# Patient Record
Sex: Female | Born: 1980
Health system: Southern US, Community
[De-identification: ages and names within clinical notes are randomized; demographics above are authoritative.]

## PROBLEM LIST (undated history)

## (undated) DIAGNOSIS — F32A Depression, unspecified: Secondary | ICD-10-CM

## (undated) DIAGNOSIS — E039 Hypothyroidism, unspecified: Secondary | ICD-10-CM

## (undated) DIAGNOSIS — D649 Anemia, unspecified: Secondary | ICD-10-CM

## (undated) DIAGNOSIS — Z86711 Personal history of pulmonary embolism: Secondary | ICD-10-CM

## (undated) DIAGNOSIS — F429 Obsessive-compulsive disorder, unspecified: Secondary | ICD-10-CM

## (undated) DIAGNOSIS — Z862 Personal history of diseases of the blood and blood-forming organs and certain disorders involving the immune mechanism: Secondary | ICD-10-CM

## (undated) DIAGNOSIS — F4310 Post-traumatic stress disorder, unspecified: Secondary | ICD-10-CM

## (undated) DIAGNOSIS — F419 Anxiety disorder, unspecified: Secondary | ICD-10-CM

## (undated) DIAGNOSIS — B019 Varicella without complication: Secondary | ICD-10-CM

## (undated) DIAGNOSIS — R011 Cardiac murmur, unspecified: Secondary | ICD-10-CM

## (undated) DIAGNOSIS — F329 Major depressive disorder, single episode, unspecified: Secondary | ICD-10-CM

## (undated) DIAGNOSIS — F509 Eating disorder, unspecified: Secondary | ICD-10-CM

## (undated) HISTORY — DX: Eating disorder, unspecified: F50.9

## (undated) HISTORY — PX: EXCISION OF ENDOMETRIOMA: SHX6473

## (undated) HISTORY — DX: Post-traumatic stress disorder, unspecified: F43.10

## (undated) HISTORY — DX: Depression, unspecified: F32.A

## (undated) HISTORY — DX: Varicella without complication: B01.9

## (undated) HISTORY — DX: Major depressive disorder, single episode, unspecified: F32.9

## (undated) HISTORY — DX: Personal history of diseases of the blood and blood-forming organs and certain disorders involving the immune mechanism: Z86.2

## (undated) HISTORY — DX: Anxiety disorder, unspecified: F41.9

## (undated) HISTORY — DX: Hypothyroidism, unspecified: E03.9

## (undated) HISTORY — DX: Anemia, unspecified: D64.9

## (undated) HISTORY — DX: Obsessive-compulsive disorder, unspecified: F42.9

## (undated) HISTORY — DX: Personal history of pulmonary embolism: Z86.711

---

## 2000-09-28 DIAGNOSIS — Z86711 Personal history of pulmonary embolism: Secondary | ICD-10-CM

## 2000-09-28 HISTORY — DX: Personal history of pulmonary embolism: Z86.711

## 2001-09-28 HISTORY — PX: CHOLECYSTECTOMY: SHX55

## 2001-09-28 HISTORY — PX: GASTRIC BYPASS: SHX52

## 2014-12-14 DIAGNOSIS — E282 Polycystic ovarian syndrome: Secondary | ICD-10-CM | POA: Insufficient documentation

## 2015-01-08 DIAGNOSIS — Z0000 Encounter for general adult medical examination without abnormal findings: Secondary | ICD-10-CM | POA: Insufficient documentation

## 2016-03-20 ENCOUNTER — Encounter: Payer: Self-pay | Admitting: Family Medicine

## 2016-03-20 ENCOUNTER — Encounter (INDEPENDENT_AMBULATORY_CARE_PROVIDER_SITE_OTHER): Payer: Self-pay

## 2016-03-20 ENCOUNTER — Ambulatory Visit (INDEPENDENT_AMBULATORY_CARE_PROVIDER_SITE_OTHER): Payer: BC Managed Care – PPO | Admitting: Family Medicine

## 2016-03-20 VITALS — BP 126/62 | HR 80 | Temp 98.4°F | Ht 64.0 in

## 2016-03-20 DIAGNOSIS — F509 Eating disorder, unspecified: Secondary | ICD-10-CM | POA: Diagnosis not present

## 2016-03-20 DIAGNOSIS — Z8639 Personal history of other endocrine, nutritional and metabolic disease: Secondary | ICD-10-CM | POA: Insufficient documentation

## 2016-03-20 DIAGNOSIS — Z8659 Personal history of other mental and behavioral disorders: Secondary | ICD-10-CM | POA: Insufficient documentation

## 2016-03-20 DIAGNOSIS — F4310 Post-traumatic stress disorder, unspecified: Secondary | ICD-10-CM

## 2016-03-20 DIAGNOSIS — Z9884 Bariatric surgery status: Secondary | ICD-10-CM | POA: Diagnosis not present

## 2016-03-20 DIAGNOSIS — E039 Hypothyroidism, unspecified: Secondary | ICD-10-CM | POA: Insufficient documentation

## 2016-03-20 DIAGNOSIS — G4700 Insomnia, unspecified: Secondary | ICD-10-CM | POA: Diagnosis not present

## 2016-03-20 LAB — VITAMIN B12: VITAMIN B 12: 185 pg/mL — AB (ref 211–911)

## 2016-03-20 LAB — IBC PANEL
Iron: 48 ug/dL (ref 42–145)
SATURATION RATIOS: 12.3 % — AB (ref 20.0–50.0)
Transferrin: 279 mg/dL (ref 212.0–360.0)

## 2016-03-20 LAB — LIPID PANEL
CHOLESTEROL: 164 mg/dL (ref 0–200)
HDL: 56.9 mg/dL (ref >39.00)
LDL Cholesterol: 95 mg/dL (ref 0–99)
NonHDL: 107.38
TRIGLYCERIDES: 61 mg/dL (ref 0.0–149.0)
Total CHOL/HDL Ratio: 3
VLDL: 12.2 mg/dL (ref 0.0–40.0)

## 2016-03-20 LAB — CBC
HEMATOCRIT: 36.3 % (ref 36.0–46.0)
HEMOGLOBIN: 12.2 g/dL (ref 12.0–15.0)
MCHC: 33.5 g/dL (ref 30.0–36.0)
MCV: 83.6 fl (ref 78.0–100.0)
PLATELETS: 279 10*3/uL (ref 150.0–400.0)
RBC: 4.34 Mil/uL (ref 3.87–5.11)
RDW: 14.1 % (ref 11.5–15.5)
WBC: 6.6 10*3/uL (ref 4.0–10.5)

## 2016-03-20 LAB — COMPREHENSIVE METABOLIC PANEL
ALBUMIN: 4 g/dL (ref 3.5–5.2)
ALT: 10 U/L (ref 0–35)
AST: 11 U/L (ref 0–37)
Alkaline Phosphatase: 58 U/L (ref 39–117)
BUN: 14 mg/dL (ref 6–23)
CALCIUM: 9.1 mg/dL (ref 8.4–10.5)
CHLORIDE: 110 meq/L (ref 96–112)
CO2: 29 mEq/L (ref 19–32)
CREATININE: 0.67 mg/dL (ref 0.40–1.20)
GFR: 105.85 mL/min (ref >60.00)
Glucose, Bld: 87 mg/dL (ref 70–99)
Potassium: 4 mEq/L (ref 3.5–5.1)
Sodium: 143 mEq/L (ref 135–145)
Total Bilirubin: 0.4 mg/dL (ref 0.2–1.2)
Total Protein: 6 g/dL (ref 6.0–8.3)

## 2016-03-20 LAB — T3, FREE: T3, Free: 3.6 pg/mL (ref 2.3–4.2)

## 2016-03-20 LAB — T4, FREE: Free T4: 0.84 ng/dL (ref 0.60–1.60)

## 2016-03-20 LAB — FOLATE: Folate: >23.7 ng/mL (ref >5.9)

## 2016-03-20 LAB — VITAMIN D 25 HYDROXY (VIT D DEFICIENCY, FRACTURES): VITD: 15.67 ng/mL — AB (ref 30.00–100.00)

## 2016-03-20 LAB — FERRITIN: Ferritin: 7.9 ng/mL — ABNORMAL LOW (ref 10.0–291.0)

## 2016-03-20 LAB — HEMOGLOBIN A1C: HEMOGLOBIN A1C: 5.3 % (ref 4.6–6.5)

## 2016-03-20 LAB — TSH: TSH: 9.85 u[IU]/mL — AB (ref 0.35–4.50)

## 2016-03-20 MED ORDER — SERTRALINE HCL 100 MG PO TABS: 300.0000 mg | ORAL_TABLET | Freq: Every day | ORAL | Status: DC

## 2016-03-20 MED ORDER — TRAZODONE HCL 50 MG PO TABS: 50.0000 mg | ORAL_TABLET | Freq: Every evening | ORAL | Status: DC | PRN

## 2016-03-20 MED ORDER — CLONAZEPAM 0.5 MG PO TABS: 0.5000 mg | ORAL_TABLET | Freq: Two times a day (BID) | ORAL | Status: DC | PRN

## 2016-03-20 NOTE — Assessment & Plan Note (Signed)
Unclear diagnosis. Patient appears to have had issues with restrictive eating, binge eating, and binging and purging previously. She refused weight today. Patient was placed on Zoloft 300 mg daily by a psychiatrist.  She feels like she is doing well at this time on the Zoloft. I discussed seeing a nutritionist who has a great deal clinical experience with eating disorders. She declined this time. She will continue to see her therapist.
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
## 2016-03-20 NOTE — Patient Instructions (Signed)
Take the medications as we discussed.  We will call regarding your labs.  Follow up in 3 months.  Take care  Dr. Adriana Simasook

## 2016-03-20 NOTE — Assessment & Plan Note (Signed)
Likely secondary to anxiety/PTSD. Starting patient on trazodone.

## 2016-03-20 NOTE — Assessment & Plan Note (Signed)
Patient having considerable anxiety this point in time. Adding Klonopin 0.5 mg twice a day PRN.

## 2016-03-21 LAB — IRON AND TIBC
%SAT: 11 % (ref 11–50)
Iron: 38 ug/dL — ABNORMAL LOW (ref 40–190)
TIBC: 332 ug/dL (ref 250–450)
UIBC: 294 ug/dL (ref 125–400)

## 2016-03-23 ENCOUNTER — Other Ambulatory Visit: Payer: Self-pay

## 2016-03-23 ENCOUNTER — Other Ambulatory Visit: Payer: Self-pay | Admitting: Family Medicine

## 2016-03-23 DIAGNOSIS — D509 Iron deficiency anemia, unspecified: Secondary | ICD-10-CM

## 2016-03-23 MED ORDER — VITAMIN D (ERGOCALCIFEROL) 1.25 MG (50000 UNIT) PO CAPS: 50000.0000 [IU] | ORAL_CAPSULE | ORAL | Status: DC

## 2016-03-23 MED ORDER — FERROUS SULFATE 324 (65 FE) MG PO TBEC: DELAYED_RELEASE_TABLET | ORAL | Status: DC

## 2016-03-24 ENCOUNTER — Encounter: Payer: Self-pay | Admitting: Family Medicine

## 2016-03-24 LAB — VITAMIN B1: Vitamin B1 (Thiamine): 8 nmol/L (ref 8–30)

## 2016-03-25 NOTE — Telephone Encounter (Signed)
Hematology is scheduled through oncology at the cancer center.

## 2016-03-25 NOTE — Telephone Encounter (Signed)
Have you seen this come across. i know the Oncology one is in there but have you seen hematology?

## 2016-04-06 ENCOUNTER — Ambulatory Visit (INDEPENDENT_AMBULATORY_CARE_PROVIDER_SITE_OTHER): Payer: BC Managed Care – PPO | Admitting: Family Medicine

## 2016-04-06 ENCOUNTER — Encounter: Payer: Self-pay | Admitting: Family Medicine

## 2016-04-06 VITALS — BP 130/82 | HR 73 | Temp 98.5°F

## 2016-04-06 DIAGNOSIS — E538 Deficiency of other specified B group vitamins: Secondary | ICD-10-CM

## 2016-04-06 DIAGNOSIS — E860 Dehydration: Secondary | ICD-10-CM | POA: Insufficient documentation

## 2016-04-06 MED ORDER — ONDANSETRON HCL 4 MG PO TABS: 4.0000 mg | ORAL_TABLET | Freq: Three times a day (TID) | ORAL | Status: DC | PRN

## 2016-04-06 NOTE — Patient Instructions (Signed)
Hydrate, hydrate, hydrate.  Use the zofran for nausea.  Your exam was normal.  Follow up as directed or sooner if needed  Take care  Dr. Adriana Simasook

## 2016-04-06 NOTE — Progress Notes (Signed)
   Subjective:  Patient ID: Enrigue CatenaKristen Heindl, female    DOB: 30-Nov-1980  Age: 35 y.o. MRN: [id number]  CC: Neck pain, Sore throat, Body aches, Diarrhea, Nausea  HPI:  35 year old female with a past medical history of eating disorder presents with the above complaints. Patient states that her symptoms started on Thursday. Initially began with neck pain. She has now developed nausea, diarrhea, body aches, sore throat/swollen glands. No associated fevers or chills. No URI symptoms. No known exacerbating or relieving factors. No reported sick contacts. No other complaints at this time.  Social Hx   Social History   Social History  . Marital Status: Single    Spouse Name: N/A  . Number of Children: N/A  . Years of Education: N/A   Social History Main Topics  . Smoking status: Never Smoker   . Smokeless tobacco: Never Used  . Alcohol Use: 0.0 - 0.6 oz/week    0-1 Standard drinks or equivalent per week  . Drug Use: No  . Sexual Activity: Not Asked   Other Topics Concern  . None   Social History Narrative   Review of Systems  Constitutional: Negative for fever.  Gastrointestinal: Positive for nausea and diarrhea.  Musculoskeletal: Positive for myalgias and neck pain.   Objective:  BP 130/82 mmHg  Pulse 73  Temp(Src) 98.5 F (36.9 C) (Oral)  SpO2 97% Weight taken today (not reported as this is visible to patient and she has issues with weight/eating disorder).  BP/Weight 04/06/2016 03/20/2016  Systolic BP 130 126  Diastolic BP 82 62  Wt. (Lbs) - -   Physical Exam  Constitutional: She appears well-developed. No distress.  Appears fatigued.  HENT:  Dry mucous membranes.  Cardiovascular: Normal rate and regular rhythm.   Pulmonary/Chest: Effort normal. She has no wheezes. She has no rales.  Abdominal: Soft. She exhibits no distension. There is no tenderness.  Vitals reviewed.  Lab Results  Component Value Date   WBC 6.6 03/20/2016   HGB 12.2 03/20/2016   HCT 36.3  03/20/2016   PLT 279.0 03/20/2016   GLUCOSE 87 03/20/2016   CHOL 164 03/20/2016   TRIG 61.0 03/20/2016   HDL 56.90 03/20/2016   LDLCALC 95 03/20/2016   ALT 10 03/20/2016   AST 11 03/20/2016   NA 143 03/20/2016   K 4.0 03/20/2016   CL 110 03/20/2016   CREATININE 0.67 03/20/2016   BUN 14 03/20/2016   CO2 29 03/20/2016   TSH 9.85* 03/20/2016   HGBA1C 5.3 03/20/2016   Assessment & Plan:   Problem List Items Addressed This Visit    Dehydration - Primary    New problem. Patient with recent illness (several complaints). Most recently experiencing diarrhea and nausea. Clinically dehydrated today. Other than dry mucous membranes exam unrevealing. BMP today. Supportive care. Zofran for nausea. Increase fluid intake.      Relevant Orders   Basic Metabolic Panel (BMET)      Follow-up: PRN  Everlene OtherJayce Khadejah Son DO [facility]

## 2016-04-06 NOTE — Assessment & Plan Note (Signed)
New problem. Patient with recent illness (several complaints). Most recently experiencing diarrhea and nausea. Clinically dehydrated today. Other than dry mucous membranes exam unrevealing. BMP today. Supportive care. Zofran for nausea. Increase fluid intake.

## 2016-04-06 NOTE — Progress Notes (Signed)
Pre visit review using our clinic review tool, if applicable. No additional management support is needed unless otherwise documented below in the visit note. 

## 2016-04-07 DIAGNOSIS — E538 Deficiency of other specified B group vitamins: Secondary | ICD-10-CM | POA: Diagnosis not present

## 2016-04-07 LAB — BASIC METABOLIC PANEL
BUN: 15 mg/dL (ref 6–23)
CO2: 27 mEq/L (ref 19–32)
CREATININE: 0.66 mg/dL (ref 0.40–1.20)
Calcium: 9.6 mg/dL (ref 8.4–10.5)
Chloride: 106 mEq/L (ref 96–112)
GFR: 107.67 mL/min (ref >60.00)
Glucose, Bld: 82 mg/dL (ref 70–99)
POTASSIUM: 4.4 meq/L (ref 3.5–5.1)
Sodium: 140 mEq/L (ref 135–145)

## 2016-04-07 MED ORDER — CYANOCOBALAMIN 1000 MCG/ML IJ SOLN
1000.0000 ug | Freq: Once | INTRAMUSCULAR | Status: AC
Start: 2016-04-07 — End: 2016-04-07
  Administered 2016-04-07: 1000 ug via INTRAMUSCULAR

## 2016-04-07 NOTE — Addendum Note (Signed)
Addended by: Jennelle HumanNEWTON, ASHLEIGH E on: 04/07/2016 08:11 AM   Modules accepted: Orders

## 2016-04-08 ENCOUNTER — Encounter: Payer: Self-pay | Admitting: Hematology and Oncology

## 2016-04-08 ENCOUNTER — Inpatient Hospital Stay: Payer: BC Managed Care – PPO

## 2016-04-08 ENCOUNTER — Other Ambulatory Visit: Payer: Self-pay | Admitting: Hematology and Oncology

## 2016-04-08 ENCOUNTER — Inpatient Hospital Stay: Payer: BC Managed Care – PPO | Attending: Hematology and Oncology | Admitting: Hematology and Oncology

## 2016-04-08 VITALS — BP 127/80 | HR 73 | Temp 97.9°F | Resp 18 | Ht 64.0 in

## 2016-04-08 DIAGNOSIS — R0602 Shortness of breath: Secondary | ICD-10-CM | POA: Diagnosis not present

## 2016-04-08 DIAGNOSIS — E538 Deficiency of other specified B group vitamins: Secondary | ICD-10-CM | POA: Insufficient documentation

## 2016-04-08 DIAGNOSIS — Z79899 Other long term (current) drug therapy: Secondary | ICD-10-CM | POA: Insufficient documentation

## 2016-04-08 DIAGNOSIS — N920 Excessive and frequent menstruation with regular cycle: Secondary | ICD-10-CM

## 2016-04-08 DIAGNOSIS — Z86711 Personal history of pulmonary embolism: Secondary | ICD-10-CM | POA: Diagnosis not present

## 2016-04-08 DIAGNOSIS — D509 Iron deficiency anemia, unspecified: Secondary | ICD-10-CM | POA: Insufficient documentation

## 2016-04-08 DIAGNOSIS — Z8042 Family history of malignant neoplasm of prostate: Secondary | ICD-10-CM | POA: Diagnosis not present

## 2016-04-08 DIAGNOSIS — R42 Dizziness and giddiness: Secondary | ICD-10-CM | POA: Diagnosis not present

## 2016-04-08 DIAGNOSIS — F329 Major depressive disorder, single episode, unspecified: Secondary | ICD-10-CM

## 2016-04-08 DIAGNOSIS — Z9884 Bariatric surgery status: Secondary | ICD-10-CM | POA: Diagnosis not present

## 2016-04-08 DIAGNOSIS — R5383 Other fatigue: Secondary | ICD-10-CM | POA: Diagnosis not present

## 2016-04-08 DIAGNOSIS — E039 Hypothyroidism, unspecified: Secondary | ICD-10-CM | POA: Diagnosis not present

## 2016-04-08 DIAGNOSIS — R233 Spontaneous ecchymoses: Secondary | ICD-10-CM

## 2016-04-08 DIAGNOSIS — R238 Other skin changes: Secondary | ICD-10-CM

## 2016-04-08 LAB — CBC WITH DIFFERENTIAL/PLATELET
Basophils Absolute: 0.1 10*3/uL (ref 0–0.1)
Basophils Relative: 1 %
Eosinophils Absolute: 0.2 10*3/uL (ref 0–0.7)
Eosinophils Relative: 3 %
HCT: 38.7 % (ref 35.0–47.0)
Hemoglobin: 12.9 g/dL (ref 12.0–16.0)
Lymphocytes Relative: 26 %
Lymphs Abs: 2.3 10*3/uL (ref 1.0–3.6)
MCH: 27.8 pg (ref 26.0–34.0)
MCHC: 33.2 g/dL (ref 32.0–36.0)
MCV: 83.7 fL (ref 80.0–100.0)
Monocytes Absolute: 0.6 10*3/uL (ref 0.2–0.9)
Monocytes Relative: 6 %
Neutro Abs: 5.8 10*3/uL (ref 1.4–6.5)
Neutrophils Relative %: 64 %
Platelets: 291 10*3/uL (ref 150–440)
RBC: 4.62 MIL/uL (ref 3.80–5.20)
RDW: 13.8 % (ref 11.5–14.5)
WBC: 8.9 10*3/uL (ref 3.6–11.0)

## 2016-04-08 NOTE — Progress Notes (Signed)
[facility]  Clinic day:  04/08/2016  Chief Complaint: Sharon Gibson is a 35 y.o. female s/p gastric bypass surgery with subsequent B12 and iron deficiency who is referred by Dr. Everlene OtherJayce Cook for assessment and management.  HPI:  The patient underwent gastric bypass surgery in 2004 while [place] in [place].  She states that she has received IV iron on 5 occasions secondary to documented iron deficiency.  She first [place] in [place] in [place].  She states that she received an 8-12 hour infusion.  She experienced a reaction and her infusion was stopped then started back slowly.  She received Benadryl.    She states that she has received IV iron every 2 years.  While living in [place], she received another type [facility].  She then received another IV iron infusion in [facility] in [place].  She describes receiving weekly infusions, 1 week apart.  She again experienced a reaction.  With the second infusion, she received Benadryl and steroids.  She can typically tell when she needs IV iron.  She notes being exhausted, like "gravity pulling stronger".  She also notes shortness of breath with exertion, word finding problems, trouble focusing and dizziness.  She is experiencing those symptoms now.   Regarding her diet, she does not eat much iron rich foods.  She has tried oral iron in the past and it "didn'tworked".  She also notes nausea with oral iron.  She denies any current ice cravings.  Labs on 03/20/2016 revealed a hematocrit of 36.3, hemoglobin 12.2, MCV 83.6, platelets 279,000, and WBC 6600.  Creatinine was 0.67.  TSH was 9.85.  Ferritin was 7.9 (low).  Iron studies revealed a saturation of 11-12%.  TIBC was 332 (normal).  B12 was 185 (211-911).  She notes that she should be receiving B12 injections.  She received her first recent B12 injection on 04/07/2016.  She notes a history of multiple (9) pulmonary  emboli in September or October 2003.  Pulmonary emboli occurred before her gastric bypass surgery.  She describes slight decrease in activity level.  Weight was 280 pounds.  She denied use of birth control pills; she was on NuvaRing.  She denies smoking.  She notes a history of heavy menses.  She currently has an IUD.  Menses are described as "less consistent" and not heavy following her IUD.   She also notes easy bruisability at times not associated with aspirin or ibuprofen use.   Past Medical History  Diagnosis Date  . Chicken pox   . Depression   . Eating disorder     Has had residential treatment and hospitalization previously.  . Hypothyroidism   . History of pulmonary embolus (PE)   . History of anemia     Past Surgical History  Procedure Laterality Date  . Cholecystectomy  2004  . Gastric bypass      Family History  Problem Relation Age of Onset  . Hypertension Mother   . Hypertension Father   . Mental retardation Father   . Diabetes Father   . Prostate cancer Paternal Grandfather     Social History:  reports that she has never smoked. She has never used smokeless tobacco. She reports that she drinks alcohol. She reports that she does not use illicit drugs.  Sh previously lived in [place], moved to [place], [place] (Consulting [place] then [place]  She lives in [place], [place].  She works as  a Product manager at [employer]  The patient is alone today.  Allergies:  Allergies  Allergen Reactions  . Penicillins Hives  . Azithromycin   . Other     Narcotics- Burning in the stomach (whether PO or IV)    Current Medications: Current Outpatient Prescriptions  Medication Sig Dispense Refill  . clonazePAM (KLONOPIN) 0.5 MG tablet Take 1 tablet (0.5 mg total) by mouth 2 (two) times daily as needed for anxiety. 60 tablet 0  . diphenhydrAMINE (BENADRYL) 50 MG tablet Take 50 mg by mouth at bedtime as needed for itching.    . Multiple Vitamins-Minerals (HAIR SKIN  AND NAILS FORMULA PO) Take by mouth.    . sertraline (ZOLOFT) 100 MG tablet Take 3 tablets (300 mg total) by mouth daily. 270 tablet 1  . traZODone (DESYREL) 50 MG tablet Take 1-2 tablets (50-100 mg total) by mouth at bedtime as needed for sleep. 90 tablet 0  . Vitamin D, Ergocalciferol, (DRISDOL) 50000 units CAPS capsule Take 1 capsule (50,000 Units total) by mouth every 7 (seven) days. 12 capsule 0  . ondansetron (ZOFRAN) 4 MG tablet Take 1 tablet (4 mg total) by mouth every 8 (eight) hours as needed for nausea or vomiting. (Patient not taking: Reported on 04/08/2016) 20 tablet 0   No current facility-administered medications for this visit.    Review of Systems:  GENERAL:  Fatigue.  Exhaustion.  No fevers, sweats or weight loss. PERFORMANCE STATUS (ECOG):  1 HEENT:  No visual changes, runny nose, sore throat, mouth sores or tenderness. Lungs: Shortness of breath.  No cough.  No hemoptysis. Cardiac:  No chest pain, palpitations, orthopnea, or PND. GI:  No nausea, vomiting, diarrhea, constipation, melena or hematochezia. GU:  No urgency, frequency, dysuria, or hematuria.  Heavy menses prior to IUD. Musculoskeletal:  No back pain.  No joint pain.  No muscle tenderness. Extremities:  No pain or swelling. Skin:  Bruising on legs (unexplained).  No rashes or skin changes. Neuro:  Poor sleep, on Trazodone.  Word finding issues and trouble focusing felt secondary to iron deficiency.  No headache, numbness or weakness, balance or coordination issues. Endocrine:  No diabetes, thyroid issues, hot flashes or night sweats. Psych:  No mood changes, depression or anxiety. Pain:  No focal pain. Review of systems:  All other systems reviewed and found to be negative.  Physical Exam: Blood pressure 127/80, pulse 73, temperature 97.9 F (36.6 C), temperature source Tympanic, resp. rate 18, height  (1.626 m). GENERAL:  Well developed, well nourished, heavyset woman sitting comfortably in the exam  room in no acute distress. MENTAL STATUS:  Alert and oriented to person, place and time. HEAD:  Red hair pulled up.  Normocephalic, atraumatic, face symmetric, no Cushingoid features. EYES:  Glasses.  Blue eyes.  Pupils equal round and reactive to light and accomodation.  No conjunctivitis or scleral icterus. ENT:  Oropharynx clear without lesion.  Tongue normal. Mucous membranes moist.  RESPIRATORY:  Clear to auscultation without rales, wheezes or rhonchi. CARDIOVASCULAR:  Regular rate and rhythm without murmur, rub or gallop. ABDOMEN:  Fully round.  Soft, non-tender, with active bowel sounds, and no appreciable hepatosplenomegaly.  No masses. SKIN:  Few small scattered lower extremity bruises.  No petechiae.  No rashes, ulcers or lesions. EXTREMITIES:  Chronic lower extremity changes.  No skin discoloration or tenderness.  No palpable cords. LYMPH NODES: No palpable cervical, supraclavicular, axillary or inguinal adenopathy  NEUROLOGICAL: Unremarkable. PSYCH:  Appropriate.  Appointment on 04/08/2016  Component Date Value Ref Range Status  . WBC 04/08/2016 8.9  3.6 - 11.0 K/uL Final  . RBC 04/08/2016 4.62  3.80 - 5.20 MIL/uL Final  . Hemoglobin 04/08/2016 12.9  12.0 - 16.0 g/dL Final  . HCT 16/06/9603 38.7  35.0 - 47.0 % Final  . MCV 04/08/2016 83.7  80.0 - 100.0 fL Final  . MCH 04/08/2016 27.8  26.0 - 34.0 pg Final  . MCHC 04/08/2016 33.2  32.0 - 36.0 g/dL Final  . RDW 54/05/8118 13.8  11.5 - 14.5 % Final  . Platelets 04/08/2016 291  150 - 440 K/uL Final  . Neutrophils Relative % 04/08/2016 64   Final  . Neutro Abs 04/08/2016 5.8  1.4 - 6.5 K/uL Final  . Lymphocytes Relative 04/08/2016 26   Final  . Lymphs Abs 04/08/2016 2.3  1.0 - 3.6 K/uL Final  . Monocytes Relative 04/08/2016 6   Final  . Monocytes Absolute 04/08/2016 0.6  0.2 - 0.9 K/uL Final  . Eosinophils Relative 04/08/2016 3   Final  . Eosinophils Absolute 04/08/2016 0.2  0 - 0.7 K/uL Final  . Basophils Relative 04/08/2016  1   Final  . Basophils Absolute 04/08/2016 0.1  0 - 0.1 K/uL Final  Office Visit on 04/06/2016  Component Date Value Ref Range Status  . Sodium 04/06/2016 140  135 - 145 mEq/L Final  . Potassium 04/06/2016 4.4  3.5 - 5.1 mEq/L Final  . Chloride 04/06/2016 106  96 - 112 mEq/L Final  . CO2 04/06/2016 27  19 - 32 mEq/L Final  . Glucose, Bld 04/06/2016 82  70 - 99 mg/dL Final  . BUN 14/78/2956 15  6 - 23 mg/dL Final  . Creatinine, Ser 04/06/2016 0.66  0.40 - 1.20 mg/dL Final  . Calcium 21/30/8657 9.6  8.4 - 10.5 mg/dL Final  . GFR 84/69/6295 107.67  >60.00 mL/min Final    Assessment:  Sharon Gibson is a 35 y.o. female s/p gastric bypass surgery (2004) with subsequent B12 and iron deficiency.  She has received IV iron (different preparations) x 5 ([place], [place]; [place], [place];  [place], [place]) with reactions requiring Benadryl and steroids.  Labs on 03/20/2016 revealed a normal hematocrit but iron deficiency (ferritin 8).  B12 was 185 (low).  She began B12 on 04/07/2016.  She has a history of multiple pulmonary emboli in 2003.  Per patient report, hypercoagulable work-up was negative.  She was on Coumadin x 6 months.  She has a history of easy bruising and heavy menses.  She has an IUD.  Symptomatically, she is fatigued and short of breath.  She has word finding difficulty and trouble focusing felt secondary to iron deficiency.  She does not feel refreshed when she wakes up in the morning (? sleep apnea).  Plan: 1. Labs today:  CBC with diff, ferritin, iron studies, B12, folate. 2. Dorothe Pea.  (Patient would like a limited number of infusions rather than weekly Venofer to correct iron deficiency). 3. Release of information for prior documentation and management of IV iron reactions [facility] [phone], [place] [phone]; [place] Gess [phone]). 4. Continue B12 supplementation:  weekly x 6 total then monthly. 5. Consider overnight oximetry/sleep apnea  testing. 6. Discuss life long anticoagulation if develops another clot (DVT or PE). 7. RTC for IV iron. 8. RTC for testing (von Willebrand panel, multimers, platelet function assay) to assess easy bruising and heavy menses when patient notes excess bruising. 9. RTC every 3 months for labs (CBC with diff, ferritin).  10. RTC in 6 months for MD assessment and labs (CBC with diff, ferritin, iron studies).   Rosey Bath, MD  04/08/2016, 3:51 PM

## 2016-04-08 NOTE — Progress Notes (Signed)
Patient is here for new evaluation, patient is weak and dizzy, she had a virus over the weekend. This will be patient 4th iron infusion

## 2016-04-09 ENCOUNTER — Other Ambulatory Visit: Payer: Self-pay | Admitting: Hematology and Oncology

## 2016-04-09 ENCOUNTER — Other Ambulatory Visit: Payer: Self-pay | Admitting: *Deleted

## 2016-04-09 DIAGNOSIS — D509 Iron deficiency anemia, unspecified: Secondary | ICD-10-CM

## 2016-04-09 DIAGNOSIS — Z9884 Bariatric surgery status: Secondary | ICD-10-CM

## 2016-04-09 LAB — IRON AND TIBC
Iron: 28 ug/dL (ref 28–170)
Saturation Ratios: 7 % — ABNORMAL LOW (ref 10.4–31.8)
TIBC: 412 ug/dL (ref 250–450)
UIBC: 384 ug/dL

## 2016-04-09 LAB — FOLATE: Folate: 31 ng/mL (ref >5.9)

## 2016-04-09 LAB — VITAMIN B12: Vitamin B-12: 531 pg/mL (ref 180–914)

## 2016-04-09 LAB — FERRITIN: Ferritin: 8 ng/mL — ABNORMAL LOW (ref 11–307)

## 2016-04-15 ENCOUNTER — Telehealth: Payer: Self-pay | Admitting: Hematology and Oncology

## 2016-04-15 NOTE — Telephone Encounter (Signed)
Pt lvm wanting to know status of iron infusion/whether you received the paperwork needed. Stated that she was having paperwork from several places faxed to us to facilitate scheduling of iron infusion. Please advise. [phone]

## 2016-04-15 NOTE — Telephone Encounter (Signed)
Called pt back and got her voicemail on the phone number left of file and left her a message that [facility] got info and it was [facility] that she took.  [facility] [place] [facility]. When I called the person that looked into could only find eR visits and was sent to med rec dept in which we wrote letter and faxed to their office.  The [place] office  Sent me to hematology and they sent me to their med. Rec. Office in which I had to write letter and fax request and we area waiting to hear back.  She can call if any questions

## 2016-04-20 ENCOUNTER — Encounter: Payer: Self-pay | Admitting: Hematology and Oncology

## 2016-04-20 NOTE — Telephone Encounter (Signed)
Called pt and go there voicemail and explained that dr Merlene Pulling wants to wait until she gets all the info due to her infusion reactions from iron treatments.  [facility] in which [facility] still had reactions even with premeds from feraheme although the reaction was more mild with premeds.  Then at [facility] she got imferon three time and the first one she had reaction and was given meds and got better and the 2nd and 3rd time had premeds and had no reaction.  Dr Merlene Pulling checking into that med because she has never heard of it.  Her ferritin is low but she is not IDA which means hgb is normal and when the hct is low and the rbc size is low it means she is iron def. Anemia but in her case all the numbers are normal but the ferritin.  And because it is so important to make sure we get all the info back before making choice of what product to give.  Hopefully we may have all info by Friday but I will call tom. And check if I can speak to someone at [facility].  If she has questions she is welcome to call me back

## 2016-04-20 NOTE — Telephone Encounter (Signed)
-----   Message from Collene Leyden sent at 04/20/2016  3:22 PM EDT ----- Regarding: Follow up on requested documentation Please give pt a call. She would like to know if you all received the documentation that was requested so she can start her treatment.  Thank you Nehemiah Settle

## 2016-04-21 ENCOUNTER — Encounter: Payer: Self-pay | Admitting: Family Medicine

## 2016-04-22 ENCOUNTER — Encounter: Payer: Self-pay | Admitting: Family Medicine

## 2016-04-24 NOTE — Telephone Encounter (Signed)
Pt dropped off a documentation of disability form to be filled out.. Placed in Dr. Shiela Mayer [place] Si folder) up front.Marland Kitchen

## 2016-04-27 ENCOUNTER — Encounter: Payer: Self-pay | Admitting: Family Medicine

## 2016-04-27 ENCOUNTER — Other Ambulatory Visit: Payer: Self-pay | Admitting: Family Medicine

## 2016-04-27 MED ORDER — SERTRALINE HCL 100 MG PO TABS: 200.0000 mg | ORAL_TABLET | Freq: Every day | ORAL | 1 refills | Status: DC

## 2016-04-27 MED ORDER — CLONAZEPAM 0.5 MG PO TABS: 0.5000 mg | ORAL_TABLET | Freq: Two times a day (BID) | ORAL | 0 refills | Status: DC | PRN

## 2016-04-27 NOTE — Telephone Encounter (Signed)
Last refill 03/20/16 #60 Last office visit 04/06/16

## 2016-04-27 NOTE — Telephone Encounter (Signed)
Rx called to pharmacy as instructed. 

## 2016-04-29 ENCOUNTER — Ambulatory Visit (INDEPENDENT_AMBULATORY_CARE_PROVIDER_SITE_OTHER): Payer: BC Managed Care – PPO

## 2016-04-29 DIAGNOSIS — E538 Deficiency of other specified B group vitamins: Secondary | ICD-10-CM

## 2016-04-29 MED ORDER — CYANOCOBALAMIN 1000 MCG/ML IJ SOLN
1000.0000 ug | Freq: Once | INTRAMUSCULAR | Status: AC
  Administered 2016-04-29: 1000 ug via INTRAMUSCULAR

## 2016-04-29 NOTE — Progress Notes (Signed)
Patient presented for 2 out of 6 B12 injections, per Dr. Patsey Berthold order. Administered R deltoid.  Tolerated well.  Requests to administer ongoing injections at home.  PCP made aware.  Medication called in to pharmacy per CMA.

## 2016-05-08 ENCOUNTER — Telehealth: Payer: Self-pay | Admitting: *Deleted

## 2016-05-08 ENCOUNTER — Other Ambulatory Visit: Payer: Self-pay | Admitting: Hematology and Oncology

## 2016-05-08 NOTE — Telephone Encounter (Signed)
Pt has been waiting on us to get records from 3 facilities about her iron product she had at each place and dosage and what kind of reaction she had.  Per the pt when she came the first visit she had reaction every time she got meds in each facility.  After reviewing records she did well with premeds with a drug called imferon but when md looked into the drug is no longer available and not made any longer.  [place] did have venofer in [place] and there was no mention of reaction in the info sent.  Merlene PullingCorcoran wants to give her 200 mg venofer iv with premeds and if she does well then future doses when pt needs it she will try backin goff premeds one at a time to see how pt does.  We are waiting for ins. Approval and believe it will be ok to come next wed 8/16 9:30 in [place] office because of her reaction hx in the past.   and she wanted to have it done before her parents left out of state and they are here all next week.  I have told pt to call back to triage if she has questions because I am leaving for the day.

## 2016-05-13 ENCOUNTER — Inpatient Hospital Stay: Payer: BC Managed Care – PPO | Attending: Hematology and Oncology

## 2016-05-13 VITALS — BP 97/57 | HR 57 | Temp 98.1°F | Resp 18

## 2016-05-13 DIAGNOSIS — D509 Iron deficiency anemia, unspecified: Secondary | ICD-10-CM | POA: Insufficient documentation

## 2016-05-13 MED ORDER — ACETAMINOPHEN 325 MG PO TABS
650.0000 mg | ORAL_TABLET | Freq: Once | ORAL | Status: AC
  Administered 2016-05-13: 650 mg via ORAL
  Filled 2016-05-13: qty 2

## 2016-05-13 MED ORDER — SODIUM CHLORIDE 0.9 % IV SOLN
10.0000 mg | Freq: Once | INTRAVENOUS | Status: AC
  Administered 2016-05-13: 10 mg via INTRAVENOUS
  Filled 2016-05-13: qty 1

## 2016-05-13 MED ORDER — SODIUM CHLORIDE 0.9 % IV SOLN
200.0000 mg | Freq: Once | INTRAVENOUS | Status: AC
  Administered 2016-05-13: 200 mg via INTRAVENOUS
  Filled 2016-05-13: qty 10

## 2016-05-13 MED ORDER — SODIUM CHLORIDE 0.9 % IV SOLN
Freq: Once | INTRAVENOUS | Status: AC
  Administered 2016-05-13: 10:00:00 via INTRAVENOUS
  Filled 2016-05-13: qty 1000

## 2016-05-13 MED ORDER — DIPHENHYDRAMINE HCL 25 MG PO CAPS
50.0000 mg | ORAL_CAPSULE | Freq: Once | ORAL | Status: AC
  Administered 2016-05-13: 50 mg via ORAL
  Filled 2016-05-13: qty 2

## 2016-05-14 ENCOUNTER — Encounter: Payer: Self-pay | Admitting: Hematology and Oncology

## 2016-05-18 NOTE — Telephone Encounter (Signed)
Got a message from corcoran to talk with pt and make sure she is feeling better and if she experienced the same side effects when she was in clinic in [place] because the notes from MD said she got the venofer and he had no notation that she had side effects, his next note described her tolerating it well.  I have asked her to call me back and left my ascom number.

## 2016-05-20 ENCOUNTER — Telehealth: Payer: Self-pay | Admitting: Family Medicine

## 2016-05-20 NOTE — Telephone Encounter (Signed)
Will await lab documentation.

## 2016-05-20 NOTE — Telephone Encounter (Signed)
Pt stated she had multiple labs that she need and is going to bring by the list of labs needed. She stated that if they could be completed before Friday she'd appreciate that. Labs will be given to PCP to review before ordering.

## 2016-05-20 NOTE — Telephone Encounter (Signed)
That is fine. Will place lab orders in the am.

## 2016-05-20 NOTE — Telephone Encounter (Signed)
Pt would like to have certain labs done because she is going to a eating disorder program.. Please advise pt

## 2016-05-20 NOTE — Telephone Encounter (Signed)
Patient needs PPD with her B12 injection. Is this okay?

## 2016-05-21 ENCOUNTER — Other Ambulatory Visit: Payer: Self-pay

## 2016-05-21 ENCOUNTER — Other Ambulatory Visit: Payer: Self-pay | Admitting: Family Medicine

## 2016-05-21 ENCOUNTER — Other Ambulatory Visit: Payer: BC Managed Care – PPO

## 2016-05-21 DIAGNOSIS — F509 Eating disorder, unspecified: Secondary | ICD-10-CM

## 2016-05-21 NOTE — Telephone Encounter (Signed)
Needs orders placed by 230 p.m.

## 2016-05-22 ENCOUNTER — Telehealth: Payer: Self-pay | Admitting: *Deleted

## 2016-05-22 NOTE — Telephone Encounter (Signed)
Called pt again but got her voicemail. Trying to see if she feels better from venofer over a  Week ago and how long it took her to feel better and what the plan will have to be for the next time she gets any kind of iron treatment

## 2016-05-26 ENCOUNTER — Telehealth: Payer: Self-pay | Admitting: *Deleted

## 2016-05-26 NOTE — Telephone Encounter (Signed)
Called pt and got her voicemail, trying to check on pt about her sx she had after venofer treatment and what we can do differently the next time she needs anything. Await her call back

## 2016-06-12 ENCOUNTER — Emergency Department: Payer: BC Managed Care – PPO

## 2016-06-12 ENCOUNTER — Encounter: Payer: Self-pay | Admitting: Emergency Medicine

## 2016-06-12 ENCOUNTER — Emergency Department
Admission: EM | Admit: 2016-06-12 | Discharge: 2016-06-12 | Disposition: A | Discharge status: Home / Self Care | Payer: BC Managed Care – PPO | Attending: Emergency Medicine | Admitting: Emergency Medicine

## 2016-06-12 DIAGNOSIS — Z79899 Other long term (current) drug therapy: Secondary | ICD-10-CM | POA: Insufficient documentation

## 2016-06-12 DIAGNOSIS — R0789 Other chest pain: Secondary | ICD-10-CM | POA: Insufficient documentation

## 2016-06-12 DIAGNOSIS — F419 Anxiety disorder, unspecified: Secondary | ICD-10-CM

## 2016-06-12 DIAGNOSIS — E039 Hypothyroidism, unspecified: Secondary | ICD-10-CM | POA: Insufficient documentation

## 2016-06-12 DIAGNOSIS — R079 Chest pain, unspecified: Secondary | ICD-10-CM

## 2016-06-12 LAB — CBC
HEMATOCRIT: 37.6 % (ref 35.0–47.0)
HEMOGLOBIN: 13 g/dL (ref 12.0–16.0)
MCH: 29.1 pg (ref 26.0–34.0)
MCHC: 34.5 g/dL (ref 32.0–36.0)
MCV: 84.4 fL (ref 80.0–100.0)
PLATELETS: 230 10*3/uL (ref 150–440)
RBC: 4.45 MIL/uL (ref 3.80–5.20)
RDW: 15 % — ABNORMAL HIGH (ref 11.5–14.5)
WBC: 9 10*3/uL (ref 3.6–11.0)

## 2016-06-12 LAB — BASIC METABOLIC PANEL
ANION GAP: 6 (ref 5–15)
BUN: 16 mg/dL (ref 6–20)
CHLORIDE: 107 mmol/L (ref 101–111)
CO2: 25 mmol/L (ref 22–32)
CREATININE: 0.63 mg/dL (ref 0.44–1.00)
Calcium: 8.8 mg/dL — ABNORMAL LOW (ref 8.9–10.3)
GFR calc non Af Amer: >60 mL/min (ref >60)
Glucose, Bld: 88 mg/dL (ref 65–99)
POTASSIUM: 4.6 mmol/L (ref 3.5–5.1)
SODIUM: 138 mmol/L (ref 135–145)

## 2016-06-12 LAB — FIBRIN DERIVATIVES D-DIMER (ARMC ONLY): Fibrin derivatives D-dimer (ARMC): 425 (ref 0–499)

## 2016-06-12 LAB — TROPONIN I: Troponin I: <0.03 ng/mL (ref <0.03)

## 2016-06-12 MED ORDER — CYCLOBENZAPRINE HCL 10 MG PO TABS: 10.0000 mg | ORAL_TABLET | Freq: Three times a day (TID) | ORAL | 0 refills | Status: DC | PRN

## 2016-06-12 MED ORDER — CYCLOBENZAPRINE HCL 10 MG PO TABS
10.0000 mg | ORAL_TABLET | Freq: Once | ORAL | Status: AC
  Administered 2016-06-12: 10 mg via ORAL
  Filled 2016-06-12: qty 1

## 2016-06-12 MED ORDER — CLONAZEPAM 0.5 MG PO TABS
1.0000 mg | ORAL_TABLET | Freq: Once | ORAL | Status: AC
  Administered 2016-06-12: 1 mg via ORAL
  Filled 2016-06-12: qty 2

## 2016-06-12 NOTE — Discharge Instructions (Signed)
You were evaluated for left-sided chest pain, and although no certain cause was found, as we discussed, your exam and evaluation are reassuring in the emergency department today.  We discussed avoiding CT scan at this point in time, but certainly return to the emergency department immediately for any worsening chest pain, trouble breathing, palpitations, dizziness or passing out, fever, or any other symptoms concerning to you.  We discussed, you may look up the technique called "Tapping" (try tappingsolution.com) or EFT (Emotional Freedom Technique) on youTube or internet which may help in stress relief.

## 2016-06-12 NOTE — ED Notes (Signed)
Pt called RN into room and states pain is increasing and she now feels lightheaded, Dr. Shaune PollackLord made aware

## 2016-06-12 NOTE — ED Triage Notes (Signed)
Pt presents with left side chest pain with some left side neck pain with some shortness of breath. Pt with hx of PE.

## 2016-06-12 NOTE — ED Provider Notes (Signed)
[facility] Emergency Department Provider Note ____________________________________________   I have reviewed the triage vital signs and the triage nursing note.  HISTORY  Chief Complaint Chest Pain   Historian Patient with mother at bedside  HPI Sharon CatenaKristen Harner is a 35 y.o. female with history of obesity, history of gastric bypass several years ago, here with left upper chest discomfort for a few days. She states it increases in intensity when she takes a deeper breath.  She had what sounds like potentially an unprovoked PE diagnosed in 2003 for which she took 3 months of coumadin.  She had been on ocp prior to diagnosis of PE. At that time she was fairly short of breath and had much worse pleuritic chest pain.  Because of PTSD she does follow with a psychiatrist and therapist.  She has been under significant stress related to her work environment.  Denies indigestion symptoms.  No recent cough or fever.      Past Medical History:  Diagnosis Date  . Chicken pox   . Depression   . Eating disorder    Has had residential treatment and hospitalization previously.  Marland Kitchen. History of anemia   . History of pulmonary embolus (PE)   . Hypothyroidism     Patient Active Problem List   Diagnosis Date Noted  . Iron deficiency anemia 04/08/2016  . B12 deficiency 04/08/2016  . Dehydration 04/06/2016  . S/P gastric bypass 03/20/2016  . Eating disorder 03/20/2016  . Insomnia 03/20/2016  . PTSD (post-traumatic stress disorder) 03/20/2016  . History of hypothyroidism 03/20/2016    Past Surgical History:  Procedure Laterality Date  . CHOLECYSTECTOMY  2004  . GASTRIC BYPASS      Prior to Admission medications   Medication Sig Start Date End Date Taking? Authorizing Provider  clonazePAM (KLONOPIN) 0.5 MG tablet Take 1 tablet (0.5 mg total) by mouth 2 (two) times daily as needed for anxiety. 04/27/16  Yes Tommie SamsJayce G Cook, DO  clonazePAM (KLONOPIN) 1 MG tablet Take  1 mg by mouth at bedtime.   Yes Historical Provider, MD  sertraline (ZOLOFT) 100 MG tablet Take 2 tablets (200 mg total) by mouth daily. Patient taking differently: Take 300 mg by mouth daily.  04/27/16  Yes Jayce G Cook, DO  ondansetron (ZOFRAN) 4 MG tablet Take 1 tablet (4 mg total) by mouth every 8 (eight) hours as needed for nausea or vomiting. Patient not taking: Reported on 04/08/2016 04/06/16   Tommie SamsJayce G Cook, DO  traZODone (DESYREL) 50 MG tablet Take 1-2 tablets (50-100 mg total) by mouth at bedtime as needed for sleep. Patient not taking: Reported on 06/12/2016 03/20/16   Tommie SamsJayce G Cook, DO  Vitamin D, Ergocalciferol, (DRISDOL) 50000 units CAPS capsule Take 1 capsule (50,000 Units total) by mouth every 7 (seven) days. Patient not taking: Reported on 06/12/2016 03/23/16   Tommie SamsJayce G Cook, DO    Allergies  Allergen Reactions  . Penicillins Hives  . Azithromycin   . Other     Narcotics- Burning in the stomach (whether PO or IV)    Family History  Problem Relation Age of Onset  . Hypertension Mother   . Hypertension Father   . Mental retardation Father   . Diabetes Father   . Prostate cancer Paternal Grandfather     Social History Social History  Substance Use Topics  . Smoking status: Never Smoker  . Smokeless tobacco: Never Used  . Alcohol use 0.0 - 0.6 oz/week    Review of Systems  Constitutional: Negative for fever. Eyes: Negative for visual changes. ENT: Negative for sore throat. Cardiovascular: Intermittent left upper chest discomfort as per hpi. Respiratory: Negative for wheezing Gastrointestinal: Negative for abdominal pain, vomiting and diarrhea. Genitourinary: Negative for dysuria. Musculoskeletal: Negative for back pain. Skin: Negative for rash. Neurological: Negative for headache. 10 point Review of Systems otherwise negative ____________________________________________   PHYSICAL EXAM:  VITAL SIGNS: ED Triage Vitals  Enc Vitals Group     BP 06/12/16 1046  (!) 127/59     Pulse Rate 06/12/16 1046 73     Resp 06/12/16 1046 20     Temp 06/12/16 1046 98.2 F (36.8 C)     Temp Source 06/12/16 1046 Oral     SpO2 06/12/16 1046 97 %     Weight --      Height 06/12/16 1047 5\' 3"  (1.6 m)     Head Circumference --      Peak Flow --      Pain Score 06/12/16 1047 3     Pain Loc --      Pain Edu? --      Excl. in GC? --      Constitutional: Alert and oriented. Well appearing and in no distress.  HEENT   Head: Normocephalic and atraumatic.      Eyes: Conjunctivae are normal. PERRL. Normal extraocular movements.      Ears:         Nose: No congestion/rhinnorhea.   Mouth/Throat: Mucous membranes are moist.   Neck: No stridor. Cardiovascular/Chest: Normal rate, regular rhythm.  No murmurs, rubs, or gallops. Respiratory: Normal respiratory effort without tachypnea nor retractions. Breath sounds are clear and equal bilaterally. No wheezes/rales/rhonchi. Gastrointestinal: Soft. No distention, no guarding, no rebound. Nontender.  Obese Genitourinary/rectal:Deferred Musculoskeletal: Nontender with normal range of motion in all extremities. No joint effusions.  No lower extremity tenderness.  No edema. Neurologic:  Normal speech and language. No gross or focal neurologic deficits are appreciated. Skin:  Skin is warm, dry and intact. No rash noted. Psychiatric: A little anxious and tearful when we talked about stressors.  Mood and affect are normal. Speech and behavior are normal. Patient exhibits appropriate insight and judgment.   ____________________________________________  LABS (pertinent positives/negatives)  Labs Reviewed  BASIC METABOLIC PANEL - Abnormal; Notable for the following:       Result Value   Calcium 8.8 (*)    All other components within normal limits  CBC - Abnormal; Notable for the following:    RDW 15.0 (*)    All other components within normal limits  TROPONIN I  FIBRIN DERIVATIVES D-DIMER ([facility] ONLY)     ____________________________________________    EKG I, Governor Rooks, MD, the attending physician have personally viewed and interpreted all ECGs.  65 bpm. Normal sinus rhythm with sinus  ____________________________________________  RADIOLOGY All Xrays were viewed by me. Imaging interpreted by Radiologist.  Chest two-view: No active cardiopulmonary disease. __________________________________________  PROCEDURES  Procedure(s) performed: None  Critical Care performed: None  ____________________________________________   ED COURSE / ASSESSMENT AND PLAN  Pertinent labs & imaging results that were available during my care of the patient were reviewed by me and considered in my medical decision making (see chart for details).   Ms. Berninger is here for mild intermittent left-sided chest discomfort. The overwhelming seen is the amount of stress and anxiety that she's been under due to her situation and boss. She does have a history of prostatic stress disorder and follows with a psychiatrist  and therapist and although this does not feel like a classic panic attack, she does feel like stress may be a very significant component.  Symptoms do not sound like ACS and EKG and labs are reassuring. She did have a PE in the past, but states that symptoms were much more significant. She is not tachycardic, febrile, or hypoxic. We discussed that I think d-dimer would be appropriate, although some risk of potentially missing PE. I think it appropriate in this situation where I feel like she has an alternate diagnosis of anxiety and possible musculoskeletal pain.  We discussed at length use of d-dimer versus CT scan of the chest, and she does feel like she would love to avoid CT scan if at all possible.  Her d-dimer is reassuring. We discussed her results in her symptoms. She would like to go on home tonight. I am going to prescribe Flexeril for some help with potential muscular  soreness.    CONSULTATIONS:   None   Patient / Family / Caregiver informed of clinical course, medical decision-making process, and agree with plan.   I discussed return precautions, follow-up instructions, and discharge instructions with patient and/or family.   ___________________________________________   FINAL CLINICAL IMPRESSION(S) / ED DIAGNOSES   Final diagnoses:  Nonspecific chest pain  Anxiety              Note: This dictation was prepared with Dragon dictation. Any transcriptional errors that result from this process are unintentional    Governor Rooks, MD 06/12/16 1302

## 2016-06-25 ENCOUNTER — Ambulatory Visit: Payer: BC Managed Care – PPO | Admitting: Family Medicine

## 2016-07-08 ENCOUNTER — Inpatient Hospital Stay: Payer: BC Managed Care – PPO | Attending: Hematology and Oncology

## 2016-07-08 DIAGNOSIS — Z9884 Bariatric surgery status: Secondary | ICD-10-CM

## 2016-07-08 DIAGNOSIS — D509 Iron deficiency anemia, unspecified: Secondary | ICD-10-CM

## 2016-07-08 LAB — CBC WITH DIFFERENTIAL/PLATELET
Basophils Absolute: 0.1 10*3/uL (ref 0–0.1)
Basophils Relative: 1 %
Eosinophils Absolute: 0.2 10*3/uL (ref 0–0.7)
Eosinophils Relative: 2 %
HCT: 38.8 % (ref 35.0–47.0)
Hemoglobin: 12.9 g/dL (ref 12.0–16.0)
Lymphocytes Relative: 23 %
Lymphs Abs: 1.8 10*3/uL (ref 1.0–3.6)
MCH: 27.9 pg (ref 26.0–34.0)
MCHC: 33.2 g/dL (ref 32.0–36.0)
MCV: 84.1 fL (ref 80.0–100.0)
Monocytes Absolute: 0.6 10*3/uL (ref 0.2–0.9)
Monocytes Relative: 8 %
Neutro Abs: 5.1 10*3/uL (ref 1.4–6.5)
Neutrophils Relative %: 66 %
Platelets: 252 10*3/uL (ref 150–440)
RBC: 4.61 MIL/uL (ref 3.80–5.20)
RDW: 14.6 % — ABNORMAL HIGH (ref 11.5–14.5)
WBC: 7.8 10*3/uL (ref 3.6–11.0)

## 2016-07-08 LAB — FERRITIN: Ferritin: 22 ng/mL (ref 11–307)

## 2016-07-12 ENCOUNTER — Other Ambulatory Visit: Payer: Self-pay | Admitting: Family Medicine

## 2016-07-13 ENCOUNTER — Ambulatory Visit (INDEPENDENT_AMBULATORY_CARE_PROVIDER_SITE_OTHER): Payer: BC Managed Care – PPO | Admitting: Family Medicine

## 2016-07-13 ENCOUNTER — Encounter: Payer: Self-pay | Admitting: Family Medicine

## 2016-07-13 VITALS — BP 126/80 | HR 78 | Temp 98.7°F

## 2016-07-13 DIAGNOSIS — R208 Other disturbances of skin sensation: Secondary | ICD-10-CM | POA: Diagnosis not present

## 2016-07-13 DIAGNOSIS — B349 Viral infection, unspecified: Secondary | ICD-10-CM | POA: Diagnosis not present

## 2016-07-13 NOTE — Assessment & Plan Note (Signed)
New problem. Unclear etiology/prognosis at this time. Advised to continue not taking Lamictal. No evidence of rash at this time. PRN Benadryl, Hydrocortisone. Call if fails to improve or worsens.

## 2016-07-13 NOTE — Patient Instructions (Signed)
Let me know how it goes after this week.  Call with worsening symptoms.  Follow up closely with Psychiatry.  Take care  Dr. Adriana Simasook

## 2016-07-13 NOTE — Assessment & Plan Note (Signed)
Patient with complaints of sore throat, fatigue, and feeling cold. Likely viral illness. Supportive care.

## 2016-07-13 NOTE — Progress Notes (Signed)
Subjective:  Patient ID: Sharon Gibson, female    DOB: Feb 23, 1981  Age: 35 y.o. MRN: [id number]  CC: Medication issue, ST  HPI:  35 year old female with past medical history of eating disorder, PTSD, iron deficiency presents with the above complaints.  Patient states that she started taking Lamictal approximately 3 weeks ago. This past Friday she developed a burning sensation of her back. She called the on-call physician who prescribed this medication and she was instructed to stop. She states that she continues to have this burning sensation of her skin. She's noticed some bumps on her anterior chest. No known relieving factors. No medications or interventions tried. No discrete rash. No mucosal involvement. No other associated symptoms.  Patient also notes that yesterday she did not feel well. She's been experiencing fatigue, sore throat, and feeling cold. No reported sick contacts. No known exacerbating or relieving factors. No fever. No other complaints this time.  Social Hx   Social History   Social History  . Marital status: Single    Spouse name: N/A  . Number of children: N/A  . Years of education: N/A   Social History Main Topics  . Smoking status: Never Smoker  . Smokeless tobacco: Never Used  . Alcohol use 0.0 - 0.6 oz/week  . Drug use: No  . Sexual activity: Not Asked   Other Topics Concern  . None   Social History Narrative  . None   Review of Systems  Constitutional: Positive for fatigue.  HENT: Positive for sore throat.   Skin:       Skin "burning".   Objective:  BP 126/80 (BP Location: Right Arm, Patient Position: Sitting, Cuff Size: Large)   Pulse 78   Temp 98.7 F (37.1 C) (Oral)   SpO2 99%   BP/Weight 07/13/2016 06/12/2016 05/13/2016  Systolic BP 126 106 97  Diastolic BP 80 56 57  Wt. (Lbs) - - -   Physical Exam  Constitutional: She is oriented to person, place, and time. She appears well-developed. No distress.  HENT:  Mouth/Throat:  Oropharynx is clear and moist.  Cardiovascular: Normal rate and regular rhythm.   2/6 systolic murmur.  Pulmonary/Chest: Effort normal. She has no wheezes. She has no rales.  Neurological: She is alert and oriented to person, place, and time.  Skin:  No rash. No erythema. Normal.  Psychiatric: She has a normal mood and affect.  Vitals reviewed.  Lab Results  Component Value Date   WBC 7.8 07/08/2016   HGB 12.9 07/08/2016   HCT 38.8 07/08/2016   PLT 252 07/08/2016   GLUCOSE 88 06/12/2016   CHOL 164 03/20/2016   TRIG 61.0 03/20/2016   HDL 56.90 03/20/2016   LDLCALC 95 03/20/2016   ALT 10 03/20/2016   AST 11 03/20/2016   NA 138 06/12/2016   K 4.6 06/12/2016   CL 107 06/12/2016   CREATININE 0.63 06/12/2016   BUN 16 06/12/2016   CO2 25 06/12/2016   TSH 9.85 (H) 03/20/2016   HGBA1C 5.3 03/20/2016    Assessment & Plan:   Problem List Items Addressed This Visit    Viral illness    Patient with complaints of sore throat, fatigue, and feeling cold. Likely viral illness. Supportive care.      Burning sensation of skin - Primary    New problem. Unclear etiology/prognosis at this time. Advised to continue not taking Lamictal. No evidence of rash at this time. PRN Benadryl, Hydrocortisone. Call if fails to improve or worsens.  Other Visit Diagnoses   None.     Meds ordered this encounter  Medications  . DISCONTD: lamoTRIgine (LAMICTAL) 25 MG tablet    Sig: Take 1 tablet at bedtime for 2 weeks, then take 2 tablets at bedtime.    Follow-up: Return if symptoms worsen or fail to improve.  Sharon Gibson Other DO [facility]

## 2016-07-13 NOTE — Telephone Encounter (Signed)
Refilled 03/20/16. Pt last seen 04/06/16. Please advise?

## 2016-07-15 ENCOUNTER — Encounter: Payer: Self-pay | Admitting: Family Medicine

## 2016-07-15 ENCOUNTER — Telehealth: Payer: Self-pay | Admitting: Family Medicine

## 2016-07-15 NOTE — Telephone Encounter (Signed)
Patient seen briefly in clinic today. See chart.

## 2016-07-15 NOTE — Telephone Encounter (Signed)
Pt mom called and stated that pt is really not doing well and stated that pt has been having some suicidal thoughts and thinks that it is being exacerbated by the fact that she is so worried about losing her job and insurance. Sent the call to Ashleigh to have her speak with pt's mom to get more information.

## 2016-07-15 NOTE — Telephone Encounter (Signed)
Spoke with mother she states that daughters is worried about her job and health insurance due to her not being able to work this week since coming of the medication she was reacting to that was given by her psychologist.  Mother is in [place] and is not able to come back to [place] until Saturday. She is worried about her daughter due to her daughter stating she feels worthless. Mother states that pt is alone in [place] and only has her dog. Mother stated that if pt was told to go to hospital she would not due to her dog needing particular dietary needs. I tried calling the pt but was unable to reach her so I left a voicemail.

## 2016-07-15 NOTE — Telephone Encounter (Signed)
Pt called back and I spoke with her she stated that she was having anxiety along depression. This being she is nervous when it comes to her job. I offered to right a letter to her job and she asked if she could have that.

## 2016-07-15 NOTE — Progress Notes (Signed)
Our office received a call from the patient's mother earlier today. She was connected to my CMA, Dutch QuintAshleigh Newton. Mother expressed concerns about the patient's mental status. She stated that she was concerned that her daughter was going to harm herself. Ashleigh subsequent spoke to the patient after receiving the phone call from her mother. Patient was emotional and states that she's been overwhelmed. She is particularly concerned about her health issues causing her to perform poorly at her job. She states that she's worried about losing her job. Patient was instructed to come in for work note in order to help with her employer. Patient was then brought into exam room so that I could speak with her about her ongoing issues. She states that she is overwhelmed and incredibly anxious. She has had thoughts of suicide. No current plan although she states that she has tried to overdose in the past. Patient has an extensive psychiatric history including multiple admissions. Patient and I conversed and we both agreed that her being seen at the hospital would be the best option. However, patient stated that she wanted to wait until her mother was back in town to be able to take care of her dog. I spoke with 2 other providers in the clinic about the best course of action. Patient states that she has no intent to harm herself and wants to wait to go to the hospital this Saturday when her mother returns to town. Verbal suicide contract done today. Patient agrees not to harm herself. We will touch base with patient tomorrow to see how she's doing. Planning for her to go to the hospital on Saturday. She has an appointment with her therapist tomorrow. I also encouraged her to talk to her psychiatrist.

## 2016-07-17 ENCOUNTER — Telehealth: Payer: Self-pay | Admitting: Family Medicine

## 2016-07-17 NOTE — Telephone Encounter (Signed)
Pt was called to check on her. Pt stated that she is doing better than she was 2-days ago. Pt stated once her mom gets into town she is going to [facility] in [place] to self admit herself to behavioral health. Pt stated that her suicidal thoughts are still there and unchanged. Pt was told that id she needs anything or just to talk to call my ext here at the office. She agreed.

## 2016-07-19 ENCOUNTER — Ambulatory Visit (HOSPITAL_COMMUNITY)
Admission: RE | Admit: 2016-07-19 | Discharge: 2016-07-19 | Disposition: A | Discharge status: Home / Self Care | Payer: BC Managed Care – PPO | Attending: Psychiatry | Admitting: Psychiatry

## 2016-07-19 ENCOUNTER — Encounter (HOSPITAL_COMMUNITY): Payer: Self-pay | Admitting: Behavioral Health

## 2016-07-19 NOTE — H&P (Signed)
Behavioral Health Medical Screening Exam  Sharon CatenaKristen Gibson is an 35 y.o. female who presented with her mother as a walk in due to increased anxiety and depression. She was recently taken off lamictal due to rash. Patient does not warrant an inpatient admission but feels that she needs a higher level of care such as IOP. Patient plans to take FMLA from work in order to focus on mental health. Denies any acute chest pain, shortness of breath, visual changes, or headaches. Reports some "sores on her lip and tongue that started Friday night". It in unclear if this could be a rare side effect of Lamictal so patient was advised to contact her PCP office tomorrow. Patient was provided with [facility] for [facility] and advised to call tomorrow morning for program availability.   Total Time spent with patient: 20 minutes  Psychiatric Specialty Exam: Physical Exam  Constitutional: She is oriented to person, place, and time. She appears well-developed and well-nourished.  HENT:  Head: Normocephalic and atraumatic.  Neck: Normal range of motion.  Cardiovascular: Normal rate, regular rhythm, normal heart sounds and intact distal pulses.   Respiratory: Effort normal and breath sounds normal.  GI: Soft. Bowel sounds are normal.  Musculoskeletal: Normal range of motion.  Neurological: She is alert and oriented to person, place, and time.  Skin: Skin is warm and dry. There is erythema (Mild areas visible on both arms, patient reports improving since Lamictal d/c almost one week ago ).    Review of Systems  Skin: Positive for rash (Improving rash on arms/back patient reported from Lamictal ).  Psychiatric/Behavioral: Positive for depression. The patient is nervous/anxious.     Blood pressure 124/63, pulse 61, temperature 98.6 F (37 C), resp. rate 16.There is no height or weight on file to calculate BMI.  General Appearance: Casual  Eye Contact:  Good  Speech:  Clear and Coherent  Volume:  Normal   Mood:  Depressed  Affect:  Flat  Thought Process:  Coherent and Goal Directed  Orientation:  Full (Time, Place, and Person)  Thought Content:  Logical  Suicidal Thoughts:  Yes.  without intent/plan  Homicidal Thoughts:  No  Memory:  Immediate;   Good Recent;   Good Remote;   Good  Judgement:  Fair  Insight:  Present  Psychomotor Activity:  Normal  Concentration: Concentration: Good and Attention Span: Good  Recall:  Good  Fund of Knowledge:Good  Language: Good  Akathisia:  No  Handed:  Right  AIMS (if indicated):     Assets:  Communication Skills Desire for Improvement Financial Resources/Insurance Housing Intimacy Leisure Time Resilience Social Support Talents/Skills  Sleep:       Musculoskeletal: Strength & Muscle Tone: within normal limits Gait & Station: normal Patient leans: N/A  Blood pressure 124/63, pulse 61, temperature 98.6 F (37 C), resp. rate 16.  Recommendations:  Based on my evaluation the patient does not appear to have an emergency medical condition.  Fransisca KaufmannAVIS, LAURA, NP 07/19/2016, 2:41 PM  Reviewed
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
## 2016-07-21 ENCOUNTER — Encounter: Payer: Self-pay | Admitting: Family Medicine

## 2016-07-21 ENCOUNTER — Ambulatory Visit (INDEPENDENT_AMBULATORY_CARE_PROVIDER_SITE_OTHER): Payer: BC Managed Care – PPO | Admitting: Family Medicine

## 2016-07-21 DIAGNOSIS — S0990XA Unspecified injury of head, initial encounter: Secondary | ICD-10-CM | POA: Diagnosis not present

## 2016-07-21 DIAGNOSIS — F4310 Post-traumatic stress disorder, unspecified: Secondary | ICD-10-CM | POA: Diagnosis not present

## 2016-07-21 NOTE — Patient Instructions (Signed)
Let me know if you need anything.  Stay hopeful and open minded.  Take care  Dr. Adriana Simasook

## 2016-07-21 NOTE — Assessment & Plan Note (Signed)
Worsening symptoms with thoughts of suicide. Has seen psych and has upcoming outpatient treatment. Continuing zoloft. FMLA paperwork filled out today.

## 2016-07-21 NOTE — Progress Notes (Signed)
Subjective:  Patient ID: Sharon Gibson, female    DOB: 1981/07/25  Age: 35 y.o. MRN: [id number]  CC: Head injury, follow up Depression/Anxiety/PTSD  HPI:  35 year old female presents with the above complaints.  Head injury  Patient hit her head on the car door as she was getting in.  This was over the weekend.  She reports associated headache. She states she's had some vision changes.  No reports of syncope.  No other associated symptoms.  She is feeling okay at this time.  Depression/anxiety/PTSD  Patient has an extensive mental health history.  She has recently been experiencing worsening symptoms and has had some thoughts of suicide.  She was recently seen briefly in the office after she came in to get a work note. We agreed at that time that she needed to be evaluated by a mental health professional.  She waited until her mother was back in town as we discussed. She went over the weekend and was seen by a psychiatrist in [place]. It was felt that she did not need inpatient admission at this time. She is going to be proceeding with intensive outpatient treatment.  She continues to struggle with her mood.  She is not optimistic about outpatient treatment as she's done this before in the past.  She is in need of FMLA paperwork to accommodate this.  Social Hx   Social History   Social History  . Marital status: Single    Spouse name: N/A  . Number of children: N/A  . Years of education: N/A   Social History Main Topics  . Smoking status: Never Smoker  . Smokeless tobacco: Never Used  . Alcohol use 0.0 - 0.6 oz/week  . Drug use: No  . Sexual activity: No   Other Topics Concern  . None   Social History Narrative  . None   Review of Systems  Constitutional: Negative.   Eyes: Positive for visual disturbance.  Neurological: Positive for headaches.  Psychiatric/Behavioral: Positive for sleep disturbance and suicidal ideas. The patient is  nervous/anxious.    Objective:  BP 122/72 (BP Location: Left Arm, Patient Position: Sitting, Cuff Size: Large)   Pulse 76   Temp 98.2 F (36.8 C) (Oral)   SpO2 95%   BP/Weight 07/21/2016 07/13/2016 06/12/2016  Systolic BP 122 126 106  Diastolic BP 72 80 56  Wt. (Lbs) - - -  Some encounter information is confidential and restricted. Go to Review Flowsheets activity to see all data.   Physical Exam  Constitutional: She is oriented to person, place, and time. She appears well-developed. No distress.  Cardiovascular: Normal rate and regular rhythm.   Pulmonary/Chest: Effort normal. She has no wheezes. She has no rales.  Neurological: She is alert and oriented to person, place, and time.  Psychiatric:  Anxious, depressed.  Vitals reviewed.  Lab Results  Component Value Date   WBC 7.8 07/08/2016   HGB 12.9 07/08/2016   HCT 38.8 07/08/2016   PLT 252 07/08/2016   GLUCOSE 88 06/12/2016   CHOL 164 03/20/2016   TRIG 61.0 03/20/2016   HDL 56.90 03/20/2016   LDLCALC 95 03/20/2016   ALT 10 03/20/2016   AST 11 03/20/2016   NA 138 06/12/2016   K 4.6 06/12/2016   CL 107 06/12/2016   CREATININE 0.63 06/12/2016   BUN 16 06/12/2016   CO2 25 06/12/2016   TSH 9.85 (H) 03/20/2016   HGBA1C 5.3 03/20/2016    Assessment & Plan:   Problem List  Items Addressed This Visit    PTSD (post-traumatic stress disorder)    Worsening symptoms with thoughts of suicide. Has seen psych and has upcoming outpatient treatment. Continuing zoloft. FMLA paperwork filled out today.      Head injury    New problem. Well appearing. No indication for further evaluation/work up. PRN NSAID's.       Other Visit Diagnoses   None.    Follow-up: PRN  Everlene OtherJayce Dotti Busey DO [facility] [place]

## 2016-07-21 NOTE — Progress Notes (Signed)
Pre visit review using our clinic review tool, if applicable. No additional management support is needed unless otherwise documented below in the visit note. 

## 2016-07-21 NOTE — Assessment & Plan Note (Signed)
New problem. Well appearing. No indication for further evaluation/work up. PRN NSAID's.

## 2016-07-23 ENCOUNTER — Other Ambulatory Visit (HOSPITAL_COMMUNITY): Payer: BC Managed Care – PPO | Admitting: Psychiatry

## 2016-07-23 ENCOUNTER — Encounter (HOSPITAL_COMMUNITY): Payer: Self-pay | Admitting: Psychiatry

## 2016-07-23 DIAGNOSIS — Z79899 Other long term (current) drug therapy: Secondary | ICD-10-CM | POA: Insufficient documentation

## 2016-07-23 DIAGNOSIS — Z880 Allergy status to penicillin: Secondary | ICD-10-CM | POA: Diagnosis not present

## 2016-07-23 DIAGNOSIS — F419 Anxiety disorder, unspecified: Secondary | ICD-10-CM | POA: Insufficient documentation

## 2016-07-23 DIAGNOSIS — Z9884 Bariatric surgery status: Secondary | ICD-10-CM | POA: Diagnosis not present

## 2016-07-23 DIAGNOSIS — Z833 Family history of diabetes mellitus: Secondary | ICD-10-CM | POA: Insufficient documentation

## 2016-07-23 DIAGNOSIS — Z86711 Personal history of pulmonary embolism: Secondary | ICD-10-CM | POA: Diagnosis not present

## 2016-07-23 DIAGNOSIS — F332 Major depressive disorder, recurrent severe without psychotic features: Secondary | ICD-10-CM | POA: Insufficient documentation

## 2016-07-23 DIAGNOSIS — Z881 Allergy status to other antibiotic agents status: Secondary | ICD-10-CM | POA: Insufficient documentation

## 2016-07-23 DIAGNOSIS — Z810 Family history of intellectual disabilities: Secondary | ICD-10-CM | POA: Insufficient documentation

## 2016-07-23 DIAGNOSIS — E039 Hypothyroidism, unspecified: Secondary | ICD-10-CM | POA: Diagnosis not present

## 2016-07-23 DIAGNOSIS — F4310 Post-traumatic stress disorder, unspecified: Secondary | ICD-10-CM | POA: Diagnosis not present

## 2016-07-23 DIAGNOSIS — Z818 Family history of other mental and behavioral disorders: Secondary | ICD-10-CM | POA: Diagnosis not present

## 2016-07-23 DIAGNOSIS — Z8249 Family history of ischemic heart disease and other diseases of the circulatory system: Secondary | ICD-10-CM | POA: Diagnosis not present

## 2016-07-23 DIAGNOSIS — F502 Bulimia nervosa: Secondary | ICD-10-CM | POA: Diagnosis not present
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
## 2016-07-24 ENCOUNTER — Other Ambulatory Visit (HOSPITAL_COMMUNITY): Payer: BC Managed Care – PPO | Attending: Psychiatry | Admitting: Psychiatry

## 2016-07-24 DIAGNOSIS — F332 Major depressive disorder, recurrent severe without psychotic features: Secondary | ICD-10-CM | POA: Diagnosis not present

## 2016-07-27 ENCOUNTER — Other Ambulatory Visit (HOSPITAL_COMMUNITY): Payer: BC Managed Care – PPO | Admitting: Psychiatry

## 2016-07-27 DIAGNOSIS — F332 Major depressive disorder, recurrent severe without psychotic features: Secondary | ICD-10-CM

## 2016-07-27 NOTE — Progress Notes (Signed)
    Daily Group Progress Note  Program: IOP  Group Time: 9:00-12:00   Participation Level:  active   Behavioral Response: engaged  Type of Therapy:  group therapy   Summary of Progress:  Patient related to another group member who fears his anxiety will never go away. She gave up on her dream of singing in the past due, in part, to pressure to lose weight/be thinner. She's also struggled with eating disorders in the past. 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 Though she loved this position, it didn't pay very well, and she has been [place] to find a [place]. At present she dislikes her work environment, and feels unfairly asked to do tasks that are outside her job description. However, she expressed a sense of fear and hopelessness around finding another job, because she feels that something else might be even worse.   Shaune PollackBrown, Jennifer B, LPC

## 2016-07-28 ENCOUNTER — Other Ambulatory Visit (HOSPITAL_COMMUNITY): Payer: BC Managed Care – PPO | Admitting: Psychiatry

## 2016-07-28 DIAGNOSIS — F332 Major depressive disorder, recurrent severe without psychotic features: Secondary | ICD-10-CM | POA: Diagnosis not present

## 2016-07-28 NOTE — Progress Notes (Signed)
    Daily Group Progress Note  Program: IOP  Group Time: 9:00-12:00  Participation Level: Active  Behavioral Response: Appropriate  Type of Therapy:  Group Therapy  Summary of Progress: Pt.'s first day in group. Pt. Met with case manager and psychiatrist. Pt. Introduced herself to the group and discussed briefly her history with major depression, eating disorder and PTSD. Pt. Participated in discussion about use of the grounding series to assist with symptoms associated with anxiety and depression.       Nancie Neas, LPC

## 2016-07-29 ENCOUNTER — Other Ambulatory Visit (HOSPITAL_COMMUNITY): Payer: BC Managed Care – PPO | Attending: Psychiatry | Admitting: Psychiatry

## 2016-07-29 DIAGNOSIS — Z5189 Encounter for other specified aftercare: Secondary | ICD-10-CM | POA: Insufficient documentation

## 2016-07-29 DIAGNOSIS — F339 Major depressive disorder, recurrent, unspecified: Secondary | ICD-10-CM | POA: Insufficient documentation

## 2016-07-29 DIAGNOSIS — F502 Bulimia nervosa: Secondary | ICD-10-CM | POA: Diagnosis not present

## 2016-07-29 DIAGNOSIS — F332 Major depressive disorder, recurrent severe without psychotic features: Secondary | ICD-10-CM

## 2016-07-29 MED ORDER — SERTRALINE HCL 100 MG PO TABS: 300.0000 mg | ORAL_TABLET | Freq: Every day | ORAL | 2 refills | Status: DC

## 2016-07-29 MED ORDER — CLONAZEPAM 1 MG PO TABS: 1.0000 mg | ORAL_TABLET | Freq: Every day | ORAL | 0 refills | Status: DC

## 2016-07-29 NOTE — Progress Notes (Signed)
    Daily Group Progress Note  Program: IOP  Group Time: 9:00-12:00   Participation Level:active   Behavioral Response:  responsive   Type of Therapy:   group therapy   Summary of Progress: Patient has decided she is fed up with her current work position, and is actively seeking a new job. She is scared because job searching is stressful, but she stated that she needs a definitive change. She feels she has a lot of support from her mom, which is really helpful to her. However, patient expresses cognitive dissonance around social connection. On the one hand she talks repeatedly about how important social connection, and understanding social connection is in her life, but she also reports consistently isolating and being lonely.   Boneta LucksJennifer Kymber Kosar, Ph.D., [facility]

## 2016-07-30 ENCOUNTER — Other Ambulatory Visit (HOSPITAL_COMMUNITY): Payer: BC Managed Care – PPO | Admitting: Psychiatry

## 2016-07-30 DIAGNOSIS — F332 Major depressive disorder, recurrent severe without psychotic features: Secondary | ICD-10-CM

## 2016-07-30 DIAGNOSIS — Z5189 Encounter for other specified aftercare: Secondary | ICD-10-CM | POA: Diagnosis not present

## 2016-07-31 ENCOUNTER — Encounter: Payer: Self-pay | Admitting: Family Medicine

## 2016-07-31 ENCOUNTER — Telehealth: Payer: Self-pay | Admitting: *Deleted

## 2016-07-31 ENCOUNTER — Other Ambulatory Visit (HOSPITAL_COMMUNITY): Payer: BC Managed Care – PPO | Admitting: Psychiatry

## 2016-07-31 DIAGNOSIS — Z5189 Encounter for other specified aftercare: Secondary | ICD-10-CM | POA: Diagnosis not present

## 2016-07-31 DIAGNOSIS — F332 Major depressive disorder, recurrent severe without psychotic features: Secondary | ICD-10-CM

## 2016-07-31 NOTE — Progress Notes (Signed)
    Daily Group Progress Note  Program: IOP  Group Time: 9:00-12:00   Participation Level:  active   Behavioral Response:engaged   Type of Therapy:  group therapy   Summary of Progress: Patient was alert and active during group.  Counselor and group members discussed positive psychology after watching a "The Positive Advantage" TedTalk.  Counselor and group members discussed what image is portrayed on the outside versus what is felt on the inside.  Patient shared what she shows on the outside and what she hides from others.  Patient stated much of what she hides is due to lack of trust.  Counselor and group discussed "levels" of sharing and trust.  Patient discussed how grounding techniques have helped her with anxiety.  Shaune PollackBrown, Jennelle Pinkstaff B, LPC

## 2016-07-31 NOTE — Telephone Encounter (Signed)
Pt has dropped of FMLA papers. Papers are located up front in Dr. Patsey Bertholdook's color folder.

## 2016-07-31 NOTE — Telephone Encounter (Signed)
Per pt she is applying for short term diablity with her work and she will bring the paperwork by for you to fill out.

## 2016-07-31 NOTE — Telephone Encounter (Signed)
Placed in Dr.Cooks red FMLA folder.

## 2016-07-31 NOTE — Telephone Encounter (Signed)
Patient requested a call in reference to her FMLA disability, and her returning to work next week.  Pt contact [phone]

## 2016-08-03 ENCOUNTER — Other Ambulatory Visit (HOSPITAL_COMMUNITY): Payer: BC Managed Care – PPO

## 2016-08-03 DIAGNOSIS — Z7689 Persons encountering health services in other specified circumstances: Secondary | ICD-10-CM

## 2016-08-03 NOTE — Telephone Encounter (Signed)
Notify patient that form is ready, thanks

## 2016-08-03 NOTE — Progress Notes (Signed)
    Daily Group Progress Note  Program: IOP Group Time: 9:00-12:00   Participation Level: active    Behavioral Response: engaged   Type of Therapy:  group therapy   Summary of Progress:  Patient fears that by doing DBT individual therapy she is simply finding another way to cognitively wall off her pain-and that this will not provide her with a long-term solution. Counselor encouraged the patient to gauge her experience by mindfully observing her reactions and feelings. Patient agreed to do this, and expressed that she feels she needs to find a midway point between having coping skills to deal with her trauma, and processing it experientially.  Shaune PollackBrown, Jennifer B, LPC

## 2016-08-03 NOTE — Telephone Encounter (Signed)
Faxed and ready for pick up, pt aware.

## 2016-08-03 NOTE — Telephone Encounter (Signed)
Form Complete. On Ashleigh's desk.

## 2016-08-04 ENCOUNTER — Other Ambulatory Visit (HOSPITAL_COMMUNITY): Payer: BC Managed Care – PPO | Admitting: Psychiatry

## 2016-08-04 DIAGNOSIS — F332 Major depressive disorder, recurrent severe without psychotic features: Secondary | ICD-10-CM

## 2016-08-04 DIAGNOSIS — Z5189 Encounter for other specified aftercare: Secondary | ICD-10-CM | POA: Diagnosis not present
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
## 2016-08-04 NOTE — Progress Notes (Signed)
    Daily Group Progress Note  Program: IOP   Group Time: 9:00-12:00   Participation Level:  active   Behavioral Response:  responsive  Summary of Progress:  Patient reports feeling anxious today. Shared that she has a diagnosis of anxiety and PTSD. She was reflecting today on past events that were not pleasant for her. She felt like she was on the verge of a panic attack.  Patient removed herself from group to use her coping strategies (breathing exercises, positive self talk, relaxing, etc.). Patient was observed to be relaxed once she returned. Patient mentioned that she feels worthy and hopeful about life despite negative past events. She appeared anxious but motivated as she shared that she has a new therapist and looks forward to future visits with this therapist.   Shaune PollackBrown, Jennifer B, [facility]

## 2016-08-05 ENCOUNTER — Other Ambulatory Visit (HOSPITAL_COMMUNITY): Payer: BC Managed Care – PPO | Admitting: Psychiatry

## 2016-08-05 DIAGNOSIS — F332 Major depressive disorder, recurrent severe without psychotic features: Secondary | ICD-10-CM

## 2016-08-05 DIAGNOSIS — Z5189 Encounter for other specified aftercare: Secondary | ICD-10-CM | POA: Diagnosis not present

## 2016-08-06 ENCOUNTER — Other Ambulatory Visit (HOSPITAL_COMMUNITY): Payer: BC Managed Care – PPO | Admitting: Psychiatry

## 2016-08-06 DIAGNOSIS — F332 Major depressive disorder, recurrent severe without psychotic features: Secondary | ICD-10-CM

## 2016-08-06 DIAGNOSIS — Z5189 Encounter for other specified aftercare: Secondary | ICD-10-CM | POA: Diagnosis not present

## 2016-08-07 ENCOUNTER — Other Ambulatory Visit (HOSPITAL_COMMUNITY): Payer: BC Managed Care – PPO | Admitting: Psychiatry

## 2016-08-07 DIAGNOSIS — F332 Major depressive disorder, recurrent severe without psychotic features: Secondary | ICD-10-CM

## 2016-08-07 DIAGNOSIS — Z5189 Encounter for other specified aftercare: Secondary | ICD-10-CM | POA: Diagnosis not present

## 2016-08-10 ENCOUNTER — Other Ambulatory Visit (HOSPITAL_COMMUNITY): Payer: BC Managed Care – PPO

## 2016-08-10 NOTE — Progress Notes (Signed)
    Daily Group Progress Note  Program: IOP  Group Time: 9:00-12:00   Participation Level:  active   Behavioral Response: engaged  Type of Therapy:   group therapy   Summary of Progress:  Pt shared about anger and abandonment she's felt, and elicited support from the group. Pt was also very responsive to other members of group, and offered her own interpretations of what mindfulness is and how it can be used in every day life.  Shaune PollackBrown, Nocole Zammit B, LPC

## 2016-08-10 NOTE — Progress Notes (Signed)
    Daily Group Progress Note  Program: IOP  Daily Group Progress Note   Program: IOP   Group Time: 9:00-12:00 (9-10 [facility] provided educational information)   Participation Level:  active   Behavioral Response:  responsive   Type of Therapy:   group therapy  Summary of Progress:  Patient reports feeling anxious today. Shared that she has a diagnosis of anxiety and PTSD. She was reflecting today on past events that were not pleasant for her. She shared that she and her parents have issues with anger/trauma.  She plans to utilize a project learned in a prior therapy sessions with her family over the course of the holidays.  The name of the project is "Smash the plate". She explained that this project will allow both her and her family to work on anger and coping mechanisms. Patient mentioned that her family is very supportive. She resonates to her family for self healing. She appeared anxious but motivated as she shared that she feels worthy and hopeful about life despite negative past events. She looks forward to working with her family as a team for self healing.   Shaune PollackBrown, Manya Balash B, LPC

## 2016-08-10 NOTE — Progress Notes (Signed)
    Daily Group Progress Note  Program: IOP  Group Time: 9:00-12:00   Participation Level:  active   Behavioral Response: engaged  Type of Therapy:   group therapy   Summary of Progress:  Pt discussed the investigative nature of mindfulness with the group, and shared that she's been using a mindfulness journal, which has been a help to her. She also relayed a technique she uses when experiencing panic-she asks herself if there is a tiger in the room-and since there's not then she finds she is able to calm herself down.   Shaune PollackBrown, Ramsey Midgett B, LPC

## 2016-08-11 ENCOUNTER — Other Ambulatory Visit (HOSPITAL_COMMUNITY): Payer: BC Managed Care – PPO | Admitting: Psychiatry

## 2016-08-11 DIAGNOSIS — F332 Major depressive disorder, recurrent severe without psychotic features: Secondary | ICD-10-CM

## 2016-08-11 DIAGNOSIS — Z5189 Encounter for other specified aftercare: Secondary | ICD-10-CM | POA: Diagnosis not present
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
## 2016-08-12 ENCOUNTER — Other Ambulatory Visit (HOSPITAL_COMMUNITY): Payer: BC Managed Care – PPO | Admitting: Psychiatry

## 2016-08-12 DIAGNOSIS — Z5189 Encounter for other specified aftercare: Secondary | ICD-10-CM | POA: Diagnosis not present

## 2016-08-12 DIAGNOSIS — F332 Major depressive disorder, recurrent severe without psychotic features: Secondary | ICD-10-CM

## 2016-08-13 ENCOUNTER — Other Ambulatory Visit (HOSPITAL_COMMUNITY): Payer: BC Managed Care – PPO | Admitting: Psychiatry

## 2016-08-13 DIAGNOSIS — Z5189 Encounter for other specified aftercare: Secondary | ICD-10-CM | POA: Diagnosis not present

## 2016-08-13 DIAGNOSIS — F332 Major depressive disorder, recurrent severe without psychotic features: Secondary | ICD-10-CM

## 2016-08-13 NOTE — Progress Notes (Signed)
    Daily Group Progress Note  Program: IOP  Group Time: 9:00-12:00  Participation Level: Active  Behavioral Response: Appropriate  Type of Therapy:  Group Therapy  Summary of Progress: Pt. Presented with bright affect, talkative, engaged in the group process. Pt. Shared her history of eating disorders and treatment and need for therapeutic support group locally. Pt. Discussed her interests in life coaching, martial arts, and dance and finding local outlets for her interests.     Shaune PollackBrown, Silveria Botz B, LPC

## 2016-08-13 NOTE — Progress Notes (Signed)
    Daily Group Progress Note  Program: IOP  Group Time: 9:00-12:00   Participation Level: active   Behavioral Response: engaged   Type of Therapy:   group therapy   Summary of Progress: Patient was alert and active during group.    The group discussed mindfulness and simple ways it can be practiced, including using mazes, coloring, or completing word searches.  Patient stated coloring is stressful, so she will try word searches and mazes.  Group discussed the importance of using senses, observing, describing, focusing on one thing at a time, being fully involved, and being non-judgmental.    Shaune PollackBrown, Jayshaun Phillips B, LPC

## 2016-08-13 NOTE — Progress Notes (Signed)
    Daily Group Progress Note  Program: IOP  Group Time: 9-12:00  Participation Level: Active  Behavioral Response: Appropriate  Type of Therapy:  Group Therapy  Summary of Progress: Pt. Presented as talkative, engaged in the group process. Pt. Shared her history of eating disorder and fears that her symptoms were returning. Pt. Discussed plans to manage during the holidays by spending quality time with her family who are very supportive.        Shaune PollackBrown, Jennifer B, LPC

## 2016-08-14 ENCOUNTER — Other Ambulatory Visit (HOSPITAL_COMMUNITY): Payer: BC Managed Care – PPO | Admitting: Psychiatry

## 2016-08-17 ENCOUNTER — Other Ambulatory Visit (HOSPITAL_COMMUNITY): Payer: BC Managed Care – PPO | Admitting: Psychiatry

## 2016-08-17 DIAGNOSIS — Z5189 Encounter for other specified aftercare: Secondary | ICD-10-CM | POA: Diagnosis not present

## 2016-08-17 DIAGNOSIS — F332 Major depressive disorder, recurrent severe without psychotic features: Secondary | ICD-10-CM

## 2016-08-17 MED ORDER — CLONAZEPAM 1 MG PO TABS: 1.0000 mg | ORAL_TABLET | Freq: Every day | ORAL | 2 refills | Status: DC

## 2016-08-17 MED ORDER — GABAPENTIN 300 MG PO CAPS: 300.0000 mg | ORAL_CAPSULE | Freq: Three times a day (TID) | ORAL | 2 refills | Status: DC

## 2016-08-17 MED ORDER — SERTRALINE HCL 100 MG PO TABS: 300.0000 mg | ORAL_TABLET | Freq: Every day | ORAL | 2 refills | Status: DC

## 2016-08-17 NOTE — Patient Instructions (Signed)
D:  Patient completed MH-IOP today.  A:  Follow up with [name], LPC on a weekly basis and [name] on 10-12-16 @ 2 pm.  Encouraged support groups.  Recommended [facility] with [name], LCAS [phone]) [phone].  R:  Pt receptive.

## 2016-08-17 NOTE — Progress Notes (Signed)
BH IOP DISCHARGE NOTE  Patient:  Sharon CatenaKristen Gibson DOB:  10-07-1980  Date of Admission:07/23/2016   Date of Discharge: 08/17/2016  Reason for Admission:depression  IOP Course:Sharon Gibson attended and participated.  She was less depressed and anxious at discharge and did not have any eating disorder issues.  She is a little uneasy about being discharged as the group has been supportive and reassuring to her but agrees she needs to be out on her own to see how she does.  Mental Status at Discharge:no suicidal thoughts  Diagnosis: severe major depression, recurrent without psychotic features.  Bulimia nervosa in partial remission mild  Level of Care:  IOP  Discharge destination: follow up with Dr Vanetta ShawlHisada and Meredith LeedsBeth Gibson   Comments:  Medication refills phoned in to last till appointment with psychiatrist  The patient received suicide prevention pamphlet:  Yes   Carolanne GrumblingGerald Anarie Kalish, MD

## 2016-08-17 NOTE — Progress Notes (Signed)
Sharon CatenaKristen Gibson is a 35 y.o. , single, employed, Caucasian female who was referred per PCP (Dr. Bradley FerrisJaycee Cook); treatment for worsening depressive and anxiety symptoms.  [facility]denied a plan or intent).   Mentioned deterrent were her parents.  Discussed safety options at length.  Pt able to contract for safety.  Admitted to one prior suicide attempt (OD) in college.  Pt c/o daily panic attacks accompanied by body pain.  Pt has struggled with anxiety, depression and eating disorder all her life since age 89 when she first received treatment.  She has had numerous residential along with inpatient admissions due to eating and emotional disorders.  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  Pt is a Psychologist, sport and exercisecommunications specialist.  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  PCP recommends pt to work only twenty hrs a week starting 08-04-16 until the end of the year.  2)  Relocation:  [place] from [place] two [place]rs ago to be near best friend.  Pt purchased a home in May 2017.  Best friend has gotten married and has a kids.  Pt may see her once a month now.  Pt currently sees a therapist [name], [facility] and [name] on an outpatient basis.  Pt states she would like someone local.  Family Hx:  Father (Anxiety); Paternal Aunt ( attempted suicide); Mother (depression). Pt completed MH-IOP today.  Reports that MH-IOP was very helpful.  Pt is applying skills that she learned.  Denies any current SI.  Pt recently started volunteering with [employer] and is enjoying it.  A:  D/C today.  F/U with [name], LPC on a weekly bases.  [employer] with [name] on 10-12-16 @ 2 pm.  Encouraged support groups.  Recommended [facility] with [name], LCAS.  R:  Pt receptive.                                                  Marland Kitchen.        Chestine SporeLARK, RITA, M.Ed, CNA

## 2016-08-18 ENCOUNTER — Other Ambulatory Visit (HOSPITAL_COMMUNITY): Payer: BC Managed Care – PPO

## 2016-08-19 ENCOUNTER — Other Ambulatory Visit (HOSPITAL_COMMUNITY): Payer: BC Managed Care – PPO

## 2016-08-19 NOTE — Progress Notes (Signed)
    Daily Group Progress Note  Program: IOP  Group Time: 9:00-12:00   Participation Level:active   Behavioral Response:  engaged   Type of Therapy:  group therapy   Summary of Progress:  This was the pt's last day in group. She reports signing up for a Tae kwon do class, which she feels very excited to be involved in. However, she had some difficulty with her new female instructor who lacks boundaries and was rushing a friendship with the pt. Pt checked in with group around feeling uncomfortable starting a friendship so quickly-and the group validated her reticence. A discussion around the difference between a healthy no and a no, which is intended to isolate, ensued.   Shaune PollackBrown, Symphony Demuro B, LPC

## 2016-08-21 ENCOUNTER — Other Ambulatory Visit (HOSPITAL_COMMUNITY): Payer: BC Managed Care – PPO

## 2016-08-24 ENCOUNTER — Other Ambulatory Visit (HOSPITAL_COMMUNITY): Payer: BC Managed Care – PPO

## 2016-08-25 ENCOUNTER — Other Ambulatory Visit (HOSPITAL_COMMUNITY): Payer: BC Managed Care – PPO

## 2016-08-26 ENCOUNTER — Other Ambulatory Visit (HOSPITAL_COMMUNITY): Payer: BC Managed Care – PPO

## 2016-08-27 ENCOUNTER — Other Ambulatory Visit (HOSPITAL_COMMUNITY): Payer: BC Managed Care – PPO

## 2016-08-28 ENCOUNTER — Other Ambulatory Visit (HOSPITAL_COMMUNITY): Payer: BC Managed Care – PPO

## 2016-08-31 ENCOUNTER — Other Ambulatory Visit (HOSPITAL_COMMUNITY): Payer: BC Managed Care – PPO

## 2016-09-01 ENCOUNTER — Other Ambulatory Visit (HOSPITAL_COMMUNITY): Payer: BC Managed Care – PPO

## 2016-09-02 ENCOUNTER — Other Ambulatory Visit (HOSPITAL_COMMUNITY): Payer: BC Managed Care – PPO

## 2016-09-02 ENCOUNTER — Ambulatory Visit (INDEPENDENT_AMBULATORY_CARE_PROVIDER_SITE_OTHER): Payer: BC Managed Care – PPO | Admitting: Family Medicine

## 2016-09-02 DIAGNOSIS — S76311A Strain of muscle, fascia and tendon of the posterior muscle group at thigh level, right thigh, initial encounter: Secondary | ICD-10-CM | POA: Diagnosis not present

## 2016-09-02 DIAGNOSIS — S76319A Strain of muscle, fascia and tendon of the posterior muscle group at thigh level, unspecified thigh, initial encounter: Secondary | ICD-10-CM | POA: Insufficient documentation

## 2016-09-02 DIAGNOSIS — M25561 Pain in right knee: Secondary | ICD-10-CM | POA: Diagnosis not present

## 2016-09-02 MED ORDER — PREDNISONE 10 MG (21) PO TBPK: ORAL_TABLET | ORAL | 0 refills | Status: DC

## 2016-09-02 NOTE — Assessment & Plan Note (Addendum)
New problem.  Suspect sprain and associated hamstring strain. Not improving with conservative care. Cannot take NSAIDs due to after surgery. Prednisone taper.

## 2016-09-02 NOTE — Patient Instructions (Signed)
Take the prednisone.  If you fail to improve, we will arrange US.  Take care  Dr. Adriana Simasook

## 2016-09-02 NOTE — Assessment & Plan Note (Signed)
New problem. No improvement with conservative care. Prednisone taper.

## 2016-09-02 NOTE — Progress Notes (Signed)
Subjective:  Patient ID: Enrigue CatenaKristen Ganus, female    DOB: 01-May-1981  Age: 35 y.o. MRN: [id number]  CC: Leg pain/Knee pain  HPI:  35 year old female presents with the above complaint.  Patient has recently taken up tae kwon do. She has been doing squats and rolls and kicks. She recently developed right knee pain as well as pain in the hamstring. Moderate in severity. Knee pain is predominantly in the popliteal fossa. This is been going on for the past 2 weeks. She rested over the past week and did not attend class. She's used ice and heat and has taken a dose of ibuprofen and has not had much improvement. No reported swelling. No reported redness. She does not recall a specific injury. No reports of popping, clicking, locking. She does report some pain with ambulation. No other reported symptoms. No other complaints at this time.  Social Hx   Social History   Social History  . Marital status: Single    Spouse name: N/A  . Number of children: N/A  . Years of education: N/A   Social History Main Topics  . Smoking status: Never Smoker  . Smokeless tobacco: Never Used  . Alcohol use No  . Drug use: No  . Sexual activity: No   Other Topics Concern  . Not on file   Social History Narrative  . No narrative on file   Review of Systems  Constitutional: Negative.   Musculoskeletal:       Right knee, leg pain.    Objective:  BP 116/64 (BP Location: Left Arm, Patient Position: Sitting, Cuff Size: Large)   Pulse 90   Temp 98.4 F (36.9 C) (Oral)   Resp 14   SpO2 96%   BP/Weight 09/02/2016 07/21/2016 07/13/2016  Systolic BP 116 122 126  Diastolic BP 64 72 80  Wt. (Lbs) - - -  Some encounter information is confidential and restricted. Go to Review Flowsheets activity to see all data.    Physical Exam  Constitutional: She is oriented to person, place, and time. She appears well-developed. No distress.  HENT:  Head: Normocephalic and atraumatic.  Pulmonary/Chest: Effort  normal.  Musculoskeletal:  Knee: Right Normal to inspection with no erythema or effusion or obvious bony abnormalities.  Palpation mild joint line tenderness medially. ROM limited in flexion secondary to pain. Ligaments with solid consistent endpoints including ACL, PCL, LCL, MCL. Patient with pain in the hamstring with muscle strength testing.   Neurological: She is alert and oriented to person, place, and time.  Vitals reviewed.   Lab Results  Component Value Date   WBC 7.8 07/08/2016   HGB 12.9 07/08/2016   HCT 38.8 07/08/2016   PLT 252 07/08/2016   GLUCOSE 88 06/12/2016   CHOL 164 03/20/2016   TRIG 61.0 03/20/2016   HDL 56.90 03/20/2016   LDLCALC 95 03/20/2016   ALT 10 03/20/2016   AST 11 03/20/2016   NA 138 06/12/2016   K 4.6 06/12/2016   CL 107 06/12/2016   CREATININE 0.63 06/12/2016   BUN 16 06/12/2016   CO2 25 06/12/2016   TSH 9.85 (H) 03/20/2016   HGBA1C 5.3 03/20/2016    Assessment & Plan:   Problem List Items Addressed This Visit    Right knee pain    New problem.  Suspect sprain and associated hamstring strain. Not improving with conservative care. Cannot take NSAIDs due to after surgery. Prednisone taper.      Hamstring strain    New problem. No  improvement with conservative care. Prednisone taper.         Meds ordered this encounter  Medications  . predniSONE (STERAPRED UNI-PAK 21 TAB) 10 MG (21) TBPK tablet    Sig: Per package instructions.    Dispense:  21 tablet    Refill:  0    Follow-up: PRN  Everlene OtherJayce Brysen Shankman DO [facility] [place]

## 2016-09-03 ENCOUNTER — Other Ambulatory Visit (HOSPITAL_COMMUNITY): Payer: BC Managed Care – PPO

## 2016-09-04 ENCOUNTER — Other Ambulatory Visit (HOSPITAL_COMMUNITY): Payer: BC Managed Care – PPO

## 2016-09-07 ENCOUNTER — Other Ambulatory Visit (HOSPITAL_COMMUNITY): Payer: BC Managed Care – PPO

## 2016-09-08 ENCOUNTER — Other Ambulatory Visit (HOSPITAL_COMMUNITY): Payer: BC Managed Care – PPO

## 2016-09-09 ENCOUNTER — Ambulatory Visit (HOSPITAL_COMMUNITY): Payer: Self-pay | Admitting: Psychiatry

## 2016-09-09 ENCOUNTER — Other Ambulatory Visit (HOSPITAL_COMMUNITY): Payer: BC Managed Care – PPO

## 2016-09-10 ENCOUNTER — Other Ambulatory Visit (HOSPITAL_COMMUNITY): Payer: BC Managed Care – PPO

## 2016-09-11 ENCOUNTER — Other Ambulatory Visit (HOSPITAL_COMMUNITY): Payer: BC Managed Care – PPO

## 2016-09-14 ENCOUNTER — Other Ambulatory Visit (HOSPITAL_COMMUNITY): Payer: BC Managed Care – PPO

## 2016-09-15 ENCOUNTER — Other Ambulatory Visit (HOSPITAL_COMMUNITY): Payer: BC Managed Care – PPO

## 2016-09-16 ENCOUNTER — Other Ambulatory Visit (HOSPITAL_COMMUNITY): Payer: BC Managed Care – PPO

## 2016-10-06 ENCOUNTER — Other Ambulatory Visit: Payer: Self-pay | Admitting: Hematology and Oncology

## 2016-10-06 NOTE — Progress Notes (Signed)
[facility]  [facility]  Clinic day:  10/07/16  Chief Complaint: Sharon Gibson is a 36 y.o. female s/p gastric bypass surgery with subsequent B12 and iron deficiency who is seen for 6 month assessment.  HPI:  The patient was last seen in the hematology clinic on 04/08/2016.  At that time, she was seen for initial consultation.  She was fatigued and short of breath.  Labs revealed a hematocrit of 38.7, hemoglobin 12.9, and MCV 83.7.  Ferritin was 8.  Iron saturation was 7% with a TIBC of 412.  Folate was 31.  B12 was 531.    She received Venofer 200 mg IV with premedications (Tylenol, Benadryl, and Decadron) on 05/13/2016.  She contacted clinic regarding side effects of dizziness, lightheadedness, low blood pressure, excess sweating, aches, and pains, nausea, and headache.  Labs on 07/08/2016 revealed a hematocrit of 38.8, hemoglobin 12.9, and MCV 84.1.  Ferritin was 22.  Symptomatically, she has felt more fatigued.  B21 injections stopped around last time.  She has not had sleep apnea testing.     Past Medical History:  Diagnosis Date  . Anxiety   . Chicken pox   . Depression   . Eating disorder    Has had residential treatment and hospitalization previously.  Marland Kitchen History of anemia   . History of pulmonary embolus (PE)   . Hypothyroidism   . PTSD (post-traumatic stress disorder)     Past Surgical History:  Procedure Laterality Date  . CHOLECYSTECTOMY  2004  . GASTRIC BYPASS      Family History  Problem Relation Age of Onset  . Hypertension Mother   . Hypertension Father   . Mental retardation Father   . Diabetes Father   . Anxiety disorder Father   . Prostate cancer Paternal Grandfather     Social History:  reports that she has never smoked. She has never used smokeless tobacco. She reports that she does not drink alcohol or use drugs.  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   She lives in [place], [place].  She works as a Product manager at [employer].  The patient is alone today.  Allergies:  Allergies  Allergen Reactions  . Penicillins Hives  . Azithromycin   . Other     Narcotics- Burning in the stomach (whether PO or IV)    Current Medications: Current Outpatient Prescriptions  Medication Sig Dispense Refill  . clonazePAM (KLONOPIN) 1 MG tablet Take 1 tablet (1 mg total) by mouth at bedtime. 30 tablet 2  . gabapentin (NEURONTIN) 300 MG capsule Take 1 capsule (300 mg total) by mouth 3 (three) times daily. 90 capsule 2  . sertraline (ZOLOFT) 100 MG tablet Take 3 tablets (300 mg total) by mouth daily. 90 tablet 2  . Vitamin D, Ergocalciferol, (DRISDOL) 50000 units CAPS capsule Take 1 capsule (50,000 Units total) by mouth every 7 (seven) days. 12 capsule 0  . diphenhydrAMINE (BENADRYL) 50 MG tablet Take 50 mg by mouth at bedtime as needed.     No current facility-administered medications for this visit.     Review of Systems:  GENERAL:  Fatigue.  No fevers or sweats.  Refuses weight check. PERFORMANCE STATUS (ECOG):  1 HEENT:  No visual changes, runny nose, sore throat, mouth sores or tenderness. Lungs: No shortness of breath or cough.  No hemoptysis. Cardiac:  No chest pain, palpitations, orthopnea, or PND. GI:  No nausea, vomiting, diarrhea, constipation, melena  or hematochezia. GU:  No urgency, frequency, dysuria, or hematuria.  Heavy menses prior to IUD. Musculoskeletal:  No back pain.  No joint pain.  No muscle tenderness. Extremities:  No pain or swelling. Skin:  Bruising on legs.  No rashes or skin changes. Neuro:  Poor sleep, on Trazodone.  Word finding issues and trouble focusing felt secondary to iron deficiency.  No headache, numbness or weakness, balance or coordination issues. Endocrine:  No diabetes, thyroid issues, hot flashes or night sweats. Psych:  No mood changes, depression or anxiety. Pain:  No focal pain. Review of systems:  All  other systems reviewed and found to be negative.  Physical Exam: Blood pressure 125/79, pulse 65, temperature 98.1 F (36.7 C), temperature source Tympanic, resp. rate 18. GENERAL:  Well developed, well nourished, heavyset woman sitting comfortably in the exam room in no acute distress. MENTAL STATUS:  Alert and oriented to person, place and time. HEAD:  Red hair pulled up.  Normocephalic, atraumatic, face symmetric, no Cushingoid features. EYES:  Glasses.  Blue eyes.  Pupils equal round and reactive to light and accomodation.  No conjunctivitis or scleral icterus. ENT:  Oropharynx clear without lesion.  Tongue normal. Mucous membranes moist.  RESPIRATORY:  Clear to auscultation without rales, wheezes or rhonchi. CARDIOVASCULAR:  Regular rate and rhythm without murmur, rub or gallop. ABDOMEN:  Fully round.  Soft, non-tender, with active bowel sounds, and no appreciable hepatosplenomegaly.  No masses. SKIN:  No petechiae.  No rashes, ulcers or lesions. EXTREMITIES:  Chronic lower extremity changes.  No skin discoloration or tenderness.  No palpable cords. LYMPH NODES: No palpable cervical, supraclavicular, axillary or inguinal adenopathy  NEUROLOGICAL: Unremarkable. PSYCH:  Appropriate.   Appointment on 10/07/2016  Component Date Value Ref Range Status  . WBC 10/07/2016 7.8  3.6 - 11.0 K/uL Final  . RBC 10/07/2016 4.96  3.80 - 5.20 MIL/uL Final  . Hemoglobin 10/07/2016 13.6  12.0 - 16.0 g/dL Final  . HCT 16/10/960401/06/2017 42.3  35.0 - 47.0 % Final  . MCV 10/07/2016 85.3  80.0 - 100.0 fL Final  . MCH 10/07/2016 27.5  26.0 - 34.0 pg Final  . MCHC 10/07/2016 32.3  32.0 - 36.0 g/dL Final  . RDW 54/09/811901/06/2017 14.1  11.5 - 14.5 % Final  . Platelets 10/07/2016 288  150 - 440 K/uL Final  . Neutrophils Relative % 10/07/2016 67  % Final  . Neutro Abs 10/07/2016 5.2  1.4 - 6.5 K/uL Final  . Lymphocytes Relative 10/07/2016 23  % Final  . Lymphs Abs 10/07/2016 1.8  1.0 - 3.6 K/uL Final  . Monocytes  Relative 10/07/2016 7  % Final  . Monocytes Absolute 10/07/2016 0.6  0.2 - 0.9 K/uL Final  . Eosinophils Relative 10/07/2016 2  % Final  . Eosinophils Absolute 10/07/2016 0.2  0 - 0.7 K/uL Final  . Basophils Relative 10/07/2016 1  % Final  . Basophils Absolute 10/07/2016 0.1  0 - 0.1 K/uL Final    Assessment:  Enrigue CatenaKristen Wunschel is a 36 y.o. female with B12 and iron deficiency anemia s/p gastric bypass surgery (2004).  [place]) [place] with reactions requiring Benadryl and steroids.  Labs on 03/20/2016 revealed a normal hematocrit but iron deficiency (ferritin 8).  B12 was 185 (low).  She began B12 on 04/07/2016.  She received Venofer 200 mg IV with premedications (Tylenol, Benadryl, and Decadron) on 05/13/2016.  She had side effects of dizziness,  lightheadedness, low blood pressure, excess sweating, aches, and pains, nausea, and headache after her infusion.  She has a history of multiple pulmonary emboli in 2003.  Per patient report, hypercoagulable work-up was negative.  She was on Coumadin x 6 months.  She has a history of easy bruising and heavy menses.  She has an IUD.  Symptomatically, she is fatigued.  She does not feel refreshed when she wakes up in the morning (? sleep apnea).  Exam is stable.  Hematocrit is 42.3.  Plan: 1.  Labs today:  CBC with diff, ferritin, iron studies. 2.  Nurse to call patient with today's lab results. 3.  Preauth B12. 4.  RTC monthly for B12 once preauth obtained. 5.  RTC in 3 months for labs (CBC with diff, ferritin). 6.  RTC in 6 months for MD assessment and labs (CBC with diff, ferritin, iron studies).   Rosey Bath, MD  10/07/2016, 9:35 AM

## 2016-10-07 ENCOUNTER — Encounter: Payer: Self-pay | Admitting: Hematology and Oncology

## 2016-10-07 ENCOUNTER — Inpatient Hospital Stay: Payer: BC Managed Care – PPO | Attending: Hematology and Oncology

## 2016-10-07 ENCOUNTER — Inpatient Hospital Stay (HOSPITAL_BASED_OUTPATIENT_CLINIC_OR_DEPARTMENT_OTHER): Payer: BC Managed Care – PPO | Admitting: Hematology and Oncology

## 2016-10-07 ENCOUNTER — Other Ambulatory Visit: Payer: Self-pay | Admitting: *Deleted

## 2016-10-07 ENCOUNTER — Telehealth: Payer: Self-pay | Admitting: *Deleted

## 2016-10-07 VITALS — BP 125/79 | HR 65 | Temp 98.1°F | Resp 18

## 2016-10-07 DIAGNOSIS — F4310 Post-traumatic stress disorder, unspecified: Secondary | ICD-10-CM | POA: Diagnosis not present

## 2016-10-07 DIAGNOSIS — Z9884 Bariatric surgery status: Secondary | ICD-10-CM | POA: Insufficient documentation

## 2016-10-07 DIAGNOSIS — E538 Deficiency of other specified B group vitamins: Secondary | ICD-10-CM | POA: Insufficient documentation

## 2016-10-07 DIAGNOSIS — F419 Anxiety disorder, unspecified: Secondary | ICD-10-CM | POA: Insufficient documentation

## 2016-10-07 DIAGNOSIS — D509 Iron deficiency anemia, unspecified: Secondary | ICD-10-CM | POA: Diagnosis not present

## 2016-10-07 DIAGNOSIS — Z86711 Personal history of pulmonary embolism: Secondary | ICD-10-CM | POA: Diagnosis not present

## 2016-10-07 DIAGNOSIS — E039 Hypothyroidism, unspecified: Secondary | ICD-10-CM

## 2016-10-07 DIAGNOSIS — F329 Major depressive disorder, single episode, unspecified: Secondary | ICD-10-CM

## 2016-10-07 DIAGNOSIS — R0602 Shortness of breath: Secondary | ICD-10-CM

## 2016-10-07 DIAGNOSIS — Z8042 Family history of malignant neoplasm of prostate: Secondary | ICD-10-CM | POA: Diagnosis not present

## 2016-10-07 DIAGNOSIS — F509 Eating disorder, unspecified: Secondary | ICD-10-CM | POA: Diagnosis not present

## 2016-10-07 DIAGNOSIS — Z79899 Other long term (current) drug therapy: Secondary | ICD-10-CM | POA: Insufficient documentation

## 2016-10-07 DIAGNOSIS — R5383 Other fatigue: Secondary | ICD-10-CM

## 2016-10-07 LAB — CBC WITH DIFFERENTIAL/PLATELET
Basophils Absolute: 0.1 10*3/uL (ref 0–0.1)
Basophils Relative: 1 %
Eosinophils Absolute: 0.2 10*3/uL (ref 0–0.7)
Eosinophils Relative: 2 %
HCT: 42.3 % (ref 35.0–47.0)
Hemoglobin: 13.6 g/dL (ref 12.0–16.0)
Lymphocytes Relative: 23 %
Lymphs Abs: 1.8 10*3/uL (ref 1.0–3.6)
MCH: 27.5 pg (ref 26.0–34.0)
MCHC: 32.3 g/dL (ref 32.0–36.0)
MCV: 85.3 fL (ref 80.0–100.0)
Monocytes Absolute: 0.6 10*3/uL (ref 0.2–0.9)
Monocytes Relative: 7 %
Neutro Abs: 5.2 10*3/uL (ref 1.4–6.5)
Neutrophils Relative %: 67 %
Platelets: 288 10*3/uL (ref 150–440)
RBC: 4.96 MIL/uL (ref 3.80–5.20)
RDW: 14.1 % (ref 11.5–14.5)
WBC: 7.8 10*3/uL (ref 3.6–11.0)

## 2016-10-07 LAB — IRON AND TIBC
Iron: 63 ug/dL (ref 28–170)
Saturation Ratios: 17 % (ref 10.4–31.8)
TIBC: 374 ug/dL (ref 250–450)
UIBC: 311 ug/dL

## 2016-10-07 LAB — FERRITIN: Ferritin: 18 ng/mL (ref 11–307)

## 2016-10-07 NOTE — Telephone Encounter (Signed)
-----   Message from Rosey BathMelissa C Corcoran, MD sent at 10/07/2016  4:39 PM EST ----- Regarding: Please call patient  Iron saturation and TIBC are good (iron saturation was low last time when she got iron).  Ferritin a little low, but ok.  I don't feel that she needs IV iron now as she had a reaction last time.  Ferritin is less than 30, but technically "normal" per our lab.  She will likely need IV iron in 3 months.  M   ----- Message ----- From: Interface, [facility] [place] Sent: 10/07/2016   9:08 AM To: Rosey BathMelissa C Corcoran, MD

## 2016-10-07 NOTE — Telephone Encounter (Signed)
Called patient to inform her that she does not need IV iron at this time.  Per our lab her iron and ferritin are normal.  She most likely will need iron in about 3 months.  Next lab appointment is in March.  Will recheck at that time.

## 2016-10-07 NOTE — Progress Notes (Signed)
Patient is concerned about her iron levels.  Patient states she is feeling more fatigued.

## 2016-10-09 ENCOUNTER — Telehealth: Payer: Self-pay | Admitting: Family Medicine

## 2016-10-09 NOTE — Telephone Encounter (Signed)
Pt was caleld and stated the note needs to include that she is able to go back to work on 10/13/16 for the hours of 7:45 a.m-4:15 p.m.

## 2016-10-09 NOTE — Telephone Encounter (Signed)
Fill out and I will sign.

## 2016-10-09 NOTE — Telephone Encounter (Addendum)
Letter has been written pt aware that she can pick up letter. Pt stated that her hematologist sent over a request for pt to have a take home sleep study done due to her having lack of sleep.

## 2016-10-09 NOTE — Telephone Encounter (Signed)
Pt called and needs a note to return to work for PTSD. No appts available today or Monday. Please advise, thank you!  Call pt @ [phone]

## 2016-10-09 NOTE — Telephone Encounter (Signed)
Okay to give note to return. Please call her and get details and what she needs note to say.

## 2016-10-09 NOTE — Telephone Encounter (Signed)
Do want her to be seen?

## 2016-10-10 NOTE — Telephone Encounter (Signed)
I have not received anything regarding this. This is likely related to anxiety/PTSD/other issues.  If she desires a sleep study we can arrange.
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
## 2016-10-12 ENCOUNTER — Ambulatory Visit (INDEPENDENT_AMBULATORY_CARE_PROVIDER_SITE_OTHER): Payer: BC Managed Care – PPO | Admitting: Psychiatry

## 2016-10-12 ENCOUNTER — Other Ambulatory Visit: Payer: Self-pay | Admitting: Family Medicine

## 2016-10-12 ENCOUNTER — Encounter (HOSPITAL_COMMUNITY): Payer: Self-pay | Admitting: Psychiatry

## 2016-10-12 VITALS — BP 122/76 | HR 68 | Ht 62.0 in

## 2016-10-12 DIAGNOSIS — F332 Major depressive disorder, recurrent severe without psychotic features: Secondary | ICD-10-CM | POA: Diagnosis not present

## 2016-10-12 DIAGNOSIS — F509 Eating disorder, unspecified: Secondary | ICD-10-CM | POA: Diagnosis not present

## 2016-10-12 DIAGNOSIS — F411 Generalized anxiety disorder: Secondary | ICD-10-CM | POA: Insufficient documentation

## 2016-10-12 DIAGNOSIS — G4700 Insomnia, unspecified: Secondary | ICD-10-CM

## 2016-10-12 DIAGNOSIS — Z880 Allergy status to penicillin: Secondary | ICD-10-CM

## 2016-10-12 DIAGNOSIS — Z8249 Family history of ischemic heart disease and other diseases of the circulatory system: Secondary | ICD-10-CM

## 2016-10-12 DIAGNOSIS — Z79899 Other long term (current) drug therapy: Secondary | ICD-10-CM

## 2016-10-12 DIAGNOSIS — Z9889 Other specified postprocedural states: Secondary | ICD-10-CM | POA: Diagnosis not present

## 2016-10-12 DIAGNOSIS — Z833 Family history of diabetes mellitus: Secondary | ICD-10-CM

## 2016-10-12 DIAGNOSIS — Z9049 Acquired absence of other specified parts of digestive tract: Secondary | ICD-10-CM

## 2016-10-12 DIAGNOSIS — Z8042 Family history of malignant neoplasm of prostate: Secondary | ICD-10-CM

## 2016-10-12 DIAGNOSIS — Z888 Allergy status to other drugs, medicaments and biological substances status: Secondary | ICD-10-CM

## 2016-10-12 MED ORDER — SERTRALINE HCL 100 MG PO TABS: 300.0000 mg | ORAL_TABLET | Freq: Every day | ORAL | 0 refills | Status: DC

## 2016-10-12 MED ORDER — CLONAZEPAM 1 MG PO TABS: 1.0000 mg | ORAL_TABLET | Freq: Every day | ORAL | 2 refills | Status: DC

## 2016-10-12 MED ORDER — GABAPENTIN 100 MG PO CAPS: 200.0000 mg | ORAL_CAPSULE | Freq: Every day | ORAL | 0 refills | Status: DC

## 2016-10-12 NOTE — Patient Instructions (Signed)
1. Continue sertraline 300 mg daily 2. Continue gabapentin 200 mg at night 3. Continue clonazepam 1 mg at night 4. Return to clinic in one month

## 2016-10-12 NOTE — Telephone Encounter (Signed)
Pt would like for this to be ordered.

## 2016-10-14 ENCOUNTER — Ambulatory Visit: Payer: Self-pay

## 2016-10-16 ENCOUNTER — Ambulatory Visit (INDEPENDENT_AMBULATORY_CARE_PROVIDER_SITE_OTHER): Payer: BC Managed Care – PPO | Admitting: Family Medicine

## 2016-10-16 ENCOUNTER — Encounter: Payer: Self-pay | Admitting: Family Medicine

## 2016-10-16 DIAGNOSIS — R21 Rash and other nonspecific skin eruption: Secondary | ICD-10-CM | POA: Diagnosis not present

## 2016-10-16 MED ORDER — TRIAMCINOLONE ACETONIDE 0.1 % EX OINT: 1.0000 "application " | TOPICAL_OINTMENT | Freq: Two times a day (BID) | CUTANEOUS | 0 refills | Status: DC

## 2016-10-16 NOTE — Progress Notes (Signed)
Pre visit review using our clinic review tool, if applicable. No additional management support is needed unless otherwise documented below in the visit note. 

## 2016-10-16 NOTE — Patient Instructions (Signed)
CeraVe (off brand if you like) daily.  Hydrocortisone on the face (1-2 times daily).  Triamcinolone on the other areas.  Take care  Dr. Adriana Simasook

## 2016-10-16 NOTE — Progress Notes (Signed)
   Subjective:  Patient ID: Sharon Gibson, female    DOB: 08-06-1981  Age: 36 y.o. MRN: [id number]  CC: Rash  HPI:  36 year old female presents with complaints of rash.  Rash  Started on Wednesday.  Nonpruritic.  Associated dry skin.  Located on the arms, trunk, face.  No new exposures. No new prescription medications. No over-the-counter meds. No changes in makeup, facial rash, etc.  No known exacerbating or relieving factors.  No other associated symptoms.  No other complaints or concerns at this time.  Social Hx   Social History   Social History  . Marital status: Single    Spouse name: N/A  . Number of children: N/A  . Years of education: N/A   Social History Main Topics  . Smoking status: Never Smoker  . Smokeless tobacco: Never Used  . Alcohol use No  . Drug use: No  . Sexual activity: No   Other Topics Concern  . None   Social History Narrative  . None    Review of Systems  Constitutional: Negative.   Skin: Positive for rash.   Objective:  BP 112/72   Pulse 72   Temp 98.2 F (36.8 C) (Oral)   Resp 14   SpO2 97%   BP/Weight 10/16/2016 10/07/2016 09/02/2016  Systolic BP 112 125 116  Diastolic BP 72 79 64  Wt. (Lbs) - - -  Some encounter information is confidential and restricted. Go to Review Flowsheets activity to see all data.   Physical Exam  Constitutional: She is oriented to person, place, and time. She appears well-developed. No distress.  Pulmonary/Chest: Effort normal.  Neurological: She is alert and oriented to person, place, and time.  Skin:  Mild facial erythema (cheeks). Dry skin noted. Papular rash noted on L forearm.   Psychiatric: She has a normal mood and affect.  Vitals reviewed.  Lab Results  Component Value Date   WBC 7.8 10/07/2016   HGB 13.6 10/07/2016   HCT 42.3 10/07/2016   PLT 288 10/07/2016   GLUCOSE 88 06/12/2016   CHOL 164 03/20/2016   TRIG 61.0 03/20/2016   HDL 56.90 03/20/2016   LDLCALC 95  03/20/2016   ALT 10 03/20/2016   AST 11 03/20/2016   NA 138 06/12/2016   K 4.6 06/12/2016   CL 107 06/12/2016   CREATININE 0.63 06/12/2016   BUN 16 06/12/2016   CO2 25 06/12/2016   TSH 9.85 (H) 03/20/2016   HGBA1C 5.3 03/20/2016    Assessment & Plan:   Problem List Items Addressed This Visit    Rash    New problem. Uncertain etiology. No new exposures.  Appears mild.  OTC hydrocortisone for the face. Topical triamcinolone for the trunk and extremities. Recommended CeraVe for dry skin.         Meds ordered this encounter  Medications  . triamcinolone ointment (KENALOG) 0.1 %    Sig: Apply 1 application topically 2 (two) times daily.    Dispense:  30 g    Refill:  0    Follow-up: PRN  Everlene OtherJayce Sanders Manninen DO [facility] [place]

## 2016-10-16 NOTE — Assessment & Plan Note (Signed)
New problem. Uncertain etiology. No new exposures.  Appears mild.  OTC hydrocortisone for the face. Topical triamcinolone for the trunk and extremities. Recommended CeraVe for dry skin.

## 2016-11-18 ENCOUNTER — Ambulatory Visit (HOSPITAL_COMMUNITY): Payer: Self-pay | Admitting: Psychiatry

## 2016-11-18 ENCOUNTER — Inpatient Hospital Stay: Payer: BC Managed Care – PPO | Attending: Hematology and Oncology

## 2016-11-26 ENCOUNTER — Ambulatory Visit (HOSPITAL_COMMUNITY): Payer: Self-pay | Admitting: Psychiatry

## 2016-12-16 ENCOUNTER — Telehealth: Payer: Self-pay | Admitting: *Deleted

## 2016-12-16 ENCOUNTER — Other Ambulatory Visit: Payer: Self-pay | Admitting: Hematology and Oncology

## 2016-12-16 ENCOUNTER — Inpatient Hospital Stay: Payer: BC Managed Care – PPO | Attending: Hematology and Oncology

## 2016-12-16 ENCOUNTER — Inpatient Hospital Stay: Payer: BC Managed Care – PPO

## 2016-12-16 VITALS — BP 91/62 | HR 63 | Temp 96.0°F | Resp 20

## 2016-12-16 DIAGNOSIS — D509 Iron deficiency anemia, unspecified: Secondary | ICD-10-CM

## 2016-12-16 DIAGNOSIS — Z79899 Other long term (current) drug therapy: Secondary | ICD-10-CM | POA: Diagnosis not present

## 2016-12-16 DIAGNOSIS — E538 Deficiency of other specified B group vitamins: Secondary | ICD-10-CM | POA: Insufficient documentation

## 2016-12-16 LAB — CBC WITH DIFFERENTIAL/PLATELET
Basophils Absolute: 0.1 10*3/uL (ref 0–0.1)
Basophils Relative: 1 %
Eosinophils Absolute: 0.2 10*3/uL (ref 0–0.7)
Eosinophils Relative: 2 %
HCT: 40.2 % (ref 35.0–47.0)
Hemoglobin: 13.2 g/dL (ref 12.0–16.0)
Lymphocytes Relative: 21 %
Lymphs Abs: 1.9 10*3/uL (ref 1.0–3.6)
MCH: 27.7 pg (ref 26.0–34.0)
MCHC: 32.8 g/dL (ref 32.0–36.0)
MCV: 84.6 fL (ref 80.0–100.0)
Monocytes Absolute: 0.6 10*3/uL (ref 0.2–0.9)
Monocytes Relative: 7 %
Neutro Abs: 6.1 10*3/uL (ref 1.4–6.5)
Neutrophils Relative %: 69 %
Platelets: 294 10*3/uL (ref 150–440)
RBC: 4.75 MIL/uL (ref 3.80–5.20)
RDW: 13.5 % (ref 11.5–14.5)
WBC: 8.9 10*3/uL (ref 3.6–11.0)

## 2016-12-16 LAB — FERRITIN: Ferritin: 10 ng/mL — ABNORMAL LOW (ref 11–307)

## 2016-12-16 MED ORDER — CYANOCOBALAMIN 1000 MCG/ML IJ SOLN
1000.0000 ug | Freq: Once | INTRAMUSCULAR | Status: AC
  Administered 2016-12-16: 1000 ug via INTRAMUSCULAR

## 2016-12-16 NOTE — Telephone Encounter (Signed)
Patient called to inform us that she will be starting a new job and if she needs iron she will need to get it before May 4th, because of the change in insurance coverage.

## 2016-12-17 ENCOUNTER — Telehealth: Payer: Self-pay | Admitting: *Deleted

## 2016-12-17 NOTE — Telephone Encounter (Signed)
-----   Message from Rosey BathMelissa C Corcoran, MD sent at 12/17/2016  1:17 PM EDT ----- Regarding: Please call patient  Hematocrit still normal.   Ferritin 10 (low).  Could proceed with Venofer with premeds.  Patient has had reactions with IV iron.  M  ----- Message ----- From: Interface, [facility] [place] Sent: 12/16/2016   3:50 PM To: Rosey BathMelissa C Corcoran, MD

## 2016-12-17 NOTE — Telephone Encounter (Signed)
Called patient to inform her that her ferritin is still low.  MD recommends Venofer with premeds, since patient has had problems with IV iron.  Patient is in agreement.

## 2016-12-28 ENCOUNTER — Other Ambulatory Visit: Payer: Self-pay | Admitting: Hematology and Oncology

## 2016-12-31 ENCOUNTER — Inpatient Hospital Stay: Payer: BC Managed Care – PPO | Attending: Hematology and Oncology

## 2016-12-31 VITALS — BP 116/65 | HR 80 | Resp 20

## 2016-12-31 DIAGNOSIS — D509 Iron deficiency anemia, unspecified: Secondary | ICD-10-CM

## 2016-12-31 DIAGNOSIS — Z79899 Other long term (current) drug therapy: Secondary | ICD-10-CM | POA: Diagnosis not present

## 2016-12-31 MED ORDER — DEXAMETHASONE SODIUM PHOSPHATE 10 MG/ML IJ SOLN
10.0000 mg | Freq: Once | INTRAMUSCULAR | Status: AC
  Administered 2016-12-31: 10 mg via INTRAVENOUS
  Filled 2016-12-31: qty 1

## 2016-12-31 MED ORDER — SODIUM CHLORIDE 0.9 % IV SOLN
Freq: Once | INTRAVENOUS | Status: AC
  Administered 2016-12-31: 09:00:00 via INTRAVENOUS
  Filled 2016-12-31: qty 1000

## 2016-12-31 MED ORDER — SODIUM CHLORIDE 0.9 % IV SOLN: 10.0000 mg | Freq: Once | INTRAVENOUS | Status: DC

## 2016-12-31 MED ORDER — IRON SUCROSE 20 MG/ML IV SOLN
200.0000 mg | Freq: Once | INTRAVENOUS | Status: AC
  Administered 2016-12-31: 200 mg via INTRAVENOUS
  Filled 2016-12-31: qty 10

## 2016-12-31 MED ORDER — DIPHENHYDRAMINE HCL 25 MG PO CAPS
50.0000 mg | ORAL_CAPSULE | Freq: Once | ORAL | Status: AC
  Administered 2016-12-31: 50 mg via ORAL
  Filled 2016-12-31: qty 2

## 2016-12-31 MED ORDER — ACETAMINOPHEN 325 MG PO TABS
650.0000 mg | ORAL_TABLET | Freq: Once | ORAL | Status: AC
  Administered 2016-12-31: 650 mg via ORAL
  Filled 2016-12-31: qty 2

## 2017-01-13 ENCOUNTER — Telehealth (HOSPITAL_COMMUNITY): Payer: Self-pay | Admitting: *Deleted

## 2017-01-13 NOTE — Telephone Encounter (Signed)
Pt pharmacy [facility] requesting refills for pt Gabapentin 100 mg 2 caps QHS. Per pt chart, pt medication was last filled on 10-12-2016 with 180 caps 0 refill. Pt initial appt and only appt was 10-12-2016. Pt pharmacy number is [phone].

## 2017-01-13 NOTE — Telephone Encounter (Signed)
Called pt due to med request from pharmacy for refill. Per provider to have pt sch appt first. Called pt and lmtcb and office number provided.

## 2017-01-13 NOTE — Telephone Encounter (Signed)
No refill unless she has follow up appointment

## 2017-01-14 NOTE — Telephone Encounter (Signed)
Called pt to sch f/u appt per provider due to pharmacy requesting refills. Called pt at [phone] and it went to voicemail. A person with name of Sharon Gibson stated she was the manger for [employer]. Staff did not leave message. Staff called pt mother number on file and message came up stating number dial is not a working number

## 2017-01-19 NOTE — Telephone Encounter (Signed)
Called pt back due to previous message from pharmacy. Spoke with pt and informed her that provider would like for her to sch appt before mediations could be refilled. Pt sch appt for 01-25-2017 and pt stated she will fine with her medications until she see Dr.Hisada on 01-25-2017 and dont need refills.

## 2017-01-20 ENCOUNTER — Telehealth (HOSPITAL_COMMUNITY): Payer: Self-pay | Admitting: *Deleted

## 2017-01-20 ENCOUNTER — Inpatient Hospital Stay: Payer: BC Managed Care – PPO

## 2017-01-20 NOTE — Telephone Encounter (Signed)
Pt pharmacy [facility] [phone] requesting refills for pt Gabapentin 100 mg. Pt one and only appt with provider was 10-12-2016. Provider filled 180 tablets with 0 refills. Pt f/u with provider is 01-25-2017.

## 2017-01-20 NOTE — Telephone Encounter (Signed)
Call in enough to last until appt 

## 2017-01-20 NOTE — Telephone Encounter (Signed)
Pt pharmacy [facility] [phone] requesting refills for pt Sertraline 100 mg TID. Pt one and only appt with provider was 10-12-2016. Provider filled 270 tablets with 0 refills. Pt f/u with provider is 01-25-2017.

## 2017-01-20 NOTE — Telephone Encounter (Signed)
Call in enough to last until appt

## 2017-01-21 ENCOUNTER — Ambulatory Visit: Payer: BC Managed Care – PPO

## 2017-01-21 NOTE — Telephone Encounter (Signed)
Spoke with pt and she stated she do not need refill and will wait until 01-25-2017 until Dr. Vanetta Shawl returns to office.

## 2017-01-21 NOTE — Telephone Encounter (Signed)
Per note in EPIC on 01-19-2017, pt stated she have enough medication to last her until 01-25-2017 and do not need refills for her Sertraline.

## 2017-01-25 ENCOUNTER — Ambulatory Visit (INDEPENDENT_AMBULATORY_CARE_PROVIDER_SITE_OTHER): Payer: BC Managed Care – PPO | Admitting: Psychiatry

## 2017-01-25 ENCOUNTER — Telehealth (HOSPITAL_COMMUNITY): Payer: Self-pay | Admitting: *Deleted

## 2017-01-25 ENCOUNTER — Encounter (HOSPITAL_COMMUNITY): Payer: Self-pay | Admitting: Psychiatry

## 2017-01-25 VITALS — BP 101/65 | HR 74 | Ht 62.0 in

## 2017-01-25 DIAGNOSIS — Z915 Personal history of self-harm: Secondary | ICD-10-CM

## 2017-01-25 DIAGNOSIS — F411 Generalized anxiety disorder: Secondary | ICD-10-CM

## 2017-01-25 DIAGNOSIS — F331 Major depressive disorder, recurrent, moderate: Secondary | ICD-10-CM

## 2017-01-25 DIAGNOSIS — F502 Bulimia nervosa: Secondary | ICD-10-CM

## 2017-01-25 DIAGNOSIS — Z79899 Other long term (current) drug therapy: Secondary | ICD-10-CM

## 2017-01-25 DIAGNOSIS — F4310 Post-traumatic stress disorder, unspecified: Secondary | ICD-10-CM

## 2017-01-25 MED ORDER — SERTRALINE HCL 100 MG PO TABS: 300.0000 mg | ORAL_TABLET | Freq: Every day | ORAL | 0 refills | Status: DC

## 2017-01-25 MED ORDER — CLONAZEPAM 1 MG PO TABS: 1.0000 mg | ORAL_TABLET | Freq: Every day | ORAL | 0 refills | Status: DC

## 2017-01-25 MED ORDER — GABAPENTIN 100 MG PO CAPS: 200.0000 mg | ORAL_CAPSULE | Freq: Every day | ORAL | 0 refills | Status: DC

## 2017-01-25 NOTE — Telephone Encounter (Signed)
Pt came into office f/u and was about to get pt weight and she stated she do not get weighed. Per pt she have an eating disorder and she do not get weighed. Per pt she have not been weighed in over 10 years.
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
## 2017-01-25 NOTE — Patient Instructions (Addendum)
1. Continue sertraline 300 mg daily 2. Continue gabapentin 200 mg at night 3. Continue clonazepam 1 mg at night 4. Return to clinic in one month
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
## 2017-02-17 ENCOUNTER — Ambulatory Visit: Payer: Self-pay

## 2017-02-23 ENCOUNTER — Ambulatory Visit (HOSPITAL_COMMUNITY): Payer: Self-pay | Admitting: Psychiatry

## 2017-02-25 ENCOUNTER — Telehealth (HOSPITAL_COMMUNITY): Payer: Self-pay | Admitting: *Deleted

## 2017-02-25 NOTE — Telephone Encounter (Signed)
Called pt and lmtcb to sch f/u appt due to pharmacy requesting refills for her Sertraline HCL 100 mg 3 tablets QD. Pt medication was last filled on 01-25-2017 which was the last time pt f/u with provider. Pt had an appt for 02-23-2017 but no showed. [phone]. Office phone number left on pt voicemail.

## 2017-02-25 NOTE — Telephone Encounter (Signed)
Opened in Error.

## 2017-03-16 NOTE — Progress Notes (Deleted)
[facility]-  Cancer Center  Clinic day:  03/16/17  Chief Complaint: Sharon Gibson is a 36 y.o. female s/p gastric bypass surgery with subsequent B12 and iron deficiency who is seen for 6 month assessment.  HPI:  The patient was last seen in the hematology clinic on 10/07/2016.  At that time, she felt more fatigued.  She had not been taking her B12 injections.  She had not had sleep apnea testing.  Hematocrit was 42.3.  Ferritin was 18.  CBC on 12/16/2016 revealed a hematocrit of 40.2, hemoglobin 13.2, and MCV 84.6. Ferritin was 10.  She received B12 on 12/16/2016 only since her last visit.  She received Venofer on 12/31/2016.     Past Medical History:  Diagnosis Date  . Anxiety   . Chicken pox   . Depression   . Eating disorder    Has had residential treatment and hospitalization previously.  Marland Kitchen History of anemia   . History of pulmonary embolus (PE)   . Hypothyroidism   . PTSD (post-traumatic stress disorder)     Past Surgical History:  Procedure Laterality Date  . CHOLECYSTECTOMY  2004  . GASTRIC BYPASS      Family History  Problem Relation Age of Onset  . Hypertension Mother   . Hypertension Father   . Mental retardation Father   . Diabetes Father   . Anxiety disorder Father   . Prostate cancer Paternal Grandfather     Social History:  reports that she has never smoked. She has never used smokeless tobacco. She reports that she does not drink alcohol or use drugs.  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  She lives in [place], [place].  She works as a Product manager at [employer].  The patient is alone today.  Allergies:  Allergies  Allergen Reactions  . Penicillins Hives  . Azithromycin   . Other     Narcotics- Burning in the stomach (whether PO or IV)    Current Medications: Current Outpatient Prescriptions  Medication Sig Dispense Refill  . clonazePAM (KLONOPIN) 1 MG  tablet Take 1 tablet (1 mg total) by mouth at bedtime. 90 tablet 0  . gabapentin (NEURONTIN) 100 MG capsule Take 2 capsules (200 mg total) by mouth at bedtime. 180 capsule 0  . sertraline (ZOLOFT) 100 MG tablet Take 3 tablets (300 mg total) by mouth daily. 270 tablet 0   No current facility-administered medications for this visit.     Review of Systems:  GENERAL:  Fatigue.  No fevers or sweats.  Refuses weight check. PERFORMANCE STATUS (ECOG):  1 HEENT:  No visual changes, runny nose, sore throat, mouth sores or tenderness. Lungs: No shortness of breath or cough.  No hemoptysis. Cardiac:  No chest pain, palpitations, orthopnea, or PND. GI:  No nausea, vomiting, diarrhea, constipation, melena or hematochezia. GU:  No urgency, frequency, dysuria, or hematuria.  Heavy menses prior to IUD. Musculoskeletal:  No back pain.  No joint pain.  No muscle tenderness. Extremities:  No pain or swelling. Skin:  Bruising on legs.  No rashes or skin changes. Neuro:  Poor sleep, on Trazodone.  Word finding issues and trouble focusing felt secondary to iron deficiency.  No headache, numbness or weakness, balance or coordination issues. Endocrine:  No diabetes, thyroid issues, hot flashes or night sweats. Psych:  No mood changes, depression or anxiety. Pain:  No focal pain. Review of systems:  All other systems reviewed and found  to be negative.  Physical Exam: There were no vitals taken for this visit. GENERAL:  Well developed, well nourished, heavyset woman sitting comfortably in the exam room in no acute distress. MENTAL STATUS:  Alert and oriented to person, place and time. HEAD:  Red hair pulled up.  Normocephalic, atraumatic, face symmetric, no Cushingoid features. EYES:  Glasses.  Blue eyes.  Pupils equal round and reactive to light and accomodation.  No conjunctivitis or scleral icterus. ENT:  Oropharynx clear without lesion.  Tongue normal. Mucous membranes moist.  RESPIRATORY:  Clear to  auscultation without rales, wheezes or rhonchi. CARDIOVASCULAR:  Regular rate and rhythm without murmur, rub or gallop. ABDOMEN:  Fully round.  Soft, non-tender, with active bowel sounds, and no appreciable hepatosplenomegaly.  No masses. SKIN:  No petechiae.  No rashes, ulcers or lesions. EXTREMITIES:  Chronic lower extremity changes.  No skin discoloration or tenderness.  No palpable cords. LYMPH NODES: No palpable cervical, supraclavicular, axillary or inguinal adenopathy  NEUROLOGICAL: Unremarkable. PSYCH:  Appropriate.   No visits with results within 3 Day(s) from this visit.  Latest known visit with results is:  Appointment on 12/16/2016  Component Date Value Ref Range Status  . WBC 12/16/2016 8.9  3.6 - 11.0 K/uL Final  . RBC 12/16/2016 4.75  3.80 - 5.20 MIL/uL Final  . Hemoglobin 12/16/2016 13.2  12.0 - 16.0 g/dL Final  . HCT 82/95/621303/21/2018 40.2  35.0 - 47.0 % Final  . MCV 12/16/2016 84.6  80.0 - 100.0 fL Final  . MCH 12/16/2016 27.7  26.0 - 34.0 pg Final  . MCHC 12/16/2016 32.8  32.0 - 36.0 g/dL Final  . RDW 08/65/784603/21/2018 13.5  11.5 - 14.5 % Final  . Platelets 12/16/2016 294  150 - 440 K/uL Final  . Neutrophils Relative % 12/16/2016 69  % Final  . Neutro Abs 12/16/2016 6.1  1.4 - 6.5 K/uL Final  . Lymphocytes Relative 12/16/2016 21  % Final  . Lymphs Abs 12/16/2016 1.9  1.0 - 3.6 K/uL Final  . Monocytes Relative 12/16/2016 7  % Final  . Monocytes Absolute 12/16/2016 0.6  0.2 - 0.9 K/uL Final  . Eosinophils Relative 12/16/2016 2  % Final  . Eosinophils Absolute 12/16/2016 0.2  0 - 0.7 K/uL Final  . Basophils Relative 12/16/2016 1  % Final  . Basophils Absolute 12/16/2016 0.1  0 - 0.1 K/uL Final  . Ferritin 12/16/2016 10* 11 - 307 ng/mL Final    Assessment:  Sharon Gibson is a 36 y.o. female with B12 and iron deficiency anemia s/p gastric bypass surgery (2004).  [place] has received [place]) [place];  [place] with reactions  requiring Benadryl and steroids.  Labs on 03/20/2016 revealed a normal hematocrit but iron deficiency (ferritin 8).  B12 was 185 (low).  She began B12 on 04/07/2016.  She received Venofer 200 mg IV with premedications (Tylenol, Benadryl, and Decadron) on 05/13/2016.  She had side effects of dizziness, lightheadedness, low blood pressure, excess sweating, aches, and pains, nausea, and headache after her infusion.  She has a history of multiple pulmonary emboli in 2003.  Per patient report, hypercoagulable work-up was negative.  She was on Coumadin x 6 months.  She has a history of easy bruising and heavy menses.  She has an IUD.  Symptomatically, she is fatigued.  She does not feel refreshed when she wakes up in the morning (? sleep apnea).  Exam is stable.  Hematocrit is 42.3.  Plan: 1.  Labs today:  CBC with diff, ferritin, iron studies.  2.  Nurse to call patient with today's lab results. 3.  Preauth B12. 4.  RTC monthly for B12 once preauth obtained. 5.  RTC in 3 months for labs (CBC with diff, ferritin). 6.  RTC in 6 months for MD assessment and labs (CBC with diff, ferritin, iron studies).   Rosey Bath, MD  03/16/2017, 8:41 PM

## 2017-03-17 ENCOUNTER — Inpatient Hospital Stay: Payer: BC Managed Care – PPO

## 2017-03-17 ENCOUNTER — Inpatient Hospital Stay: Payer: BC Managed Care – PPO | Attending: Hematology and Oncology | Admitting: Hematology and Oncology

## 2017-03-17 ENCOUNTER — Ambulatory Visit: Payer: BC Managed Care – PPO

## 2017-03-24 ENCOUNTER — Other Ambulatory Visit: Payer: Self-pay

## 2017-03-24 ENCOUNTER — Ambulatory Visit: Payer: Self-pay | Admitting: Hematology and Oncology

## 2017-03-24 ENCOUNTER — Ambulatory Visit: Payer: Self-pay

## 2017-04-21 ENCOUNTER — Other Ambulatory Visit: Payer: Self-pay

## 2017-04-21 ENCOUNTER — Ambulatory Visit: Payer: Self-pay | Admitting: Hematology and Oncology

## 2017-04-26 ENCOUNTER — Telehealth (HOSPITAL_COMMUNITY): Payer: Self-pay | Admitting: *Deleted

## 2017-04-26 NOTE — Telephone Encounter (Signed)
Pt [facility] [place] [facility]ed refill request to office for pt Sertraline HCL 100 mg three tabs Daily. Medication was last refilled on 01-25-2017 with 270 tabs and 0 refill. Pt pharmacy number is [phone].

## 2017-04-26 NOTE — Telephone Encounter (Signed)
Pt [facility] [place] [facility] refill request to office for pt Gabapentin 100 mg 2 caps QHS. Medication was last refilled on 01-25-2017 with 180 tabs 0 refill . Pt pharmacy number is [phone].

## 2017-04-27 NOTE — Telephone Encounter (Signed)
Called pt to sch f/u appt and pt voicemail box is full and staff is unable to lm.

## 2017-04-27 NOTE — Telephone Encounter (Signed)
No refill unless she has follow up appointment

## 2017-04-27 NOTE — Telephone Encounter (Signed)
Called pt to sch f/u appt and pt voicemail box is full and staff is unable to lm. 

## 2017-04-29 NOTE — Telephone Encounter (Signed)
Called pt number on file and lmtcb to sch f/u appt and office number was provided on voicemail

## 2017-06-08 ENCOUNTER — Telehealth (HOSPITAL_COMMUNITY): Payer: Self-pay | Admitting: *Deleted
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
## 2017-06-09 ENCOUNTER — Other Ambulatory Visit (HOSPITAL_COMMUNITY): Payer: Self-pay | Admitting: Psychiatry

## 2017-06-09 MED ORDER — SERTRALINE HCL 100 MG PO TABS: 300.0000 mg | ORAL_TABLET | Freq: Every day | ORAL | 0 refills | Status: DC

## 2017-06-09 MED ORDER — GABAPENTIN 100 MG PO CAPS: 200.0000 mg | ORAL_CAPSULE | Freq: Every day | ORAL | 0 refills | Status: DC

## 2017-06-09 NOTE — Telephone Encounter (Signed)
Called pt and was unable to reach her and staff  lmtcb and office number was provider on pt voicemail.

## 2017-06-09 NOTE — Telephone Encounter (Signed)
Ordered refill for sertraline and gabapentin for a month. She may decline to pick up gapapentin if she has not been taking this medication. Please inform her that no more refill to be made without evaluation. Hope stay safe.
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
## 2017-06-25 ENCOUNTER — Ambulatory Visit (HOSPITAL_COMMUNITY): Payer: Self-pay | Admitting: Psychiatry

## 2017-06-27 ENCOUNTER — Ambulatory Visit
Admission: EM | Admit: 2017-06-27 | Discharge: 2017-06-27 | Disposition: A | Discharge status: Home / Self Care | Payer: Self-pay | Attending: Family Medicine | Admitting: Family Medicine

## 2017-06-27 ENCOUNTER — Ambulatory Visit (INDEPENDENT_AMBULATORY_CARE_PROVIDER_SITE_OTHER): Payer: Self-pay

## 2017-06-27 DIAGNOSIS — M25531 Pain in right wrist: Secondary | ICD-10-CM

## 2017-06-27 MED ORDER — KETOROLAC TROMETHAMINE 60 MG/2ML IM SOLN
60.0000 mg | Freq: Once | INTRAMUSCULAR | Status: AC
  Administered 2017-06-27: 60 mg via INTRAMUSCULAR

## 2017-06-27 NOTE — ED Provider Notes (Signed)
[facility]    CSN: [id number] Arrival date & time: 06/27/17  1051     History   Chief Complaint Chief Complaint  Patient presents with  . Wrist Pain    right    HPI Sharon Gibson is a 36 y.o. female.   36 year old female presents with right wrist pain that started late last evening. Woke her up from sleep with the pain at 4am this morning. Having difficulty moving wrist and pain radiating to her elbow. Denies any numbness. 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 No distinct fall or direct injury. Has taken Ibuprofen with minimal relief. Also had injured her right arm a few years ago and had to have physical therapy but no chronic limitations and symptoms resolved. She is right hand dominant. Wants to evaluate this pain before it gets worse. Other chronic health issues include anxiety and depression and she is currently on medication for management. She also had a gastric bypass and is unable to take NSAIDs long-term.    The history is provided by the patient.    Past Medical History:  Diagnosis Date  . Anxiety   . Chicken pox   . Depression   . Eating disorder    Has had residential treatment and hospitalization previously.  Marland Kitchen History of anemia   . History of pulmonary embolus (PE)   . Hypothyroidism   . PTSD (post-traumatic stress disorder)     Patient Active Problem List   Diagnosis Date Noted  . Rash 10/16/2016  . Generalized anxiety disorder 10/12/2016  . Right knee pain 09/02/2016  . Severe recurrent major depression without psychotic features (HCC) 07/23/2016  . Bulimia nervosa, in partial remission, mild 07/23/2016    Class: Chronic  . Iron deficiency anemia 04/08/2016  . B12 deficiency 04/08/2016  . S/P gastric bypass 03/20/2016  . Eating disorder 03/20/2016  . Insomnia 03/20/2016  . PTSD (post-traumatic stress disorder)  03/20/2016  . History of hypothyroidism 03/20/2016    Past Surgical History:  Procedure Laterality Date  . CHOLECYSTECTOMY  2004  . GASTRIC BYPASS      OB History    No data available       Home Medications    Prior to Admission medications   Medication Sig Start Date End Date Taking? Authorizing Provider  gabapentin (NEURONTIN) 100 MG capsule Take 2 capsules (200 mg total) by mouth at bedtime. 06/09/17  Yes Neysa Hotter, MD  sertraline (ZOLOFT) 100 MG tablet Take 3 tablets (300 mg total) by mouth daily. 06/09/17 06/09/18 Yes Hisada, Barbee Cough, MD  clonazePAM (KLONOPIN) 1 MG tablet Take 1 tablet (1 mg total) by mouth at bedtime. 01/25/17 01/25/18  Neysa Hotter, MD    Family History Family History  Problem Relation Age of Onset  . Hypertension Mother   . Hypertension Father   . Mental retardation Father   . Diabetes Father   . Anxiety disorder Father   . Prostate cancer Paternal Grandfather     Social History Social History  Substance Use Topics  . Smoking status: Never Smoker  . Smokeless tobacco: Never Used  . Alcohol use No     Allergies   Penicillins; Azithromycin; and Other   Review of Systems Review of Systems  Constitutional: Negative for activity change, chills, fatigue and fever.  Gastrointestinal: Negative for nausea and vomiting.  Musculoskeletal: Positive for arthralgias, joint  swelling and myalgias. Negative for back pain, neck pain and neck stiffness.  Skin: Negative for color change, rash and wound.  Allergic/Immunologic: Negative for immunocompromised state.  Neurological: Negative for dizziness, tremors, syncope, speech difficulty, weakness, light-headedness, numbness and headaches.  Hematological: Negative for adenopathy. Does not bruise/bleed easily.     Physical Exam Triage Vital Signs ED Triage Vitals  Enc Vitals Group     BP 06/27/17 1116 110/61     Pulse Rate 06/27/17 1116 65     Resp 06/27/17 1116 18     Temp 06/27/17 1116 98.5 F  (36.9 C)     Temp Source 06/27/17 1116 Oral     SpO2 06/27/17 1116 100 %     Weight --      Height 06/27/17 1114  (1.6 m)     Head Circumference --      Peak Flow --      Pain Score 06/27/17 1114 6     Pain Loc --      Pain Edu? --      Excl. in GC? --    No data found.   Updated Vital Signs BP 110/61 (BP Location: Left Arm)   Pulse 65   Temp 98.5 F (36.9 C) (Oral)   Resp 18   Ht  (1.6 m)   LMP 06/13/2017   SpO2 100%   Visual Acuity Right Eye Distance:   Left Eye Distance:   Bilateral Distance:    Right Eye Near:   Left Eye Near:    Bilateral Near:     Physical Exam  Constitutional: She is oriented to person, place, and time. She appears well-developed and well-nourished. No distress.  HENT:  Head: Normocephalic and atraumatic.  Eyes: Conjunctivae and EOM are normal.  Neck: Normal range of motion.  Cardiovascular: Normal rate and regular rhythm.   Pulmonary/Chest: Effort normal.  Musculoskeletal: She exhibits tenderness. She exhibits no edema.       Right wrist: She exhibits tenderness and swelling. She exhibits normal range of motion, no effusion, no crepitus, no deformity and no laceration.       Right hand: She exhibits tenderness. She exhibits normal range of motion, normal two-point discrimination, normal capillary refill, no laceration and no swelling. Normal sensation noted. Normal strength noted.       Hands: Has full range of motion of right wrist but with pain with all movements, especially with flexion and internal rotation. Tender along ulnar and radial aspects of wrist, especially near base of thumb up to metacarpal. Good pulses and normal capillary refill. No neuro deficits noted.   Neurological: She is alert and oriented to person, place, and time. She has normal strength. No sensory deficit.  Skin: Skin is warm and dry. Capillary refill takes less than 2 seconds. No rash noted. No erythema.  Psychiatric: She has a normal mood and affect.  Her behavior is normal. Judgment and thought content normal.     UC Treatments / Results  Labs (all labs ordered are listed, but only abnormal results are displayed) Labs Reviewed - No data to display  EKG  EKG Interpretation None       Radiology Dg Wrist Complete Right  Result Date: 06/27/2017 CLINICAL DATA:  Medial wrist pain after falling at work last week. EXAM: RIGHT WRIST - COMPLETE 3+ VIEW COMPARISON:  None. FINDINGS: The mineralization and alignment are normal. There is no evidence of acute fracture or dislocation. The joint spaces are maintained. No focal soft  tissue abnormalities are seen. IMPRESSION: No acute osseous findings. Electronically Signed   By: Carey Bullocks M.D.   On: 06/27/2017 13:35    Procedures Procedures (including critical care time)  Medications Ordered in UC Medications  ketorolac (TORADOL) injection 60 mg (60 mg Intramuscular Given 06/27/17 1353)     Initial Impression / Assessment and Plan / UC Course  I have reviewed the triage vital signs and the nursing notes.  Pertinent labs & imaging results that were available during my care of the patient were reviewed by me and considered in my medical decision making (see chart for details).    Reviewed negative x-ray results with patient. Discussed that she probably strained her wrist or caused pain from overuse syndrome. Gave Toradol  IM today for pain and swelling. Recommend wear wrist splint for support during the day- may take off at night. May take Tylenol  every 8 hours as needed for pain. Note written for work for today. Follow-up with her PCP for further evaluation if pain and symptoms persist. Discussed that work restrictions will need to come from her PCP- pt understands. May need Orthopedic referral.    Final Clinical Impressions(s) / UC Diagnoses   Final diagnoses:  Right wrist pain    New Prescriptions Discharge Medication List as of 06/27/2017  1:56 PM        Controlled Substance Prescriptions [place] Controlled Substance Registry consulted? Not Applicable   Sudie Grumbling, NP 06/28/17 1037

## 2017-06-27 NOTE — ED Triage Notes (Signed)
Patient complains of right wrist pain. 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 Patient has had no known injury or fall.

## 2017-06-27 NOTE — Discharge Instructions (Addendum)
You were given a shot of Toradol today to help with pain and inflammation. Recommend wear wrist splint for support during the day- may take off at night. May take Tylenol  every 8 hours as needed for pain. Follow-up with your PCP for further evaluation if pain and symptoms persist. May need Orthopedic referral.

## 2017-07-06 ENCOUNTER — Encounter (HOSPITAL_COMMUNITY): Payer: Self-pay | Admitting: Psychiatry

## 2017-07-12 ENCOUNTER — Telehealth (HOSPITAL_COMMUNITY): Payer: Self-pay | Admitting: *Deleted

## 2017-07-12 NOTE — Telephone Encounter (Signed)
Pt pharmacy [facility] in [place] faxed refill request for pt Sertraline HCL 100 mg TID. Per pt chart, pt medication was last filled on 06-09-2017 with 90 tabs 0 refill. Pt no showed for her last appt on 06-25-2017 and do not have f/u appt. Pt pharmacy number is [phone].

## 2017-07-12 NOTE — Telephone Encounter (Signed)
Called pt and lmtcb to sch f/u appt with provider and office number provided.

## 2017-07-12 NOTE — Telephone Encounter (Signed)
Need an appointment first? 

## 2017-07-15 ENCOUNTER — Telehealth (HOSPITAL_COMMUNITY): Payer: Self-pay | Admitting: *Deleted

## 2017-07-15 NOTE — Telephone Encounter (Signed)
Per previous message on file, pt pharmacy is requesting refills for pt Gabapentin 100 mg and Sertraline HCl 100 mg 3 tabs QD. Per provider pt needs to sch f/u appt. Staff called pt again today to attempt to sch a f/u appt, pt phone rang 2 times and went to voicemail. Message came up stating mailbox is full.

## 2017-07-21 ENCOUNTER — Telehealth (HOSPITAL_COMMUNITY): Payer: Self-pay | Admitting: *Deleted

## 2017-07-21 NOTE — Telephone Encounter (Signed)
noted 

## 2017-07-21 NOTE — Telephone Encounter (Signed)
I believe that we informed her that no more refill to be made without evaluation since she was not seen since April. Will decline refill.

## 2017-07-21 NOTE — Telephone Encounter (Signed)
Pt pharmacy requesting refills for pt Gabapintin 100 mg. Per pt chart, pt medication was last filled on 06-09-2017 with 60 tabs 0 refill. Pt pharmacy number is [phone].

## 2017-09-15 ENCOUNTER — Encounter: Payer: Self-pay | Admitting: *Deleted

## 2017-09-15 ENCOUNTER — Other Ambulatory Visit: Payer: Self-pay

## 2017-09-15 ENCOUNTER — Emergency Department
Admission: EM | Admit: 2017-09-15 | Discharge: 2017-09-15 | Disposition: A | Discharge status: Home / Self Care | Payer: Self-pay | Attending: Emergency Medicine | Admitting: Emergency Medicine

## 2017-09-15 ENCOUNTER — Emergency Department: Payer: Self-pay

## 2017-09-15 DIAGNOSIS — M722 Plantar fascial fibromatosis: Secondary | ICD-10-CM

## 2017-09-15 DIAGNOSIS — Z79899 Other long term (current) drug therapy: Secondary | ICD-10-CM | POA: Insufficient documentation

## 2017-09-15 DIAGNOSIS — E039 Hypothyroidism, unspecified: Secondary | ICD-10-CM | POA: Insufficient documentation

## 2017-09-15 DIAGNOSIS — M79672 Pain in left foot: Secondary | ICD-10-CM | POA: Insufficient documentation

## 2017-09-15 MED ORDER — MELOXICAM 15 MG PO TABS: 15.0000 mg | ORAL_TABLET | Freq: Every day | ORAL | 2 refills | Status: DC

## 2017-09-15 NOTE — ED Triage Notes (Signed)
Pt to ED reporting left heel pain. Pt reports she had to walk more than normal for work last week. Since then pt reports having pain when walking as well as pain when resting. No deformity noted. No swelling.

## 2017-09-15 NOTE — Discharge Instructions (Signed)
Follow-up with your regular doctor or Dr. Ernest PineHooten if you are not better in 5-7 days, wear the Ace wrap and wooden shoe for extra support, use ice, Mobic daily for pain and inflammation, you can also take Tylenol.  If you are worsening please return to the emergency department

## 2017-09-15 NOTE — ED Provider Notes (Signed)
[facility] Emergency Department Provider Note  ____________________________________________   First MD Initiated Contact with Patient 09/15/17 1517     (approximate)  I have reviewed the triage vital signs and the nursing notes.   HISTORY  Chief Complaint Foot Pain    HPI Sharon Gibson is a 36 y.o. 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 Anxiety   . Chicken pox   . Depression   . Eating disorder    Has had residential treatment and hospitalization previously.  Marland Kitchen. History of anemia   . History of pulmonary embolus (PE)   . Hypothyroidism   . PTSD (post-traumatic stress disorder)     Patient Active Problem List   Diagnosis Date Noted  . Rash 10/16/2016  . Generalized anxiety disorder 10/12/2016  . Right knee pain 09/02/2016  . Severe recurrent major depression without psychotic features (HCC) 07/23/2016  . Bulimia nervosa, in partial remission, mild 07/23/2016    Class: Chronic  . Iron deficiency anemia 04/08/2016  . B12 deficiency 04/08/2016  . S/P gastric bypass 03/20/2016  . Eating disorder 03/20/2016  . Insomnia 03/20/2016  . PTSD (post-traumatic stress disorder) 03/20/2016  . History of hypothyroidism 03/20/2016    Past Surgical History:  Procedure Laterality Date  . CHOLECYSTECTOMY  2004  . GASTRIC BYPASS      Prior to Admission medications   Medication Sig Start Date End Date Taking? Authorizing Provider  clonazePAM (KLONOPIN) 1 MG tablet Take 1 tablet (1 mg total) by mouth at bedtime. 01/25/17 01/25/18  Neysa HotterHisada, Reina, MD  gabapentin (NEURONTIN) 100 MG capsule Take 2 capsules (200 mg total) by mouth  at bedtime. 06/09/17   Neysa HotterHisada, Reina, MD  meloxicam (MOBIC) 15 MG tablet Take 1 tablet (15 mg total) by mouth daily. 09/15/17 09/15/18  Sherrie MustacheFisher, Roselyn BeringSusan W, PA-C  sertraline (ZOLOFT) 100 MG tablet Take 3 tablets (300 mg total) by mouth daily. 06/09/17 06/09/18  Neysa HotterHisada, Reina, MD    Allergies Penicillins; Azithromycin; and Other  Family History  Problem Relation Age of Onset  . Hypertension Mother   . Hypertension Father   . Mental retardation Father   . Diabetes Father   . Anxiety disorder Father   . Prostate cancer Paternal Grandfather     Social History Social History   Tobacco Use  . Smoking status: Never Smoker  . Smokeless tobacco: Never Used  Substance Use Topics  . Alcohol use: No    Alcohol/week: 0.0 - 0.6 oz  . Drug use: No    Review of Systems  Constitutional: No fever/chills Eyes: No visual changes. ENT: No sore throat. Respiratory: Denies cough Genitourinary: Negative for dysuria. Musculoskeletal: Negative for back pain.  Positive for left foot pain Skin: Negative for rash.    ____________________________________________   PHYSICAL EXAM:  VITAL SIGNS: ED Triage Vitals  Enc Vitals Group     BP 09/15/17 1431 116/80     Pulse Rate 09/15/17 1431 71     Resp 09/15/17 1431 20     Temp 09/15/17 1431 97.7 F (36.5 C)     Temp Source 09/15/17 1431 Oral  SpO2 09/15/17 1431 100 %     Weight 09/15/17 1432 251 lb 4.8 oz (114 kg)     Height 09/15/17 1432 5\' 3"  (1.6 m)     Head Circumference --      Peak Flow --      Pain Score 09/15/17 1439 6     Pain Loc --      Pain Edu? --      Excl. in GC? --     Constitutional: Alert and oriented. Well appearing and in no acute distress. Eyes: Conjunctivae are normal.  Head: Atraumatic. Nose: No congestion/rhinnorhea.   Cardiovascular: Normal rate, regular rhythm. Respiratory: Normal respiratory effort.  No retractions GU: deferred Musculoskeletal: FROM all extremities, warm and well perfused, left foot is  tender along the fifth metatarsal and heel, she has full range of motion, neurovascular is intact Neurologic:  Normal speech and language.  Skin:  Skin is warm, dry and intact. No rash noted. Psychiatric: Mood and affect are normal. Speech and behavior are normal.  ____________________________________________   LABS (all labs ordered are listed, but only abnormal results are displayed)  Labs Reviewed - No data to display ____________________________________________   ____________________________________________  RADIOLOGY  X-ray of the left foot is negative for fracture, positive for calcaneal spur  ____________________________________________   PROCEDURES  Procedure(s) performed: Ace wrap and wooden shoe were applied       ____________________________________________   INITIAL IMPRESSION / ASSESSMENT AND PLAN / ED COURSE  Pertinent labs & imaging results that were available during my care of the patient were reviewed by me and considered in my medical decision making (see chart for details).  Patient is a 36 year old female complaining of left foot pain, x-ray of the left foot is negative, Ace wrap and wooden shoe were applied, prescription for meloxicam 15 mg daily was given, she is to follow-up with orthopedics if she is not better in 5-7 days, explained to her that a stress fracture would not show on an early x-ray, it may take weeks to show, patient states she understands, she is to apply ice to the foot, she is to return if she is worsening, she is discharged in stable condition      ____________________________________________   FINAL CLINICAL IMPRESSION(S) / ED DIAGNOSES  Final diagnoses:  Foot pain, left  Plantar fasciitis of right foot      NEW MEDICATIONS STARTED DURING THIS VISIT:  This SmartLink is deprecated. Use AVSMEDLIST instead to display the medication list for a patient.   Note:  This document was prepared using Dragon voice recognition  software and may include unintentional dictation errors.    Faythe GheeFisher,  W, PA-C 09/15/17 1540    Emily FilbertWilliams, Jonathan E, MD 09/15/17 [phone]

## 2017-11-17 ENCOUNTER — Ambulatory Visit
Admission: EM | Admit: 2017-11-17 | Discharge: 2017-11-17 | Disposition: A | Discharge status: Home / Self Care | Payer: Self-pay | Attending: Family Medicine | Admitting: Family Medicine

## 2017-11-17 ENCOUNTER — Other Ambulatory Visit: Payer: Self-pay

## 2017-11-17 DIAGNOSIS — R110 Nausea: Secondary | ICD-10-CM

## 2017-11-17 DIAGNOSIS — R197 Diarrhea, unspecified: Secondary | ICD-10-CM

## 2017-11-17 DIAGNOSIS — J069 Acute upper respiratory infection, unspecified: Secondary | ICD-10-CM

## 2017-11-17 DIAGNOSIS — B9789 Other viral agents as the cause of diseases classified elsewhere: Secondary | ICD-10-CM

## 2017-11-17 MED ORDER — ONDANSETRON 8 MG PO TBDP: 8.0000 mg | ORAL_TABLET | Freq: Three times a day (TID) | ORAL | 0 refills | Status: DC | PRN

## 2017-11-17 NOTE — ED Provider Notes (Signed)
[facility]    CSN: [id number] Arrival date & time: 11/17/17  1314     History   Chief Complaint No chief complaint on file.   HPI Sharon Gibson is a 37 y.o. female.   The history is provided by the patient.  URI  Presenting symptoms: congestion, fatigue, rhinorrhea and sore throat   Severity:  Moderate Onset quality:  Sudden Duration:  2 days Timing:  Constant Progression:  Worsening Chronicity:  New Relieved by:  None tried Ineffective treatments:  None tried Associated symptoms: myalgias   Associated symptoms: no sinus pain and no wheezing   Associated symptoms comment:  Nausea and watery diarrhea Risk factors: sick contacts   Risk factors: not elderly, no chronic cardiac disease, no chronic kidney disease, no chronic respiratory disease, no diabetes mellitus, no immunosuppression, no recent illness and no recent travel     Past Medical History:  Diagnosis Date  . Anxiety   . Chicken pox   . Depression   . Eating disorder    Has had residential treatment and hospitalization previously.  Marland Kitchen. History of anemia   . History of pulmonary embolus (PE)   . Hypothyroidism   . PTSD (post-traumatic stress disorder)     Patient Active Problem List   Diagnosis Date Noted  . Rash 10/16/2016  . Generalized anxiety disorder 10/12/2016  . Right knee pain 09/02/2016  . Severe recurrent major depression without psychotic features (HCC) 07/23/2016  . Bulimia nervosa, in partial remission, mild 07/23/2016    Class: Chronic  . Iron deficiency anemia 04/08/2016  . B12 deficiency 04/08/2016  . S/P gastric bypass 03/20/2016  . Eating disorder 03/20/2016  . Insomnia 03/20/2016  . PTSD (post-traumatic stress disorder) 03/20/2016  . History of hypothyroidism 03/20/2016    Past Surgical History:  Procedure Laterality Date  . CHOLECYSTECTOMY  2004  . GASTRIC BYPASS      OB History    No data available       Home Medications    Prior to Admission  medications   Medication Sig Start Date End Date Taking? Authorizing Provider  levonorgestrel (MIRENA) 20 MCG/24HR IUD 1 each by Intrauterine route once.   Yes [provider]  clonazePAM (KLONOPIN) 1 MG tablet Take 1 tablet (1 mg total) by mouth at bedtime. 01/25/17 01/25/18  Neysa HotterHisada, Reina, MD  gabapentin (NEURONTIN) 100 MG capsule Take 2 capsules (200 mg total) by mouth at bedtime. 06/09/17   Neysa HotterHisada, Reina, MD  meloxicam (MOBIC) 15 MG tablet Take 1 tablet (15 mg total) by mouth daily. 09/15/17 09/15/18  Sherrie MustacheFisher, Roselyn BeringSusan W, PA-C  ondansetron (ZOFRAN ODT) 8 MG disintegrating tablet Take 1 tablet (8 mg total) by mouth every 8 (eight) hours as needed. 11/17/17   Payton Mccallumonty, Tajon Moring, MD  sertraline (ZOLOFT) 100 MG tablet Take 3 tablets (300 mg total) by mouth daily. 06/09/17 06/09/18  Neysa HotterHisada, Reina, MD    Family History Family History  Problem Relation Age of Onset  . Hypertension Mother   . Hypertension Father   . Mental retardation Father   . Diabetes Father   . Anxiety disorder Father   . Prostate cancer Paternal Grandfather     Social History Social History   Tobacco Use  . Smoking status: Never Smoker  . Smokeless tobacco: Never Used  Substance Use Topics  . Alcohol use: Yes    Alcohol/week: 0.0 - 0.6 oz    Comment: rare  . Drug use: No     Allergies   Penicillins; Azithromycin;  and Other   Review of Systems Review of Systems  Constitutional: Positive for fatigue.  HENT: Positive for congestion, rhinorrhea and sore throat. Negative for sinus pain.   Respiratory: Negative for wheezing.   Musculoskeletal: Positive for myalgias.     Physical Exam Triage Vital Signs ED Triage Vitals  Enc Vitals Group     BP 11/17/17 1353 (!) 111/58     Pulse Rate 11/17/17 1353 65     Resp 11/17/17 1353 20     Temp 11/17/17 1353 98.5 F (36.9 C)     Temp Source 11/17/17 1353 Oral     SpO2 11/17/17 1353 100 %     Weight 11/17/17 1356 250 lb (113.4 kg)     Height 11/17/17 1356 5\' 3"   (1.6 m)     Head Circumference --      Peak Flow --      Pain Score 11/17/17 1356 2     Pain Loc --      Pain Edu? --      Excl. in GC? --    No data found.  Updated Vital Signs BP (!) 111/58 (BP Location: Right Arm)   Pulse 65   Temp 98.5 F (36.9 C) (Oral)   Resp 20   Ht 5\' 3"  (1.6 m)   Wt 250 lb (113.4 kg)   LMP 11/15/2017   SpO2 100%   BMI 44.29 kg/m   Visual Acuity Right Eye Distance:   Left Eye Distance:   Bilateral Distance:    Right Eye Near:   Left Eye Near:    Bilateral Near:     Physical Exam  Constitutional: She appears well-developed and well-nourished. No distress.  HENT:  Head: Normocephalic and atraumatic.  Right Ear: Tympanic membrane, external ear and ear canal normal.  Left Ear: Tympanic membrane, external ear and ear canal normal.  Nose: Rhinorrhea present. No mucosal edema, nose lacerations, sinus tenderness, nasal deformity, septal deviation or nasal septal hematoma. No epistaxis.  No foreign bodies. Right sinus exhibits no maxillary sinus tenderness and no frontal sinus tenderness. Left sinus exhibits no maxillary sinus tenderness and no frontal sinus tenderness.  Mouth/Throat: Uvula is midline, oropharynx is clear and moist and mucous membranes are normal. No oropharyngeal exudate.  Eyes: Conjunctivae are normal. Right eye exhibits no discharge. Left eye exhibits no discharge. No scleral icterus.  Neck: Normal range of motion. Neck supple. No thyromegaly present.  Cardiovascular: Normal rate, regular rhythm and normal heart sounds.  Pulmonary/Chest: Effort normal and breath sounds normal. No respiratory distress. She has no wheezes. She has no rales.  Abdominal: Soft. Bowel sounds are normal. She exhibits no distension and no mass. There is tenderness (mild, diffuse). There is no rebound and no guarding. No hernia.  Lymphadenopathy:    She has no cervical adenopathy.  Skin: She is not diaphoretic.  Nursing note and vitals reviewed.    UC  Treatments / Results  Labs (all labs ordered are listed, but only abnormal results are displayed) Labs Reviewed - No data to display  EKG  EKG Interpretation None       Radiology No results found.  Procedures Procedures (including critical care time)  Medications Ordered in UC Medications - No data to display   Initial Impression / Assessment and Plan / UC Course  I have reviewed the triage vital signs and the nursing notes.  Pertinent labs & imaging results that were available during my care of the patient were reviewed by me and considered  in my medical decision making (see chart for details).       Final Clinical Impressions(s) / UC Diagnoses   Final diagnoses:  Viral URI with cough  Diarrhea, unspecified type  Nausea without vomiting    ED Discharge Orders        Ordered    ondansetron (ZOFRAN ODT) 8 MG disintegrating tablet  Every 8 hours PRN     11/17/17 1423     1. diagnosis reviewed with patient 2. rx as per orders above; reviewed possible side effects, interactions, risks and benefits  3. Recommend supportive treatment with rest, fluids, otc analgesics and Imodium prn 4. Follow-up prn if symptoms worsen or don't improve  Controlled Substance Prescriptions [place] Controlled Substance Registry consulted? Not Applicable   Payton Mccallum, MD 11/17/17 [phone]

## 2017-11-17 NOTE — ED Triage Notes (Signed)
Pt with sx starting on Monday with diarrhea and cramping. Was on menses so was unsure if cramping was related to that. Yesterday started with muscle aches, throat is scratchy but not really sore. Endorses a non-productive cough.

## 2017-12-07 ENCOUNTER — Ambulatory Visit
Admission: EM | Admit: 2017-12-07 | Discharge: 2017-12-07 | Disposition: A | Discharge status: Home / Self Care | Payer: Self-pay | Attending: Family Medicine | Admitting: Family Medicine

## 2017-12-07 ENCOUNTER — Other Ambulatory Visit: Payer: Self-pay

## 2017-12-07 DIAGNOSIS — R21 Rash and other nonspecific skin eruption: Secondary | ICD-10-CM

## 2017-12-07 MED ORDER — PREDNISONE 10 MG (21) PO TBPK: ORAL_TABLET | ORAL | 0 refills | Status: DC

## 2017-12-07 NOTE — ED Provider Notes (Signed)
[facility]   CSN: [id number] Arrival date & time: 12/07/17  1150  History   Chief Complaint Chief Complaint  Patient presents with  . Rash   HPI  37 year old female presents with rash.  Patient reports that she has had a rash for the past week.  Started on the arms and now has spread to multiple other areas (back of the neck, under the breasts, behind the knees).  States it is very itchy.  No new exposures.  No changes in detergents or other things that she knows of.  She states that she is a creature of habit.  No known exacerbating or relieving factors.  No oral involvement.  No other associated symptoms.  No other complaints.  Past Medical History:  Diagnosis Date  . Anxiety   . Chicken pox   . Depression   . Eating disorder    Has had residential treatment and hospitalization previously.  Marland Kitchen. History of anemia   . History of pulmonary embolus (PE)   . Hypothyroidism   . PTSD (post-traumatic stress disorder)    Patient Active Problem List   Diagnosis Date Noted  . Rash 10/16/2016  . Generalized anxiety disorder 10/12/2016  . Right knee pain 09/02/2016  . Severe recurrent major depression without psychotic features (HCC) 07/23/2016  . Bulimia nervosa, in partial remission, mild 07/23/2016    Class: Chronic  . Iron deficiency anemia 04/08/2016  . B12 deficiency 04/08/2016  . S/P gastric bypass 03/20/2016  . Eating disorder 03/20/2016  . Insomnia 03/20/2016  . PTSD (post-traumatic stress disorder) 03/20/2016  . History of hypothyroidism 03/20/2016   Past Surgical History:  Procedure Laterality Date  . CHOLECYSTECTOMY  2004  . GASTRIC BYPASS     OB History    No data available     Home Medications    Prior to Admission medications   Medication Sig Start Date End Date Taking? Authorizing Provider  clonazePAM (KLONOPIN) 1 MG tablet Take 1 tablet (1 mg total) by mouth at bedtime. 01/25/17 01/25/18 Yes Hisada, Barbee Cougheina, MD  gabapentin (NEURONTIN) 100 MG  capsule Take 2 capsules (200 mg total) by mouth at bedtime. 06/09/17  Yes Neysa HotterHisada, Reina, MD  levonorgestrel (MIRENA) 20 MCG/24HR IUD 1 each by Intrauterine route once.   Yes [provider]  Multiple Vitamin (MULTIVITAMIN) tablet Take 1 tablet by mouth daily.   Yes [provider]  sertraline (ZOLOFT) 100 MG tablet Take 3 tablets (300 mg total) by mouth daily. 06/09/17 06/09/18 Yes Hisada, Barbee Cougheina, MD  predniSONE (STERAPRED UNI-PAK 21 TAB) 10 MG (21) TBPK tablet 6 tablets on day 1, then decrease by 1 tablet daily until gone. 12/07/17   Tommie Samsook, Anyra Kaufman G, DO   Family History Family History  Problem Relation Age of Onset  . Hypertension Mother   . Hypertension Father   . Mental retardation Father   . Diabetes Father   . Anxiety disorder Father   . Prostate cancer Paternal Grandfather    Social History Social History   Tobacco Use  . Smoking status: Never Smoker  . Smokeless tobacco: Never Used  Substance Use Topics  . Alcohol use: Yes    Alcohol/week: 0.0 - 0.6 oz    Comment: rare  . Drug use: No     Allergies   Penicillins; Azithromycin; and Other   Review of Systems Review of Systems  Constitutional: Negative.   Skin: Positive for rash.   Physical Exam Triage Vital Signs ED Triage Vitals  Enc Vitals  Group     BP 12/07/17 1216 (!) 121/48     Pulse Rate 12/07/17 1216 86     Resp 12/07/17 1216 17     Temp 12/07/17 1216 98.8 F (37.1 C)     Temp Source 12/07/17 1216 Oral     SpO2 12/07/17 1216 99 %     Weight 12/07/17 1214 250 lb (113.4 kg)     Height 12/07/17 1214 5\' 3"  (1.6 m)     Head Circumference --      Peak Flow --      Pain Score 12/07/17 1214 0     Pain Loc --      Pain Edu? --      Excl. in GC? --    Updated Vital Signs BP (!) 121/48 (BP Location: Left Arm)   Pulse 86   Temp 98.8 F (37.1 C) (Oral)   Resp 17   Ht 5\' 3"  (1.6 m)   Wt 250 lb (113.4 kg)   LMP 11/15/2017   SpO2 99%   BMI 44.29 kg/m   Physical Exam  Constitutional: She  is oriented to person, place, and time. She appears well-developed. No distress.  HENT:  Head: Normocephalic and atraumatic.  Eyes: Conjunctivae are normal. Right eye exhibits no discharge. Left eye exhibits no discharge.  Pulmonary/Chest: Effort normal. No respiratory distress.  Neurological: She is alert and oriented to person, place, and time.  Skin:  Scattered areas of erythematous, papular rash.  Psychiatric: She has a normal mood and affect. Her behavior is normal.  Nursing note and vitals reviewed.  UC Treatments / Results  Labs (all labs ordered are listed, but only abnormal results are displayed) Labs Reviewed - No data to display  EKG  EKG Interpretation None       Radiology No results found.  Procedures Procedures (including critical care time)  Medications Ordered in UC Medications - No data to display   Initial Impression / Assessment and Plan / UC Course  I have reviewed the triage vital signs and the nursing notes.  Pertinent labs & imaging results that were available during my care of the patient were reviewed by me and considered in my medical decision making (see chart for details).     37 year old female presents with a rash of unclear etiology.  Treating with empiric course of prednisone.  If fails to improve or worsens, should see dermatology.  Final Clinical Impressions(s) / UC Diagnoses   Final diagnoses:  Rash    ED Discharge Orders        Ordered    predniSONE (STERAPRED UNI-PAK 21 TAB) 10 MG (21) TBPK tablet     12/07/17 1303     Controlled Substance Prescriptions [place] Controlled Substance Registry consulted? Not Applicable   Tommie Sams, DO 12/07/17 1320

## 2017-12-07 NOTE — ED Triage Notes (Signed)
Patient complains of rash that originally started on her arms. Patient states that rash has since spread to back of neck, under breasts, behind knees. Patient states that rash is very itchy and has been occurring for 1 week.

## 2017-12-07 NOTE — Discharge Instructions (Signed)
Prednisone as prescribed.  If no improvement, see derm.  Take care  Dr. Adriana Simasook

## 2018-02-01 ENCOUNTER — Other Ambulatory Visit: Payer: Self-pay

## 2018-02-01 ENCOUNTER — Emergency Department: Payer: Self-pay

## 2018-02-01 ENCOUNTER — Encounter: Payer: Self-pay | Admitting: Emergency Medicine

## 2018-02-01 ENCOUNTER — Emergency Department
Admission: EM | Admit: 2018-02-01 | Discharge: 2018-02-02 | Disposition: A | Discharge status: Home / Self Care | Payer: Self-pay | Attending: Emergency Medicine | Admitting: Emergency Medicine

## 2018-02-01 DIAGNOSIS — N730 Acute parametritis and pelvic cellulitis: Secondary | ICD-10-CM

## 2018-02-01 DIAGNOSIS — N390 Urinary tract infection, site not specified: Secondary | ICD-10-CM

## 2018-02-01 DIAGNOSIS — R102 Pelvic and perineal pain: Secondary | ICD-10-CM

## 2018-02-01 DIAGNOSIS — Z79899 Other long term (current) drug therapy: Secondary | ICD-10-CM | POA: Insufficient documentation

## 2018-02-01 DIAGNOSIS — E282 Polycystic ovarian syndrome: Secondary | ICD-10-CM

## 2018-02-01 DIAGNOSIS — N739 Female pelvic inflammatory disease, unspecified: Secondary | ICD-10-CM | POA: Insufficient documentation

## 2018-02-01 DIAGNOSIS — B9689 Other specified bacterial agents as the cause of diseases classified elsewhere: Secondary | ICD-10-CM

## 2018-02-01 DIAGNOSIS — N760 Acute vaginitis: Secondary | ICD-10-CM | POA: Insufficient documentation

## 2018-02-01 DIAGNOSIS — E039 Hypothyroidism, unspecified: Secondary | ICD-10-CM | POA: Insufficient documentation

## 2018-02-01 LAB — POCT PREGNANCY, URINE: PREG TEST UR: NEGATIVE

## 2018-02-01 LAB — COMPREHENSIVE METABOLIC PANEL
ALK PHOS: 55 U/L (ref 38–126)
ALT: 13 U/L — AB (ref 14–54)
AST: 16 U/L (ref 15–41)
Albumin: 4.1 g/dL (ref 3.5–5.0)
Anion gap: 8 (ref 5–15)
BUN: 19 mg/dL (ref 6–20)
CALCIUM: 9.1 mg/dL (ref 8.9–10.3)
CHLORIDE: 106 mmol/L (ref 101–111)
CO2: 25 mmol/L (ref 22–32)
CREATININE: 0.61 mg/dL (ref 0.44–1.00)
GFR calc non Af Amer: >60 mL/min (ref >60)
Glucose, Bld: 100 mg/dL — ABNORMAL HIGH (ref 65–99)
Potassium: 4.1 mmol/L (ref 3.5–5.1)
Sodium: 139 mmol/L (ref 135–145)
Total Bilirubin: 0.4 mg/dL (ref 0.3–1.2)
Total Protein: 6.9 g/dL (ref 6.5–8.1)

## 2018-02-01 LAB — WET PREP, GENITAL
SPERM: NONE SEEN
Trich, Wet Prep: NONE SEEN
Yeast Wet Prep HPF POC: NONE SEEN

## 2018-02-01 LAB — CHLAMYDIA/NGC RT PCR (ARMC ONLY)
CHLAMYDIA TR: NOT DETECTED
N gonorrhoeae: NOT DETECTED

## 2018-02-01 LAB — URINALYSIS, COMPLETE (UACMP) WITH MICROSCOPIC
Bilirubin Urine: NEGATIVE
GLUCOSE, UA: NEGATIVE mg/dL
Ketones, ur: NEGATIVE mg/dL
Nitrite: NEGATIVE
Protein, ur: NEGATIVE mg/dL
Specific Gravity, Urine: 1.006 (ref 1.005–1.030)
WBC, UA: >50 WBC/hpf — ABNORMAL HIGH (ref 0–5)
pH: 5 (ref 5.0–8.0)

## 2018-02-01 LAB — CBC
HCT: 37.7 % (ref 35.0–47.0)
Hemoglobin: 13.1 g/dL (ref 12.0–16.0)
MCH: 29.7 pg (ref 26.0–34.0)
MCHC: 34.7 g/dL (ref 32.0–36.0)
MCV: 85.7 fL (ref 80.0–100.0)
PLATELETS: 240 10*3/uL (ref 150–440)
RBC: 4.4 MIL/uL (ref 3.80–5.20)
RDW: 14.5 % (ref 11.5–14.5)
WBC: 8.4 10*3/uL (ref 3.6–11.0)

## 2018-02-01 LAB — LIPASE, BLOOD: LIPASE: 28 U/L (ref 11–51)

## 2018-02-01 MED ORDER — DOXYCYCLINE HYCLATE 100 MG PO CAPS: 100.0000 mg | ORAL_CAPSULE | Freq: Two times a day (BID) | ORAL | 0 refills | Status: DC

## 2018-02-01 MED ORDER — METRONIDAZOLE 500 MG PO TABS
500.0000 mg | ORAL_TABLET | Freq: Once | ORAL | Status: AC
  Administered 2018-02-01: 500 mg via ORAL
  Filled 2018-02-01: qty 1

## 2018-02-01 MED ORDER — CEFTRIAXONE SODIUM 250 MG IJ SOLR
250.0000 mg | Freq: Once | INTRAMUSCULAR | Status: AC
  Administered 2018-02-01: 250 mg via INTRAMUSCULAR
  Filled 2018-02-01: qty 250

## 2018-02-01 MED ORDER — CEPHALEXIN 500 MG PO CAPS: 500.0000 mg | ORAL_CAPSULE | Freq: Three times a day (TID) | ORAL | 0 refills | Status: DC

## 2018-02-01 MED ORDER — METRONIDAZOLE 500 MG PO TABS: 500.0000 mg | ORAL_TABLET | Freq: Two times a day (BID) | ORAL | 0 refills | Status: DC

## 2018-02-01 MED ORDER — DOXYCYCLINE HYCLATE 100 MG PO TABS
100.0000 mg | ORAL_TABLET | Freq: Once | ORAL | Status: AC
  Administered 2018-02-01: 100 mg via ORAL
  Filled 2018-02-01: qty 1

## 2018-02-01 NOTE — ED Notes (Signed)
Pt reports that she has hx of PCOS, pt reports that she is having pain in her uterus.  Pt reports that she has had IUD x2 years.  Pt states the pain is similar to when she had the IUD changed out 2 years ago.  Pt also states she is not having pain during urination, but some directly after urination.  Pt is A&Ox4, in NAD.

## 2018-02-01 NOTE — ED Provider Notes (Signed)
[facility] Emergency Department Provider Note  ___________________________________________   First MD Initiated Contact with Patient 02/01/18 2006     (approximate)  I have reviewed the triage vital signs and the nursing notes.   HISTORY  Chief Complaint Abdominal Pain and Vaginal Bleeding   HPI Sharon Gibson is a 37 y.o. female with a history of IUD 2 years ago as well as PCOS was presenting to the emergency department today with vaginal discharge as well as pelvic pain over the past 2 weeks.  She says the pain is a cramping pain that is started radiated through to her back.  It is to the lower abdomen/pelvic region.  Denies any nausea, or vomiting.  However, does say that she has had intermittent diarrhea over the past several weeks.  Does not report any blood in her stool.  Denies any burning with urination.  Is not overtly suspicious for STD but says that it is possible that she has an STD.  Says that the pain has been ongoing for about 2 weeks now and is worsening.  Says that she tried ibuprofen several nights ago without relief.  Also with history of a gastric bypass surgery.  Says that she has had similar symptoms previously as well with PCOS.  Past Medical History:  Diagnosis Date  . Anxiety   . Chicken pox   . Depression   . Eating disorder    Has had residential treatment and hospitalization previously.  Marland Kitchen History of anemia   . History of pulmonary embolus (PE)   . Hypothyroidism   . PTSD (post-traumatic stress disorder)     Patient Active Problem List   Diagnosis Date Noted  . Rash 10/16/2016  . Generalized anxiety disorder 10/12/2016  . Right knee pain 09/02/2016  . Severe recurrent major depression without psychotic features (HCC) 07/23/2016  . Bulimia nervosa, in partial remission, mild 07/23/2016    Class: Chronic  . Iron deficiency anemia 04/08/2016  . B12 deficiency 04/08/2016  . S/P gastric bypass 03/20/2016  . Eating  disorder 03/20/2016  . Insomnia 03/20/2016  . PTSD (post-traumatic stress disorder) 03/20/2016  . History of hypothyroidism 03/20/2016    Past Surgical History:  Procedure Laterality Date  . CHOLECYSTECTOMY  2004  . GASTRIC BYPASS      Prior to Admission medications   Medication Sig Start Date End Date Taking? Authorizing Provider  clonazePAM (KLONOPIN) 1 MG tablet Take 1 tablet (1 mg total) by mouth at bedtime. 01/25/17 01/25/18  Neysa Hotter, MD  gabapentin (NEURONTIN) 100 MG capsule Take 2 capsules (200 mg total) by mouth at bedtime. 06/09/17   Neysa Hotter, MD  levonorgestrel (MIRENA) 20 MCG/24HR IUD 1 each by Intrauterine route once.    [provider]  Multiple Vitamin (MULTIVITAMIN) tablet Take 1 tablet by mouth daily.    [provider]  predniSONE (STERAPRED UNI-PAK 21 TAB) 10 MG (21) TBPK tablet 6 tablets on day 1, then decrease by 1 tablet daily until gone. 12/07/17   Tommie Sams, DO  sertraline (ZOLOFT) 100 MG tablet Take 3 tablets (300 mg total) by mouth daily. 06/09/17 06/09/18  Neysa Hotter, MD    Allergies Penicillins; Azithromycin; and Other  Family History  Problem Relation Age of Onset  . Hypertension Mother   . Hypertension Father   . Mental retardation Father   . Diabetes Father   . Anxiety disorder Father   . Prostate cancer Paternal Grandfather     Social History Social History  Tobacco Use  . Smoking status: Never Smoker  . Smokeless tobacco: Never Used  Substance Use Topics  . Alcohol use: Yes    Alcohol/week: 0.0 - 0.6 oz    Comment: rare  . Drug use: No    Review of Systems  Constitutional: No fever/chills Eyes: No visual changes. ENT: No sore throat. Cardiovascular: Denies chest pain. Respiratory: Denies shortness of breath. Gastrointestinal: No nausea, no vomiting. No constipation. Genitourinary: Negative for dysuria. Musculoskeletal: Negative for back pain. Skin: Negative for rash. Neurological: Negative for  headaches, focal weakness or numbness.   ____________________________________________   PHYSICAL EXAM:  VITAL SIGNS: ED Triage Vitals  Enc Vitals Group     BP 02/01/18 1816 (!) 121/48     Pulse Rate 02/01/18 1816 72     Resp 02/01/18 1816 18     Temp 02/01/18 1816 98.6 F (37 C)     Temp Source 02/01/18 1816 Oral     SpO2 02/01/18 1816 100 %     Weight 02/01/18 1818 250 lb (113.4 kg)     Height 02/01/18 1818  (1.6 m)     Head Circumference --      Peak Flow --      Pain Score 02/01/18 2007 7     Pain Loc --      Pain Edu? --      Excl. in GC? --     Constitutional: Alert and oriented. Well appearing and in no acute distress. Eyes: Conjunctivae are normal.  Head: Atraumatic. Nose: No congestion/rhinnorhea. Mouth/Throat: Mucous membranes are moist.  Neck: No stridor.   Cardiovascular: Normal rate, regular rhythm. Grossly normal heart sounds.   Respiratory: Normal respiratory effort.  No retractions. Lungs CTAB. Gastrointestinal: Soft and nontender. No distention. No CVA tenderness. Genitourinary: Normal external exam without any lesions.  Speculum exam with a mild to moderate amount of yellow discharge with a small amount of blood mixed in.  Able to visualize the IUD strings from the cervical loss.  Bimanual exam with CMT as well as left adnexal tenderness but without uterine tenderness or right adnexal tenderness.  No masses palpated. Musculoskeletal: No lower extremity tenderness nor edema.  No joint effusions. Neurologic:  Normal speech and language. No gross focal neurologic deficits are appreciated. Skin:  Skin is warm, dry and intact. No rash noted. Psychiatric: Mood and affect are normal. Speech and behavior are normal.  ____________________________________________   LABS (all labs ordered are listed, but only abnormal results are displayed)  Labs Reviewed  WET PREP, GENITAL - Abnormal; Notable for the following components:      Result Value   Clue Cells  Wet Prep HPF POC PRESENT (*)    WBC, Wet Prep HPF POC MANY (*)    All other components within normal limits  COMPREHENSIVE METABOLIC PANEL - Abnormal; Notable for the following components:   Glucose, Bld 100 (*)    ALT 13 (*)    All other components within normal limits  URINALYSIS, COMPLETE (UACMP) WITH MICROSCOPIC - Abnormal; Notable for the following components:   Color, Urine STRAW (*)    APPearance HAZY (*)    Hgb urine dipstick SMALL (*)    Leukocytes, UA LARGE (*)    WBC, UA >50 (*)    Bacteria, UA RARE (*)    All other components within normal limits  CHLAMYDIA/NGC RT PCR ([facility] ONLY)  LIPASE, BLOOD  CBC  POC URINE PREG, ED  POCT PREGNANCY, URINE   ____________________________________________  EKG  ____________________________________________  RADIOLOGY  Pending pelvic ultrasound at this time. ____________________________________________   PROCEDURES  Procedure(s) performed:   Procedures  Critical Care performed:   ____________________________________________   INITIAL IMPRESSION / ASSESSMENT AND PLAN / ED COURSE  Pertinent labs & imaging results that were available during my care of the patient were reviewed by me and considered in my medical decision making (see chart for details).  Differential diagnosis includes, but is not limited to, ovarian cyst, ovarian torsion, acute appendicitis, diverticulitis, urinary tract infection/pyelonephritis, endometriosis, bowel obstruction, colitis, renal colic, gastroenteritis, hernia, fibroids, endometriosis, pregnancy related pain including ectopic pregnancy, etc. As part of my medical decision making, I reviewed the following data within the electronic MEDICAL RECORD NUMBER Notes from prior ED visits  ----------------------------------------- 11:07 PM on 02/01/2018 -----------------------------------------  Patient at this time without any distress.  Work-up reveals urine that appears positive for UTI as  well as many white blood cells as well as clue cells.  Patient with normal white blood cell count.  CMT only mild tenderness to palpation in the left adnexa.  Patient nontoxic-appearing.  I will leave an IUD but I will treat the patient for PID as well as UTI.  Also to be treated for bacterial vaginosis.  I discussed the diagnosis with the patient.  York Spaniel that she only has one sexual partner.  We discussed the PID is normally caused by an STD and that the patient should not engage in any sexual activity with her partner and that the partner should know that she is being treated so that the partner may also be tested and treated as well.  Patient knows that she was not have any sexual contact her that her partner should not have any sexual contact and so they are both tested and treated and cleared for other STDs including HIV, syphilis and herpes.  Patient still pending results of pelvic ultrasound at this time.  Signed out to Dr. Lamont Snowball. ____________________________________________   FINAL CLINICAL IMPRESSION(S) / ED DIAGNOSES  Final diagnoses:  Pelvic pain  Pelvic pain  PID.  Bacterial vaginosis.  UTI.    NEW MEDICATIONS STARTED DURING THIS VISIT:  New Prescriptions   No medications on file     Note:  This document was prepared using Dragon voice recognition software and may include unintentional dictation errors.     Myrna Blazer, MD 02/01/18 [phone]

## 2018-02-01 NOTE — ED Triage Notes (Signed)
Pt presents to ED c/o bil lower abd pain, vaginal discharge, and continuous light bleeding continuously 2 wks. Pt has hx PCOS and states pain feels similar to previous ovarian-related pain.

## 2018-02-01 NOTE — Discharge Instructions (Addendum)
It was a pleasure to take care of you today, and thank you for coming to our emergency department.  If you have any questions or concerns before leaving please ask the nurse to grab me and I'm more than happy to go through your aftercare instructions again.  If you were prescribed any opioid pain medication today such as Norco, Vicodin, Percocet, morphine, hydrocodone, or oxycodone please make sure you do not drive when you are taking this medication as it can alter your ability to drive safely.  If you have any concerns once you are home that you are not improving or are in fact getting worse before you can make it to your follow-up appointment, please do not hesitate to call 911 and come back for further evaluation.  Merrily Brittle, MD  Results for orders placed or performed during the hospital encounter of 02/01/18  Wet prep, genital  Result Value Ref Range   Yeast Wet Prep HPF POC NONE SEEN NONE SEEN   Trich, Wet Prep NONE SEEN NONE SEEN   Clue Cells Wet Prep HPF POC PRESENT (A) NONE SEEN   WBC, Wet Prep HPF POC MANY (A) NONE SEEN   Sperm NONE SEEN   Chlamydia/NGC rt PCR ([facility] only)  Result Value Ref Range   Specimen source GC/Chlam CHLAMYDIA SPECIES    Chlamydia Tr NOT DETECTED NOT DETECTED   N gonorrhoeae NOT DETECTED NOT DETECTED  Lipase, blood  Result Value Ref Range   Lipase 28 11 - 51 U/L  Comprehensive metabolic panel  Result Value Ref Range   Sodium 139 135 - 145 mmol/L   Potassium 4.1 3.5 - 5.1 mmol/L   Chloride 106 101 - 111 mmol/L   CO2 25 22 - 32 mmol/L   Glucose, Bld 100 (H) 65 - 99 mg/dL   BUN 19 6 - 20 mg/dL   Creatinine, Ser 1.61 0.44 - 1.00 mg/dL   Calcium 9.1 8.9 - 09.6 mg/dL   Total Protein 6.9 6.5 - 8.1 g/dL   Albumin 4.1 3.5 - 5.0 g/dL   AST 16 15 - 41 U/L   ALT 13 (L) 14 - 54 U/L   Alkaline Phosphatase 55 38 - 126 U/L   Total Bilirubin 0.4 0.3 - 1.2 mg/dL   GFR calc non Af Amer >60 >60 mL/min   GFR calc Af Amer >60 >60 mL/min   Anion gap 8 5 - 15    CBC  Result Value Ref Range   WBC 8.4 3.6 - 11.0 K/uL   RBC 4.40 3.80 - 5.20 MIL/uL   Hemoglobin 13.1 12.0 - 16.0 g/dL   HCT 04.5 40.9 - 81.1 %   MCV 85.7 80.0 - 100.0 fL   MCH 29.7 26.0 - 34.0 pg   MCHC 34.7 32.0 - 36.0 g/dL   RDW 91.4 78.2 - 95.6 %   Platelets 240 150 - 440 K/uL  Urinalysis, Complete w Microscopic  Result Value Ref Range   Color, Urine STRAW (A) YELLOW   APPearance HAZY (A) CLEAR   Specific Gravity, Urine 1.006 1.005 - 1.030   pH 5.0 5.0 - 8.0   Glucose, UA NEGATIVE NEGATIVE mg/dL   Hgb urine dipstick SMALL (A) NEGATIVE   Bilirubin Urine NEGATIVE NEGATIVE   Ketones, ur NEGATIVE NEGATIVE mg/dL   Protein, ur NEGATIVE NEGATIVE mg/dL   Nitrite NEGATIVE NEGATIVE   Leukocytes, UA LARGE (A) NEGATIVE   RBC / HPF 0-5 0 - 5 RBC/hpf   WBC, UA >50 (H) 0 - 5 WBC/hpf  Bacteria, UA RARE (A) NONE SEEN   Squamous Epithelial / LPF 0-5 0 - 5  Pregnancy, urine POC  Result Value Ref Range   Preg Test, Ur NEGATIVE NEGATIVE   US Pelvis Transvanginal Non-ob (tv Only)  Result Date: 02/01/2018 CLINICAL DATA:  Initial evaluation for acute pelvic pain, vaginal discharge. EXAM: TRANSABDOMINAL AND TRANSVAGINAL ULTRASOUND OF PELVIS DOPPLER ULTRASOUND OF OVARIES TECHNIQUE: Both transabdominal and transvaginal ultrasound examinations of the pelvis were performed. Transabdominal technique was performed for global imaging of the pelvis including uterus, ovaries, adnexal regions, and pelvic cul-de-sac. It was necessary to proceed with endovaginal exam following the transabdominal exam to visualize the uterus, endometrium, and ovaries. Color and duplex Doppler ultrasound was utilized to evaluate blood flow to the ovaries. COMPARISON:  None. FINDINGS: Uterus Measurements: 7.6 x 4.4 x 5.4 cm. No fibroids or other mass visualized. Endometrium Thickness: 7.1 mm. No focal abnormality visualized. IUD in appropriate position within the endometrial cavity. Right ovary Measurements: 4.3 x 2.4 x 1.8 cm.  Right ovary poorly visualized via transvaginal technique due to posterior positioning, but grossly normal in appearance without mass lesion or other abnormality. Left ovary Measurements: 8.4 x 4.4 x 5.3 cm. 3.7 x 3.5 x 3.5 cm complex cyst with internal lace-like architecture, most consistent with a hemorrhagic cyst. Second hypoechoic lesion measuring 5.2 x 3.9 x 4.9 cm with fairly homogeneous low-level internal echoes. Finding could reflect a second hemorrhagic cyst or possibly endometrioma. Question of possible mural nodularity (image 113). Pulsed Doppler evaluation of the left ovary demonstrates normal low-resistance arterial and venous waveforms. Doppler waveforms of the right ovary was unable to be obtained due to ovarian position. Gross color-flow is thought to be maintained within the ovary. Other findings No abnormal free fluid. IMPRESSION: 1. 5.2 cm complex hypoechoic left ovarian lesion, indeterminate. Primary differential considerations include a possible hemorrhagic cyst, enlarged physiologic cyst with internal proteinaceous debris, or possibly endometrioma. Question of focal mural nodularity within this lesion, not entirely certain. A short interval follow-up ultrasound in 6-12 weeks is recommended for further evaluation. 2. Second 3.7 cm complex left ovarian cyst, most consistent with a benign hemorrhagic cyst. 3. No other acute abnormality within the pelvis. No sonographic evidence for torsion within the left ovary. Doppler evaluation of the right ovary limited due to positioning, however, the right ovary is grossly normal in appearance. 4. IUD in appropriate position within the endometrial canal. Electronically Signed   By: Rise Mu M.D.   On: 02/01/2018 23:43   US Pelvis Complete  Result Date: 02/01/2018 CLINICAL DATA:  Initial evaluation for acute pelvic pain, vaginal discharge. EXAM: TRANSABDOMINAL AND TRANSVAGINAL ULTRASOUND OF PELVIS DOPPLER ULTRASOUND OF OVARIES TECHNIQUE: Both  transabdominal and transvaginal ultrasound examinations of the pelvis were performed. Transabdominal technique was performed for global imaging of the pelvis including uterus, ovaries, adnexal regions, and pelvic cul-de-sac. It was necessary to proceed with endovaginal exam following the transabdominal exam to visualize the uterus, endometrium, and ovaries. Color and duplex Doppler ultrasound was utilized to evaluate blood flow to the ovaries. COMPARISON:  None. FINDINGS: Uterus Measurements: 7.6 x 4.4 x 5.4 cm. No fibroids or other mass visualized. Endometrium Thickness: 7.1 mm. No focal abnormality visualized. IUD in appropriate position within the endometrial cavity. Right ovary Measurements: 4.3 x 2.4 x 1.8 cm. Right ovary poorly visualized via transvaginal technique due to posterior positioning, but grossly normal in appearance without mass lesion or other abnormality. Left ovary Measurements: 8.4 x 4.4 x 5.3 cm. 3.7 x 3.5 x 3.5  cm complex cyst with internal lace-like architecture, most consistent with a hemorrhagic cyst. Second hypoechoic lesion measuring 5.2 x 3.9 x 4.9 cm with fairly homogeneous low-level internal echoes. Finding could reflect a second hemorrhagic cyst or possibly endometrioma. Question of possible mural nodularity (image 113). Pulsed Doppler evaluation of the left ovary demonstrates normal low-resistance arterial and venous waveforms. Doppler waveforms of the right ovary was unable to be obtained due to ovarian position. Gross color-flow is thought to be maintained within the ovary. Other findings No abnormal free fluid. IMPRESSION: 1. 5.2 cm complex hypoechoic left ovarian lesion, indeterminate. Primary differential considerations include a possible hemorrhagic cyst, enlarged physiologic cyst with internal proteinaceous debris, or possibly endometrioma. Question of focal mural nodularity within this lesion, not entirely certain. A short interval follow-up ultrasound in 6-12 weeks is  recommended for further evaluation. 2. Second 3.7 cm complex left ovarian cyst, most consistent with a benign hemorrhagic cyst. 3. No other acute abnormality within the pelvis. No sonographic evidence for torsion within the left ovary. Doppler evaluation of the right ovary limited due to positioning, however, the right ovary is grossly normal in appearance. 4. IUD in appropriate position within the endometrial canal. Electronically Signed   By: Rise Mu M.D.   On: 02/01/2018 23:43   US Pelvic Doppler (torsion R/o Or Mass Arterial Flow)  Result Date: 02/01/2018 CLINICAL DATA:  Initial evaluation for acute pelvic pain, vaginal discharge. EXAM: TRANSABDOMINAL AND TRANSVAGINAL ULTRASOUND OF PELVIS DOPPLER ULTRASOUND OF OVARIES TECHNIQUE: Both transabdominal and transvaginal ultrasound examinations of the pelvis were performed. Transabdominal technique was performed for global imaging of the pelvis including uterus, ovaries, adnexal regions, and pelvic cul-de-sac. It was necessary to proceed with endovaginal exam following the transabdominal exam to visualize the uterus, endometrium, and ovaries. Color and duplex Doppler ultrasound was utilized to evaluate blood flow to the ovaries. COMPARISON:  None. FINDINGS: Uterus Measurements: 7.6 x 4.4 x 5.4 cm. No fibroids or other mass visualized. Endometrium Thickness: 7.1 mm. No focal abnormality visualized. IUD in appropriate position within the endometrial cavity. Right ovary Measurements: 4.3 x 2.4 x 1.8 cm. Right ovary poorly visualized via transvaginal technique due to posterior positioning, but grossly normal in appearance without mass lesion or other abnormality. Left ovary Measurements: 8.4 x 4.4 x 5.3 cm. 3.7 x 3.5 x 3.5 cm complex cyst with internal lace-like architecture, most consistent with a hemorrhagic cyst. Second hypoechoic lesion measuring 5.2 x 3.9 x 4.9 cm with fairly homogeneous low-level internal echoes. Finding could reflect a second  hemorrhagic cyst or possibly endometrioma. Question of possible mural nodularity (image 113). Pulsed Doppler evaluation of the left ovary demonstrates normal low-resistance arterial and venous waveforms. Doppler waveforms of the right ovary was unable to be obtained due to ovarian position. Gross color-flow is thought to be maintained within the ovary. Other findings No abnormal free fluid. IMPRESSION: 1. 5.2 cm complex hypoechoic left ovarian lesion, indeterminate. Primary differential considerations include a possible hemorrhagic cyst, enlarged physiologic cyst with internal proteinaceous debris, or possibly endometrioma. Question of focal mural nodularity within this lesion, not entirely certain. A short interval follow-up ultrasound in 6-12 weeks is recommended for further evaluation. 2. Second 3.7 cm complex left ovarian cyst, most consistent with a benign hemorrhagic cyst. 3. No other acute abnormality within the pelvis. No sonographic evidence for torsion within the left ovary. Doppler evaluation of the right ovary limited due to positioning, however, the right ovary is grossly normal in appearance. 4. IUD in appropriate position within the endometrial  canal. Electronically Signed   By: Rise Mu M.D.   On: 02/01/2018 23:43

## 2018-02-02 MED ORDER — HYDROCODONE-ACETAMINOPHEN 5-325 MG PO TABS: 1.0000 | ORAL_TABLET | Freq: Four times a day (QID) | ORAL | 0 refills | Status: DC | PRN

## 2018-02-02 NOTE — ED Provider Notes (Signed)
Care signed over from Dr. Pershing Proud pending ultrasound.  The patient's ultrasound is complicated and shows a 5.2 cm complex cyst consistent with either hemorrhagic cyst or physiologic cyst with internal debris.  The patient's gonorrhea and Chlamydia are negative.  I had a lengthy discussion with the patient regarding the diagnostic uncertainty and the importance of follow-up with [facility] within 2 days.  She understands strict return precautions.  Do not suspect TOA at this time.   Merrily Brittle, MD 02/02/18 0003

## 2018-02-03 ENCOUNTER — Ambulatory Visit (INDEPENDENT_AMBULATORY_CARE_PROVIDER_SITE_OTHER): Payer: Self-pay | Admitting: Obstetrics and Gynecology

## 2018-02-03 ENCOUNTER — Encounter: Payer: Self-pay | Admitting: Obstetrics and Gynecology

## 2018-02-03 VITALS — BP 114/64 | HR 66 | Ht 63.0 in | Wt 244.0 lb

## 2018-02-03 DIAGNOSIS — R102 Pelvic and perineal pain: Secondary | ICD-10-CM

## 2018-02-03 DIAGNOSIS — N760 Acute vaginitis: Secondary | ICD-10-CM

## 2018-02-03 DIAGNOSIS — Z86711 Personal history of pulmonary embolism: Secondary | ICD-10-CM

## 2018-02-03 DIAGNOSIS — N83202 Unspecified ovarian cyst, left side: Secondary | ICD-10-CM

## 2018-02-03 NOTE — Progress Notes (Signed)
Patient ID: Sharon Gibson, female   DOB: [birth year], 36 y.o.   MRN: [id number]  Reason for Consult: Follow-up (ER f/u ovarian cyst)   Referred by No ref. provider found  Subjective:     HPI:  Sharon Gibson is a 37 y.o. female she presents today as follow up from a recent ER visit. She reports that her pelvic pain began 3 weeks ago and was mild. She then has 2 weeks of small vaginal bleeding that stopped. Her pain began again this weekend and was severe enough that she went to the ER. In the ER she was diagnosed with ovarian cysts, but also PID and given multiple antibiotics. She was surprised by the infection diagnosis. She has not had abnormal vaginal discharge. She is sexually active. Denies dyspareunia. She uses an IUD and condoms.  She has a remote history of ovarian cysts more than 10 years ago. She has never had surgery for ovarian cysts. She reports that she has always had irregular periods. She was on birth control by 7th grade. She had to stop birth control several years ago because of pulmonary embolisms. She is not currently on anticoagulants. She has been overall happy with the bleeding profile on the IUD. She does not want to have children. She feels very string about not having children and has always felt that way.   Past Medical History:  Diagnosis Date  . Anxiety   . Chicken pox   . Depression   . Eating disorder    Has had residential treatment and hospitalization previously.  Marland Kitchen History of anemia   . History of pulmonary embolus (PE)   . Hypothyroidism   . PTSD (post-traumatic stress disorder)    Family History  Problem Relation Age of Onset  . Hypertension Mother   . Hypertension Father   . Mental retardation Father   . Diabetes Father   . Anxiety disorder Father   . Prostate cancer Paternal Grandfather    Past Surgical History:  Procedure Laterality Date  . CHOLECYSTECTOMY  2004  . GASTRIC BYPASS      Short Social History:  Social History    Tobacco Use  . Smoking status: Never Smoker  . Smokeless tobacco: Never Used  Substance Use Topics  . Alcohol use: Yes    Alcohol/week: 0.0 - 0.6 oz    Comment: rare    Allergies  Allergen Reactions  . Penicillins Hives  . Azithromycin   . Other     Narcotics- Burning in the stomach (whether PO or IV)    Current Outpatient Medications  Medication Sig Dispense Refill  . cephALEXin (KEFLEX) 500 MG capsule Take 1 capsule (500 mg total) by mouth 3 (three) times daily for 10 days. 21 capsule 0  . doxycycline (VIBRAMYCIN) 100 MG capsule Take 1 capsule (100 mg total) by mouth 2 (two) times daily. 28 capsule 0  . levonorgestrel (MIRENA) 20 MCG/24HR IUD 1 each by Intrauterine route once.    . metroNIDAZOLE (FLAGYL) 500 MG tablet Take 1 tablet (500 mg total) by mouth 2 (two) times daily for 14 days. 14 tablet 0  . Multiple Vitamin (MULTIVITAMIN) tablet Take 1 tablet by mouth daily.    . sertraline (ZOLOFT) 100 MG tablet Take 3 tablets (300 mg total) by mouth daily. 90 tablet 0  . clonazePAM (KLONOPIN) 1 MG tablet Take 1 tablet (1 mg total) by mouth at bedtime. 90 tablet 0  . gabapentin (NEURONTIN) 100 MG capsule Take 2 capsules (200 mg total) by  mouth at bedtime. (Patient not taking: Reported on 02/03/2018) 60 capsule 0   No current facility-administered medications for this visit.     Review of Systems  Constitutional: Negative for chills, fatigue, fever and unexpected weight change.  HENT: Negative for trouble swallowing.  Eyes: Negative for loss of vision.  Respiratory: Negative for cough, shortness of breath and wheezing.  Cardiovascular: Negative for chest pain, leg swelling, palpitations and syncope.  GI: Negative for abdominal pain, blood in stool, diarrhea, nausea and vomiting.  GU: Negative for difficulty urinating, dysuria, frequency and hematuria.  Musculoskeletal: Negative for back pain, leg pain and joint pain.  Skin: Negative for rash.  Neurological: Negative for  dizziness, headaches, light-headedness, numbness and seizures.  Psychiatric: Negative for behavioral problem, confusion, depressed mood and sleep disturbance.        Objective:  Objective   Vitals:   02/03/18 1359  BP: 114/64  Pulse: 66  Weight: 244 lb (110.7 kg)  Height:  (1.6 m)   Body mass index is 43.22 kg/m.  Physical Exam  Constitutional: She is oriented to person, place, and time. She appears well-developed and well-nourished.  HENT:  Head: Normocephalic and atraumatic.  Eyes: EOM are normal.  Cardiovascular: Normal rate, regular rhythm and normal heart sounds.  Pulmonary/Chest: Effort normal and breath sounds normal.  Abdominal: Soft. She exhibits no distension and no mass. There is no tenderness. There is no rebound and no guarding.  Genitourinary: Uterus normal. There is no rash, tenderness, lesion or injury on the right labia. There is no rash, tenderness, lesion or injury on the left labia. Uterus is not enlarged, not fixed and not tender. Cervix exhibits no motion tenderness, no discharge and no friability. Right adnexum displays no mass, no tenderness and no fullness. Left adnexum displays no mass, no tenderness and no fullness. There is bleeding in the vagina.    Neurological: She is alert and oriented to person, place, and time.  Skin: Skin is warm and dry.  Psychiatric: She has a normal mood and affect. Her behavior is normal. Judgment and thought content normal.  Nursing note and vitals reviewed.   US Pelvis Transvanginal Non-ob (tv Only)  Result Date: 02/01/2018 CLINICAL DATA:  Initial evaluation for acute pelvic pain, vaginal discharge. EXAM: TRANSABDOMINAL AND TRANSVAGINAL ULTRASOUND OF PELVIS DOPPLER ULTRASOUND OF OVARIES TECHNIQUE: Both transabdominal and transvaginal ultrasound examinations of the pelvis were performed. Transabdominal technique was performed for global imaging of the pelvis including uterus, ovaries, adnexal regions, and pelvic  cul-de-sac. It was necessary to proceed with endovaginal exam following the transabdominal exam to visualize the uterus, endometrium, and ovaries. Color and duplex Doppler ultrasound was utilized to evaluate blood flow to the ovaries. COMPARISON:  None. FINDINGS: Uterus Measurements: 7.6 x 4.4 x 5.4 cm. No fibroids or other mass visualized. Endometrium Thickness: 7.1 mm. No focal abnormality visualized. IUD in appropriate position within the endometrial cavity. Right ovary Measurements: 4.3 x 2.4 x 1.8 cm. Right ovary poorly visualized via transvaginal technique due to posterior positioning, but grossly normal in appearance without mass lesion or other abnormality. Left ovary Measurements: 8.4 x 4.4 x 5.3 cm. 3.7 x 3.5 x 3.5 cm complex cyst with internal lace-like architecture, most consistent with a hemorrhagic cyst. Second hypoechoic lesion measuring 5.2 x 3.9 x 4.9 cm with fairly homogeneous low-level internal echoes. Finding could reflect a second hemorrhagic cyst or possibly endometrioma. Question of possible mural nodularity (image 113). Pulsed Doppler evaluation of the left ovary demonstrates normal low-resistance arterial and  venous waveforms. Doppler waveforms of the right ovary was unable to be obtained due to ovarian position. Gross color-flow is thought to be maintained within the ovary. Other findings No abnormal free fluid. IMPRESSION: 1. 5.2 cm complex hypoechoic left ovarian lesion, indeterminate. Primary differential considerations include a possible hemorrhagic cyst, enlarged physiologic cyst with internal proteinaceous debris, or possibly endometrioma. Question of focal mural nodularity within this lesion, not entirely certain. A short interval follow-up ultrasound in 6-12 weeks is recommended for further evaluation. 2. Second 3.7 cm complex left ovarian cyst, most consistent with a benign hemorrhagic cyst. 3. No other acute abnormality within the pelvis. No sonographic evidence for torsion within  the left ovary. Doppler evaluation of the right ovary limited due to positioning, however, the right ovary is grossly normal in appearance. 4. IUD in appropriate position within the endometrial canal. Electronically Signed   By: Rise Mu M.D.   On: 02/01/2018 23:43   US Pelvis Complete  Result Date: 02/01/2018 CLINICAL DATA:  Initial evaluation for acute pelvic pain, vaginal discharge. EXAM: TRANSABDOMINAL AND TRANSVAGINAL ULTRASOUND OF PELVIS DOPPLER ULTRASOUND OF OVARIES TECHNIQUE: Both transabdominal and transvaginal ultrasound examinations of the pelvis were performed. Transabdominal technique was performed for global imaging of the pelvis including uterus, ovaries, adnexal regions, and pelvic cul-de-sac. It was necessary to proceed with endovaginal exam following the transabdominal exam to visualize the uterus, endometrium, and ovaries. Color and duplex Doppler ultrasound was utilized to evaluate blood flow to the ovaries. COMPARISON:  None. FINDINGS: Uterus Measurements: 7.6 x 4.4 x 5.4 cm. No fibroids or other mass visualized. Endometrium Thickness: 7.1 mm. No focal abnormality visualized. IUD in appropriate position within the endometrial cavity. Right ovary Measurements: 4.3 x 2.4 x 1.8 cm. Right ovary poorly visualized via transvaginal technique due to posterior positioning, but grossly normal in appearance without mass lesion or other abnormality. Left ovary Measurements: 8.4 x 4.4 x 5.3 cm. 3.7 x 3.5 x 3.5 cm complex cyst with internal lace-like architecture, most consistent with a hemorrhagic cyst. Second hypoechoic lesion measuring 5.2 x 3.9 x 4.9 cm with fairly homogeneous low-level internal echoes. Finding could reflect a second hemorrhagic cyst or possibly endometrioma. Question of possible mural nodularity (image 113). Pulsed Doppler evaluation of the left ovary demonstrates normal low-resistance arterial and venous waveforms. Doppler waveforms of the right ovary was unable to be  obtained due to ovarian position. Gross color-flow is thought to be maintained within the ovary. Other findings No abnormal free fluid. IMPRESSION: 1. 5.2 cm complex hypoechoic left ovarian lesion, indeterminate. Primary differential considerations include a possible hemorrhagic cyst, enlarged physiologic cyst with internal proteinaceous debris, or possibly endometrioma. Question of focal mural nodularity within this lesion, not entirely certain. A short interval follow-up ultrasound in 6-12 weeks is recommended for further evaluation. 2. Second 3.7 cm complex left ovarian cyst, most consistent with a benign hemorrhagic cyst. 3. No other acute abnormality within the pelvis. No sonographic evidence for torsion within the left ovary. Doppler evaluation of the right ovary limited due to positioning, however, the right ovary is grossly normal in appearance. 4. IUD in appropriate position within the endometrial canal. Electronically Signed   By: Rise Mu M.D.   On: 02/01/2018 23:43   US Pelvic Doppler (torsion R/o Or Mass Arterial Flow)  Result Date: 02/01/2018 CLINICAL DATA:  Initial evaluation for acute pelvic pain, vaginal discharge. EXAM: TRANSABDOMINAL AND TRANSVAGINAL ULTRASOUND OF PELVIS DOPPLER ULTRASOUND OF OVARIES TECHNIQUE: Both transabdominal and transvaginal ultrasound examinations of the pelvis were performed.  Transabdominal technique was performed for global imaging of the pelvis including uterus, ovaries, adnexal regions, and pelvic cul-de-sac. It was necessary to proceed with endovaginal exam following the transabdominal exam to visualize the uterus, endometrium, and ovaries. Color and duplex Doppler ultrasound was utilized to evaluate blood flow to the ovaries. COMPARISON:  None. FINDINGS: Uterus Measurements: 7.6 x 4.4 x 5.4 cm. No fibroids or other mass visualized. Endometrium Thickness: 7.1 mm. No focal abnormality visualized. IUD in appropriate position within the endometrial cavity.  Right ovary Measurements: 4.3 x 2.4 x 1.8 cm. Right ovary poorly visualized via transvaginal technique due to posterior positioning, but grossly normal in appearance without mass lesion or other abnormality. Left ovary Measurements: 8.4 x 4.4 x 5.3 cm. 3.7 x 3.5 x 3.5 cm complex cyst with internal lace-like architecture, most consistent with a hemorrhagic cyst. Second hypoechoic lesion measuring 5.2 x 3.9 x 4.9 cm with fairly homogeneous low-level internal echoes. Finding could reflect a second hemorrhagic cyst or possibly endometrioma. Question of possible mural nodularity (image 113). Pulsed Doppler evaluation of the left ovary demonstrates normal low-resistance arterial and venous waveforms. Doppler waveforms of the right ovary was unable to be obtained due to ovarian position. Gross color-flow is thought to be maintained within the ovary. Other findings No abnormal free fluid. IMPRESSION: 1. 5.2 cm complex hypoechoic left ovarian lesion, indeterminate. Primary differential considerations include a possible hemorrhagic cyst, enlarged physiologic cyst with internal proteinaceous debris, or possibly endometrioma. Question of focal mural nodularity within this lesion, not entirely certain. A short interval follow-up ultrasound in 6-12 weeks is recommended for further evaluation. 2. Second 3.7 cm complex left ovarian cyst, most consistent with a benign hemorrhagic cyst. 3. No other acute abnormality within the pelvis. No sonographic evidence for torsion within the left ovary. Doppler evaluation of the right ovary limited due to positioning, however, the right ovary is grossly normal in appearance. 4. IUD in appropriate position within the endometrial canal. Electronically Signed   By: Rise Mu M.D.   On: 02/01/2018 23:43        Assessment/Plan:     37 yo with left ovarian cysts 1. Cysts likely hemorrhagic. Patient is managing with pain. She will follow up in 6 weeks for a repeat US to see if the  cysts have resolved. 2. Questionable vaginitis, wet mount in hospital + for clue cells and wbc. Patient will continue flagyl for 1 week. MDL send out lab collected and sent today. Discontinue doxycycline and kephlex  Follow up in 6 weeks   Adelene Idler MD [facility], [facility] 02/03/18 2:54 PM

## 2018-02-06 ENCOUNTER — Encounter: Payer: Self-pay | Admitting: Emergency Medicine

## 2018-02-06 ENCOUNTER — Emergency Department: Payer: Self-pay

## 2018-02-06 ENCOUNTER — Emergency Department
Admission: EM | Admit: 2018-02-06 | Discharge: 2018-02-07 | Disposition: A | Discharge status: Home / Self Care | Payer: Self-pay | Attending: Emergency Medicine | Admitting: Emergency Medicine

## 2018-02-06 DIAGNOSIS — E039 Hypothyroidism, unspecified: Secondary | ICD-10-CM | POA: Insufficient documentation

## 2018-02-06 DIAGNOSIS — Z79899 Other long term (current) drug therapy: Secondary | ICD-10-CM | POA: Insufficient documentation

## 2018-02-06 DIAGNOSIS — R197 Diarrhea, unspecified: Secondary | ICD-10-CM

## 2018-02-06 DIAGNOSIS — K529 Noninfective gastroenteritis and colitis, unspecified: Secondary | ICD-10-CM

## 2018-02-06 DIAGNOSIS — A029 Salmonella infection, unspecified: Secondary | ICD-10-CM

## 2018-02-06 DIAGNOSIS — R509 Fever, unspecified: Secondary | ICD-10-CM

## 2018-02-06 LAB — COMPREHENSIVE METABOLIC PANEL
ALBUMIN: 4.4 g/dL (ref 3.5–5.0)
ALT: 16 U/L (ref 14–54)
AST: 24 U/L (ref 15–41)
Alkaline Phosphatase: 71 U/L (ref 38–126)
Anion gap: 8 (ref 5–15)
BILIRUBIN TOTAL: 0.6 mg/dL (ref 0.3–1.2)
BUN: 16 mg/dL (ref 6–20)
CHLORIDE: 104 mmol/L (ref 101–111)
CO2: 24 mmol/L (ref 22–32)
Calcium: 9 mg/dL (ref 8.9–10.3)
Creatinine, Ser: 0.65 mg/dL (ref 0.44–1.00)
GFR calc Af Amer: >60 mL/min (ref >60)
Glucose, Bld: 148 mg/dL — ABNORMAL HIGH (ref 65–99)
POTASSIUM: 3.7 mmol/L (ref 3.5–5.1)
SODIUM: 136 mmol/L (ref 135–145)
TOTAL PROTEIN: 7.5 g/dL (ref 6.5–8.1)

## 2018-02-06 LAB — DIFFERENTIAL
BASOS ABS: 0 10*3/uL (ref 0–0.1)
Basophils Relative: 0 %
EOS ABS: 0.1 10*3/uL (ref 0–0.7)
EOS PCT: 1 %
Lymphocytes Relative: 7 %
Lymphs Abs: 0.6 10*3/uL — ABNORMAL LOW (ref 1.0–3.6)
Monocytes Absolute: 0.3 10*3/uL (ref 0.2–0.9)
Monocytes Relative: 4 %
NEUTROS PCT: 88 %
Neutro Abs: 7 10*3/uL — ABNORMAL HIGH (ref 1.4–6.5)

## 2018-02-06 LAB — CBC
HEMATOCRIT: 43.6 % (ref 35.0–47.0)
Hemoglobin: 14.9 g/dL (ref 12.0–16.0)
MCH: 29.8 pg (ref 26.0–34.0)
MCHC: 34.3 g/dL (ref 32.0–36.0)
MCV: 86.8 fL (ref 80.0–100.0)
Platelets: 221 10*3/uL (ref 150–440)
RBC: 5.02 MIL/uL (ref 3.80–5.20)
RDW: 14.3 % (ref 11.5–14.5)
WBC: 7.8 10*3/uL (ref 3.6–11.0)

## 2018-02-06 LAB — URINALYSIS, COMPLETE (UACMP) WITH MICROSCOPIC
BACTERIA UA: NONE SEEN
Bilirubin Urine: NEGATIVE
Glucose, UA: 50 mg/dL — AB
KETONES UR: NEGATIVE mg/dL
Leukocytes, UA: NEGATIVE
NITRITE: NEGATIVE
PH: 5 (ref 5.0–8.0)
Protein, ur: NEGATIVE mg/dL
SPECIFIC GRAVITY, URINE: 1.012 (ref 1.005–1.030)

## 2018-02-06 LAB — POCT PREGNANCY, URINE: PREG TEST UR: NEGATIVE

## 2018-02-06 LAB — LIPASE, BLOOD: LIPASE: 29 U/L (ref 11–51)

## 2018-02-06 MED ORDER — ACETAMINOPHEN 500 MG PO TABS
1000.0000 mg | ORAL_TABLET | Freq: Once | ORAL | Status: AC
  Administered 2018-02-06: 1000 mg via ORAL
  Filled 2018-02-06: qty 2

## 2018-02-06 MED ORDER — IOPAMIDOL (ISOVUE-300) INJECTION 61%
30.0000 mL | Freq: Once | INTRAVENOUS | Status: AC
Start: 2018-02-06 — End: 2018-02-06
  Administered 2018-02-06: 30 mL via ORAL

## 2018-02-06 MED ORDER — SODIUM CHLORIDE 0.9 % IV BOLUS
1000.0000 mL | Freq: Once | INTRAVENOUS | Status: AC
  Administered 2018-02-07: 1000 mL via INTRAVENOUS

## 2018-02-06 MED ORDER — ONDANSETRON HCL 4 MG/2ML IJ SOLN
4.0000 mg | Freq: Once | INTRAMUSCULAR | Status: AC
  Administered 2018-02-07: 4 mg via INTRAVENOUS
  Filled 2018-02-06: qty 2

## 2018-02-06 NOTE — ED Triage Notes (Signed)
Pt recent diagnosed with bacterial vaginosis as well as ovarian cyst. Pt top ED today due to fever at home and the recommendation to come in if fever occurred with infection.

## 2018-02-06 NOTE — ED Notes (Signed)
Patient transported to X-ray 

## 2018-02-06 NOTE — ED Provider Notes (Signed)
[facility] Emergency Department Provider Note   ____________________________________________   First MD Initiated Contact with Patient 02/06/18 2311     (approximate)  I have reviewed the triage vital signs and the nursing notes.   HISTORY  Chief Complaint Emesis and Generalized Body Aches    HPI Ayda Tancredi is a 37 y.o. female who returns to the ED from home now with a chief complaint of fever, body aches, abdominal pain diarrhea.  She was seen in the ED on 5/7 for a several week history of pelvic pain, dysfunctional uterine bleeding.  She was diagnosed with PID, ovarian cysts and started on doxycycline, Keflex and Flagyl for bacterial vaginosis.  Patient did follow-up with [facility] OB[facility]N 2 days later.  Per patient, and verified via clinic notes, she was taken off doxycycline and Keflex.  Patient reports overall pelvic pain is improved.  However, approximately 9 PM tonight she developed fever and chills.  Complains of nausea and one episode of emesis.  Does note several rounds of diarrhea.  Also complains of generalized body aches.  Had a sore throat several days ago but none today.  Denies chest pain, shortness of breath, dysuria.  Denies recent travel or trauma.   Past Medical History:  Diagnosis Date  . Anxiety   . Chicken pox   . Depression   . Eating disorder    Has had residential treatment and hospitalization previously.  Marland Kitchen History of anemia   . History of pulmonary embolus (PE)   . Hypothyroidism   . PTSD (post-traumatic stress disorder)     Patient Active Problem List   Diagnosis Date Noted  . Hx pulmonary embolism 02/03/2018  . Rash 10/16/2016  . Generalized anxiety disorder 10/12/2016  . Right knee pain 09/02/2016  . Severe recurrent major depression without psychotic features (HCC) 07/23/2016  . Bulimia nervosa, in partial remission, mild 07/23/2016    Class: Chronic  . Iron deficiency anemia 04/08/2016  . B12 deficiency  04/08/2016  . S/P gastric bypass 03/20/2016  . Eating disorder 03/20/2016  . Insomnia 03/20/2016  . PTSD (post-traumatic stress disorder) 03/20/2016  . History of hypothyroidism 03/20/2016    Past Surgical History:  Procedure Laterality Date  . CHOLECYSTECTOMY  2004  . GASTRIC BYPASS      Prior to Admission medications   Medication Sig Start Date End Date Taking? Authorizing Provider  cephALEXin (KEFLEX) 500 MG capsule Take 1 capsule (500 mg total) by mouth 3 (three) times daily for 10 days. 02/01/18 02/11/18  Schaevitz, Myra Rude, MD  ciprofloxacin (CIPRO) 500 MG tablet Take 1 tablet (500 mg total) by mouth 2 (two) times daily. 02/07/18   Irean Hong, MD  clonazePAM (KLONOPIN) 1 MG tablet Take 1 tablet (1 mg total) by mouth at bedtime. 01/25/17 01/25/18  Neysa Hotter, MD  doxycycline (VIBRAMYCIN) 100 MG capsule Take 1 capsule (100 mg total) by mouth 2 (two) times daily. 02/01/18   Schaevitz, Myra Rude, MD  gabapentin (NEURONTIN) 100 MG capsule Take 2 capsules (200 mg total) by mouth at bedtime. Patient not taking: Reported on 02/03/2018 06/09/17   Neysa Hotter, MD  levonorgestrel (MIRENA) 20 MCG/24HR IUD 1 each by Intrauterine route once.    [provider]  metroNIDAZOLE (FLAGYL) 500 MG tablet Take 1 tablet (500 mg total) by mouth 2 (two) times daily for 14 days. 02/01/18 02/15/18  Myrna Blazer, MD  Multiple Vitamin (MULTIVITAMIN) tablet Take 1 tablet by mouth daily.    [provider]  ondansetron (ZOFRAN ODT) 4 MG disintegrating tablet Take 1 tablet (4 mg total) by mouth every 8 (eight) hours as needed for nausea or vomiting. 02/07/18   Irean Hong, MD  sertraline (ZOLOFT) 100 MG tablet Take 3 tablets (300 mg total) by mouth daily. 06/09/17 06/09/18  Neysa Hotter, MD    Allergies Penicillins; Azithromycin; and Other  Family History  Problem Relation Age of Onset  . Hypertension Mother   . Hypertension Father   . Mental retardation Father   . Diabetes  Father   . Anxiety disorder Father   . Prostate cancer Paternal Grandfather     Social History Social History   Tobacco Use  . Smoking status: Never Smoker  . Smokeless tobacco: Never Used  Substance Use Topics  . Alcohol use: Yes    Alcohol/week: 0.0 - 0.6 oz    Comment: rare  . Drug use: No    Review of Systems  Constitutional: Positive for fever/chills. Eyes: No visual changes. ENT: No sore throat. Cardiovascular: Denies chest pain. Respiratory: Denies shortness of breath. Gastrointestinal: Positive for abdominal pain, nausea, vomiting and diarrhea.  No constipation. Genitourinary: Negative for dysuria. Musculoskeletal: Positive for back pain. Skin: Negative for rash. Neurological: Negative for headaches, focal weakness or numbness.   ____________________________________________   PHYSICAL EXAM:  VITAL SIGNS: ED Triage Vitals  Enc Vitals Group     BP 02/06/18 2154 (!) 128/92     Pulse Rate 02/06/18 2154 (!) 102     Resp 02/06/18 2154 18     Temp 02/06/18 2154 (!) 103.2 F (39.6 C)     Temp Source 02/06/18 2154 Oral     SpO2 02/06/18 2154 97 %     Weight 02/06/18 2154 244 lb (110.7 kg)     Height --      Head Circumference --      Peak Flow --      Pain Score 02/06/18 2234 6     Pain Loc --      Pain Edu? --      Excl. in GC? --     Constitutional: Alert and oriented. Well appearing and in no acute distress. Eyes: Conjunctivae are normal. PERRL. EOMI. Head: Atraumatic. Nose: No congestion/rhinnorhea. Mouth/Throat: Mucous membranes are moist.  Oropharynx non-erythematous. Neck: No stridor.  Supple neck without meningismus. Hematological/Lymphatic/Immunilogical: No cervical lymphadenopathy. Cardiovascular: Normal rate, regular rhythm. Grossly normal heart sounds.  Good peripheral circulation. Respiratory: Normal respiratory effort.  No retractions. Lungs CTAB. Gastrointestinal: Soft and mildly diffusely tender to palpation without rebound or guarding.  No distention. No abdominal bruits. No CVA tenderness. Musculoskeletal: No lower extremity tenderness nor edema.  No joint effusions. Neurologic:  Normal speech and language. No gross focal neurologic deficits are appreciated. No gait instability. Skin:  Skin is warm, dry and intact. No rash noted.  No petechiae. Psychiatric: Mood and affect are normal. Speech and behavior are normal.  ____________________________________________   LABS (all labs ordered are listed, but only abnormal results are displayed)  Labs Reviewed  GASTROINTESTINAL PANEL BY PCR, STOOL (REPLACES STOOL CULTURE) - Abnormal; Notable for the following components:      Result Value   Salmonella species DETECTED (*)    All other components within normal limits  COMPREHENSIVE METABOLIC PANEL - Abnormal; Notable for the following components:   Glucose, Bld 148 (*)    All other components within normal limits  URINALYSIS, COMPLETE (UACMP) WITH MICROSCOPIC - Abnormal; Notable for the following components:   Color, Urine YELLOW (*)  APPearance CLEAR (*)    Glucose, UA 50 (*)    Hgb urine dipstick MODERATE (*)    All other components within normal limits  DIFFERENTIAL - Abnormal; Notable for the following components:   Neutro Abs 7.0 (*)    Lymphs Abs 0.6 (*)    All other components within normal limits  C DIFFICILE QUICK SCREEN W PCR REFLEX  CULTURE, BLOOD (ROUTINE X 2)  CULTURE, BLOOD (ROUTINE X 2)  LIPASE, BLOOD  CBC  LACTIC ACID, PLASMA  INFLUENZA PANEL BY PCR (TYPE A & B)  MONONUCLEOSIS SCREEN  LACTIC ACID, PLASMA  CBC WITH DIFFERENTIAL/PLATELET  POC URINE PREG, ED  POCT PREGNANCY, URINE   ____________________________________________  EKG  None ____________________________________________  RADIOLOGY  ED MD interpretation: No pneumonia on chest x-ray; ileitis seen on CT scan; stable left ovarian cyst  Official radiology report(s): Dg Chest 2 View  Result Date: 02/07/2018 CLINICAL DATA:  Acute  onset of fever and generalized body aches. Recently diagnosed with bacterial vaginosis. EXAM: CHEST - 2 VIEW COMPARISON:  Chest radiograph performed 06/12/2016 FINDINGS: The lungs are well-aerated. Minimal left basilar opacity likely reflects atelectasis. There is no evidence of pleural effusion or pneumothorax. The heart is normal in size; the mediastinal contour is within normal limits. No acute osseous abnormalities are seen. IMPRESSION: Minimal left basilar opacity likely reflects atelectasis. Lungs otherwise clear. Electronically Signed   By: Roanna Raider M.D.   On: 02/07/2018 00:22   Ct Abdomen Pelvis W Contrast  Result Date: 02/07/2018 CLINICAL DATA:  Acute onset of fever. Recently diagnosed with bacterial vaginosis. EXAM: CT ABDOMEN AND PELVIS WITH CONTRAST TECHNIQUE: Multidetector CT imaging of the abdomen and pelvis was performed using the standard protocol following bolus administration of intravenous contrast. CONTRAST:  ISOVUE-370 IOPAMIDOL (ISOVUE-370) INJECTION 76% COMPARISON:  Pelvic ultrasound performed 02/01/2018 FINDINGS: Lower chest: The visualized lung bases are grossly clear. The visualized portions of the mediastinum are unremarkable. Hepatobiliary: The liver is unremarkable in appearance. The patient is status post cholecystectomy, with clips noted at the gallbladder fossa. The common bile duct remains normal in caliber. Pancreas: The pancreas is within normal limits. Spleen: The spleen is unremarkable in appearance. Adrenals/Urinary Tract: The adrenal glands are unremarkable in appearance. The kidneys are within normal limits. There is no evidence of hydronephrosis. No renal or ureteral stones are identified. No perinephric stranding is seen. Stomach/Bowel: The stomach is unremarkable in appearance. Mild wall thickening is suggested along the distal ileum, which may reflect a mild infectious or inflammatory ileitis. The appendix is normal in caliber, without evidence of  appendicitis. The colon is unremarkable in appearance. Vascular/Lymphatic: The abdominal aorta is unremarkable in appearance. The inferior vena cava is grossly unremarkable. No retroperitoneal lymphadenopathy is seen. No pelvic sidewall lymphadenopathy is identified. Reproductive: The bladder is mildly distended and grossly unremarkable. The uterus is grossly unremarkable in appearance, with an intrauterine device noted in expected position at the fundus of the uterus. A 4.3 cm left adnexal cystic focus is noted. Other: No additional soft tissue abnormalities are seen. Musculoskeletal: No acute osseous abnormalities are identified. Chronic bilateral pars defects are noted at L5, without evidence of anterolisthesis. The visualized musculature is unremarkable in appearance. IMPRESSION: 1. Mild wall thickening suggested along the distal ileum, which may reflect a mild infectious or inflammatory ileitis. 2. 4.3 cm left adnexal cystic focus is mildly less prominent than on the recent prior pelvic ultrasound. As before, interval follow-up ultrasound of the pelvis was recommended in 5 - 11 weeks  for further evaluation. 3. Chronic bilateral pars defects at L5, without evidence of anterolisthesis. Electronically Signed   By: Roanna Raider M.D.   On: 02/07/2018 02:21    ____________________________________________   PROCEDURES  Procedure(s) performed: None Pelvic exam deferred as patient recently had one in the ED and again with OB/GYN. She is not complaining of pelvic pain nor vaginal discharge.  Procedures  Critical Care performed: No  ____________________________________________   INITIAL IMPRESSION / ASSESSMENT AND PLAN / ED COURSE  As part of my medical decision making, I reviewed the following data within the electronic MEDICAL RECORD NUMBER Nursing notes reviewed and incorporated, Labs reviewed, Old chart reviewed, Radiograph reviewed, and Notes from prior ED visits   37 year old female on Flagyl  for bacterial vaginosis who returns to the ED now with fever, myalgias, abdominal pain and diarrhea. Differential diagnosis includes, but is not limited to, ovarian cyst, ovarian torsion, acute appendicitis, diverticulitis,  C. difficile colitis, urinary tract infection/pyelonephritis, endometriosis, bowel obstruction, colitis, renal colic, gastroenteritis, hernia, fibroids, endometriosis, pregnancy related pain including ectopic pregnancy, etc.  Patient presents febrile; WBC within normal limits, electrolytes and urinalysis unremarkable.  Although patient is febrile, she is otherwise hemodynamically stable without tachycardia, tachypnea or hypotension.  Will hold calling ED sepsis at this time pending results of lactic acid.  There is no clear indication of IV antibiotic necessity currently.  Will check influenza, mono, stool studies.  Will initiate IV fluid resuscitation, IV Zofran and proceed to CT abdomen/pelvis to evaluate for intra-abdominal etiology of patient's symptoms.   Clinical Course as of Feb 07 526  Mon Feb 07, 2018  0202 Lactic is negative.  Will not call code sepsis.   [JS]  [id number] She feeling better.  Repeat temperature 99.2 F.  Updated her of all results except for bio fire panel which has not come back yet.  Will start Cipro in addition to the Flagyl she is already taking.  Will refer her to GI for outpatient follow-up to evaluate possible inflammatory bowel disease.  Strict return precautions given.  Patient and family member verbalized understanding and agree with plan of care.   [JS]  [id number] Lab called with + Salmonella. Given patient's CT scan concerning for ilieitis, will proceed with Cipro and GI follow-up.   [JS]    Clinical Course User Index [JS] Irean Hong, MD     ____________________________________________   FINAL CLINICAL IMPRESSION(S) / ED DIAGNOSES  Final diagnoses:  Fever, unspecified fever cause  Diarrhea, unspecified type  Ileitis  Salmonella      ED Discharge Orders        Ordered    ciprofloxacin (CIPRO) 500 MG tablet  2 times daily     02/07/18 0318    ondansetron (ZOFRAN ODT) 4 MG disintegrating tablet  Every 8 hours PRN     02/07/18 0318       Note:  This document was prepared using Dragon voice recognition software and may include unintentional dictation errors.    Irean Hong, MD 02/07/18 [id number]

## 2018-02-06 NOTE — ED Notes (Signed)
Report given to Noel

## 2018-02-07 ENCOUNTER — Emergency Department: Payer: Self-pay

## 2018-02-07 ENCOUNTER — Encounter: Payer: Self-pay | Admitting: Radiology

## 2018-02-07 LAB — LACTIC ACID, PLASMA: LACTIC ACID, VENOUS: 0.8 mmol/L (ref 0.5–1.9)

## 2018-02-07 LAB — GASTROINTESTINAL PANEL BY PCR, STOOL (REPLACES STOOL CULTURE)
ASTROVIRUS: NOT DETECTED
Adenovirus F40/41: NOT DETECTED
Campylobacter species: NOT DETECTED
Cryptosporidium: NOT DETECTED
Cyclospora cayetanensis: NOT DETECTED
ENTAMOEBA HISTOLYTICA: NOT DETECTED
ENTEROAGGREGATIVE E COLI (EAEC): NOT DETECTED
ENTEROTOXIGENIC E COLI (ETEC): NOT DETECTED
Enteropathogenic E coli (EPEC): NOT DETECTED
GIARDIA LAMBLIA: NOT DETECTED
NOROVIRUS GI/GII: NOT DETECTED
Plesimonas shigelloides: NOT DETECTED
Rotavirus A: NOT DETECTED
Salmonella species: DETECTED — AB
Sapovirus (I, II, IV, and V): NOT DETECTED
Shiga like toxin producing E coli (STEC): NOT DETECTED
Shigella/Enteroinvasive E coli (EIEC): NOT DETECTED
VIBRIO CHOLERAE: NOT DETECTED
Vibrio species: NOT DETECTED
Yersinia enterocolitica: NOT DETECTED

## 2018-02-07 LAB — MONONUCLEOSIS SCREEN: Mono Screen: NEGATIVE

## 2018-02-07 LAB — INFLUENZA PANEL BY PCR (TYPE A & B)
INFLBPCR: NEGATIVE
Influenza A By PCR: NEGATIVE

## 2018-02-07 LAB — C DIFFICILE QUICK SCREEN W PCR REFLEX
C DIFFICILE (CDIFF) INTERP: NOT DETECTED
C DIFFICILE (CDIFF) TOXIN: NEGATIVE
C Diff antigen: NEGATIVE

## 2018-02-07 MED ORDER — CIPROFLOXACIN HCL 500 MG PO TABS
500.0000 mg | ORAL_TABLET | Freq: Once | ORAL | Status: AC
  Administered 2018-02-07: 500 mg via ORAL
  Filled 2018-02-07: qty 1

## 2018-02-07 MED ORDER — CIPROFLOXACIN HCL 500 MG PO TABS: 500.0000 mg | ORAL_TABLET | Freq: Two times a day (BID) | ORAL | 0 refills | Status: DC

## 2018-02-07 MED ORDER — ONDANSETRON 4 MG PO TBDP: 4.0000 mg | ORAL_TABLET | Freq: Three times a day (TID) | ORAL | 0 refills | Status: DC | PRN

## 2018-02-07 MED ORDER — IOPAMIDOL (ISOVUE-370) INJECTION 76%
100.0000 mL | Freq: Once | INTRAVENOUS | Status: AC | PRN
  Administered 2018-02-07: 100 mL via INTRAVENOUS

## 2018-02-07 MED ORDER — ACETAMINOPHEN 500 MG PO TABS
1000.0000 mg | ORAL_TABLET | Freq: Once | ORAL | Status: AC
  Administered 2018-02-07: 1000 mg via ORAL
  Filled 2018-02-07: qty 2

## 2018-02-07 NOTE — Discharge Instructions (Addendum)
1.  You may take Tylenol as needed for fever greater than 100.4 F. 2.  Start antibiotic as prescribed (Cipro 500 mg twice daily for 5 days). 3.  You may take Zofran as needed for nausea. 4.  Clear liquids for 12 hours, then BRAT diet for 3 days, then slowly advance diet as tolerated. 5.  Return to the ER for worsening symptoms, persistent vomiting, difficulty breathing or other concerns.

## 2018-02-10 ENCOUNTER — Ambulatory Visit
Admission: EM | Admit: 2018-02-10 | Discharge: 2018-02-10 | Disposition: A | Discharge status: Home / Self Care | Payer: Self-pay | Attending: Family Medicine | Admitting: Family Medicine

## 2018-02-10 DIAGNOSIS — K529 Noninfective gastroenteritis and colitis, unspecified: Secondary | ICD-10-CM

## 2018-02-10 MED ORDER — NYSTATIN 100000 UNIT/ML MT SUSP: 500000.0000 [IU] | Freq: Four times a day (QID) | OROMUCOSAL | 0 refills | Status: DC

## 2018-02-10 NOTE — Discharge Instructions (Signed)
Rest, fluids, fluids, fluids.  Call me or Dr. Wohl if yoServando Snare are not improving in the next 4-5 days.  Take care  Dr. Adriana Simas

## 2018-02-10 NOTE — ED Provider Notes (Signed)
[facility]   CSN: [id number] Arrival date & time: 02/10/18  2536  History   Chief Complaint Chief Complaint  Patient presents with  . GI Problem   HPI  37 year old female presents with abdominal pain.  Patient was recently seen in the ER on 2 occasions.  On the first occasion, she was diagnosed with presumed PID and placed on Flagyl and doxycycline.  STD testing was negative.  She did test positive for bacterial vaginosis.  There was no reports of urinary symptoms, however her urine was abnormal.  She was placed on Keflex for presumed UTI.  She saw OB/GYN for follow-up and all of her antibiotics were discontinued except for Flagyl.  She presented again to the ER on 5/12.  She presented with fever, body aches, abdominal pain, and diarrhea.  CT scan revealed ileitis.  GI panel was positive for Salmonella.  She was prescribed ciprofloxacin.  Patient presents today essentially for follow-up.  She has discontinued her Cipro due to adverse side effects (body aches, white patch on the tongue, muscle aches/calf pain).  Patient is concerned that she may need to be on antibiotics due to some mild infection.  She continues to feel poorly but is somewhat improved.  Ongoing diarrhea.  Last bout of bloody diarrhea was last night.  She feels feverish but no documented fever here.  Decreased p.o. intake.  She is tolerating fluids.  Past Medical History:  Diagnosis Date  . Anxiety   . Chicken pox   . Depression   . Eating disorder    Has had residential treatment and hospitalization previously.  Marland Kitchen History of anemia   . History of pulmonary embolus (PE)   . Hypothyroidism   . PTSD (post-traumatic stress disorder)     Patient Active Problem List   Diagnosis Date Noted  . Hx pulmonary embolism 02/03/2018  . Rash 10/16/2016  . Generalized anxiety disorder 10/12/2016  . Right knee pain 09/02/2016  . Severe recurrent major depression without psychotic features (HCC) 07/23/2016  . Bulimia  nervosa, in partial remission, mild 07/23/2016    Class: Chronic  . Iron deficiency anemia 04/08/2016  . B12 deficiency 04/08/2016  . S/P gastric bypass 03/20/2016  . Eating disorder 03/20/2016  . Insomnia 03/20/2016  . PTSD (post-traumatic stress disorder) 03/20/2016  . History of hypothyroidism 03/20/2016    Past Surgical History:  Procedure Laterality Date  . CHOLECYSTECTOMY  2004  . GASTRIC BYPASS      OB History    Gravida  0   Para  0   Term  0   Preterm  0   AB  0   Living  0     SAB  0   TAB  0   Ectopic  0   Multiple  0   Live Births  0            Home Medications    Prior to Admission medications   Medication Sig Start Date End Date Taking? Authorizing Provider  levonorgestrel (MIRENA) 20 MCG/24HR IUD 1 each by Intrauterine route once.   Yes [provider]  Multiple Vitamin (MULTIVITAMIN) tablet Take 1 tablet by mouth daily.   Yes [provider]  clonazePAM (KLONOPIN) 1 MG tablet Take 1 tablet (1 mg total) by mouth at bedtime. 01/25/17 01/25/18  Neysa Hotter, MD  gabapentin (NEURONTIN) 100 MG capsule Take 2 capsules (200 mg total) by mouth at bedtime. Patient not taking: Reported on 02/03/2018 06/09/17   Neysa Hotter, MD  nystatin (MYCOSTATIN) 100000 UNIT/ML suspension Take 5 mLs (500,000 Units total) by mouth 4 (four) times daily. Swish then swallow. 02/10/18   Tommie Sams, DO  ondansetron (ZOFRAN ODT) 4 MG disintegrating tablet Take 1 tablet (4 mg total) by mouth every 8 (eight) hours as needed for nausea or vomiting. 02/07/18   Irean Hong, MD  sertraline (ZOLOFT) 100 MG tablet Take 3 tablets (300 mg total) by mouth daily. 06/09/17 06/09/18  Neysa Hotter, MD    Family History Family History  Problem Relation Age of Onset  . Hypertension Mother   . Hypertension Father   . Mental retardation Father   . Diabetes Father   . Anxiety disorder Father   . Prostate cancer Paternal Grandfather     Social History Social  History   Tobacco Use  . Smoking status: Never Smoker  . Smokeless tobacco: Never Used  Substance Use Topics  . Alcohol use: Yes    Alcohol/week: 0.0 - 0.6 oz    Comment: rare  . Drug use: No     Allergies   Penicillins; Azithromycin; and Other   Review of Systems Review of Systems  Constitutional: Positive for appetite change.       Feels feverish.  Gastrointestinal: Positive for abdominal pain, blood in stool and diarrhea.    Physical Exam Triage Vital Signs ED Triage Vitals  Enc Vitals Group     BP 02/10/18 0954 116/64     Pulse Rate 02/10/18 0954 76     Resp 02/10/18 0954 18     Temp 02/10/18 0954 98.6 F (37 C)     Temp Source 02/10/18 0954 Oral     SpO2 02/10/18 0954 98 %     Weight 02/10/18 0957 244 lb (110.7 kg)     Height --      Head Circumference --      Peak Flow --      Pain Score 02/10/18 0957 7     Pain Loc --      Pain Edu? --      Excl. in GC? --    Updated Vital Signs BP 116/64 (BP Location: Left Arm)   Pulse 76   Temp 98.6 F (37 C) (Oral)   Resp 18   Wt 244 lb (110.7 kg)   LMP  (LMP Unknown)   SpO2 98%   BMI 43.22 kg/m   Physical Exam  Constitutional: She is oriented to person, place, and time. She appears well-developed.  No apparent distress but appears fatigued/mildly ill.  HENT:  Head: Normocephalic and atraumatic.  White patch noted on the tongue.  Cardiovascular: Normal rate and regular rhythm.  Pulmonary/Chest: Effort normal. No respiratory distress.  Abdominal:  Soft, nondistended.  Tender to palpation of the lower abdomen bilaterally.  Neurological: She is alert and oriented to person, place, and time.  Psychiatric: She has a normal mood and affect. Her behavior is normal.  Nursing note and vitals reviewed.  UC Treatments / Results  Labs (all labs ordered are listed, but only abnormal results are displayed) Labs Reviewed - No data to display  EKG None  Radiology No results found.  Procedures Procedures  (including critical care time)  Medications Ordered in UC Medications - No data to display  Initial Impression / Assessment and Plan / UC Course  I have reviewed the triage vital signs and the nursing notes.  Pertinent labs & imaging results that were available during my care of the patient were reviewed by me  and considered in my medical decision making (see chart for details).    37 year old female presents with ileitis secondary to Salmonella.  She has completed a sufficient duration of Flagyl.  She can now discontinue.  Stop Cipro.  No further antibiotics after discussing with GI Dr. Servando Snare.  Aggressive hydration.  Supportive care.  Additionally, placing her on nystatin for presumed thrush (due to antibiotics).  Final Clinical Impressions(s) / UC Diagnoses   Final diagnoses:  Ileitis     Discharge Instructions     Rest, fluids, fluids, fluids.  Call me or Dr. Servando Snare if you are not improving in the next 4-5 days.  Take care  Dr. Adriana Simas     ED Prescriptions    Medication Sig Dispense Auth. Provider   nystatin (MYCOSTATIN) 100000 UNIT/ML suspension Take 5 mLs (500,000 Units total) by mouth 4 (four) times daily. Swish then swallow. 100 mL Tommie Sams, DO     Controlled Substance Prescriptions [place] Controlled Substance Registry consulted? Not Applicable   Tommie Sams, DO 02/10/18 1102

## 2018-02-10 NOTE — ED Triage Notes (Signed)
Pt here for follow up for salmonella, states she is getting better. Did stop antibiotic they gave her in the ER. Stopped it last night due to body aches, white tongue, headache and blood in her stool. Wants a different antibiotic and still having diarrhea but not bloody anymore but is black stool.

## 2018-02-12 LAB — CULTURE, BLOOD (ROUTINE X 2)
Culture: NO GROWTH
Special Requests: ADEQUATE

## 2018-03-01 ENCOUNTER — Encounter: Payer: Self-pay | Admitting: Gastroenterology

## 2018-03-01 ENCOUNTER — Ambulatory Visit: Payer: Self-pay | Admitting: Gastroenterology

## 2018-03-01 DIAGNOSIS — N83209 Unspecified ovarian cyst, unspecified side: Secondary | ICD-10-CM | POA: Insufficient documentation

## 2018-03-21 ENCOUNTER — Ambulatory Visit: Payer: Self-pay | Admitting: Obstetrics and Gynecology

## 2018-03-21 ENCOUNTER — Other Ambulatory Visit: Payer: Self-pay

## 2018-03-22 ENCOUNTER — Other Ambulatory Visit: Payer: Self-pay

## 2018-03-22 ENCOUNTER — Encounter: Payer: Self-pay | Admitting: Emergency Medicine

## 2018-03-22 ENCOUNTER — Ambulatory Visit
Admission: EM | Admit: 2018-03-22 | Discharge: 2018-03-22 | Disposition: A | Discharge status: Home / Self Care | Payer: Medicaid Other | Attending: Family Medicine | Admitting: Family Medicine

## 2018-03-22 DIAGNOSIS — B9689 Other specified bacterial agents as the cause of diseases classified elsewhere: Secondary | ICD-10-CM

## 2018-03-22 DIAGNOSIS — N760 Acute vaginitis: Secondary | ICD-10-CM

## 2018-03-22 DIAGNOSIS — R102 Pelvic and perineal pain: Secondary | ICD-10-CM

## 2018-03-22 LAB — WET PREP, GENITAL
Sperm: NONE SEEN
TRICH WET PREP: NONE SEEN
YEAST WET PREP: NONE SEEN

## 2018-03-22 MED ORDER — METRONIDAZOLE 500 MG PO TABS: 500.0000 mg | ORAL_TABLET | Freq: Two times a day (BID) | ORAL | 0 refills | Status: DC

## 2018-03-22 MED ORDER — TRAMADOL HCL 50 MG PO TABS: 50.0000 mg | ORAL_TABLET | Freq: Three times a day (TID) | ORAL | 0 refills | Status: DC | PRN

## 2018-03-22 NOTE — ED Triage Notes (Signed)
Patient c/o vaginal itching and irritation since Sunday.  Patient denies vaginal discharge.

## 2018-03-22 NOTE — Discharge Instructions (Signed)
Medication as prescribed.  Call GYN.  Take care  Dr. Adriana Simasook

## 2018-03-22 NOTE — ED Provider Notes (Signed)
[facility]  CSN: [id number] Arrival date & time: 03/22/18  1728  History   Chief Complaint Chief Complaint  Patient presents with  . Vaginal Itching   HPI  37 year old female presents with lower abdominal pain as well as vaginal itching.  Patient reports that she has had ongoing vaginal itching for the past 2 weeks.  She states that she took Monistat and had improvement.  This is now returned.  She patient also reports that she has had severe lower abdominal pain as of today.  Severe at times.  She was scheduled to have a follow-up ultrasound regarding an ovarian cyst but did not do so due to scheduling issues.  She has had no fever.  No reports of vaginal discharge.  No other reported symptoms.  No other complaints/concerns at this time.  Past Medical History:  Diagnosis Date  . Anxiety   . Chicken pox   . Depression   . Eating disorder    Has had residential treatment and hospitalization previously.  Marland Kitchen History of anemia   . History of pulmonary embolus (PE)   . Hypothyroidism   . PTSD (post-traumatic stress disorder)     Patient Active Problem List   Diagnosis Date Noted  . Ovarian cyst 03/01/2018  . Hx pulmonary embolism 02/03/2018  . Rash 10/16/2016  . Generalized anxiety disorder 10/12/2016  . Right knee pain 09/02/2016  . Severe recurrent major depression without psychotic features (HCC) 07/23/2016  . Bulimia nervosa, in partial remission, mild 07/23/2016    Class: Chronic  . Iron deficiency anemia 04/08/2016  . B12 deficiency 04/08/2016  . S/P gastric bypass 03/20/2016  . Eating disorder 03/20/2016  . Insomnia 03/20/2016  . PTSD (post-traumatic stress disorder) 03/20/2016  . History of hypothyroidism 03/20/2016  . PCOS (polycystic ovarian syndrome) 12/14/2014    Past Surgical History:  Procedure Laterality Date  . CHOLECYSTECTOMY  2004  . GASTRIC BYPASS      OB History    Gravida  0   Para  0   Term  0   Preterm  0   AB  0   Living   0     SAB  0   TAB  0   Ectopic  0   Multiple  0   Live Births  0            Home Medications    Prior to Admission medications   Medication Sig Start Date End Date Taking? Authorizing Provider  levonorgestrel (MIRENA) 20 MCG/24HR IUD 1 each by Intrauterine route once.   Yes [provider]  Multiple Vitamin (MULTIVITAMIN) tablet Take 1 tablet by mouth daily.   Yes [provider]  sertraline (ZOLOFT) 100 MG tablet Take 3 tablets (300 mg total) by mouth daily. 06/09/17 06/09/18 Yes Hisada, Barbee Cough, MD  metroNIDAZOLE (FLAGYL) 500 MG tablet Take 1 tablet (500 mg total) by mouth 2 (two) times daily. 03/22/18   Tommie Sams, DO  nystatin (MYCOSTATIN) 100000 UNIT/ML suspension Take 5 mLs (500,000 Units total) by mouth 4 (four) times daily. Swish then swallow. 02/10/18   Tommie Sams, DO  ondansetron (ZOFRAN ODT) 4 MG disintegrating tablet Take 1 tablet (4 mg total) by mouth every 8 (eight) hours as needed for nausea or vomiting. 02/07/18   Irean Hong, MD  traMADol (ULTRAM) 50 MG tablet Take 1 tablet (50 mg total) by mouth every 8 (eight) hours as needed. 03/22/18   Tommie Sams, DO    Family  History Family History  Problem Relation Age of Onset  . Hypertension Mother   . Hypertension Father   . Mental retardation Father   . Diabetes Father   . Anxiety disorder Father   . Prostate cancer Paternal Grandfather     Social History Social History   Tobacco Use  . Smoking status: Never Smoker  . Smokeless tobacco: Never Used  Substance Use Topics  . Alcohol use: Yes    Alcohol/week: 0.0 - 0.6 oz    Comment: rare  . Drug use: No     Allergies   Penicillins; Azithromycin; and Other   Review of Systems Review of Systems  Constitutional: Negative.   Gastrointestinal: Positive for abdominal pain.  Genitourinary:       Vaginal itching.   Physical Exam Triage Vital Signs ED Triage Vitals  Enc Vitals Group     BP 03/22/18 1743 111/86     Pulse Rate  03/22/18 1743 65     Resp 03/22/18 1743 16     Temp 03/22/18 1743 98.1 F (36.7 C)     Temp Source 03/22/18 1743 Oral     SpO2 03/22/18 1743 100 %     Weight 03/22/18 1740 244 lb (110.7 kg)     Height 03/22/18 1740 5\' 3"  (1.6 m)     Head Circumference --      Peak Flow --      Pain Score 03/22/18 1740 2     Pain Loc --      Pain Edu? --      Excl. in GC? --    Updated Vital Signs BP 111/86 (BP Location: Left Arm)   Pulse 65   Temp 98.1 F (36.7 C) (Oral)   Resp 16   Ht 5\' 3"  (1.6 m)   Wt 244 lb (110.7 kg)   SpO2 100%   BMI 43.22 kg/m     Physical Exam  Constitutional: She is oriented to person, place, and time. She appears well-developed. No distress.  HENT:  Head: Normocephalic and atraumatic.  Cardiovascular: Normal rate and regular rhythm.  Pulmonary/Chest: Effort normal and breath sounds normal. She has no wheezes. She has no rales.  Abdominal: Soft.  Mild Left lower quadrant tenderness.  Neurological: She is alert and oriented to person, place, and time.  Psychiatric: She has a normal mood and affect. Her behavior is normal.  Nursing note and vitals reviewed.  UC Treatments / Results  Labs (all labs ordered are listed, but only abnormal results are displayed) Labs Reviewed  WET PREP, GENITAL - Abnormal; Notable for the following components:      Result Value   Clue Cells Wet Prep HPF POC PRESENT (*)    WBC, Wet Prep HPF POC MODERATE (*)    All other components within normal limits   EKG None  Radiology No results found.  Procedures Procedures (including critical care time)  Medications Ordered in UC Medications - No data to display  Initial Impression / Assessment and Plan / UC Course  I have reviewed the triage vital signs and the nursing notes.  Pertinent labs & imaging results that were available during my care of the patient were reviewed by me and considered in my medical decision making (see chart for details).    37 year old female  presents with abdominal pain and vaginal itching.  Wet prep consistent with BV.  Treated with Flagyl.  Regarding her abdominal pain, this is likely related to her ovarian cyst.  I  advised her that she needs follow-up ultrasound with her OB/GYN.  She is amenable to this.  Tramadol as needed for pain.  Final Clinical Impressions(s) / UC Diagnoses   Final diagnoses:  Pelvic pain  Bacterial vaginosis     Discharge Instructions     Medication as prescribed.  Call GYN.  Take care  Dr. Adriana Simas     ED Prescriptions    Medication Sig Dispense Auth. Provider   metroNIDAZOLE (FLAGYL) 500 MG tablet Take 1 tablet (500 mg total) by mouth 2 (two) times daily. 14 tablet Jemery Stacey G, DO   traMADol (ULTRAM) 50 MG tablet Take 1 tablet (50 mg total) by mouth every 8 (eight) hours as needed. 15 tablet Tommie Sams, DO     Controlled Substance Prescriptions [place] Controlled Substance Registry consulted? Not Applicable   Tommie Sams, [place] 03/22/18 [phone]

## 2018-03-30 ENCOUNTER — Ambulatory Visit (INDEPENDENT_AMBULATORY_CARE_PROVIDER_SITE_OTHER): Payer: Self-pay

## 2018-03-30 ENCOUNTER — Encounter: Payer: Self-pay | Admitting: Obstetrics and Gynecology

## 2018-03-30 ENCOUNTER — Ambulatory Visit (INDEPENDENT_AMBULATORY_CARE_PROVIDER_SITE_OTHER): Payer: Medicaid Other | Admitting: Obstetrics and Gynecology

## 2018-03-30 VITALS — BP 116/72 | HR 54 | Ht 63.0 in | Wt 246.0 lb

## 2018-03-30 DIAGNOSIS — N83202 Unspecified ovarian cyst, left side: Secondary | ICD-10-CM

## 2018-03-30 DIAGNOSIS — N801 Endometriosis of ovary: Secondary | ICD-10-CM

## 2018-03-30 DIAGNOSIS — F419 Anxiety disorder, unspecified: Secondary | ICD-10-CM

## 2018-03-30 DIAGNOSIS — F329 Major depressive disorder, single episode, unspecified: Secondary | ICD-10-CM

## 2018-03-30 DIAGNOSIS — N83209 Unspecified ovarian cyst, unspecified side: Secondary | ICD-10-CM

## 2018-03-30 DIAGNOSIS — F32A Depression, unspecified: Secondary | ICD-10-CM

## 2018-03-30 MED ORDER — SERTRALINE HCL 100 MG PO TABS: 100.0000 mg | ORAL_TABLET | Freq: Every day | ORAL | 0 refills | Status: DC

## 2018-03-30 MED ORDER — CLONAZEPAM 0.5 MG PO TABS: 0.5000 mg | ORAL_TABLET | Freq: Two times a day (BID) | ORAL | 0 refills | Status: DC | PRN

## 2018-03-30 NOTE — Progress Notes (Signed)
Patient ID: Sharon Gibson, female   DOB: 1980/12/30, 37 y.o.   MRN: [id number]  Reason for Consult: Follow-up (GYN U/S)   Referred by Schuman, Christanna R, *  Subjective:     HPI:  Sharon CatenaKristen Gibson is a 37 y.o. female  She is following up for a left ovarian cyst. She has had some continued pelvic pain which was treated at an urgent care with Tramadol. She has not taken this medication. She would like refills today of her antidepressant and anti anxiety medications because she has not been able to see her primary care physician in over a year. She is interested in surgery to remove the left ovarian cyst. She has a history of pulmonary embolism when she was younger. She has not been on anticoagulation. She says at the time she was inpatient for an eating disorder and her activity was limited. She reports her weight was similar to now.   Past Medical History:  Diagnosis Date  . Anxiety   . Chicken pox   . Depression   . Eating disorder    Has had residential treatment and hospitalization previously.  Marland Kitchen. History of anemia   . History of pulmonary embolus (PE)   . Hypothyroidism   . PTSD (post-traumatic stress disorder)    Family History  Problem Relation Age of Onset  . Hypertension Mother   . Hypertension Father   . Mental retardation Father   . Diabetes Father   . Anxiety disorder Father   . Prostate cancer Paternal Grandfather    Past Surgical History:  Procedure Laterality Date  . CHOLECYSTECTOMY  2004  . GASTRIC BYPASS      Short Social History:  Social History   Tobacco Use  . Smoking status: Never Smoker  . Smokeless tobacco: Never Used  Substance Use Topics  . Alcohol use: Yes    Alcohol/week: 0.0 - 0.6 oz    Comment: rare    Allergies  Allergen Reactions  . Penicillins Hives  . Azithromycin   . Other     Narcotics- Burning in the stomach (whether PO or IV)    Current Outpatient Medications  Medication Sig Dispense Refill  . levonorgestrel  (MIRENA) 20 MCG/24HR IUD 1 each by Intrauterine route once.    . metroNIDAZOLE (FLAGYL) 500 MG tablet Take 1 tablet (500 mg total) by mouth 2 (two) times daily. 14 tablet 0  . Multiple Vitamin (MULTIVITAMIN) tablet Take 1 tablet by mouth daily.    Marland Kitchen. nystatin (MYCOSTATIN) 100000 UNIT/ML suspension Take 5 mLs (500,000 Units total) by mouth 4 (four) times daily. Swish then swallow. 100 mL 0  . ondansetron (ZOFRAN ODT) 4 MG disintegrating tablet Take 1 tablet (4 mg total) by mouth every 8 (eight) hours as needed for nausea or vomiting. 20 tablet 0  . sertraline (ZOLOFT) 100 MG tablet Take 1 tablet (100 mg total) by mouth daily. 90 tablet 0  . traMADol (ULTRAM) 50 MG tablet Take 1 tablet (50 mg total) by mouth every 8 (eight) hours as needed. 15 tablet 0  . clonazePAM (KLONOPIN) 0.5 MG tablet Take 1 tablet (0.5 mg total) by mouth 2 (two) times daily as needed for anxiety. 60 tablet 0   No current facility-administered medications for this visit.     Review of Systems  Constitutional: Negative for chills, fatigue, fever and unexpected weight change.  HENT: Negative for trouble swallowing.  Eyes: Negative for loss of vision.  Respiratory: Negative for cough, shortness of breath and wheezing.  Cardiovascular:  Negative for chest pain, leg swelling, palpitations and syncope.  GI: Positive for abdominal pain. Negative for blood in stool, diarrhea, nausea and vomiting.  GU: Negative for difficulty urinating, dysuria, frequency and hematuria.  Musculoskeletal: Negative for back pain, leg pain and joint pain.  Skin: Negative for rash.  Neurological: Negative for dizziness, headaches, light-headedness, numbness and seizures.  Psychiatric: Negative for behavioral problem, confusion, depressed mood and sleep disturbance.        Objective:  Objective   Vitals:   03/30/18 1501  BP: 116/72  Pulse: (!) 54  Weight: 246 lb (111.6 kg)  Height: 5\' 3"  (1.6 m)   Body mass index is 43.58 kg/m.  Physical  Exam  Constitutional: She is oriented to person, place, and time. She appears well-developed and well-nourished.  HENT:  Head: Normocephalic and atraumatic.  Eyes: Pupils are equal, round, and reactive to light. EOM are normal.  Cardiovascular: Normal rate, regular rhythm and normal heart sounds.  Pulmonary/Chest: Effort normal. No respiratory distress.  Abdominal: Soft.  Neurological: She is alert and oriented to person, place, and time.  Skin: Skin is warm and dry.  Psychiatric: She has a normal mood and affect. Her behavior is normal. Judgment and thought content normal.  Nursing note and vitals reviewed.   US Pelvis Transvanginal Non-ob (tv Only)  Result Date: 03/30/2018 ULTRASOUND REPORT Patient Name: Sharon Gibson DOB: 1980-10-28 MRN: [id number] Location: [facility] Date of Service: 03/30/2018 Indications:f/u Left ovarian cyst Findings: The uterus is anteverted and measures 8.22 x 4.52 x 4.00cm. Echo texture is homogenous without evidence of focal masses. The Endometrium measures 3.13 mm. - Mirena IUD is in the correct location in the fundus of the uterus Right Ovary measures 3.05 x 2.51 x 1.65 cm. It is normal in appearance. Left Ovary measures 2.72 x 3.00 x 3.59 cm. It is not normal in appearance. - Endometrioma measuring 4.79 x 3.9cm - no evidence of ovarian torsion Survey of the adnexa demonstrates no adnexal masses. There is no free fluid in the cul de sac. Impression: 1. Left ovarian endometrioma Recommendations: 1.Clinical correlation with the patient's History and Physical Exam. Willette Alma, RDMS, RVT I have reviewed this ultrasound and the report. I agree with the above assessment and plan. Adelene Idler MD [facility] 03/30/18 3:06 PM         Assessment/Plan:    37 yo with left ovarian cyst, likely endometrioma Will plan for laparoscopic left ovarian cystectomy, possible left oophorectomy. Will obtain CA125 as part of her work up  and preoperative evaluation. Order placed today. Prescriptions for refill medications sent. Will need to plan with her hematologist if she will need anticoagulation during surgery. She was given an ACOG handout on ovarian cysts.  Adelene Idler MD [facility], [facility] 03/30/18 3:35 PM

## 2018-04-01 ENCOUNTER — Telehealth: Payer: Self-pay | Admitting: Obstetrics and Gynecology

## 2018-04-01 NOTE — Telephone Encounter (Signed)
-----   Message from Natale Milchhristanna R Schuman, MD sent at 03/30/2018  3:37 PM EDT ----- Surgery Booking Request Patient Full Name:  Sharon Gibson  MRN: [id number]  DOB: 07/08/1981  Surgeon: Natale Milchhristanna R Schuman, MD  Requested Surgery Date and Time: no preference Primary Diagnosis AND Code: left ovarian cyst Secondary Diagnosis and Code:  Surgical Procedure: laparoscopic left ovarian cystectomy, possbile left oophorectomy L&D Notification: No Admission Status: same day surgery Length of Surgery: 2 hour Special Case Needs: none H&P: please schedule (date) Phone Interview???: yes Interpreter: Language:  Medical Clearance: no Special Scheduling Instructions: she does not have insurance right now.

## 2018-04-01 NOTE — Telephone Encounter (Signed)
[facility] is [facility] @ 8:10am w/ Dr. Jerene PitchSchuman, Pre-admit Testing phone interview to be scheduled, and OR on 05/10/18. Patient is aware she may receive [facility] and [facility]. Patient confirms she has Medicaid FPW and is aware it will not pay for the surgery. Patient was given phone# to [facility] [phone]), phone# for financial assistance w/in [facility] practices [phone]), phone# for [employer] [phone]). Patient intends to contact Medicaid to try to qualify for expanded coverage. Patient may also look into getting coverage through [employer]. Phone# and ext given, and patient will call back to let me know if there are any insurance changes so the surgery can be billed out appropriately.

## 2018-05-02 ENCOUNTER — Encounter: Payer: Self-pay | Admitting: Obstetrics and Gynecology

## 2018-05-02 ENCOUNTER — Ambulatory Visit (INDEPENDENT_AMBULATORY_CARE_PROVIDER_SITE_OTHER): Payer: Medicaid Other | Admitting: Obstetrics and Gynecology

## 2018-05-02 VITALS — BP 118/70 | HR 74 | Ht 63.0 in | Wt 249.0 lb

## 2018-05-02 DIAGNOSIS — R102 Pelvic and perineal pain: Secondary | ICD-10-CM

## 2018-05-02 DIAGNOSIS — N83202 Unspecified ovarian cyst, left side: Secondary | ICD-10-CM

## 2018-05-02 DIAGNOSIS — N83209 Unspecified ovarian cyst, unspecified side: Secondary | ICD-10-CM

## 2018-05-02 NOTE — Progress Notes (Signed)
Patient ID: Sharon Gibson, female   DOB: [birth year], 37 y.o.   MRN: [id number]  Reason for Consult: History & physical   Referred by No ref. provider found  Subjective:     HPI:  Sharon CatenaKristen Gibson is a 37 y.o. female she is here today for a preoperative visit prior to her planned surgery for next week to remover a hemorrhagic left ovarian cyst. She has had continued episodic pelvic pain.   Past Medical History:  Diagnosis Date  . Anxiety   . Chicken pox   . Depression   . Eating disorder    Has had residential treatment and hospitalization previously.  Marland Kitchen. History of anemia   . History of pulmonary embolus (PE)   . Hypothyroidism   . PTSD (post-traumatic stress disorder)    Family History  Problem Relation Age of Onset  . Hypertension Mother   . Hypertension Father   . Mental retardation Father   . Diabetes Father   . Anxiety disorder Father   . Prostate cancer Paternal Grandfather    Past Surgical History:  Procedure Laterality Date  . CHOLECYSTECTOMY  2004  . GASTRIC BYPASS      Short Social History:  Social History   Tobacco Use  . Smoking status: Never Smoker  . Smokeless tobacco: Never Used  Substance Use Topics  . Alcohol use: Yes    Alcohol/week: 0.0 - 0.6 oz    Comment: rare    Allergies  Allergen Reactions  . Penicillins Hives    Has patient had a PCN reaction causing immediate rash, facial/tongue/throat swelling, SOB or lightheadedness with hypotension: yes Has patient had a PCN reaction causing severe rash involving mucus membranes or skin necrosis: no Has patient had a PCN reaction that required hospitalization: yes Has patient had a PCN reaction occurring within the last 10 years: no If all of the above answers are "NO", then may proceed with Cephalosporin use.   . Azithromycin   . Other     Narcotics- Burning in the stomach (whether PO or IV)    Current Outpatient Medications  Medication Sig Dispense Refill  . clonazePAM  (KLONOPIN) 0.5 MG tablet Take 1 tablet (0.5 mg total) by mouth 2 (two) times daily as needed for anxiety. 60 tablet 0  . levonorgestrel (MIRENA) 20 MCG/24HR IUD 1 each by Intrauterine route once.    . Multiple Vitamin (MULTIVITAMIN) tablet Take 1 tablet by mouth daily.    . sertraline (ZOLOFT) 100 MG tablet Take 1 tablet (100 mg total) by mouth daily. 90 tablet 0   No current facility-administered medications for this visit.     REVIEW OF SYSTEMS      Objective:  Objective   Vitals:   05/02/18 0816  BP: 118/70  Pulse: 74  Weight: 249 lb (112.9 kg)  Height: 5\' 3"  (1.6 m)   Body mass index is 44.11 kg/m.  Physical Exam  Constitutional: She is oriented to person, place, and time. She appears well-developed and well-nourished.  HENT:  Head: Normocephalic and atraumatic.  Eyes: Pupils are equal, round, and reactive to light. EOM are normal.  Cardiovascular: Normal rate and regular rhythm.  Pulmonary/Chest: Effort normal. No respiratory distress.  Abdominal: Soft. She exhibits no distension.  Neurological: She is alert and oriented to person, place, and time.  Skin: Skin is warm and dry.  Psychiatric: She has a normal mood and affect. Her behavior is normal. Judgment and thought content normal.  Nursing note and vitals reviewed.  Assessment/Plan:     37 yo with a left ovarian endometrioma All questions answered. Risks of the surgery, alternatives to the surgery explained. She understood the risks of injury to major blood vessels, bowel and reproductive organs, She understands the risks of hemorrhage, and infections. She agreed to have the procedure and consents were signed in office.   Adelene Idler MD [facility], [facility] 05/02/18 11:49 AM

## 2018-05-02 NOTE — H&P (View-Only) (Signed)
 Patient ID: Sharon Gibson, female   DOB: 08/04/1981, 37 y.o.   MRN: [id number]  Reason for Consult: History & physical   Referred by No ref. provider found  Subjective:     HPI:  Sharon Gibson is a 37 y.o. female she is here today for a preoperative visit prior to her planned surgery for next week to remover a hemorrhagic left ovarian cyst. She has had continued episodic pelvic pain.   Past Medical History:  Diagnosis Date  . Anxiety   . Chicken pox   . Depression   . Eating disorder    Has had residential treatment and hospitalization previously.  . History of anemia   . History of pulmonary embolus (PE)   . Hypothyroidism   . PTSD (post-traumatic stress disorder)    Family History  Problem Relation Age of Onset  . Hypertension Mother   . Hypertension Father   . Mental retardation Father   . Diabetes Father   . Anxiety disorder Father   . Prostate cancer Paternal Grandfather    Past Surgical History:  Procedure Laterality Date  . CHOLECYSTECTOMY  2004  . GASTRIC BYPASS      Short Social History:  Social History   Tobacco Use  . Smoking status: Never Smoker  . Smokeless tobacco: Never Used  Substance Use Topics  . Alcohol use: Yes    Alcohol/week: 0.0 - 0.6 oz    Comment: rare    Allergies  Allergen Reactions  . Penicillins Hives    Has patient had a PCN reaction causing immediate rash, facial/tongue/throat swelling, SOB or lightheadedness with hypotension: yes Has patient had a PCN reaction causing severe rash involving mucus membranes or skin necrosis: no Has patient had a PCN reaction that required hospitalization: yes Has patient had a PCN reaction occurring within the last 10 years: no If all of the above answers are "NO", then may proceed with Cephalosporin use.   . Azithromycin   . Other     Narcotics- Burning in the stomach (whether PO or IV)    Current Outpatient Medications  Medication Sig Dispense Refill  . clonazePAM  (KLONOPIN) 0.5 MG tablet Take 1 tablet (0.5 mg total) by mouth 2 (two) times daily as needed for anxiety. 60 tablet 0  . levonorgestrel (MIRENA) 20 MCG/24HR IUD 1 each by Intrauterine route once.    . Multiple Vitamin (MULTIVITAMIN) tablet Take 1 tablet by mouth daily.    . sertraline (ZOLOFT) 100 MG tablet Take 1 tablet (100 mg total) by mouth daily. 90 tablet 0   No current facility-administered medications for this visit.     REVIEW OF SYSTEMS      Objective:  Objective   Vitals:   05/02/18 0816  BP: 118/70  Pulse: 74  Weight: 249 lb (112.9 kg)  Height: 5' 3" (1.6 m)   Body mass index is 44.11 kg/m.  Physical Exam  Constitutional: She is oriented to person, place, and time. She appears well-developed and well-nourished.  HENT:  Head: Normocephalic and atraumatic.  Eyes: Pupils are equal, round, and reactive to light. EOM are normal.  Cardiovascular: Normal rate and regular rhythm.  Pulmonary/Chest: Effort normal. No respiratory distress.  Abdominal: Soft. She exhibits no distension.  Neurological: She is alert and oriented to person, place, and time.  Skin: Skin is warm and dry.  Psychiatric: She has a normal mood and affect. Her behavior is normal. Judgment and thought content normal.  Nursing note and vitals reviewed.          Assessment/Plan:     37 yo with a left ovarian endometrioma All questions answered. Risks of the surgery, alternatives to the surgery explained. She understood the risks of injury to major blood vessels, bowel and reproductive organs, She understands the risks of hemorrhage, and infections. She agreed to have the procedure and consents were signed in office.   Tosha Belgarde MD [facility],  [facility] 05/02/18 11:49 AM     

## 2018-05-03 ENCOUNTER — Encounter: Payer: Self-pay | Admitting: *Deleted

## 2018-05-03 ENCOUNTER — Other Ambulatory Visit: Payer: Self-pay

## 2018-05-03 ENCOUNTER — Encounter
Admission: RE | Admit: 2018-05-03 | Discharge: 2018-05-03 | Disposition: A | Discharge status: Home / Self Care | Payer: Medicaid Other | Source: Ambulatory Visit | Attending: Obstetrics and Gynecology | Admitting: Obstetrics and Gynecology

## 2018-05-03 HISTORY — DX: Cardiac murmur, unspecified: R01.1

## 2018-05-03 NOTE — Patient Instructions (Signed)
Your procedure is scheduled on: 05-10-18 TUESDAY Report to Same Day Surgery 2nd floor medical mall [facility] Entrance-take elevator on left to 2nd floor.  Check in with surgery information desk.) To find out your arrival time please call [phone] between 1PM - 3PM on 05-09-18 MONDAY  Remember: Instructions that are not followed completely may result in serious medical risk, up to and including death, or upon the discretion of your surgeon and anesthesiologist your surgery may need to be rescheduled.    _x___ 1. Do not eat food after midnight the night before your procedure. NO GUM OR CANDY AFTER MIDNIGHT.  You may drink clear liquids up to 2 hours before you are scheduled to arrive at the hospital for your procedure.  Do not drink clear liquids within 2 hours of your scheduled arrival to the hospital.  Clear liquids include  --Water or Apple juice without pulp  --Clear carbohydrate beverage such as ClearFast or Gatorade  --Black Coffee or Clear Tea (No milk, no creamers, do not add anything to the coffee or Tea   ____Ensure clear carbohydrate drink on the way to the hospital for bariatric patients  ____Ensure clear carbohydrate drink 3 hours before surgery for Dr Rutherford Nail patients if physician instructed.     __x__ 2. No Alcohol for 24 hours before or after surgery.   __x__3. No Smoking or e-cigarettes for 24 prior to surgery.  Do not use any chewable tobacco products for at least 6 hour prior to surgery   ____  4. Bring all medications with you on the day of surgery if instructed.    __x__ 5. Notify your doctor if there is any change in your medical condition     (cold, fever, infections).    x___6. On the morning of surgery brush your teeth with toothpaste and water.  You may rinse your mouth with mouth wash if you wish.  Do not swallow any toothpaste or mouthwash.   Do not wear jewelry, make-up, hairpins, clips or nail polish.  Do not wear lotions, powders, or perfumes.  You may wear deodorant.  Do not shave 48 hours prior to surgery. Men may shave face and neck.  Do not bring valuables to the hospital.    [facility] is not responsible for any belongings or valuables.               Contacts, dentures or bridgework may not be worn into surgery.  Leave your suitcase in the car. After surgery it may be brought to your room.  For patients admitted to the hospital, discharge time is determined by your treatment team.  _  Patients discharged the day of surgery will not be allowed to drive home.  You will need someone to drive you home and stay with you the night of your procedure.    Please read over the following fact sheets that you were given:   [facility] Preparing for Surgery   _x___ Take anti-hypertensive listed below, cardiac, seizure, asthma, anti-reflux and psychiatric medicines. These include:  1. YOU MAY TAKE YOUR KLONOPIN (CLONAZEPAM) DAY OF SURGERY IF NEEDED WITH A SMALL SIP OF WATER  2.  3.  4.  5.  6.  ____Fleets enema or Magnesium Citrate as directed.   _x___ Use CHG Soap or sage wipes as directed on instruction sheet   ____ Use inhalers on the day of surgery and bring to hospital day of surgery  ____ Stop Metformin and Janumet 2 days prior to surgery.  ____ Take 1/2 of usual insulin dose the night before surgery and none on the morning surgery.   ____ Follow recommendations from Cardiologist, Pulmonologist or PCP regarding stopping Aspirin, Coumadin, Plavix ,Eliquis, Effient, or Pradaxa, and Pletal.  X____Stop Anti-inflammatories such as Advil, Aleve, Ibuprofen, Motrin, Naproxen, Naprosyn, Goodies powders or aspirin products. OK to take Tylenol.   ____ Stop supplements until after surgery.   ____ Bring C-Pap to the hospital.

## 2018-05-03 NOTE — Pre-Procedure Instructions (Signed)
Sharon Gibson, Melissa C, MD  Physician  Hematology  Progress Notes  Signed  Encounter Date:  10/07/2016          Signed            Show:Clear all [x] Manual[x] Template[x] Copied  Added by: [x] Sharon Gibson, Melissa C, MD   [] Hover for details   [facility]-  Cancer Center  Clinic day:  10/07/16  Chief Complaint: Sharon CatenaKristen Gibson is a 37 y.o. female s/p gastric bypass surgery with subsequent B12 and iron deficiency who is seen for 6 month assessment.  HPI:  The patient was last seen in the hematology clinic on 04/08/2016.  At that time, she was seen for initial consultation.  She was fatigued and short of breath.  Labs revealed a hematocrit of 38.7, hemoglobin 12.9, and MCV 83.7.  Ferritin was 8.  Iron saturation was 7% with a TIBC of 412.  Folate was 31.  B12 was 531.    She received Venofer 200 mg IV with premedications (Tylenol, Benadryl, and Decadron) on 05/13/2016.  She contacted clinic regarding side effects of dizziness, lightheadedness, low blood pressure, excess sweating, aches, and pains, nausea, and headache.  Labs on 07/08/2016 revealed a hematocrit of 38.8, hemoglobin 12.9, and MCV 84.1.  Ferritin was 22.  Symptomatically, she has felt more fatigued.  B21 injections stopped around last time.  She has not had sleep apnea testing.         Past Medical History:  Diagnosis Date  . Anxiety   . Chicken pox   . Depression   . Eating disorder    Has had residential treatment and hospitalization previously.  Marland Kitchen. History of anemia   . History of pulmonary embolus (PE)   . Hypothyroidism   . PTSD (post-traumatic stress disorder)          Past Surgical History:  Procedure Laterality Date  . CHOLECYSTECTOMY  2004  . GASTRIC BYPASS           Family History  Problem Relation Age of Onset  . Hypertension Mother   . Hypertension Father   . Mental retardation Father   . Diabetes Father   . Anxiety disorder Father    . Prostate cancer Paternal Grandfather     Social History:  reports that she has never smoked. She has never used smokeless tobacco. She reports that she does not drink alcohol or use drugs.  [place] then [place] ([place] school[place]  She lives in [place], [place].  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  The patient is alone today.  Allergies:       Allergies  Allergen Reactions  . Penicillins Hives  . Azithromycin   . Other     Narcotics- Burning in the stomach (whether PO or IV)    Current Medications:       Current Outpatient Prescriptions  Medication Sig Dispense Refill  . clonazePAM (KLONOPIN) 1 MG tablet Take 1 tablet (1 mg total) by mouth at bedtime. 30 tablet 2  . gabapentin (NEURONTIN) 300 MG capsule Take 1 capsule (300 mg total) by mouth 3 (three) times daily. 90 capsule 2  . sertraline (ZOLOFT) 100 MG tablet Take 3 tablets (300 mg total) by mouth daily. 90 tablet 2  . Vitamin D, Ergocalciferol, (DRISDOL) 50000 units CAPS capsule Take 1 capsule (50,000 Units total) by mouth every 7 (seven) days. 12 capsule 0  . diphenhydrAMINE (BENADRYL) 50 MG tablet Take 50 mg by  mouth at bedtime as needed.     No current facility-administered medications for this visit.     Review of Systems:  GENERAL:  Fatigue.  No fevers or sweats.  Refuses weight check. PERFORMANCE STATUS (ECOG):  1 HEENT:  No visual changes, runny nose, sore throat, mouth sores or tenderness. Lungs: No shortness of breath or cough.  No hemoptysis. Cardiac:  No chest pain, palpitations, orthopnea, or PND. GI:  No nausea, vomiting, diarrhea, constipation, melena or hematochezia. GU:  No urgency, frequency, dysuria, or hematuria.  Heavy menses prior to IUD. Musculoskeletal:  No back pain.  No joint pain.  No muscle tenderness. Extremities:  No pain or swelling. Skin:  Bruising on legs.  No rashes or skin changes. Neuro:  Poor  sleep, on Trazodone.  Word finding issues and trouble focusing felt secondary to iron deficiency.  No headache, numbness or weakness, balance or coordination issues. Endocrine:  No diabetes, thyroid issues, hot flashes or night sweats. Psych:  No mood changes, depression or anxiety. Pain:  No focal pain. Review of systems:  All other systems reviewed and found to be negative.  Physical Exam: Blood pressure 125/79, pulse 65, temperature 98.1 F (36.7 C), temperature source Tympanic, resp. rate 18. GENERAL:  Well developed, well nourished, heavyset woman sitting comfortably in the exam room in no acute distress. MENTAL STATUS:  Alert and oriented to person, place and time. HEAD:  Red hair pulled up.  Normocephalic, atraumatic, face symmetric, no Cushingoid features. EYES:  Glasses.  Blue eyes.  Pupils equal round and reactive to light and accomodation.  No conjunctivitis or scleral icterus. ENT:  Oropharynx clear without lesion.  Tongue normal. Mucous membranes moist.  RESPIRATORY:  Clear to auscultation without rales, wheezes or rhonchi. CARDIOVASCULAR:  Regular rate and rhythm without murmur, rub or gallop. ABDOMEN:  Fully round.  Soft, non-tender, with active bowel sounds, and no appreciable hepatosplenomegaly.  No masses. SKIN:  No petechiae.  No rashes, ulcers or lesions. EXTREMITIES:  Chronic lower extremity changes.  No skin discoloration or tenderness.  No palpable cords. LYMPH NODES: No palpable cervical, supraclavicular, axillary or inguinal adenopathy  NEUROLOGICAL: Unremarkable. PSYCH:  Appropriate.   Appointment on 10/07/2016  Component Date Value Ref Range Status  . WBC 10/07/2016 7.8  3.6 - 11.0 K/uL Final  . RBC 10/07/2016 4.96  3.80 - 5.20 MIL/uL Final  . Hemoglobin 10/07/2016 13.6  12.0 - 16.0 g/dL Final  . HCT 16/06/9603 42.3  35.0 - 47.0 % Final  . MCV 10/07/2016 85.3  80.0 - 100.0 fL Final  . MCH 10/07/2016 27.5  26.0 - 34.0 pg Final  . MCHC 10/07/2016 32.3  32.0  - 36.0 g/dL Final  . RDW 54/05/8118 14.1  11.5 - 14.5 % Final  . Platelets 10/07/2016 288  150 - 440 K/uL Final  . Neutrophils Relative % 10/07/2016 67  % Final  . Neutro Abs 10/07/2016 5.2  1.4 - 6.5 K/uL Final  . Lymphocytes Relative 10/07/2016 23  % Final  . Lymphs Abs 10/07/2016 1.8  1.0 - 3.6 K/uL Final  . Monocytes Relative 10/07/2016 7  % Final  . Monocytes Absolute 10/07/2016 0.6  0.2 - 0.9 K/uL Final  . Eosinophils Relative 10/07/2016 2  % Final  . Eosinophils Absolute 10/07/2016 0.2  0 - 0.7 K/uL Final  . Basophils Relative 10/07/2016 1  % Final  . Basophils Absolute 10/07/2016 0.1  0 - 0.1 K/uL Final    Assessment:  Sabena Winner is a 37 y.o.  female with B12 and iron deficiency anemia s/p gastric bypass surgery (2004).  She has received IV iron (different preparations) x 5 ([place], [place]; [place], [place];  [place], [place]) with reactions requiring Benadryl and steroids.  Labs on 03/20/2016 revealed a normal hematocrit but iron deficiency (ferritin 8).  B12 was 185 (low).  She began B12 on 04/07/2016.  She received Venofer 200 mg IV with premedications (Tylenol, Benadryl, and Decadron) on 05/13/2016.  She had side effects of dizziness, lightheadedness, low blood pressure, excess sweating, aches, and pains, nausea, and headache after her infusion.  She has a history of multiple pulmonary emboli in 2003.  Per patient report, hypercoagulable work-up was negative.  She was on Coumadin x 6 months.  She has a history of easy bruising and heavy menses.  She has an IUD.  Symptomatically, she is fatigued.  She does not feel refreshed when she wakes up in the morning (? sleep apnea).  Exam is stable.  Hematocrit is 42.3.  Plan: 1.  Labs today:  CBC with diff, ferritin, iron studies. 2.  Nurse to call patient with today's lab results. 3.  Preauth B12. 4.  RTC monthly for B12 once preauth obtained. 5.  RTC in 3 months for labs (CBC with diff, ferritin). 6.  RTC in 6 months for MD  assessment and labs (CBC with diff, ferritin, iron studies).   Sharon Bath, MD  10/07/2016, 9:35 AM           Electronically signed by Sharon Bath, MD at 11/29/2016 7:56 AM     Office Visit on 10/07/2016        Detailed Report

## 2018-05-05 ENCOUNTER — Encounter
Admission: RE | Admit: 2018-05-05 | Discharge: 2018-05-05 | Disposition: A | Discharge status: Home / Self Care | Payer: Medicaid Other | Source: Ambulatory Visit | Attending: Obstetrics and Gynecology | Admitting: Obstetrics and Gynecology

## 2018-05-05 DIAGNOSIS — Z01812 Encounter for preprocedural laboratory examination: Secondary | ICD-10-CM | POA: Insufficient documentation

## 2018-05-05 LAB — CBC
HEMATOCRIT: 40.5 % (ref 35.0–47.0)
HEMOGLOBIN: 13.3 g/dL (ref 12.0–16.0)
MCH: 28.1 pg (ref 26.0–34.0)
MCHC: 33 g/dL (ref 32.0–36.0)
MCV: 85.2 fL (ref 80.0–100.0)
Platelets: 276 10*3/uL (ref 150–440)
RBC: 4.75 MIL/uL (ref 3.80–5.20)
RDW: 14 % (ref 11.5–14.5)
WBC: 7.9 10*3/uL (ref 3.6–11.0)

## 2018-05-05 LAB — TYPE AND SCREEN
ABO/RH(D): A POS
ANTIBODY SCREEN: NEGATIVE

## 2018-05-10 ENCOUNTER — Ambulatory Visit: Payer: Self-pay | Admitting: Anesthesiology

## 2018-05-10 ENCOUNTER — Encounter
Admission: RE | Disposition: A | Discharge status: Home / Self Care | Payer: Self-pay | Source: Ambulatory Visit | Attending: Obstetrics and Gynecology

## 2018-05-10 ENCOUNTER — Ambulatory Visit
Admission: RE | Admit: 2018-05-10 | Discharge: 2018-05-10 | Disposition: A | Discharge status: Home / Self Care | Payer: Self-pay | Source: Ambulatory Visit | Attending: Obstetrics and Gynecology | Admitting: Obstetrics and Gynecology

## 2018-05-10 ENCOUNTER — Encounter: Payer: Self-pay | Admitting: Emergency Medicine

## 2018-05-10 ENCOUNTER — Other Ambulatory Visit: Payer: Self-pay

## 2018-05-10 DIAGNOSIS — Z86711 Personal history of pulmonary embolism: Secondary | ICD-10-CM | POA: Insufficient documentation

## 2018-05-10 DIAGNOSIS — Z885 Allergy status to narcotic agent status: Secondary | ICD-10-CM | POA: Insufficient documentation

## 2018-05-10 DIAGNOSIS — Z79899 Other long term (current) drug therapy: Secondary | ICD-10-CM | POA: Insufficient documentation

## 2018-05-10 DIAGNOSIS — F4310 Post-traumatic stress disorder, unspecified: Secondary | ICD-10-CM | POA: Insufficient documentation

## 2018-05-10 DIAGNOSIS — E669 Obesity, unspecified: Secondary | ICD-10-CM | POA: Insufficient documentation

## 2018-05-10 DIAGNOSIS — F329 Major depressive disorder, single episode, unspecified: Secondary | ICD-10-CM | POA: Insufficient documentation

## 2018-05-10 DIAGNOSIS — Z880 Allergy status to penicillin: Secondary | ICD-10-CM | POA: Insufficient documentation

## 2018-05-10 DIAGNOSIS — N801 Endometriosis of ovary: Secondary | ICD-10-CM

## 2018-05-10 DIAGNOSIS — Z9884 Bariatric surgery status: Secondary | ICD-10-CM | POA: Insufficient documentation

## 2018-05-10 DIAGNOSIS — Z6841 Body Mass Index (BMI) 40.0 and over, adult: Secondary | ICD-10-CM | POA: Insufficient documentation

## 2018-05-10 DIAGNOSIS — Z881 Allergy status to other antibiotic agents status: Secondary | ICD-10-CM | POA: Insufficient documentation

## 2018-05-10 DIAGNOSIS — N803 Endometriosis of pelvic peritoneum: Secondary | ICD-10-CM | POA: Insufficient documentation

## 2018-05-10 HISTORY — PX: LAPAROSCOPIC OVARIAN CYSTECTOMY: SHX6248

## 2018-05-10 LAB — ABO/RH: ABO/RH(D): A POS

## 2018-05-10 LAB — POCT PREGNANCY, URINE: Preg Test, Ur: NEGATIVE

## 2018-05-10 SURGERY — EXCISION, CYST, OVARY, LAPAROSCOPIC
Anesthesia: General | Laterality: Left

## 2018-05-10 MED ORDER — FENTANYL CITRATE (PF) 100 MCG/2ML IJ SOLN
INTRAMUSCULAR | Status: AC
  Filled 2018-05-10: qty 2

## 2018-05-10 MED ORDER — BUPIVACAINE LIPOSOME 1.3 % IJ SUSP
INTRAMUSCULAR | Status: AC
  Filled 2018-05-10: qty 20

## 2018-05-10 MED ORDER — LACTATED RINGERS IV SOLN
INTRAVENOUS | Status: DC
  Administered 2018-05-10 (×2): via INTRAVENOUS

## 2018-05-10 MED ORDER — KETOROLAC TROMETHAMINE 30 MG/ML IJ SOLN
INTRAMUSCULAR | Status: DC | PRN
  Administered 2018-05-10: 30 mg via INTRAVENOUS

## 2018-05-10 MED ORDER — ONDANSETRON HCL 4 MG/2ML IJ SOLN
INTRAMUSCULAR | Status: AC
  Filled 2018-05-10: qty 2

## 2018-05-10 MED ORDER — DEXAMETHASONE SODIUM PHOSPHATE 10 MG/ML IJ SOLN
INTRAMUSCULAR | Status: AC
  Filled 2018-05-10: qty 1

## 2018-05-10 MED ORDER — OXYCODONE HCL 5 MG/5ML PO SOLN: 5.0000 mg | Freq: Once | ORAL | Status: DC | PRN

## 2018-05-10 MED ORDER — LIDOCAINE HCL (PF) 1 % IJ SOLN
INTRAMUSCULAR | Status: DC | PRN
  Administered 2018-05-10: 10 mL

## 2018-05-10 MED ORDER — ONDANSETRON HCL 4 MG/2ML IJ SOLN
4.0000 mg | Freq: Once | INTRAMUSCULAR | Status: AC | PRN
  Administered 2018-05-10: 4 mg via INTRAVENOUS

## 2018-05-10 MED ORDER — MEPERIDINE HCL 50 MG/ML IJ SOLN: 6.2500 mg | INTRAMUSCULAR | Status: DC | PRN

## 2018-05-10 MED ORDER — DEXAMETHASONE SODIUM PHOSPHATE 10 MG/ML IJ SOLN
INTRAMUSCULAR | Status: DC | PRN
  Administered 2018-05-10: 10 mg via INTRAVENOUS

## 2018-05-10 MED ORDER — LACTATED RINGERS IV SOLN: INTRAVENOUS | Status: DC

## 2018-05-10 MED ORDER — ONDANSETRON HCL 4 MG/2ML IJ SOLN
INTRAMUSCULAR | Status: AC
  Administered 2018-05-10: 4 mg via INTRAVENOUS
  Filled 2018-05-10: qty 2

## 2018-05-10 MED ORDER — LIDOCAINE HCL (CARDIAC) PF 100 MG/5ML IV SOSY
PREFILLED_SYRINGE | INTRAVENOUS | Status: DC | PRN
  Administered 2018-05-10: 40 mg via INTRAVENOUS

## 2018-05-10 MED ORDER — SODIUM CHLORIDE FLUSH 0.9 % IV SOLN
INTRAVENOUS | Status: AC
  Filled 2018-05-10: qty 10

## 2018-05-10 MED ORDER — BUPIVACAINE LIPOSOME 1.3 % IJ SUSP
20.0000 mL | Freq: Once | INTRAMUSCULAR | Status: DC
  Filled 2018-05-10: qty 20

## 2018-05-10 MED ORDER — MIDAZOLAM HCL 2 MG/2ML IJ SOLN
INTRAMUSCULAR | Status: AC
  Filled 2018-05-10: qty 2

## 2018-05-10 MED ORDER — EPHEDRINE SULFATE 50 MG/ML IJ SOLN
INTRAMUSCULAR | Status: DC | PRN
  Administered 2018-05-10: 5 mg via INTRAVENOUS

## 2018-05-10 MED ORDER — OXYCODONE HCL 5 MG PO TABS: 5.0000 mg | ORAL_TABLET | Freq: Once | ORAL | Status: DC | PRN

## 2018-05-10 MED ORDER — FENTANYL CITRATE (PF) 100 MCG/2ML IJ SOLN: 25.0000 ug | INTRAMUSCULAR | Status: DC | PRN

## 2018-05-10 MED ORDER — PROPOFOL 10 MG/ML IV BOLUS
INTRAVENOUS | Status: DC | PRN
  Administered 2018-05-10: 150 mg via INTRAVENOUS

## 2018-05-10 MED ORDER — ROCURONIUM BROMIDE 100 MG/10ML IV SOLN
INTRAVENOUS | Status: AC
  Filled 2018-05-10: qty 1

## 2018-05-10 MED ORDER — GABAPENTIN 300 MG PO CAPS: 300.0000 mg | ORAL_CAPSULE | Freq: Three times a day (TID) | ORAL | 0 refills | Status: DC

## 2018-05-10 MED ORDER — MIDAZOLAM HCL 2 MG/2ML IJ SOLN
INTRAMUSCULAR | Status: DC | PRN
  Administered 2018-05-10: 2 mg via INTRAVENOUS

## 2018-05-10 MED ORDER — FENTANYL CITRATE (PF) 100 MCG/2ML IJ SOLN
INTRAMUSCULAR | Status: DC | PRN
  Administered 2018-05-10: 25 ug via INTRAVENOUS
  Administered 2018-05-10 (×2): 50 ug via INTRAVENOUS
  Administered 2018-05-10: 25 ug via INTRAVENOUS
  Administered 2018-05-10: 50 ug via INTRAVENOUS

## 2018-05-10 MED ORDER — ACETAMINOPHEN 500 MG PO TABS
ORAL_TABLET | ORAL | Status: AC
  Administered 2018-05-10: 1000 mg via ORAL
  Filled 2018-05-10: qty 2

## 2018-05-10 MED ORDER — HEPARIN SODIUM (PORCINE) 5000 UNIT/ML IJ SOLN
5000.0000 [IU] | INTRAMUSCULAR | Status: AC
  Administered 2018-05-10: 5000 [IU] via SUBCUTANEOUS

## 2018-05-10 MED ORDER — ACETAMINOPHEN 500 MG PO TABS
1000.0000 mg | ORAL_TABLET | Freq: Once | ORAL | Status: AC
  Administered 2018-05-10: 1000 mg via ORAL

## 2018-05-10 MED ORDER — BUPIVACAINE LIPOSOME 1.3 % IJ SUSP
INTRAMUSCULAR | Status: DC | PRN
  Administered 2018-05-10: 20 mL

## 2018-05-10 MED ORDER — SUGAMMADEX SODIUM 500 MG/5ML IV SOLN
INTRAVENOUS | Status: DC | PRN
  Administered 2018-05-10: 200 mg via INTRAVENOUS

## 2018-05-10 MED ORDER — GABAPENTIN 300 MG PO CAPS
300.0000 mg | ORAL_CAPSULE | Freq: Once | ORAL | Status: AC
  Administered 2018-05-10: 300 mg via ORAL

## 2018-05-10 MED ORDER — ROCURONIUM BROMIDE 100 MG/10ML IV SOLN
INTRAVENOUS | Status: DC | PRN
  Administered 2018-05-10: 20 mg via INTRAVENOUS
  Administered 2018-05-10: 40 mg via INTRAVENOUS
  Administered 2018-05-10: 10 mg via INTRAVENOUS
  Administered 2018-05-10: 20 mg via INTRAVENOUS

## 2018-05-10 MED ORDER — LIDOCAINE HCL (PF) 1 % IJ SOLN
INTRAMUSCULAR | Status: AC
  Filled 2018-05-10: qty 30

## 2018-05-10 MED ORDER — PROMETHAZINE HCL 25 MG/ML IJ SOLN
INTRAMUSCULAR | Status: AC
  Administered 2018-05-10: 12.5 mg via INTRAVENOUS
  Filled 2018-05-10: qty 1

## 2018-05-10 MED ORDER — TRAMADOL HCL 50 MG PO TABS: 50.0000 mg | ORAL_TABLET | Freq: Four times a day (QID) | ORAL | 0 refills | Status: DC | PRN

## 2018-05-10 MED ORDER — ONDANSETRON HCL 4 MG/2ML IJ SOLN
INTRAMUSCULAR | Status: DC | PRN
  Administered 2018-05-10: 4 mg via INTRAVENOUS

## 2018-05-10 MED ORDER — SUGAMMADEX SODIUM 200 MG/2ML IV SOLN
INTRAVENOUS | Status: AC
  Filled 2018-05-10: qty 2

## 2018-05-10 MED ORDER — PROMETHAZINE HCL 25 MG/ML IJ SOLN
12.5000 mg | Freq: Once | INTRAMUSCULAR | Status: AC
  Administered 2018-05-10: 12.5 mg via INTRAVENOUS

## 2018-05-10 MED ORDER — PROPOFOL 10 MG/ML IV BOLUS
INTRAVENOUS | Status: AC
  Filled 2018-05-10: qty 20

## 2018-05-10 MED ORDER — GABAPENTIN 300 MG PO CAPS
ORAL_CAPSULE | ORAL | Status: AC
  Administered 2018-05-10: 300 mg via ORAL
  Filled 2018-05-10: qty 1

## 2018-05-10 MED ORDER — HEPARIN SODIUM (PORCINE) 5000 UNIT/ML IJ SOLN
INTRAMUSCULAR | Status: AC
  Administered 2018-05-10: 5000 [IU] via SUBCUTANEOUS
  Filled 2018-05-10: qty 1

## 2018-05-10 MED ORDER — EPHEDRINE SULFATE 50 MG/ML IJ SOLN
INTRAMUSCULAR | Status: AC
  Filled 2018-05-10: qty 1

## 2018-05-10 MED ORDER — SEVOFLURANE IN SOLN
RESPIRATORY_TRACT | Status: AC
  Filled 2018-05-10: qty 250

## 2018-05-10 SURGICAL SUPPLY — 35 items
ANCHOR TIS RET SYS 235ML (MISCELLANEOUS) IMPLANT
BLADE SURG SZ11 CARB STEEL (BLADE) ×2 IMPLANT
CANISTER SUCT 1200ML W/VALVE (MISCELLANEOUS) ×2 IMPLANT
CATH ROBINSON RED A/P 16FR (CATHETERS) IMPLANT
CHLORAPREP W/TINT 26ML (MISCELLANEOUS) ×2 IMPLANT
DERMABOND ADVANCED (GAUZE/BANDAGES/DRESSINGS) ×1
DERMABOND ADVANCED .7 DNX12 (GAUZE/BANDAGES/DRESSINGS) ×1 IMPLANT
DRSG TEGADERM 2-3/8X2-3/4 SM (GAUZE/BANDAGES/DRESSINGS) IMPLANT
GLOVE BIOGEL PI IND STRL 6.5 (GLOVE) ×1 IMPLANT
GLOVE BIOGEL PI INDICATOR 6.5 (GLOVE) ×1
GLOVE SURG SYN 6.5 ES PF (GLOVE) ×2 IMPLANT
GOWN STRL REUS W/ TWL LRG LVL3 (GOWN DISPOSABLE) ×2 IMPLANT
GOWN STRL REUS W/TWL LRG LVL3 (GOWN DISPOSABLE) ×2
GRASPER SUT TROCAR 14GX15 (MISCELLANEOUS) ×2 IMPLANT
IRRIGATION STRYKERFLOW (MISCELLANEOUS) ×1 IMPLANT
IRRIGATOR STRYKERFLOW (MISCELLANEOUS) ×2
IV LACTATED RINGERS 1000ML (IV SOLUTION) ×2 IMPLANT
KIT PINK PAD W/HEAD ARE REST (MISCELLANEOUS) ×2
KIT PINK PAD W/HEAD ARM REST (MISCELLANEOUS) ×1 IMPLANT
LABEL OR SOLS (LABEL) ×2 IMPLANT
NS IRRIG 500ML POUR BTL (IV SOLUTION) ×2 IMPLANT
PACK GYN LAPAROSCOPIC (MISCELLANEOUS) ×2 IMPLANT
PAD OB MATERNITY 4.3X12.25 (PERSONAL CARE ITEMS) ×2 IMPLANT
PAD PREP 24X41 OB/GYN DISP (PERSONAL CARE ITEMS) ×2 IMPLANT
SCISSORS METZENBAUM CVD 33 (INSTRUMENTS) IMPLANT
SHEARS HARMONIC ACE PLUS 36CM (ENDOMECHANICALS) ×2 IMPLANT
SLEEVE ENDOPATH XCEL 5M (ENDOMECHANICALS) IMPLANT
SPONGE GAUZE 2X2 8PLY STRL LF (GAUZE/BANDAGES/DRESSINGS) IMPLANT
SUT VIC AB 2-0 UR6 27 (SUTURE) ×2 IMPLANT
SUT VIC AB 4-0 PS2 18 (SUTURE) ×4 IMPLANT
SYR 10ML LL (SYRINGE) ×4 IMPLANT
TROCAR 5M 150ML BLDLS (TROCAR) ×8 IMPLANT
TROCAR ENDO BLADELESS 11MM (ENDOMECHANICALS) IMPLANT
TROCAR XCEL NON-BLD 5MMX100MML (ENDOMECHANICALS) IMPLANT
TUBING INSUFFLATION (TUBING) ×2 IMPLANT

## 2018-05-10 NOTE — Op Note (Signed)
  Operative Note   05/10/2018  PRE-OP DIAGNOSIS:  1. Left ovarian endometrioma  POST-OP DIAGNOSIS: 1. Left ovarian endometrioma  2.Endometriosis     PROCEDURE: Procedure(s): LAPAROSCOPIC LEFT OVARIAN CYSTECTOMY   SURGEON: Sharon Schuman MD  ANESTHESIA: Choice   ESTIMATED BLOOD LOSS: 25 cc  COMPLICATIONS: none  DISPOSITION: PACU - hemodynamically stable.  CONDITION: stable  FINDINGS: Laparoscopic survey of the abdomen revealed a grossly normal uterus, tubes, ovaries, liver edge.  Intra-abdominal adhesions were noted. In the midline where she had had her gastric bypass.  There was scarring and endometriosis implants in the cul-de-sac  PROCEDURE IN DETAIL: The patient was taken to the OR where anesthesia was administed. The patient was positioned in dorsal lithotomy in the [place] stirrups. The patient was then examined under anesthesia with the above noted findings. The patient was prepped and draped in the normal sterile fashion and bladder was drained using a foley catheter. Speculum exam normal, and a hulka manipulator and sponge stick was placed for manipulation purposes.  Attention was turned to the patient's abdomen where a 5 mm skin incision was made at palmer's point. The skin was elevated and the 5 mm  trocar was inserted under direct visualization. Pneumoperitoneum was obtained.  The above noted findings.  Trendelenburg positioning.  Two 5 mm trocar was then placed in the left lower quadrant and the umbilicus under direct visualization with the laparoscope.  Right and left fallopian tubes are identified and followed out to their fimbria. The left ovary and ovarian cyst were identified. With attempts to manipulate the ovary the cyst ruptured. The suction irrigator was used to clear the old blood from the abdomen. The Harmonic scapel was used to create a plane of dissection so that the ovarian cyst wall could be removed. The cyst wall was peeled from the normal ovarian tissue  until the entire cyst wall was able to be removed. The ovary was hemostatic after the removal of the cyst wall. The cyst was removed through the 5mm ports. The pelvis was irrigated with fluid. No active bleeding was noted.   No injuries or bleeding was noted.  All instruments and ports were then removed from the abdomen after gas was expelled and patient was leveled.   The skin was closed with 4-0 vicryl and skin adhesive. The patient tolerated the procedure well. All counts were correct x 2. The patient was transferred to the recovery room awake, alert and breathing independently.  Adelene Idlerhristanna Schuman MD [facility], [facility] 05/10/18 5:30 PM

## 2018-05-10 NOTE — Transfer of Care (Signed)
Immediate Anesthesia Transfer of Care Note  Patient: Sharon CatenaKristen [place]  Procedure(s) Performed: LAPAROSCOPIC OVARIAN CYSTECTOMY (Left )  Patient Location: PACU  Anesthesia Type:General  Level of Consciousness: awake and alert   Airway & Oxygen Therapy: Patient Spontanous Breathing and Patient connected to face mask oxygen  Post-op Assessment: Report given to RN and Post -op Vital signs reviewed and stable  Post vital signs: Reviewed and stable  Last Vitals:  Vitals Value Taken Time  BP 123/59 05/10/2018  5:21 PM  Temp 36.4 C 05/10/2018  5:21 PM  Pulse 82 05/10/2018  5:25 PM  Resp 13 05/10/2018  5:25 PM  SpO2 100 % 05/10/2018  5:25 PM  Vitals shown include unvalidated device data.  Last Pain:  Vitals:   05/10/18 1721  TempSrc:   PainSc: Asleep         Complications: No apparent anesthesia complications

## 2018-05-10 NOTE — Anesthesia Post-op Follow-up Note (Signed)
Anesthesia QCDR form completed.        

## 2018-05-10 NOTE — Discharge Instructions (Signed)
Laparoscopic Surgery, Care After Refer to this sheet in the next few weeks. These instructions provide you with information about caring for yourself after your procedure. Your health care provider may also give you more specific instructions. Your treatment has been planned according to current medical practices, but problems sometimes occur. Call your health care provider if you have any problems or questions after your procedure. What can I expect after the procedure? After your procedure, it is common to have: Endometriosis Endometriosis is a condition in which the tissue that lines the uterus (endometrium) grows outside of its normal location. The tissue may grow in many locations close to the uterus, but it commonly grows on the ovaries, fallopian tubes, vagina, or bowel. When the uterus sheds the endometrium every menstrual cycle, there is bleeding wherever the endometrial tissue is located. This can cause pain because blood is irritating to tissues that are not normally exposed to it. What are the causes? The cause of endometriosis is not known. What increases the risk? You may be more likely to develop endometriosis if you: Have a family history of endometriosis. Have never given birth. Started your period at age 33 or younger. Have high levels of estrogen in your body. Were exposed to a certain medicine (diethylstilbestrol) before you were born (in utero). Had low birth weight. Were born as a twin, triplet, or other multiple. Have a BMI of less than 25. BMI is an estimate of body fat and is calculated from height and weight.  What are the signs or symptoms? Often, there are no symptoms of this condition. If you do have symptoms, they may: Vary depending on where your endometrial tissue is growing. Occur during your menstrual period (most common) or midcycle. Come and go, or you may go months with no symptoms at all. Stop with menopause.  Symptoms may include: Pain in the back or  abdomen. Heavier bleeding during periods. Pain during sex. Painful bowel movements. Infertility. Pelvic pain. Bleeding more than once a month.  How is this diagnosed? This condition is diagnosed based on your symptoms and a physical exam. You may have tests, such as: Blood tests and urine tests. These may be done to help rule out other possible causes of your symptoms. Ultrasound, to look for abnormal tissues. An X-ray of the lower bowel (barium enema). An ultrasound that is done through the vagina (transvaginally). CT scan. MRI. Laparoscopy. In this procedure, a lighted, pencil-sized instrument called a laparoscope is inserted into your abdomen through an incision. The laparoscope allows your health care provider to look at the organs inside your body and check for abnormal tissue to confirm the diagnosis. If abnormal tissue is found, your health care provider may remove a small piece of tissue (biopsy) to be examined under a microscope.  How is this treated? Treatment for this condition may include: Medicines to relieve pain, such as NSAIDs. Hormone therapy. This involves using artificial (synthetic) hormones to reduce endometrial tissue growth. Your health care provider may recommend using a hormonal form of birth control, or other medicines. Surgery. This may be done to remove abnormal endometrial tissue. In some cases, tissue may be removed using a laparoscope and a laser (laparoscopic laser treatment). In severe cases, surgery may be done to remove the fallopian tubes, uterus, and ovaries (hysterectomy).  Follow these instructions at home: Take over-the-counter and prescription medicines only as told by your health care provider. Do not drive or use heavy machinery while taking prescription pain medicine. Try to avoid  activities that cause pain, including sexual activity. Keep all follow-up visits as told by your health care provider. This is important. Contact a health care  provider if: You have pain in the area between your hip bones (pelvic area) that occurs: Before, during, or after your period. In between your period and gets worse during your period. During or after sex. With bowel movements or urination, especially during your period. You have problems getting pregnant. You have a fever. Get help right away if: You have severe pain that does not get better with medicine. You have severe nausea and vomiting, or you cannot eat without vomiting. You have pain that affects only the lower, right side of your abdomen. You have abdominal pain that gets worse. You have abdominal swelling. You have blood in your stool. This information is not intended to replace advice given to you by your health care provider. Make sure you discuss any questions you have with your health care provider. Document Released: 09/11/2000 Document Revised: 06/19/2016 Document Reviewed: 02/15/2016 Elsevier Interactive Patient Education  2018 [employer]   A small amount of blood or clear fluid coming from the cuts made during surgery (incisions).  Mild cramping in your lower abdomen.  Follow these instructions at home: Incision care   Follow instructions from your health care provider about how to take care of your incisions. Make sure you: ? Wash your hands with soap and water before you change your bandage (dressing). If soap and water are not available, use hand sanitizer. ? Change your dressing as told by your health care provider. ? Leave stitches (sutures), skin glue, or adhesive strips in place. These skin closures may need to be in place for 2 weeks or longer. If adhesive strip edges start to loosen and curl up, you may trim the loose edges. Do not remove adhesive strips completely unless your health care provider tells you to do that.  Check your incision areas every day for signs of infection. Check for: ? More redness, swelling, or pain. ? More fluid or  blood. ? Warmth. ? Pus or a bad smell.  Keep your incision areas clean and dry. Do not take baths, swim, or use a hot tub until your health care provider approves. General instructions  Take over-the-counter and prescription medicines only as told by your health care provider.  Return to your normal activities as told by your health care provider. Ask your health care provider what activities are safe for you. Ask about: ? Returning to work and exercise. ? If you have any weight restrictions. ? When it is okay to resume sexual activity.  Drink enough fluid to keep your urine clear or pale yellow.  Keep all follow-up visits as told by your health care provider. This is important. Contact a health care provider if:  You have unusual bleeding or discharge from your vagina.  You have pain in your abdomen that gets worse or does not get better with medicine.  You feel nauseous.  You havemore redness, swelling, or pain at the site of your incisions.  You have more fluid or blood coming from your incisions.  Your incisions feel warm to the touch.  Your have pus or a bad smell coming from your incisions. Get help right away if:  You have a fever.  You have a lot of unusual bleeding or discharge from your vagina.  You have severe pain in your abdomen.  You cannot stop vomiting.  You have nausea that does  not get better. This information is not intended to replace advice given to you by your health care provider. Make sure you discuss any questions you have with your health care provider. Document Released: 09/03/2011 Document Revised: 05/13/2016 Document Reviewed: 08/26/2015 Elsevier Interactive Patient Education  [employer]2018 Elsevier Inc.

## 2018-05-10 NOTE — Progress Notes (Signed)
Pt having 4/10 pain. Pt states unable to tolerate narcotics due to nausea. Pt requested Tylenol. Dr. Randa NgoPiscitello notified. Acknowledged. Orders received. Beryle Zeitz E 6:05 PM 05/10/2018

## 2018-05-10 NOTE — Anesthesia Postprocedure Evaluation (Signed)
Anesthesia Post Note  Patient: Sharon CatenaKristen Gibson  Procedure(s) Performed: LAPAROSCOPIC OVARIAN CYSTECTOMY (Left )  Patient location during evaluation: PACU Anesthesia Type: General Level of consciousness: awake and alert Pain management: pain level controlled Vital Signs Assessment: post-procedure vital signs reviewed and stable Respiratory status: spontaneous breathing, nonlabored ventilation, respiratory function stable and patient connected to nasal cannula oxygen Cardiovascular status: blood pressure returned to baseline and stable Postop Assessment: no apparent nausea or vomiting Anesthetic complications: no     Last Vitals:  Vitals:   05/10/18 1806 05/10/18 1822  BP: 100/64 100/64  Pulse: (!) 59 (!) 59  Resp: 16 16  Temp: 36.9 C 36.9 C  SpO2: 100% 100%    Last Pain:  Vitals:   05/10/18 1822  TempSrc: Oral  PainSc: 4                  Cleda MccreedyJoseph K Tome Wilson

## 2018-05-10 NOTE — Anesthesia Preprocedure Evaluation (Signed)
Anesthesia Evaluation  Patient identified by MRN, date of birth, ID band Patient awake    Reviewed: Allergy & Precautions, NPO status , Patient's Chart, lab work & pertinent test results  History of Anesthesia Complications Negative for: history of anesthetic complications  Airway Mallampati: II  TM Distance: >3 FB Neck ROM: Full    Dental no notable dental hx.    Pulmonary neg pulmonary ROS, neg sleep apnea, neg COPD,    breath sounds clear to auscultation- rhonchi (-) wheezing      Cardiovascular Exercise Tolerance: Good (-) hypertension(-) CAD, (-) Past MI, (-) Cardiac Stents and (-) CABG  Rhythm:Regular Rate:Normal - Systolic murmurs and - Diastolic murmurs    Neuro/Psych PSYCHIATRIC DISORDERS Anxiety Depression negative neurological ROS     GI/Hepatic negative GI ROS, Neg liver ROS,   Endo/Other  neg diabetesHypothyroidism   Renal/GU negative Renal ROS     Musculoskeletal negative musculoskeletal ROS (+)   Abdominal (+) + obese,   Peds  Hematology  (+) anemia ,   Anesthesia Other Findings Past Medical History: No date: Anxiety No date: Chicken pox No date: Depression No date: Eating disorder     Comment:  Has had residential treatment and hospitalization               previously. No date: Heart murmur     Comment:  as a child No date: History of anemia     Comment:  RECIEVES IRON INFUSIONS YEARLY 2002: History of pulmonary embolus (PE)     Comment:  [facility] negative per              pt No date: Hypothyroidism     Comment:  no meds currently No date: PTSD (post-traumatic stress disorder)   Reproductive/Obstetrics                             Anesthesia Physical Anesthesia Plan  ASA: II  Anesthesia Plan: General   Post-op Pain Management:    Induction: Intravenous  PONV Risk Score and Plan: 2 and Ondansetron, Dexamethasone and  Midazolam  Airway Management Planned: Oral ETT  Additional Equipment:   Intra-op Plan:   Post-operative Plan: Extubation in OR  Informed Consent: I have reviewed the patients History and Physical, chart, labs and discussed the procedure including the risks, benefits and alternatives for the proposed anesthesia with the patient or authorized representative who has indicated his/her understanding and acceptance.   Dental advisory given  Plan Discussed with: CRNA and Anesthesiologist  Anesthesia Plan Comments:         Anesthesia Quick Evaluation

## 2018-05-10 NOTE — Interval H&P Note (Signed)
History and Physical Interval Note:  05/10/2018 1:46 PM  Sharon Gibson  has presented today for surgery, with the diagnosis of LEFT OVARIAN CYST  The various methods of treatment have been discussed with the patient and family. After consideration of risks, benefits and other options for treatment, the patient has consented to  Procedure(s): LAPAROSCOPIC OVARIAN CYSTECTOMY (Left) LAPAROSCOPIC OOPHORECTOMY (Left) as a surgical intervention .  The patient's history has been reviewed, patient examined, no change in status, stable for surgery.  I have reviewed the patient's chart and labs.  Questions were answered to the patient's satisfaction.     Christanna R Schuman

## 2018-05-10 NOTE — Anesthesia Procedure Notes (Signed)
Procedure Name: Intubation Date/Time: 05/10/2018 2:45 PM Performed by: Andria Frames, MD Pre-anesthesia Checklist: Patient identified, Patient being monitored, Timeout performed, Emergency Drugs available and Suction available Patient Re-evaluated:Patient Re-evaluated prior to induction Oxygen Delivery Method: Circle system utilized Preoxygenation: Pre-oxygenation with 100% oxygen Induction Type: IV induction Ventilation: Mask ventilation without difficulty Laryngoscope Size: Mac and 3 Grade View: Grade I Tube type: Oral Tube size: 7.0 mm Number of attempts: 1 Airway Equipment and Method: Stylet Placement Confirmation: ETT inserted through vocal cords under direct vision,  positive ETCO2 and breath sounds checked- equal and bilateral Secured at: 21 cm Tube secured with: Tape Dental Injury: Teeth and Oropharynx as per pre-operative assessment

## 2018-05-11 ENCOUNTER — Encounter: Payer: Self-pay | Admitting: Obstetrics and Gynecology

## 2018-05-12 LAB — SURGICAL PATHOLOGY

## 2018-05-13 NOTE — Progress Notes (Signed)
Endometrioma, released to mychart.

## 2018-05-17 ENCOUNTER — Ambulatory Visit (INDEPENDENT_AMBULATORY_CARE_PROVIDER_SITE_OTHER): Payer: Medicaid Other | Admitting: Obstetrics and Gynecology

## 2018-05-17 ENCOUNTER — Encounter: Payer: Self-pay | Admitting: Obstetrics and Gynecology

## 2018-05-17 VITALS — BP 120/72 | HR 90 | Ht 63.0 in

## 2018-05-17 DIAGNOSIS — Z9889 Other specified postprocedural states: Secondary | ICD-10-CM

## 2018-05-17 NOTE — Progress Notes (Signed)
  Postoperative Follow-up Patient presents post op from  Laparoscopic ovarian cystectom  for  Endometrioma. Subjective: Patient reports some improvement in her preop symptoms. Eating a regular diet without difficulty. Pain is controlled without any medications.  Activity: normal activities of daily living. Patient reports additional symptom's since surgery of None.  Objective: BP 120/72   Pulse 90   Ht 5\' 3"  (1.6 m)   LMP  (LMP Unknown)   BMI 44.11 kg/m  Physical Exam Constitutional:      Appearance: Normal appearance. She is well-developed.  HENT:     Head: Normocephalic and atraumatic.  Eyes:     Extraocular Movements: Extraocular movements intact.     Pupils: Pupils are equal, round, and reactive to light.  Neck:     Thyroid: No thyromegaly.  Cardiovascular:     Rate and Rhythm: Normal rate and regular rhythm.     Heart sounds: Normal heart sounds.  Pulmonary:     Effort: Pulmonary effort is normal.     Breath sounds: Normal breath sounds.  Abdominal:     General: Bowel sounds are normal. There is no distension.     Palpations: Abdomen is soft. There is no mass.  Musculoskeletal:     Cervical back: Neck supple.  Neurological:     Mental Status: She is alert and oriented to person, place, and time.  Skin:    General: Skin is warm and dry.  Psychiatric:        Behavior: Behavior normal.        Thought Content: Thought content normal.        Judgment: Judgment normal.  Vitals reviewed.     Assessment: s/p :  Laparoscopic ovarian cystectomy  stable  Plan: Patient has done well after surgery with no apparent complications.  I have discussed the post-operative course to date, and the expected progress moving forward.  The patient understands what complications to be concerned about.  I will see the patient in routine follow up, or sooner if needed.    Activity plan: No restriction..  Pelvic rest.  Doing well post operatively Discussed options for treatment of  endometriosis including orlissa.   Adelene Idlerhristanna Devery Odwyer MD, Merlinda FrederickFACOG [facility], [facility] 01/14/2021 11:17 PM

## 2018-06-13 DIAGNOSIS — N809 Endometriosis, unspecified: Secondary | ICD-10-CM | POA: Insufficient documentation

## 2018-06-13 NOTE — Progress Notes (Signed)
  Postoperative Follow-up Patient presents post op from laparoscopic ovarian cystectomy for endometrioma, 1 month ago.  Subjective: Patient reports marked improvement in her preop symptoms. Eating a regular diet without difficulty. The patient is not having any pain.  Activity: normal activities of daily living. Patient reports vaginal sx's of None.  She reports she has a rash on her back which started 1 week after surgery. She has been trying to wash her skin carefully with the surgical prep soap she was given. She has not seen an improvement. She reports occasional mild itching.   Objective: LMP  (LMP Unknown)    Review of Systems  Constitutional: Negative for chills, fever, malaise/fatigue and weight loss.  HENT: Negative for congestion, hearing loss and sinus pain.   Eyes: Negative for blurred vision and double vision.  Respiratory: Negative for cough, sputum production, shortness of breath and wheezing.   Cardiovascular: Negative for chest pain, palpitations, orthopnea and leg swelling.  Gastrointestinal: Negative for abdominal pain, constipation, diarrhea, nausea and vomiting.  Genitourinary: Negative for dysuria, flank pain, frequency, hematuria and urgency.  Musculoskeletal: Negative for back pain, falls and joint pain.  Skin: Positive for itching and rash.  Neurological: Negative for dizziness and headaches.  Psychiatric/Behavioral: Negative for depression, substance abuse and suicidal ideas. The patient is nervous/anxious.      Physical Exam  Constitutional: She is oriented to person, place, and time. She appears well-developed.  Genitourinary: Vagina normal and uterus normal. There is no lesion on the right labia. There is no lesion on the left labia. Vagina exhibits no lesion. Right adnexum does not display mass. Left adnexum does not display mass. Cervix does not exhibit motion tenderness.  HENT:  Head: Normocephalic and atraumatic.  Eyes: EOM are normal.  Neck: Neck  supple. No thyromegaly present.  Cardiovascular: Normal rate, regular rhythm and normal heart sounds.  Pulmonary/Chest: Effort normal and breath sounds normal.     Right breast exhibits no inverted nipple, no mass, no nipple discharge and no skin change. Left breast exhibits no inverted nipple, no mass, no nipple discharge and no skin change.  Abdominal: Soft. Normal appearance and bowel sounds are normal. She exhibits no distension and no mass.    Neurological: She is alert and oriented to person, place, and time.  Skin: Skin is warm and dry.  Psychiatric: She has a normal mood and affect. Her behavior is normal. Judgment and thought content normal.  Vitals reviewed.   Assessment: s/p :  Laparoscopic ovarian cystectomy  stable  Plan: Patient has done well after surgery with no apparent complications.  I have discussed the post-operative course to date, and the expected progress moving forward.  The patient understands what complications to be concerned about.  I will see the patient in routine follow up, or sooner if needed.    Rash may be tinea. Advised to apply over the counter Lamisil ointment. Will also give a steroid ointment. Will refer to dermatology for further evaluation and management.   She has not yet established a primary care physician. I gave her a hotline phone number to establish care with a primary care physician. I refilled her anxiety and antidepressant medications for a 3 month supply but asked her to establish a primary care physician moving forward.    Activity plan: No restriction.  Sharon Gibson R Danialle Dement 06/13/2018, 10:47 PM

## 2018-06-14 ENCOUNTER — Ambulatory Visit (INDEPENDENT_AMBULATORY_CARE_PROVIDER_SITE_OTHER): Payer: Medicaid Other | Admitting: Obstetrics and Gynecology

## 2018-06-14 ENCOUNTER — Encounter: Payer: Self-pay | Admitting: Obstetrics and Gynecology

## 2018-06-14 VITALS — BP 116/72 | HR 72 | Ht 63.0 in | Wt 252.5 lb

## 2018-06-14 DIAGNOSIS — N80129 Deep endometriosis of ovary, unspecified ovary: Secondary | ICD-10-CM

## 2018-06-14 DIAGNOSIS — N809 Endometriosis, unspecified: Secondary | ICD-10-CM

## 2018-06-14 DIAGNOSIS — Z9889 Other specified postprocedural states: Secondary | ICD-10-CM

## 2018-06-14 DIAGNOSIS — F329 Major depressive disorder, single episode, unspecified: Secondary | ICD-10-CM

## 2018-06-14 DIAGNOSIS — F32A Depression, unspecified: Secondary | ICD-10-CM

## 2018-06-14 DIAGNOSIS — R21 Rash and other nonspecific skin eruption: Secondary | ICD-10-CM

## 2018-06-14 DIAGNOSIS — N801 Endometriosis of ovary: Secondary | ICD-10-CM

## 2018-06-14 DIAGNOSIS — N83202 Unspecified ovarian cyst, left side: Secondary | ICD-10-CM

## 2018-06-14 DIAGNOSIS — F419 Anxiety disorder, unspecified: Secondary | ICD-10-CM

## 2018-06-14 MED ORDER — CLONAZEPAM 0.5 MG PO TABS: 0.5000 mg | ORAL_TABLET | Freq: Two times a day (BID) | ORAL | 3 refills | Status: DC | PRN

## 2018-06-14 MED ORDER — SERTRALINE HCL 100 MG PO TABS: 100.0000 mg | ORAL_TABLET | Freq: Every day | ORAL | 0 refills | Status: DC

## 2018-06-14 MED ORDER — HYDROCORTISONE 2.5 % EX CREA: TOPICAL_CREAM | Freq: Two times a day (BID) | CUTANEOUS | 0 refills | Status: DC

## 2018-10-14 ENCOUNTER — Ambulatory Visit (INDEPENDENT_AMBULATORY_CARE_PROVIDER_SITE_OTHER): Payer: BLUE CROSS/BLUE SHIELD | Admitting: Family Medicine

## 2018-10-14 ENCOUNTER — Other Ambulatory Visit: Payer: Self-pay

## 2018-10-14 ENCOUNTER — Encounter: Payer: Self-pay | Admitting: Family Medicine

## 2018-10-14 VITALS — BP 113/78 | HR 68 | Temp 98.6°F | Ht 63.0 in

## 2018-10-14 DIAGNOSIS — F4310 Post-traumatic stress disorder, unspecified: Secondary | ICD-10-CM

## 2018-10-14 DIAGNOSIS — F502 Bulimia nervosa: Secondary | ICD-10-CM

## 2018-10-14 DIAGNOSIS — F332 Major depressive disorder, recurrent severe without psychotic features: Secondary | ICD-10-CM

## 2018-10-14 DIAGNOSIS — F411 Generalized anxiety disorder: Secondary | ICD-10-CM

## 2018-10-14 DIAGNOSIS — Z8639 Personal history of other endocrine, nutritional and metabolic disease: Secondary | ICD-10-CM

## 2018-10-14 DIAGNOSIS — E538 Deficiency of other specified B group vitamins: Secondary | ICD-10-CM | POA: Diagnosis not present

## 2018-10-14 DIAGNOSIS — Z9884 Bariatric surgery status: Secondary | ICD-10-CM | POA: Diagnosis not present

## 2018-10-14 DIAGNOSIS — D509 Iron deficiency anemia, unspecified: Secondary | ICD-10-CM | POA: Diagnosis not present

## 2018-10-14 LAB — UA/M W/RFLX CULTURE, ROUTINE
BILIRUBIN UA: NEGATIVE
Glucose, UA: NEGATIVE
Ketones, UA: NEGATIVE
Nitrite, UA: NEGATIVE
PH UA: 5 (ref 5.0–7.5)
PROTEIN UA: NEGATIVE
SPEC GRAV UA: 1.025 (ref 1.005–1.030)
Urobilinogen, Ur: 0.2 mg/dL (ref 0.2–1.0)

## 2018-10-14 LAB — MICROSCOPIC EXAMINATION
Bacteria, UA: NONE SEEN
RBC, UA: NONE SEEN /hpf (ref 0–2)

## 2018-10-14 LAB — BAYER DCA HB A1C WAIVED: HB A1C: 5 % (ref <7.0)

## 2018-10-14 MED ORDER — SERTRALINE HCL 100 MG PO TABS: 200.0000 mg | ORAL_TABLET | Freq: Every day | ORAL | 1 refills | Status: DC

## 2018-10-14 NOTE — Progress Notes (Signed)
BP 113/78   Pulse 68   Temp 98.6 F (37 C) (Oral)   Ht 5\' 3"  (1.6 m)   SpO2 97%   BMI 44.73 kg/m    Subjective:    Patient ID: Sharon Gibson, female    DOB: [birth year], 38 y.o.   MRN: [id number]  HPI: Sharon Gibson is a 38 y.o. female who presents today to establish care. She has not seen a primary care doctor in about 3 years and has not had her labs checked in several years.  Chief Complaint  Patient presents with  . Establish Care    pt would like to discuss about blood work done and medication refill, zoloft   Has not seen a primary care doctor in several years. Has a history of iron deficiency with infusions. Has not had one in quite a long time. She notes that she doesn't absorb well after her gastric bypass, so she doesn't have good luck with oral supplements. She does note that she has been tired.   Has a long history of eating disorder- She has been doing well for a while, but does tend to get very triggered when she sees her weight. She was doing blind weights with her OBGYN- but her mychart showed her weight, and she has been struggling with that. She requests that we do not tell her her weight and we do not let her know it.   DEPRESSION- had been on zoloft 300mg  through psychiatry, but has not seen psychiatry in a while Mood status: stable Satisfied with current treatment?: yes Symptom severity: moderate  Duration of current treatment : chronic Side effects: no Medication compliance: excellent compliance Psychotherapy/counseling: yes in the past Previous psychiatric medications: 300mg  zoloft, klonopin Depressed mood: yes Anxious mood: yes Anhedonia: no Significant weight loss or gain: yes Insomnia: yes  Fatigue: yes Feelings of worthlessness or guilt: yes Impaired concentration/indecisiveness: yes Suicidal ideations: no Hopelessness: no Crying spells: yes Depression screen PHQ 2/9 10/14/2018  Decreased Interest 2  Down, Depressed, Hopeless 3  PHQ  - 2 Score 5  Altered sleeping 3  Tired, decreased energy 3  Change in appetite 3  Feeling bad or failure about yourself  3  Trouble concentrating 3  Moving slowly or fidgety/restless 0  Suicidal thoughts 1  PHQ-9 Score 21   GAD 7 : Generalized Anxiety Score 10/14/2018  Nervous, Anxious, on Edge 3  Control/stop worrying 3  Worry too much - different things 3  Trouble relaxing 3  Restless 3  Easily annoyed or irritable 2  Afraid - awful might happen 2  Total GAD 7 Score 19  Anxiety Difficulty Somewhat difficult    Active Ambulatory Problems    Diagnosis Date Noted  . S/P gastric bypass 03/20/2016  . Eating disorder 03/20/2016  . Insomnia 03/20/2016  . PTSD (post-traumatic stress disorder) 03/20/2016  . History of hypothyroidism 03/20/2016  . Iron deficiency anemia 04/08/2016  . B12 deficiency 04/08/2016  . Severe recurrent major depression without psychotic features (HCC) 07/23/2016  . Bulimia nervosa, in partial remission, mild 07/23/2016  . Right knee pain 09/02/2016  . Generalized anxiety disorder 10/12/2016  . Hx pulmonary embolism 02/03/2018  . Ovarian cyst 03/01/2018  . PCOS (polycystic ovarian syndrome) 12/14/2014  . Endometriosis determined by laparoscopy 06/13/2018   Resolved Ambulatory Problems    Diagnosis Date Noted  . Dehydration 04/06/2016  . Burning sensation of skin 07/13/2016  . Viral illness 07/13/2016  . Head injury 07/21/2016  . Hamstring strain 09/02/2016  .  Rash 10/16/2016   Past Medical History:  Diagnosis Date  . Anxiety   . Chicken pox   . Depression   . Heart murmur   . History of anemia   . History of pulmonary embolus (PE) 2002  . Hypothyroidism    Past Surgical History:  Procedure Laterality Date  . CHOLECYSTECTOMY  2003  . EXCISION OF ENDOMETRIOMA    . GASTRIC BYPASS  2003  . LAPAROSCOPIC OVARIAN CYSTECTOMY Left 05/10/2018   Procedure: LAPAROSCOPIC OVARIAN CYSTECTOMY;  Surgeon: Natale Milch, MD;  Location: [facility] ORS;   Service: Gynecology;  Laterality: Left;   Outpatient Encounter Medications as of 10/14/2018  Medication Sig  . clonazePAM (KLONOPIN) 0.5 MG tablet Take 1 tablet (0.5 mg total) by mouth 2 (two) times daily as needed for anxiety.  Marland Kitchen levonorgestrel (MIRENA) 20 MCG/24HR IUD 1 each by Intrauterine route once.  . Multiple Vitamin (MULTIVITAMIN) tablet Take 1 tablet by mouth daily.  . sertraline (ZOLOFT) 100 MG tablet Take 2 tablets (200 mg total) by mouth at bedtime.  . [DISCONTINUED] acetaminophen (TYLENOL) 500 MG tablet Take 1,000 mg by mouth every 6 (six) hours as needed for moderate pain.  . [DISCONTINUED] gabapentin (NEURONTIN) 300 MG capsule Take 1 capsule (300 mg total) by mouth 3 (three) times daily. (Patient not taking: Reported on 06/14/2018)  . [DISCONTINUED] hydrocortisone 2.5 % cream Apply topically 2 (two) times daily.  . [DISCONTINUED] sertraline (ZOLOFT) 100 MG tablet Take 1 tablet (100 mg total) by mouth at bedtime.   No facility-administered encounter medications on file as of 10/14/2018.    Allergies  Allergen Reactions  . Penicillins Hives    Has patient had a PCN reaction causing immediate rash, facial/tongue/throat swelling, SOB or lightheadedness with hypotension: yes Has patient had a PCN reaction causing severe rash involving mucus membranes or skin necrosis: no Has patient had a PCN reaction that required hospitalization: yes Has patient had a PCN reaction occurring within the last 10 years: no If all of the above answers are "NO", then may proceed with Cephalosporin use.   . Azithromycin   . Other     Narcotics- Burning in the stomach (whether PO or IV)  . Strawberry (Diagnostic) Swelling    AND WALNUTS-TONGUE SWELLING   Social History   Socioeconomic History  . Marital status: Single    Spouse name: Not on file  . Number of children: Not on file  . Years of education: Not on file  . Highest education level: Not on file  Occupational History  . Not on file    Social Needs  . Financial resource strain: Not on file  . Food insecurity:    Worry: Not on file    Inability: Not on file  . Transportation needs:    Medical: Not on file    Non-medical: Not on file  Tobacco Use  . Smoking status: Never Smoker  . Smokeless tobacco: Never Used  Substance and Sexual Activity  . Alcohol use: Yes    Alcohol/week: 0.0 - 1.0 standard drinks    Comment: rare  . Drug use: No  . Sexual activity: Yes    Birth control/protection: I.U.D.    Comment: Mirena   Lifestyle  . Physical activity:    Days per week: 7 days    Minutes per session: 60 min  . Stress: Very much  Relationships  . Social connections:    Talks on phone: Not on file    Gets together: Not on file  Attends religious service: Not on file    Active member of club or organization: Not on file    Attends meetings of clubs or organizations: Not on file    Relationship status: Not on file  Other Topics Concern  . Not on file  Social History Narrative  . Not on file   Family History  Problem Relation Age of Onset  . Diabetes Father   . Anxiety disorder Father   . Prostate cancer Paternal Grandfather   . Alzheimer's disease Maternal Grandmother   . Alzheimer's disease Maternal Grandfather   . Lung cancer Paternal Grandmother     Review of Systems  Constitutional: Negative.   Respiratory: Negative.   Cardiovascular: Negative.   Musculoskeletal: Negative.   Skin: Negative.   Neurological: Negative.   Hematological: Negative.   Psychiatric/Behavioral: Positive for dysphoric mood. Negative for agitation, behavioral problems, confusion, decreased concentration, hallucinations, self-injury, sleep disturbance and suicidal ideas. The patient is nervous/anxious. The patient is not hyperactive.     Per HPI unless specifically indicated above     Objective:    BP 113/78   Pulse 68   Temp 98.6 F (37 C) (Oral)   Ht 5\' 3"  (1.6 m)   SpO2 97%   BMI 44.73 kg/m   Wt Readings from  Last 3 Encounters:  06/14/18 252 lb 8 oz (114.5 kg)  05/10/18 249 lb (112.9 kg)  05/02/18 249 lb (112.9 kg)    Physical Exam Vitals signs and nursing note reviewed.  Constitutional:      General: She is not in acute distress.    Appearance: Normal appearance. She is not ill-appearing, toxic-appearing or diaphoretic.  HENT:     Head: Normocephalic and atraumatic.     Right Ear: External ear normal.     Left Ear: External ear normal.     Nose: Nose normal.     Mouth/Throat:     Mouth: Mucous membranes are moist.     Pharynx: Oropharynx is clear.  Eyes:     General: No scleral icterus.       Right eye: No discharge.        Left eye: No discharge.     Extraocular Movements: Extraocular movements intact.     Conjunctiva/sclera: Conjunctivae normal.     Pupils: Pupils are equal, round, and reactive to light.  Neck:     Musculoskeletal: Normal range of motion and neck supple.  Cardiovascular:     Rate and Rhythm: Normal rate and regular rhythm.     Pulses: Normal pulses.     Heart sounds: Normal heart sounds. No murmur. No friction rub. No gallop.   Pulmonary:     Effort: Pulmonary effort is normal. No respiratory distress.     Breath sounds: Normal breath sounds. No stridor. No wheezing, rhonchi or rales.  Chest:     Chest wall: No tenderness.  Musculoskeletal: Normal range of motion.  Skin:    General: Skin is warm and dry.     Capillary Refill: Capillary refill takes less than 2 seconds.     Coloration: Skin is not jaundiced or pale.     Findings: No bruising, erythema, lesion or rash.  Neurological:     General: No focal deficit present.     Mental Status: She is alert and oriented to person, place, and time. Mental status is at baseline.  Psychiatric:        Mood and Affect: Mood normal.        Behavior: Behavior  normal.        Thought Content: Thought content normal.        Judgment: Judgment normal.     Results for orders placed or performed in visit on 10/14/18    Microscopic Examination  Result Value Ref Range   WBC, UA 0-5 0 - 5 /hpf   RBC, UA None seen 0 - 2 /hpf   Epithelial Cells (non renal) 0-10 0 - 10 /hpf   Bacteria, UA None seen None seen/Few  Iron and TIBC  Result Value Ref Range   Total Iron Binding Capacity 395 250 - 450 ug/dL   UIBC 161 096 - 045 ug/dL   Iron 26 (L) 27 - 409 ug/dL   Iron Saturation 7 (LL) 15 - 55 %  Comprehensive metabolic panel  Result Value Ref Range   Glucose 180 (H) 65 - 99 mg/dL   BUN 20 6 - 20 mg/dL   Creatinine, Ser 8.11 0.57 - 1.00 mg/dL   GFR calc non Af Amer 101 >59 mL/min/1.73   GFR calc Af Amer 117 >59 mL/min/1.73   BUN/Creatinine Ratio 27 (H) 9 - 23   Sodium 139 134 - 144 mmol/L   Potassium 4.4 3.5 - 5.2 mmol/L   Chloride 101 96 - 106 mmol/L   CO2 22 20 - 29 mmol/L   Calcium 9.5 8.7 - 10.2 mg/dL   Total Protein 6.9 6.0 - 8.5 g/dL   Albumin 4.7 3.5 - 5.5 g/dL   Globulin, Total 2.2 1.5 - 4.5 g/dL   Albumin/Globulin Ratio 2.1 1.2 - 2.2   Bilirubin Total <0.2 0.0 - 1.2 mg/dL   Alkaline Phosphatase 84 39 - 117 IU/L   AST 19 0 - 40 IU/L   ALT 16 0 - 32 IU/L  CBC with Differential/Platelet  Result Value Ref Range   WBC 9.5 3.4 - 10.8 x10E3/uL   RBC 4.59 3.77 - 5.28 x10E6/uL   Hemoglobin 12.8 11.1 - 15.9 g/dL   Hematocrit 91.4 78.2 - 46.6 %   MCV 85 79 - 97 fL   MCH 27.9 26.6 - 33.0 pg   MCHC 32.9 31.5 - 35.7 g/dL   RDW 95.6 21.3 - 08.6 %   Platelets 331 150 - 450 x10E3/uL   Neutrophils 62 Not Estab. %   Lymphs 28 Not Estab. %   Monocytes 5 Not Estab. %   Eos 4 Not Estab. %   Basos 1 Not Estab. %   Neutrophils Absolute 6.0 1.4 - 7.0 x10E3/uL   Lymphocytes Absolute 2.6 0.7 - 3.1 x10E3/uL   Monocytes Absolute 0.5 0.1 - 0.9 x10E3/uL   EOS (ABSOLUTE) 0.3 0.0 - 0.4 x10E3/uL   Basophils Absolute 0.1 0.0 - 0.2 x10E3/uL   Immature Granulocytes 0 Not Estab. %   Immature Grans (Abs) 0.0 0.0 - 0.1 x10E3/uL  Bayer DCA Hb A1c Waived  Result Value Ref Range   HB A1C (BAYER DCA - WAIVED) 5.0 <7.0  %  Lipid Panel w/o Chol/HDL Ratio  Result Value Ref Range   Cholesterol, Total 202 (H) 100 - 199 mg/dL   Triglycerides 578 0 - 149 mg/dL   HDL 79 >46 mg/dL   VLDL Cholesterol Cal 24 5 - 40 mg/dL   LDL Calculated 99 0 - 99 mg/dL  Thyroid Panel With TSH  Result Value Ref Range   TSH 8.750 (H) 0.450 - 4.500 uIU/mL   T4, Total 7.1 4.5 - 12.0 ug/dL   T3 Uptake Ratio 24 24 - 39 %   Free  Thyroxine Index 1.7 1.2 - 4.9  UA/M w/rflx Culture, Routine  Result Value Ref Range   Specific Gravity, UA 1.025 1.005 - 1.030   pH, UA 5.0 5.0 - 7.5   Color, UA Yellow Yellow   Appearance Ur Clear Clear   Leukocytes, UA Trace (A) Negative   Protein, UA Negative Negative/Trace   Glucose, UA Negative Negative   Ketones, UA Negative Negative   RBC, UA 1+ (A) Negative   Bilirubin, UA Negative Negative   Urobilinogen, Ur 0.2 0.2 - 1.0 mg/dL   Nitrite, UA Negative Negative   Microscopic Examination See below:   Ferritin  Result Value Ref Range   Ferritin 13 (L) 15 - 150 ng/mL  VITAMIN D 25 Hydroxy (Vit-D Deficiency, Fractures)  Result Value Ref Range   Vit D, 25-Hydroxy 17.5 (L) 30.0 - 100.0 ng/mL  B12 and Folate Panel  Result Value Ref Range   Vitamin B-12 323 232 - 1,245 pg/mL   Folate 16.9 >3.0 ng/mL  PTH, Intact and Calcium  Result Value Ref Range   PTH 47 15 - 65 pg/mL   PTH Interp Comment       Assessment & Plan:   Problem List Items Addressed This Visit      Other   S/P gastric bypass - Primary    Hasn't had blood work done in about 3 years. Will check her vitamins. Continue to monitor.       Relevant Orders   Iron and TIBC (Completed)   Comprehensive metabolic panel (Completed)   CBC with Differential/Platelet (Completed)   Bayer DCA Hb A1c Waived (Completed)   Lipid Panel w/o Chol/HDL Ratio (Completed)   Thyroid Panel With TSH (Completed)   UA/M w/rflx Culture, Routine (Completed)   Ferritin (Completed)   VITAMIN D 25 Hydroxy (Vit-D Deficiency, Fractures) (Completed)   B12  and Folate Panel (Completed)   Albumin   PTH, Intact and Calcium (Completed)   Microscopic Examination (Completed)   Eating disorder    We will not weigh her until we can confirm with mychart that her weight will NOT show up on her mychart. We discussed that when we do weigh her we will do it blind and will make sure not to tell her the result. We will hold off on weighing her at this time until we can confirm this is going to happen. Discussed that we cannot refill clonazepam today as we do not refill any controlled substances on the 1st visit- will get her refill on her sertraline. She states that she was on 300mg  in the past. We will get her into see psychiatry. Referral to psychiatry made today.       Relevant Orders   Ambulatory referral to Psychiatry   PTSD (post-traumatic stress disorder)    Discussed that we cannot refill clonazepam today as we do not refill any controlled substances on the 1st visit- will get her refill on her sertraline. She states that she was on 300mg  in the past. We will get her into see psychiatry. Referral to psychiatry made today.       Relevant Medications   sertraline (ZOLOFT) 100 MG tablet   Other Relevant Orders   Ambulatory referral to Psychiatry   History of hypothyroidism    Checking labs today. Await results.       Iron deficiency anemia    Rechecking levels today. Await results. Call with any concerns.       Relevant Orders   CBC with Differential/Platelet (Completed)  Ferritin (Completed)   B12 deficiency    Rechecking levels today. Await results. Call with any concerns.       Severe recurrent major depression without psychotic features (HCC)    Discussed that we cannot refill clonazepam today as we do not refill any controlled substances on the 1st visit- will get her refill on her sertraline. She states that she was on 300mg  in the past. We will get her into see psychiatry. Referral to psychiatry made today.       Relevant Medications    sertraline (ZOLOFT) 100 MG tablet   Other Relevant Orders   Ambulatory referral to Psychiatry   Bulimia nervosa, in partial remission, mild    Discussed that we cannot refill clonazepam today as we do not refill any controlled substances on the 1st visit- will get her refill on her sertraline. She states that she was on 300mg  in the past. We will get her into see psychiatry. Referral to psychiatry made today.       Relevant Orders   Ambulatory referral to Psychiatry   Generalized anxiety disorder    Discussed that we cannot refill clonazepam today as we do not refill any controlled substances on the 1st visit- will get her refill on her sertraline. She states that she was on 300mg  in the past. We will get her into see psychiatry. Referral to psychiatry made today.       Relevant Medications   sertraline (ZOLOFT) 100 MG tablet   Other Relevant Orders   Ambulatory referral to Psychiatry       Follow up plan: Return ASAP, for Physical.

## 2018-10-15 LAB — THYROID PANEL WITH TSH
FREE THYROXINE INDEX: 1.7 (ref 1.2–4.9)
T3 UPTAKE RATIO: 24 % (ref 24–39)
T4, Total: 7.1 ug/dL (ref 4.5–12.0)
TSH: 8.75 u[IU]/mL — ABNORMAL HIGH (ref 0.450–4.500)

## 2018-10-15 LAB — B12 AND FOLATE PANEL
FOLATE: 16.9 ng/mL (ref >3.0)
VITAMIN B 12: 323 pg/mL (ref 232–1245)

## 2018-10-15 LAB — LIPID PANEL W/O CHOL/HDL RATIO
CHOLESTEROL TOTAL: 202 mg/dL — AB (ref 100–199)
HDL: 79 mg/dL (ref >39)
LDL CALC: 99 mg/dL (ref 0–99)
TRIGLYCERIDES: 122 mg/dL (ref 0–149)
VLDL CHOLESTEROL CAL: 24 mg/dL (ref 5–40)

## 2018-10-15 LAB — COMPREHENSIVE METABOLIC PANEL
ALT: 16 IU/L (ref 0–32)
AST: 19 IU/L (ref 0–40)
Albumin/Globulin Ratio: 2.1 (ref 1.2–2.2)
Albumin: 4.7 g/dL (ref 3.5–5.5)
Alkaline Phosphatase: 84 IU/L (ref 39–117)
BUN / CREAT RATIO: 27 — AB (ref 9–23)
BUN: 20 mg/dL (ref 6–20)
Bilirubin Total: <0.2 mg/dL (ref 0.0–1.2)
CALCIUM: 9.5 mg/dL (ref 8.7–10.2)
CO2: 22 mmol/L (ref 20–29)
Chloride: 101 mmol/L (ref 96–106)
Creatinine, Ser: 0.75 mg/dL (ref 0.57–1.00)
GFR calc Af Amer: 117 mL/min/{1.73_m2} (ref >59)
GFR, EST NON AFRICAN AMERICAN: 101 mL/min/{1.73_m2} (ref >59)
GLUCOSE: 180 mg/dL — AB (ref 65–99)
Globulin, Total: 2.2 g/dL (ref 1.5–4.5)
POTASSIUM: 4.4 mmol/L (ref 3.5–5.2)
Sodium: 139 mmol/L (ref 134–144)
TOTAL PROTEIN: 6.9 g/dL (ref 6.0–8.5)

## 2018-10-15 LAB — CBC WITH DIFFERENTIAL/PLATELET
BASOS ABS: 0.1 10*3/uL (ref 0.0–0.2)
Basos: 1 %
EOS (ABSOLUTE): 0.3 10*3/uL (ref 0.0–0.4)
Eos: 4 %
HEMOGLOBIN: 12.8 g/dL (ref 11.1–15.9)
Hematocrit: 38.9 % (ref 34.0–46.6)
IMMATURE GRANS (ABS): 0 10*3/uL (ref 0.0–0.1)
IMMATURE GRANULOCYTES: 0 %
LYMPHS: 28 %
Lymphocytes Absolute: 2.6 10*3/uL (ref 0.7–3.1)
MCH: 27.9 pg (ref 26.6–33.0)
MCHC: 32.9 g/dL (ref 31.5–35.7)
MCV: 85 fL (ref 79–97)
MONOCYTES: 5 %
Monocytes Absolute: 0.5 10*3/uL (ref 0.1–0.9)
NEUTROS PCT: 62 %
Neutrophils Absolute: 6 10*3/uL (ref 1.4–7.0)
PLATELETS: 331 10*3/uL (ref 150–450)
RBC: 4.59 x10E6/uL (ref 3.77–5.28)
RDW: 12.8 % (ref 11.7–15.4)
WBC: 9.5 10*3/uL (ref 3.4–10.8)

## 2018-10-15 LAB — IRON AND TIBC
IRON SATURATION: 7 % — AB (ref 15–55)
Iron: 26 ug/dL — ABNORMAL LOW (ref 27–159)
Total Iron Binding Capacity: 395 ug/dL (ref 250–450)
UIBC: 369 ug/dL (ref 131–425)

## 2018-10-15 LAB — PTH, INTACT AND CALCIUM: PTH: 47 pg/mL (ref 15–65)

## 2018-10-15 LAB — VITAMIN D 25 HYDROXY (VIT D DEFICIENCY, FRACTURES): VIT D 25 HYDROXY: 17.5 ng/mL — AB (ref 30.0–100.0)

## 2018-10-15 LAB — FERRITIN: Ferritin: 13 ng/mL — ABNORMAL LOW (ref 15–150)

## 2018-10-16 ENCOUNTER — Encounter: Payer: Self-pay | Admitting: Family Medicine

## 2018-10-16 NOTE — Assessment & Plan Note (Signed)
Hasn't had blood work done in about 3 years. Will check her vitamins. Continue to monitor.

## 2018-10-16 NOTE — Assessment & Plan Note (Signed)
Discussed that we cannot refill clonazepam today as we do not refill any controlled substances on the 1st visit- will get her refill on her sertraline. She states that she was on 300mg in the past. We will get her into see psychiatry. Referral to psychiatry made today.  

## 2018-10-16 NOTE — Assessment & Plan Note (Signed)
Rechecking levels today. Await results. Call with any concerns.  

## 2018-10-16 NOTE — Assessment & Plan Note (Signed)
Checking labs today. Await results.  

## 2018-10-16 NOTE — Assessment & Plan Note (Signed)
Discussed that we cannot refill clonazepam today as we do not refill any controlled substances on the 1st visit- will get her refill on her sertraline. She states that she was on 300mg  in the past. We will get her into see psychiatry. Referral to psychiatry made today.

## 2018-10-16 NOTE — Assessment & Plan Note (Addendum)
We will not weigh her until we can confirm with mychart that her weight will NOT show up on her mychart. We discussed that when we do weigh her we will do it blind and will make sure not to tell her the result. We will hold off on weighing her at this time until we can confirm this is going to happen. Discussed that we cannot refill clonazepam today as we do not refill any controlled substances on the 1st visit- will get her refill on her sertraline. She states that she was on 300mg  in the past. We will get her into see psychiatry. Referral to psychiatry made today.

## 2018-10-17 ENCOUNTER — Other Ambulatory Visit: Payer: Self-pay | Admitting: Family Medicine

## 2018-10-17 DIAGNOSIS — D509 Iron deficiency anemia, unspecified: Secondary | ICD-10-CM

## 2018-10-17 DIAGNOSIS — Z8639 Personal history of other endocrine, nutritional and metabolic disease: Secondary | ICD-10-CM

## 2018-10-17 MED ORDER — VITAMIN D (ERGOCALCIFEROL) 1.25 MG (50000 UNIT) PO CAPS: 50000.0000 [IU] | ORAL_CAPSULE | ORAL | 3 refills | Status: DC

## 2018-10-17 MED ORDER — LEVOTHYROXINE SODIUM 50 MCG PO TABS: 50.0000 ug | ORAL_TABLET | Freq: Every day | ORAL | 3 refills | Status: DC

## 2018-10-20 ENCOUNTER — Telehealth: Payer: Self-pay | Admitting: Family Medicine

## 2018-10-20 NOTE — Telephone Encounter (Signed)
Copied from CRM [phone]. Topic: Quick Communication - See Telephone Encounter >> Oct 20, 2018  3:51 PM Jens SomMedley, Jennifer A wrote: CRM for notification. See Telephone encounter for: 10/20/18.  Dr. Caryn SectionAarti Kapur office called they do not treat eating disorders.  Please advise with the patient thank you

## 2018-10-20 NOTE — Telephone Encounter (Signed)
Checked with [facility]- they also do not treat eating disorders. Recommended [facility]- checking in [place] to see if anyone treats them there.

## 2018-10-20 NOTE — Telephone Encounter (Signed)
[facility] [address], [place], [place] [address] [phone]   Checked- they should take her insurance and they treat eating disorders. Please change location of her referral. This is still an urgent referral. Thanks!

## 2018-10-26 ENCOUNTER — Inpatient Hospital Stay: Payer: BLUE CROSS/BLUE SHIELD | Attending: Hematology and Oncology | Admitting: Hematology and Oncology

## 2018-10-26 ENCOUNTER — Inpatient Hospital Stay: Payer: BLUE CROSS/BLUE SHIELD

## 2018-10-26 VITALS — BP 126/85 | HR 77 | Temp 98.3°F | Resp 18 | Wt 253.2 lb

## 2018-10-26 DIAGNOSIS — K9589 Other complications of other bariatric procedure: Secondary | ICD-10-CM

## 2018-10-26 DIAGNOSIS — D509 Iron deficiency anemia, unspecified: Secondary | ICD-10-CM | POA: Diagnosis not present

## 2018-10-26 DIAGNOSIS — Z86711 Personal history of pulmonary embolism: Secondary | ICD-10-CM | POA: Diagnosis not present

## 2018-10-26 DIAGNOSIS — D508 Other iron deficiency anemias: Secondary | ICD-10-CM

## 2018-10-26 DIAGNOSIS — E611 Iron deficiency: Secondary | ICD-10-CM

## 2018-10-26 DIAGNOSIS — E538 Deficiency of other specified B group vitamins: Secondary | ICD-10-CM | POA: Diagnosis not present

## 2018-10-26 DIAGNOSIS — R5383 Other fatigue: Secondary | ICD-10-CM | POA: Diagnosis not present

## 2018-10-26 DIAGNOSIS — Z9884 Bariatric surgery status: Secondary | ICD-10-CM

## 2018-10-26 LAB — PREGNANCY, URINE: Preg Test, Ur: NEGATIVE

## 2018-10-26 MED ORDER — CYANOCOBALAMIN 1000 MCG/ML IJ SOLN
1000.0000 ug | Freq: Once | INTRAMUSCULAR | Status: AC
  Administered 2018-10-26: 1000 ug via INTRAMUSCULAR

## 2018-10-26 NOTE — Progress Notes (Signed)
Pt here for follow up. Denies any concerns at this time.  

## 2018-10-26 NOTE — Progress Notes (Signed)
[facility]-  Cancer Center  Clinic day:  10/26/18  Chief Complaint: Sharon Gibson is a 38 y.o. female s/p gastric bypass surgery with subsequent B12 and iron deficiency who is seen for reassessment.  HPI:  The patient was last seen in the hematology clinic on 10/07/2016.  At that time, she was fatigued.  She did not feel refreshed when she woke up in the morning (? sleep apnea).  Exam was stable.  Hematocrit was 42.3.  She was lost to follow-up.  She was seen by Dr. Olevia Perches on 10/16/2018 for new patient assessment.  She had not seen a physician in 3 years.  She noted fatigue.  Labs on 10/14/2018 revealed a hematocrit of 38.9, hemoglobin 12.8, MCV 85, platelets 331,000, WBC 9500 with an ANC of 6000. Ferritin was 13 with an iron saturation of 7% and a TIBC of 395.  B12 was 323.  Folate was 16.9.  Creatinine was 0.75.  LFTs were normal.  TSH was 8.750 (0.45-4.5) with a free T4 of 1.7 (1.2-4.9).  During the interim, patient has been experiencing progressive fatigue over the course of the last few months. She denies exertional dyspnea.  Patient denies bleeding; no hematochezia, melena, or gross hematuria.  Menstrual cycles have been "weird" following an endometrial mass removal over this past summer. Prior to her surgery, she was bleeding for "6 weeks straight". Patient followed annually by GYN.   Patient has received a single dose of IV iron here in the past. Due to history of a questionable "reaction" in [place], [place], patient received pre-medications (APAP, diphenhydramine, and dexamethasone). She describes her reaction as HYPOtension and vertigo symptoms.   Patient denies that she has experienced any B symptoms. She denies any interval infections. She has ice pica and restless legs. Patient advises that she maintains an adequate appetite. She is eating well.  Weight today is 253 lb 3.2 oz (114.8 kg), which compared to 06/14/2018, represents a 1 pound weight  gain.  Patient denies pain in the clinic today.   Past Medical History:  Diagnosis Date  . Anxiety   . Chicken pox   . Depression   . Eating disorder    Has had residential treatment and hospitalization previously.  Marland Kitchen Heart murmur    as a child  . History of anemia    RECIEVES IRON INFUSIONS YEARLY  . History of pulmonary embolus (PE) 2002   bilaterally-followed by [facility] hematology-w/u negative per pt  . Hypothyroidism    no meds currently  . PTSD (post-traumatic stress disorder)     Past Surgical History:  Procedure Laterality Date  . CHOLECYSTECTOMY  2003  . EXCISION OF ENDOMETRIOMA    . GASTRIC BYPASS  2003  . LAPAROSCOPIC OVARIAN CYSTECTOMY Left 05/10/2018   Procedure: LAPAROSCOPIC OVARIAN CYSTECTOMY;  Surgeon: Natale Milch, MD;  Location: [facility] ORS;  Service: Gynecology;  Laterality: Left;    Family History  Problem Relation Age of Onset  . Diabetes Father   . Anxiety disorder Father   . Prostate cancer Paternal Grandfather   . Alzheimer's disease Maternal Grandmother   . Alzheimer's disease Maternal Grandfather   . Lung cancer Paternal Grandmother     Social History:  reports that she has never smoked. She has never used smokeless tobacco. She reports current alcohol use. She reports that she does not use drugs.  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  She lives in [place], [place].  She  works as a Product manager at [employer].  The patient is alone today.  Allergies:  Allergies  Allergen Reactions  . Penicillins Hives    Has patient had a PCN reaction causing immediate rash, facial/tongue/throat swelling, SOB or lightheadedness with hypotension: yes Has patient had a PCN reaction causing severe rash involving mucus membranes or skin necrosis: no Has patient had a PCN reaction that required hospitalization: yes Has patient had a PCN reaction occurring within the last 10 years: no If all of  the above answers are "NO", then may proceed with Cephalosporin use.   . Azithromycin   . Other     Narcotics- Burning in the stomach (whether PO or IV)  . Strawberry (Diagnostic) Swelling    AND WALNUTS-TONGUE SWELLING    Current Medications: Current Outpatient Medications  Medication Sig Dispense Refill  . clonazePAM (KLONOPIN) 0.5 MG tablet Take 1 tablet (0.5 mg total) by mouth 2 (two) times daily as needed for anxiety. 60 tablet 3  . levonorgestrel (MIRENA) 20 MCG/24HR IUD 1 each by Intrauterine route once.    Marland Kitchen levothyroxine (SYNTHROID, LEVOTHROID) 50 MCG tablet Take 1 tablet (50 mcg total) by mouth daily. 30 tablet 3  . Multiple Vitamin (MULTIVITAMIN) tablet Take 1 tablet by mouth daily.    . sertraline (ZOLOFT) 100 MG tablet Take 2 tablets (200 mg total) by mouth at bedtime. 180 tablet 1  . Vitamin D, Ergocalciferol, (DRISDOL) 1.25 MG (50000 UT) CAPS capsule Take 1 capsule (50,000 Units total) by mouth every 7 (seven) days. 12 capsule 3   No current facility-administered medications for this visit.     Review of Systems:  GENERAL:  Feels "not too bad".  No fevers, sweats or weight loss. PERFORMANCE STATUS (ECOG):  1 HEENT:  No visual changes, runny nose, sore throat, mouth sores or tenderness. Lungs:  Shortness of breath on exertion.  No cough.  No hemoptysis. Cardiac:  No chest pain, palpitations, orthopnea, or PND. GI:  No nausea, vomiting, diarrhea, constipation, melena or hematochezia.  Ice pica. GU:  No urgency, frequency, dysuria, or hematuria.  Menstrual cycles "wierd". Musculoskeletal:  No back pain.  No joint pain.  No muscle tenderness. Extremities:  No pain or swelling. Skin:  No rashes or skin changes. Neuro:  Restless legs.  No headache, numbness or weakness, balance or coordination issues. Endocrine:  No diabetes, thyroid issues, hot flashes or night sweats. Psych:  No mood changes, depression or anxiety. Pain:  No focal pain. Review of systems:  All other  systems reviewed and found to be negative.   Physical Exam: Blood pressure 126/85, pulse 77, temperature 98.3 F (36.8 C), temperature source Oral, resp. rate 18, weight 253 lb 3.2 oz (114.8 kg), SpO2 99 %. GENERAL:  Well developed, well nourished, heavyset woman sitting comfortably in the exam room in no acute distress. MENTAL STATUS:  Alert and oriented to person, place and time. HEAD:  Long red hair.  Normocephalic, atraumatic, face symmetric, no Cushingoid features. EYES:  Glasses.  Blue eyes.  Pupils equal round and reactive to light and accomodation.  No conjunctivitis or scleral icterus. ENT:  Oropharynx clear without lesion.  Tongue normal. Mucous membranes moist.  RESPIRATORY:  Clear to auscultation without rales, wheezes or rhonchi. CARDIOVASCULAR:  Regular rate and rhythm without murmur, rub or gallop. ABDOMEN:  Fully round.  Soft, non-tender, with active bowel sounds, and no appreciable hepatosplenomegaly.  No masses. SKIN:  No rashes, ulcers or lesions. EXTREMITIES:  Chronic lower extremity changes.  No skin  discoloration or tenderness.  No palpable cords. LYMPH NODES: No palpable cervical, supraclavicular, axillary or inguinal adenopathy  NEUROLOGICAL: Unremarkable. PSYCH:  Appropriate.    No visits with results within 3 Day(s) from this visit.  Latest known visit with results is:  Office Visit on 10/14/2018  Component Date Value Ref Range Status  . Total Iron Binding Capacity 10/14/2018 395  250 - 450 ug/dL Final  . UIBC 16/06/9603 369  131 - 425 ug/dL Final  . Iron 54/05/8118 26* 27 - 159 ug/dL Final  . Iron Saturation 10/14/2018 7* 15 - 55 % Final  . Glucose 10/14/2018 180* 65 - 99 mg/dL Final  . BUN 14/78/2956 20  6 - 20 mg/dL Final  . Creatinine, Ser 10/14/2018 0.75  0.57 - 1.00 mg/dL Final  . GFR calc non Af Amer 10/14/2018 101  >59 mL/min/1.73 Final  . GFR calc Af Amer 10/14/2018 117  >59 mL/min/1.73 Final  . BUN/Creatinine Ratio 10/14/2018 27* 9 - 23 Final  .  Sodium 10/14/2018 139  134 - 144 mmol/L Final  . Potassium 10/14/2018 4.4  3.5 - 5.2 mmol/L Final  . Chloride 10/14/2018 101  96 - 106 mmol/L Final  . CO2 10/14/2018 22  20 - 29 mmol/L Final  . Calcium 10/14/2018 9.5  8.7 - 10.2 mg/dL Final  . Total Protein 10/14/2018 6.9  6.0 - 8.5 g/dL Final  . Albumin 21/30/8657 4.7  3.5 - 5.5 g/dL Final   Comment:     **Effective October 17, 2018 Albumin reference**       interval will be changing to:              Age                Female          Female           0 -  7 days        3.6 - 4.9      3.6 - 4.9           8 - 30 days        3.4 - 4.7      3.4 - 4.7           1 -  6 month       3.7 - 4.8      3.7 - 4.8    7 months -  2 years       3.9 - 5.0      3.9 - 5.0           3 -  5 years       4.0 - 5.0      4.0 - 5.0           6 - 12 years       4.1 - 5.0      4.0 - 5.0          13 - 30 years       4.1 - 5.2      3.9 - 5.0          31 - 50 years       4.0 - 5.0      3.8 - 4.8          51 - 60 years       3.8 - 4.9      3.8 - 4.9          61 - 70  years       3.8 - 4.8      3.8 - 4.8          71 - 80 years       3.7 - 4.7      3.7 - 4.7          81 - 89 years       3.6 - 4.6      3.6 - 4.6              >89 years       3.5 - 4.6      3.5 - 4.6   . Globulin, Total 10/14/2018 2.2  1.5 - 4.5 g/dL Final  . Albumin/Globulin Ratio 10/14/2018 2.1  1.2 - 2.2 Final  . Bilirubin Total 10/14/2018 <0.2  0.0 - 1.2 mg/dL Final  . Alkaline Phosphatase 10/14/2018 84  39 - 117 IU/L Final  . AST 10/14/2018 19  0 - 40 IU/L Final  . ALT 10/14/2018 16  0 - 32 IU/L Final  . WBC 10/14/2018 9.5  3.4 - 10.8 x10E3/uL Final  . RBC 10/14/2018 4.59  3.77 - 5.28 x10E6/uL Final  . Hemoglobin 10/14/2018 12.8  11.1 - 15.9 g/dL Final  . Hematocrit 16/10/960401/17/2020 38.9  34.0 - 46.6 % Final  . MCV 10/14/2018 85  79 - 97 fL Final  . MCH 10/14/2018 27.9  26.6 - 33.0 pg Final  . MCHC 10/14/2018 32.9  31.5 - 35.7 g/dL Final  . RDW 54/09/811901/17/2020 12.8  11.7 - 15.4 % Final                  **Please note reference interval change**  . Platelets 10/14/2018 331  150 - 450 x10E3/uL Final  . Neutrophils 10/14/2018 62  Not Estab. % Final  . Lymphs 10/14/2018 28  Not Estab. % Final  . Monocytes 10/14/2018 5  Not Estab. % Final  . Eos 10/14/2018 4  Not Estab. % Final  . Basos 10/14/2018 1  Not Estab. % Final  . Neutrophils Absolute 10/14/2018 6.0  1.4 - 7.0 x10E3/uL Final  . Lymphocytes Absolute 10/14/2018 2.6  0.7 - 3.1 x10E3/uL Final  . Monocytes Absolute 10/14/2018 0.5  0.1 - 0.9 x10E3/uL Final  . EOS (ABSOLUTE) 10/14/2018 0.3  0.0 - 0.4 x10E3/uL Final  . Basophils Absolute 10/14/2018 0.1  0.0 - 0.2 x10E3/uL Final  . Immature Granulocytes 10/14/2018 0  Not Estab. % Final  . Immature Grans (Abs) 10/14/2018 0.0  0.0 - 0.1 x10E3/uL Final  . HB A1C (BAYER DCA - WAIVED) 10/14/2018 5.0  <7.0 % Final   Comment:                                       Diabetic Adult            <7.0                                       Healthy Adult        4.3 - 5.7                                                           (  DCCT/NGSP) American Diabetes Association's Summary of Glycemic Recommendations for Adults with Diabetes: Hemoglobin A1c <7.0%. More stringent glycemic goals (A1c <6.0%) may further reduce complications at the cost of increased risk of hypoglycemia.   . Cholesterol, Total 10/14/2018 202* 100 - 199 mg/dL Final  . Triglycerides 10/14/2018 122  0 - 149 mg/dL Final  . HDL 78/29/5621 79  >39 mg/dL Final  . VLDL Cholesterol Cal 10/14/2018 24  5 - 40 mg/dL Final  . LDL Calculated 10/14/2018 99  0 - 99 mg/dL Final  . TSH 30/86/5784 8.750* 0.450 - 4.500 uIU/mL Final  . T4, Total 10/14/2018 7.1  4.5 - 12.0 ug/dL Final  . T3 Uptake Ratio 10/14/2018 24  24 - 39 % Final  . Free Thyroxine Index 10/14/2018 1.7  1.2 - 4.9 Final  . Specific Gravity, UA 10/14/2018 1.025  1.005 - 1.030 Final  . pH, UA 10/14/2018 5.0  5.0 - 7.5 Final  . Color, UA 10/14/2018 Yellow  Yellow Final  . Appearance Ur  10/14/2018 Clear  Clear Final  . Leukocytes, UA 10/14/2018 Trace* Negative Final  . Protein, UA 10/14/2018 Negative  Negative/Trace Final  . Glucose, UA 10/14/2018 Negative  Negative Final  . Ketones, UA 10/14/2018 Negative  Negative Final  . RBC, UA 10/14/2018 1+* Negative Final  . Bilirubin, UA 10/14/2018 Negative  Negative Final  . Urobilinogen, Ur 10/14/2018 0.2  0.2 - 1.0 mg/dL Final  . Nitrite, UA 69/62/9528 Negative  Negative Final  . Microscopic Examination 10/14/2018 See below:   Final  . Ferritin 10/14/2018 13* 15 - 150 ng/mL Final  . Vit D, 25-Hydroxy 10/14/2018 17.5* 30.0 - 100.0 ng/mL Final   Comment: Vitamin D deficiency has been defined by the Institute of Medicine and an Endocrine Society practice guideline as a level of serum 25-OH vitamin D less than 20 ng/mL (1,2). The Endocrine Society went on to further define vitamin D insufficiency as a level between 21 and 29 ng/mL (2). 1. IOM (Institute of Medicine). 2010. Dietary reference    intakes for calcium and D. [place]: The    [employer]. 2. Holick MF, Binkley [place], Bischoff-Ferrari HA, et al.    Evaluation, treatment, and prevention of vitamin D    deficiency: an Endocrine Society clinical practice    guideline. JCEM. 2011 Jul; 96(7):1911-30.   . Vitamin B-12 10/14/2018 323  232 - 1,245 pg/mL Final  . Folate 10/14/2018 16.9  >3.0 ng/mL Final   Comment: A serum folate concentration of less than 3.1 ng/mL is considered to represent clinical deficiency.   Marland Kitchen PTH 10/14/2018 47  15 - 65 pg/mL Final  . PTH Interp 10/14/2018 Comment   Final   Comment: Interpretation                 Intact PTH    Calcium                                 (pg/mL)      (mg/dL) Normal                          15 - 65     8.6 - 10.2 Primary Hyperparathyroidism         >65          >10.2 Secondary Hyperparathyroidism       >65          <10.2  Non-Parathyroid Hypercalcemia       <65          >10.2 Hypoparathyroidism                   <15          < 8.6 Non-Parathyroid Hypocalcemia    15 - 65          < 8.6   . WBC, UA 10/14/2018 0-5  0 - 5 /hpf Final  . RBC, UA 10/14/2018 None seen  0 - 2 /hpf Final  . Epithelial Cells (non renal) 10/14/2018 0-10  0 - 10 /hpf Final  . Bacteria, UA 10/14/2018 None seen  None seen/Few Final    Assessment:  Sharon Gibson is a 38 y.o. female with B12 and iron deficiency anemia s/p gastric bypass surgery (2004).  [place]different [place]) [place] with reactions requiring Benadryl and steroids.  Labs on 03/20/2016 revealed a normal hematocrit but iron deficiency (ferritin 8).  B12 was 185 (low).  She began B12 on 04/07/2016.  She received Venofer 200 mg IV with premedications (Tylenol, Benadryl, and Decadron) on 05/13/2016.  She had side effects of dizziness, lightheadedness, low blood pressure, excess sweating, aches, and pains, nausea, and headache after her infusion.  Ferritin has been followed: 7.9 on 03/20/2016, 8 on 04/08/2016, 22 on 07/08/2016, 18 on 10/07/2016, 10 on 12/16/2016, and 13 on 10/14/2018  She has B12 deficiency.  B12 was 323 on 10/14/2018.  She has a history of multiple pulmonary emboli in 2003.  Per patient report, hypercoagulable work-up was negative.  She was on Coumadin x 6 months.  She has a history of easy bruising and heavy menses.  She has an IUD.  Symptomatically, she has been fatigued for the past 2 months.  She denies any melena, hematochezia or hematuria.  Exam is stable.  Plan: 1.   Labs today:  urine pregnancy test (asecondary to IV iron administration). 2.   Iron deficiency  Review interval history.  Hematocrit 38.9.  Hemoglobin 12.8.  MCV 85.  Ferritin was 13.  Iron saturation 7%  Patient symptomatic with fatigue, ice pica, and restless legs.  Discuss plans to replete iron stores.  Ferritin goal 100.  Discuss plan for future IV iron if ferritin < 30 to prevent recurrent  symptoms.  Venofer premeds:  Tylenol 650 mg, Benadryl 50 mg, and Decadron 10 mg secondary to history of reaction. 3.   B12 deficiency  B12 was 323 on 10/14/2018.  B12 today and monthly. 4.   RTC in 6 weeks for labs (CCB with diff, ferritin). 5.   RTC in 3 months for MD assessment, labs (CBC with diff, ferritin - day before), and +/- Venofer.   I discussed the assessment and treatment plan with the patient.  The patient was provided an opportunity to ask questions and all were answered.  The patient agreed with the plan and demonstrated an understanding of the instructions.  The patient was advised to call back or seek an in person evaluation if the symptoms worsen or if the condition fails to improve as anticipated.    Rosey BathMelissa C Corcoran, MD  10/26/2018, 8:43 AM

## 2018-10-27 ENCOUNTER — Inpatient Hospital Stay: Payer: BLUE CROSS/BLUE SHIELD

## 2018-10-27 VITALS — BP 113/64 | HR 70 | Temp 97.4°F | Resp 17

## 2018-10-27 DIAGNOSIS — E538 Deficiency of other specified B group vitamins: Secondary | ICD-10-CM | POA: Diagnosis not present

## 2018-10-27 DIAGNOSIS — D509 Iron deficiency anemia, unspecified: Secondary | ICD-10-CM | POA: Diagnosis not present

## 2018-10-27 MED ORDER — SODIUM CHLORIDE 0.9 % IV SOLN
200.0000 mg | Freq: Once | INTRAVENOUS | Status: AC
  Administered 2018-10-27: 200 mg via INTRAVENOUS
  Filled 2018-10-27: qty 10

## 2018-10-27 MED ORDER — DIPHENHYDRAMINE HCL 25 MG PO CAPS
50.0000 mg | ORAL_CAPSULE | Freq: Once | ORAL | Status: AC
  Administered 2018-10-27: 50 mg via ORAL

## 2018-10-27 MED ORDER — DEXAMETHASONE SODIUM PHOSPHATE 10 MG/ML IJ SOLN
10.0000 mg | Freq: Once | INTRAMUSCULAR | Status: AC
  Administered 2018-10-27: 10 mg via INTRAVENOUS
  Filled 2018-10-27: qty 1

## 2018-10-27 MED ORDER — SODIUM CHLORIDE 0.9 % IV SOLN
Freq: Once | INTRAVENOUS | Status: AC
  Administered 2018-10-27: 15:00:00 via INTRAVENOUS
  Filled 2018-10-27: qty 250

## 2018-10-27 MED ORDER — ACETAMINOPHEN 325 MG PO TABS
650.0000 mg | ORAL_TABLET | Freq: Once | ORAL | Status: AC
  Administered 2018-10-27: 650 mg via ORAL

## 2018-10-27 NOTE — Progress Notes (Signed)
Received premedications for her IV Venofer infusion this afternoon. Patient tolerated infusion well.

## 2018-11-03 ENCOUNTER — Inpatient Hospital Stay: Payer: BLUE CROSS/BLUE SHIELD | Attending: Hematology and Oncology

## 2018-11-03 VITALS — BP 110/74 | HR 59 | Temp 98.2°F | Resp 20

## 2018-11-03 DIAGNOSIS — M7989 Other specified soft tissue disorders: Secondary | ICD-10-CM | POA: Diagnosis not present

## 2018-11-03 DIAGNOSIS — Z86711 Personal history of pulmonary embolism: Secondary | ICD-10-CM | POA: Insufficient documentation

## 2018-11-03 DIAGNOSIS — E538 Deficiency of other specified B group vitamins: Secondary | ICD-10-CM | POA: Insufficient documentation

## 2018-11-03 DIAGNOSIS — D509 Iron deficiency anemia, unspecified: Secondary | ICD-10-CM | POA: Diagnosis not present

## 2018-11-03 DIAGNOSIS — Z9884 Bariatric surgery status: Secondary | ICD-10-CM | POA: Insufficient documentation

## 2018-11-03 MED ORDER — IRON SUCROSE 20 MG/ML IV SOLN: 200.0000 mg | Freq: Once | INTRAVENOUS | Status: DC

## 2018-11-03 MED ORDER — SODIUM CHLORIDE 0.9 % IV SOLN
Freq: Once | INTRAVENOUS | Status: AC
  Administered 2018-11-03: 15:00:00 via INTRAVENOUS
  Filled 2018-11-03: qty 250

## 2018-11-03 MED ORDER — DIPHENHYDRAMINE HCL 25 MG PO CAPS
50.0000 mg | ORAL_CAPSULE | Freq: Once | ORAL | Status: AC
  Administered 2018-11-03: 50 mg via ORAL
  Filled 2018-11-03: qty 2

## 2018-11-03 MED ORDER — SODIUM CHLORIDE 0.9 % IV SOLN
200.0000 mg | Freq: Once | INTRAVENOUS | Status: AC
  Administered 2018-11-03: 200 mg via INTRAVENOUS
  Filled 2018-11-03: qty 10

## 2018-11-03 MED ORDER — DEXAMETHASONE SODIUM PHOSPHATE 10 MG/ML IJ SOLN
10.0000 mg | Freq: Once | INTRAMUSCULAR | Status: AC
  Administered 2018-11-03: 10 mg via INTRAVENOUS
  Filled 2018-11-03: qty 1

## 2018-11-03 MED ORDER — ACETAMINOPHEN 325 MG PO TABS
650.0000 mg | ORAL_TABLET | Freq: Once | ORAL | Status: AC
  Administered 2018-11-03: 650 mg via ORAL
  Filled 2018-11-03: qty 2

## 2018-11-03 MED ORDER — SODIUM CHLORIDE 0.9 % IV SOLN: 200.0000 mg | Freq: Once | INTRAVENOUS | Status: DC

## 2018-11-10 ENCOUNTER — Inpatient Hospital Stay: Payer: BLUE CROSS/BLUE SHIELD

## 2018-11-10 VITALS — BP 106/69 | HR 61 | Temp 98.4°F | Resp 18

## 2018-11-10 DIAGNOSIS — D509 Iron deficiency anemia, unspecified: Secondary | ICD-10-CM | POA: Diagnosis not present

## 2018-11-10 DIAGNOSIS — Z86711 Personal history of pulmonary embolism: Secondary | ICD-10-CM | POA: Diagnosis not present

## 2018-11-10 DIAGNOSIS — M7989 Other specified soft tissue disorders: Secondary | ICD-10-CM | POA: Diagnosis not present

## 2018-11-10 DIAGNOSIS — E538 Deficiency of other specified B group vitamins: Secondary | ICD-10-CM | POA: Diagnosis not present

## 2018-11-10 DIAGNOSIS — Z9884 Bariatric surgery status: Secondary | ICD-10-CM | POA: Diagnosis not present

## 2018-11-10 MED ORDER — SODIUM CHLORIDE 0.9 % IV SOLN
Freq: Once | INTRAVENOUS | Status: AC
  Administered 2018-11-10: 15:00:00 via INTRAVENOUS
  Filled 2018-11-10: qty 250

## 2018-11-10 MED ORDER — ACETAMINOPHEN 325 MG PO TABS
650.0000 mg | ORAL_TABLET | Freq: Once | ORAL | Status: AC
  Administered 2018-11-10: 650 mg via ORAL
  Filled 2018-11-10: qty 2

## 2018-11-10 MED ORDER — DEXAMETHASONE SODIUM PHOSPHATE 10 MG/ML IJ SOLN
10.0000 mg | Freq: Once | INTRAMUSCULAR | Status: AC
  Administered 2018-11-10: 10 mg via INTRAVENOUS
  Filled 2018-11-10: qty 1

## 2018-11-10 MED ORDER — DIPHENHYDRAMINE HCL 25 MG PO CAPS
50.0000 mg | ORAL_CAPSULE | Freq: Once | ORAL | Status: AC
  Administered 2018-11-10: 50 mg via ORAL
  Filled 2018-11-10: qty 2

## 2018-11-10 MED ORDER — SODIUM CHLORIDE 0.9 % IV SOLN
200.0000 mg | Freq: Once | INTRAVENOUS | Status: AC
  Administered 2018-11-10: 200 mg via INTRAVENOUS
  Filled 2018-11-10: qty 10

## 2018-11-10 NOTE — Patient Instructions (Signed)
Iron Sucrose injection What is this medicine? IRON SUCROSE (AHY ern SOO krohs) is an iron complex. Iron is used to make healthy red blood cells, which carry oxygen and nutrients throughout the body. This medicine is used to treat iron deficiency anemia in people with chronic kidney disease. This medicine may be used for other purposes; ask your health care provider or pharmacist if you have questions. COMMON BRAND NAME(S): Venofer What should I tell my health care provider before I take this medicine? They need to know if you have any of these conditions: -anemia not caused by low iron levels -heart disease -high levels of iron in the blood -kidney disease -liver disease -an unusual or allergic reaction to iron, other medicines, foods, dyes, or preservatives -pregnant or trying to get pregnant -breast-feeding How should I use this medicine? This medicine is for infusion into a vein. It is given by a health care professional in a hospital or clinic setting. Talk to your pediatrician regarding the use of this medicine in children. While this drug may be prescribed for children as young as 2 years for selected conditions, precautions do apply. Overdosage: If you think you have taken too much of this medicine contact a poison control center or emergency room at once. NOTE: This medicine is only for you. Do not share this medicine with others. What if I miss a dose? It is important not to miss your dose. Call your doctor or health care professional if you are unable to keep an appointment. What may interact with this medicine? Do not take this medicine with any of the following medications: -deferoxamine -dimercaprol -other iron products This medicine may also interact with the following medications: -chloramphenicol -deferasirox This list may not describe all possible interactions. Give your health care provider a list of all the medicines, herbs, non-prescription drugs, or dietary  supplements you use. Also tell them if you smoke, drink alcohol, or use illegal drugs. Some items may interact with your medicine. What should I watch for while using this medicine? Visit your doctor or healthcare professional regularly. Tell your doctor or healthcare professional if your symptoms do not start to get better or if they get worse. You may need blood work done while you are taking this medicine. You may need to follow a special diet. Talk to your doctor. Foods that contain iron include: whole grains/cereals, dried fruits, beans, or peas, leafy green vegetables, and organ meats (liver, kidney). What side effects may I notice from receiving this medicine? Side effects that you should report to your doctor or health care professional as soon as possible: -allergic reactions like skin rash, itching or hives, swelling of the face, lips, or tongue -breathing problems -changes in blood pressure -cough -fast, irregular heartbeat -feeling faint or lightheaded, falls -fever or chills -flushing, sweating, or hot feelings -joint or muscle aches/pains -seizures -swelling of the ankles or feet -unusually weak or tired Side effects that usually do not require medical attention (report to your doctor or health care professional if they continue or are bothersome): -diarrhea -feeling achy -headache -irritation at site where injected -nausea, vomiting -stomach upset -tiredness This list may not describe all possible side effects. Call your doctor for medical advice about side effects. You may report side effects to FDA at 1-800-FDA-1088. Where should I keep my medicine? This drug is given in a hospital or clinic and will not be stored at home. NOTE: This sheet is a summary. It may not cover all possible information. If   you have questions about this medicine, talk to your doctor, pharmacist, or health care provider.  2019 Elsevier/Gold Standard (2011-06-25 17:14:35)  

## 2018-11-14 ENCOUNTER — Encounter: Payer: BLUE CROSS/BLUE SHIELD | Admitting: Nurse Practitioner

## 2018-11-14 ENCOUNTER — Telehealth: Payer: Self-pay

## 2018-11-14 ENCOUNTER — Telehealth: Payer: Self-pay | Admitting: *Deleted

## 2018-11-14 NOTE — Telephone Encounter (Signed)
To inform the patient that she can com ein to our [facility] or she will need to contact her PCP. I was able to leave a message and waiting for the patient to return the call.

## 2018-11-14 NOTE — Telephone Encounter (Signed)
  Please call patient back.  I thought she was being seen today.  M

## 2018-11-14 NOTE — Telephone Encounter (Addendum)
Patient mother has called concerned that patient cannot be seen before 36 tomorrow and her hand is hurting where she got her iron infusion last week. Her appointment is with her PCP at 62 tomorrow and mother is requesting a call back

## 2018-11-14 NOTE — Telephone Encounter (Signed)
Returned call to [employer] as requested.  Message left requesting return call from [facility].   Provided [phone]; [facility]  "I thought I was calling them.  This was the phone number on the internet."

## 2018-11-14 NOTE — Telephone Encounter (Signed)
-----   Message from Verlee Monte, NP sent at 11/14/2018 12:12 PM EST ----- Regarding: FW: pain Contact: [phone] Will need to be seen by [facility] or PCP. In the absence of an extravasation, she should not be having these symptoms.   Judie Grieve  ----- Message ----- From: Reggy Eye Sent: 11/14/2018  10:28 AM EST To: Verlee Monte, NP, Rosey Bath, MD Subject: pain                                           This pt is having a lot of pain in the site where her infusion was given. She said her veins are hurting all the way up her arm. Her forearm is swollen and painful. She has taken tylenol and it helps a little. Please call pt.

## 2018-11-15 ENCOUNTER — Other Ambulatory Visit: Payer: Self-pay

## 2018-11-15 ENCOUNTER — Ambulatory Visit
Admission: RE | Admit: 2018-11-15 | Discharge: 2018-11-15 | Disposition: A | Discharge status: Home / Self Care | Payer: BLUE CROSS/BLUE SHIELD | Source: Ambulatory Visit | Attending: Nurse Practitioner | Admitting: Nurse Practitioner

## 2018-11-15 ENCOUNTER — Encounter: Payer: Self-pay | Admitting: Nurse Practitioner

## 2018-11-15 ENCOUNTER — Other Ambulatory Visit: Payer: Self-pay | Admitting: Nurse Practitioner

## 2018-11-15 ENCOUNTER — Inpatient Hospital Stay (HOSPITAL_BASED_OUTPATIENT_CLINIC_OR_DEPARTMENT_OTHER): Payer: BLUE CROSS/BLUE SHIELD | Admitting: Nurse Practitioner

## 2018-11-15 ENCOUNTER — Ambulatory Visit: Payer: BLUE CROSS/BLUE SHIELD | Admitting: Family Medicine

## 2018-11-15 VITALS — BP 120/79 | HR 71 | Temp 97.3°F

## 2018-11-15 DIAGNOSIS — Z86711 Personal history of pulmonary embolism: Secondary | ICD-10-CM

## 2018-11-15 DIAGNOSIS — Z9884 Bariatric surgery status: Secondary | ICD-10-CM | POA: Diagnosis not present

## 2018-11-15 DIAGNOSIS — D509 Iron deficiency anemia, unspecified: Secondary | ICD-10-CM | POA: Diagnosis not present

## 2018-11-15 DIAGNOSIS — E538 Deficiency of other specified B group vitamins: Secondary | ICD-10-CM | POA: Diagnosis not present

## 2018-11-15 DIAGNOSIS — M7989 Other specified soft tissue disorders: Secondary | ICD-10-CM

## 2018-11-15 DIAGNOSIS — R600 Localized edema: Secondary | ICD-10-CM | POA: Diagnosis not present

## 2018-11-15 MED ORDER — SULFAMETHOXAZOLE-TRIMETHOPRIM 800-160 MG PO TABS: 1.0000 | ORAL_TABLET | Freq: Two times a day (BID) | ORAL | 0 refills | Status: AC

## 2018-11-15 NOTE — Patient Instructions (Addendum)
Prescription sent to pharmacy. Ultrasound scheduled as we discussed. Please let me know if symptoms don't improve or worsen over the next few days. It was a pleasure meeting you today and thank you for allowing me to participate in your care. -Consuello Masse, NP  Cellulitis, Adult  Cellulitis is a skin infection. The infected area is often warm, red, swollen, and sore. It occurs most often in the arms and lower legs. It is very important to get treated for this condition. What are the causes? This condition is caused by bacteria. The bacteria enter through a break in the skin, such as a cut, burn, insect bite, open sore, or crack. What increases the risk? This condition is more likely to occur in people who:  Have a weak body defense system (immune system).  Have open cuts, burns, bites, or scrapes on the skin.  Are older than 39 years of age.  Have a blood sugar problem (diabetes).  Have a long-lasting (chronic) liver disease (cirrhosis) or kidney disease.  Are very overweight (obese).  Have a skin problem, such as: ? Itchy rash (eczema). ? Slow movement of blood in the veins (venous stasis). ? Fluid buildup below the skin (edema).  Have been treated with high-energy rays (radiation).  Use IV drugs. What are the signs or symptoms? Symptoms of this condition include:  Skin that is: ? Red. ? Streaking. ? Spotting. ? Swollen. ? Sore or painful when you touch it. ? Warm.  A fever.  Chills.  Blisters. How is this diagnosed? This condition is diagnosed based on:  Medical history.  Physical exam.  Blood tests.  Imaging tests. How is this treated? Treatment for this condition may include:  Medicines to treat infections or allergies.  Home care, such as: ? Rest. ? Placing cold or warm cloths (compresses) on the skin.  Hospital care, if the condition is very bad. Follow these instructions at home: Medicines  Take over-the-counter and prescription medicines  only as told by your doctor.  If you were prescribed an antibiotic medicine, take it as told by your doctor. Do not stop taking it even if you start to feel better. General instructions   Drink enough fluid to keep your pee (urine) pale yellow.  Do not touch or rub the infected area.  Raise (elevate) the infected area above the level of your heart while you are sitting or lying down.  Place cold or warm cloths on the area as told by your doctor.  Keep all follow-up visits as told by your doctor. This is important. Contact a doctor if:  You have a fever.  You do not start to get better after 1-2 days of treatment.  Your bone or joint under the infected area starts to hurt after the skin has healed.  Your infection comes back. This can happen in the same area or another area.  You have a swollen bump in the area.  You have new symptoms.  You feel ill and have muscle aches and pains. Get help right away if:  Your symptoms get worse.  You feel very sleepy.  You throw up (vomit) or have watery poop (diarrhea) for a long time.  You see red streaks coming from the area.  Your red area gets larger.  Your red area turns dark in color. These symptoms may represent a serious problem that is an emergency. Do not wait to see if the symptoms will go away. Get medical help right away. Call your local emergency  services (911 in the [place] Do not drive yourself to the hospital. Summary  Cellulitis is a skin infection. The area is often warm, red, swollen, and sore.  This condition is treated with medicines, rest, and cold and warm cloths.  Take all medicines only as told by your doctor.  Tell your doctor if symptoms do not start to get better after 1-2 days of treatment. This information is not intended to replace advice given to you by your health care provider. Make sure you discuss any questions you have with your health care provider. Document Released: 03/02/2008 Document  Revised: 02/03/2018 Document Reviewed: 02/03/2018 Elsevier Interactive Patient Education  2019 [employer].

## 2018-11-15 NOTE — Progress Notes (Signed)
Symptom Management Clinic [facility]  Telephone:([phone] [phone]  Patient Care Team: Dorcas Carrow, DO as PCP - General (Family Medicine)   Name of the patient: Sharon Gibson  [id number]  1981/06/29   Date of visit: 11/15/18  Diagnosis-iron and B12 deficiency  Chief complaint/ Reason for visit-hand and arm swelling  Heme history:  Sharon Gibson, 38 year old female, B12 and iron deficiency anemia post gastric bypass surgery in 2004.  She has received IV iron of different preparations x 5 (in [place], and [place] with reactions requiring Benadryl and steroids).   Labs on 03/20/2016 revealed a normal hematocrit but iron deficiency (ferritin 8).  B12 was 185 (low).  She began B12 on 04/07/2016.  She received Venofer 200 mg IV with premedications (Tylenol, Benadryl, and Decadron) on 05/13/2016.  She had side effects of dizziness, lightheadedness, low blood pressure, excess sweating, aches, and pains, nausea, and headache after her infusion.  She has B12 deficiency.  She has a history of multiple pulmonary emboli in 2003.  Per patient report, hypercoagulable work-up was negative.  She was on Coumadin x 6 months.  She has a history of easy bruising and heavy menses.  She has an IUD.  Interval history-Marc Gibson, 38 year old female patient with above history of B12 and iron deficiency anemia, presents to symptom management clinic for complaints of left hand and forearm swelling that started approximately 5 days ago following infusion of Venofer and have persisted since that time.  She describes symptoms as pain, swelling, redness, limited mobility of left hand radiating up forearm.  Had IV in left hand for infusion.  Did not report pain during infusion and symptoms started within 1 to 2 hours after completion of infusion.  She has taken Tylenol at home with some improvement.  Rates  pain 2 of 10.  Denies fever or chills.  She has history of multiple pulmonary emboli in 2003 with reported negative hypercoagulable work-up and completed 6 months of Coumadin following.  Denies history of blood clots since that time.  ECOG FS:1 - Symptomatic but completely ambulatory  Review of systems- Review of Systems  Constitutional: Negative for chills, fever and malaise/fatigue.  HENT: Negative.   Eyes: Negative.   Respiratory: Negative.  Negative for shortness of breath.   Cardiovascular: Negative.  Negative for chest pain and palpitations.  Gastrointestinal: Negative.   Genitourinary: Negative.   Musculoskeletal: Negative.   Skin:       Redness, swelling, pain of left hand, wrist, and forearm  Neurological: Negative.   Endo/Heme/Allergies: Negative.   Psychiatric/Behavioral: Negative.       Allergies  Allergen Reactions  . Penicillins Hives    Has patient had a PCN reaction causing immediate rash, facial/tongue/throat swelling, SOB or lightheadedness with hypotension: yes Has patient had a PCN reaction causing severe rash involving mucus membranes or skin necrosis: no Has patient had a PCN reaction that required hospitalization: yes Has patient had a PCN reaction occurring within the last 10 years: no If all of the above answers are "NO", then may proceed with Cephalosporin use.   . Azithromycin   . Other     Narcotics- Burning in the stomach (whether PO or IV)  . Strawberry (Diagnostic) Swelling    AND WALNUTS-TONGUE SWELLING    Past Medical History:  Diagnosis Date  . Anxiety   . Chicken pox   . Depression   . Eating disorder    Has had residential treatment  and hospitalization previously.  [facility]. Heart murmur    as a child  . History of anemia    RECIEVES IRON INFUSIONS YEARLY  . History of pulmonary embolus (PE) 2002   bilaterally-[facility]-w/u negative per pt  . Hypothyroidism    no meds currently  . PTSD (post-traumatic stress disorder)      Past Surgical History:  Procedure Laterality Date  . CHOLECYSTECTOMY  2003  . EXCISION OF ENDOMETRIOMA    . GASTRIC BYPASS  2003  . LAPAROSCOPIC OVARIAN CYSTECTOMY Left 05/10/2018   Procedure: LAPAROSCOPIC OVARIAN CYSTECTOMY;  Surgeon: Natale MilchSchuman, Christanna R, MD;  Location: [facility] ORS;  Service: Gynecology;  Laterality: Left;    Social History   Socioeconomic History  . Marital status: Single    Spouse name: Not on file  . Number of children: Not on file  . Years of education: Not on file  . Highest education level: Not on file  Occupational History  . Not on file  Social Needs  . Financial resource strain: Not on file  . Food insecurity:    Worry: Not on file    Inability: Not on file  . Transportation needs:    Medical: Not on file    Non-medical: Not on file  Tobacco Use  . Smoking status: Never Smoker  . Smokeless tobacco: Never Used  Substance and Sexual Activity  . Alcohol use: Yes    Alcohol/week: 0.0 - 1.0 standard drinks    Comment: rare  . Drug use: No  . Sexual activity: Yes    Birth control/protection: I.U.D.    Comment: Mirena   Lifestyle  . Physical activity:    Days per week: 7 days    Minutes per session: 60 min  . Stress: Very much  Relationships  . Social connections:    Talks on phone: Not on file    Gets together: Not on file    Attends religious service: Not on file    Active member of club or organization: Not on file    Attends meetings of clubs or organizations: Not on file    Relationship status: Not on file  . Intimate partner violence:    Fear of current or ex partner: Not on file    Emotionally abused: Not on file    Physically abused: Not on file    Forced sexual activity: Not on file  Other Topics Concern  . Not on file  Social History Narrative  . Not on file    Family History  Problem Relation Age of Onset  . Diabetes Father   . Anxiety disorder Father   . Prostate cancer Paternal Grandfather   . Alzheimer's disease  Maternal Grandmother   . Alzheimer's disease Maternal Grandfather   . Lung cancer Paternal Grandmother     Current Outpatient Medications:  .  clonazePAM (KLONOPIN) 0.5 MG tablet, Take 1 tablet (0.5 mg total) by mouth 2 (two) times daily as needed for anxiety., Disp: 60 tablet, Rfl: 3 .  levonorgestrel (MIRENA) 20 MCG/24HR IUD, 1 each by Intrauterine route once., Disp: , Rfl:  .  levothyroxine (SYNTHROID, LEVOTHROID) 50 MCG tablet, Take 1 tablet (50 mcg total) by mouth daily., Disp: 30 tablet, Rfl: 3 .  Multiple Vitamin (MULTIVITAMIN) tablet, Take 1 tablet by mouth daily., Disp: , Rfl:  .  sertraline (ZOLOFT) 100 MG tablet, Take 2 tablets (200 mg total) by mouth at bedtime., Disp: 180 tablet, Rfl: 1 .  Vitamin D, Ergocalciferol, (DRISDOL) 1.25 MG (  50000 UT) CAPS capsule, Take 1 capsule (50,000 Units total) by mouth every 7 (seven) days., Disp: 12 capsule, Rfl: 3  Physical exam:  Vitals:   11/15/18 1027  BP: 120/79  Pulse: 71  Temp: (!) 97.3 F (36.3 C)  TempSrc: Tympanic   Physical Exam Constitutional:      General: She is not in acute distress.    Appearance: She is not ill-appearing.  HENT:     Head: Normocephalic and atraumatic.  Cardiovascular:     Rate and Rhythm: Normal rate and regular rhythm.     Pulses: Normal pulses.  Pulmonary:     Effort: Pulmonary effort is normal.     Breath sounds: Normal breath sounds.  Musculoskeletal:     Right upper arm: She exhibits tenderness and swelling.     Left upper arm: She exhibits no tenderness and no swelling.     Right forearm: She exhibits tenderness and swelling.     Left forearm: She exhibits no tenderness and no swelling.     Right hand: She exhibits tenderness and swelling.     Left hand: She exhibits no tenderness and no swelling.     Comments: Diffusely pink slightly greater than usual skin tone from left back of hand hand extending up arm. Slightly warm to touch. Tender. ROM limited d/t pain.   Neurological:     Mental  Status: She is alert.      CMP Latest Ref Rng & Units 10/14/2018  Glucose 65 - 99 mg/dL 244(Q)  BUN 6 - 20 mg/dL 20  Creatinine 2.86 - 3.81 mg/dL 7.71  Sodium 165 - 790 mmol/L 139  Potassium 3.5 - 5.2 mmol/L 4.4  Chloride 96 - 106 mmol/L 101  CO2 20 - 29 mmol/L 22  Calcium 8.7 - 10.2 mg/dL 9.5  Total Protein 6.0 - 8.5 g/dL 6.9  Total Bilirubin 0.0 - 1.2 mg/dL <3.8  Alkaline Phos 39 - 117 IU/L 84  AST 0 - 40 IU/L 19  ALT 0 - 32 IU/L 16   CBC Latest Ref Rng & Units 10/14/2018  WBC 3.4 - 10.8 x10E3/uL 9.5  Hemoglobin 11.1 - 15.9 g/dL 33.3  Hematocrit 83.2 - 46.6 % 38.9  Platelets 150 - 450 x10E3/uL 331    No images are attached to the encounter.  No results found.  Assessment and plan- Patient is a 38 y.o. female diagnosed with iron deficiency and B12 anemia who presents to [facility]om Management Clinic for arm swelling post iron infusion.   1. Arm Swelling- Ultrasound negative for DVT (prior history of PE). Suspect cellulitis possibly related to iron infusion vs infiltration. Start bactrim ds bid x 5 days. Tylenol for pain. Elevation. Alternate warm and cool compresses for comfort.   rtc if symptoms persist or worsen over the next few days.    Visit Diagnosis 1. Left arm swelling   2. Iron deficiency anemia, unspecified iron deficiency anemia type     Patient expressed understanding and was in agreement with this plan. She also understands that She can call clinic at any time with any questions, concerns, or complaints.   Thank you for allowing me to participate in the care of this very pleasant patient.   Consuello Masse, DNP, AGNP-C [facility] [phone] (work cell) [phone] (office)  CC: Dr. Merlene Pulling

## 2018-11-17 ENCOUNTER — Encounter: Payer: Self-pay | Admitting: Nurse Practitioner

## 2018-11-21 ENCOUNTER — Telehealth: Payer: Self-pay | Admitting: *Deleted

## 2018-11-21 NOTE — Telephone Encounter (Signed)
Suspect it may take time for symptoms to improve however, if symptoms are not improved from previous or worse, I'd like to re-evaluate her.

## 2018-11-21 NOTE — Telephone Encounter (Signed)
They have improved, just not gone. Does she need to wait a bit longer or do you want to see her now?

## 2018-11-21 NOTE — Telephone Encounter (Signed)
Patient called stating she completed the antibiotics and is still having mild symptoms. Please advise

## 2018-11-21 NOTE — Telephone Encounter (Signed)
Call returned to patient and advised to wait a week or so as it needs time to heal completley. Patient in agreement with this and will call back if it worsens

## 2018-12-07 ENCOUNTER — Inpatient Hospital Stay: Payer: BLUE CROSS/BLUE SHIELD | Attending: Hematology and Oncology

## 2018-12-07 ENCOUNTER — Inpatient Hospital Stay: Payer: BLUE CROSS/BLUE SHIELD

## 2018-12-07 ENCOUNTER — Other Ambulatory Visit: Payer: Self-pay

## 2018-12-07 DIAGNOSIS — D509 Iron deficiency anemia, unspecified: Secondary | ICD-10-CM | POA: Diagnosis not present

## 2018-12-07 DIAGNOSIS — E538 Deficiency of other specified B group vitamins: Secondary | ICD-10-CM | POA: Diagnosis not present

## 2018-12-07 DIAGNOSIS — D508 Other iron deficiency anemias: Secondary | ICD-10-CM

## 2018-12-07 DIAGNOSIS — K9589 Other complications of other bariatric procedure: Secondary | ICD-10-CM

## 2018-12-07 LAB — CBC WITH DIFFERENTIAL/PLATELET
Abs Immature Granulocytes: 0.02 10*3/uL (ref 0.00–0.07)
Basophils Absolute: 0.1 10*3/uL (ref 0.0–0.1)
Basophils Relative: 1 %
Eosinophils Absolute: 0.2 10*3/uL (ref 0.0–0.5)
Eosinophils Relative: 3 %
HCT: 44.7 % (ref 36.0–46.0)
Hemoglobin: 13.5 g/dL (ref 12.0–15.0)
Immature Granulocytes: 0 %
Lymphocytes Relative: 29 %
Lymphs Abs: 2 10*3/uL (ref 0.7–4.0)
MCH: 28.5 pg (ref 26.0–34.0)
MCHC: 30.2 g/dL (ref 30.0–36.0)
MCV: 94.3 fL (ref 80.0–100.0)
Monocytes Absolute: 0.5 10*3/uL (ref 0.1–1.0)
Monocytes Relative: 8 %
Neutro Abs: 4 10*3/uL (ref 1.7–7.7)
Neutrophils Relative %: 59 %
Platelets: 246 10*3/uL (ref 150–400)
RBC: 4.74 MIL/uL (ref 3.87–5.11)
RDW: 15 % (ref 11.5–15.5)
WBC: 6.7 10*3/uL (ref 4.0–10.5)
nRBC: 0 % (ref 0.0–0.2)

## 2018-12-07 LAB — FERRITIN: Ferritin: 53 ng/mL (ref 11–307)

## 2018-12-07 MED ORDER — CYANOCOBALAMIN 1000 MCG/ML IJ SOLN
1000.0000 ug | Freq: Once | INTRAMUSCULAR | Status: AC
  Administered 2018-12-07: 1000 ug via INTRAMUSCULAR

## 2018-12-11 ENCOUNTER — Other Ambulatory Visit: Payer: Self-pay

## 2018-12-11 ENCOUNTER — Emergency Department: Payer: BLUE CROSS/BLUE SHIELD

## 2018-12-11 ENCOUNTER — Emergency Department
Admission: EM | Admit: 2018-12-11 | Discharge: 2018-12-12 | Disposition: A | Discharge status: Home / Self Care | Payer: BLUE CROSS/BLUE SHIELD | Attending: Emergency Medicine | Admitting: Emergency Medicine

## 2018-12-11 ENCOUNTER — Encounter: Payer: Self-pay | Admitting: Emergency Medicine

## 2018-12-11 DIAGNOSIS — R52 Pain, unspecified: Secondary | ICD-10-CM | POA: Diagnosis not present

## 2018-12-11 DIAGNOSIS — Z79899 Other long term (current) drug therapy: Secondary | ICD-10-CM | POA: Diagnosis not present

## 2018-12-11 DIAGNOSIS — R0602 Shortness of breath: Secondary | ICD-10-CM | POA: Diagnosis not present

## 2018-12-11 DIAGNOSIS — R091 Pleurisy: Secondary | ICD-10-CM | POA: Insufficient documentation

## 2018-12-11 DIAGNOSIS — R079 Chest pain, unspecified: Secondary | ICD-10-CM

## 2018-12-11 DIAGNOSIS — E039 Hypothyroidism, unspecified: Secondary | ICD-10-CM | POA: Insufficient documentation

## 2018-12-11 DIAGNOSIS — R0789 Other chest pain: Secondary | ICD-10-CM | POA: Diagnosis not present

## 2018-12-11 LAB — CBC
HEMATOCRIT: 42 % (ref 36.0–46.0)
Hemoglobin: 13.3 g/dL (ref 12.0–15.0)
MCH: 28.1 pg (ref 26.0–34.0)
MCHC: 31.7 g/dL (ref 30.0–36.0)
MCV: 88.8 fL (ref 80.0–100.0)
Platelets: 299 10*3/uL (ref 150–400)
RBC: 4.73 MIL/uL (ref 3.87–5.11)
RDW: 14.8 % (ref 11.5–15.5)
WBC: 9.4 10*3/uL (ref 4.0–10.5)
nRBC: 0 % (ref 0.0–0.2)

## 2018-12-11 LAB — BASIC METABOLIC PANEL
Anion gap: 7 (ref 5–15)
BUN: 20 mg/dL (ref 6–20)
CO2: 24 mmol/L (ref 22–32)
Calcium: 8.7 mg/dL — ABNORMAL LOW (ref 8.9–10.3)
Chloride: 110 mmol/L (ref 98–111)
Creatinine, Ser: 0.61 mg/dL (ref 0.44–1.00)
GFR calc Af Amer: >60 mL/min (ref >60)
GFR calc non Af Amer: >60 mL/min (ref >60)
Glucose, Bld: 102 mg/dL — ABNORMAL HIGH (ref 70–99)
POTASSIUM: 3.8 mmol/L (ref 3.5–5.1)
Sodium: 141 mmol/L (ref 135–145)

## 2018-12-11 LAB — TROPONIN I: Troponin I: <0.03 ng/mL (ref <0.03)

## 2018-12-11 NOTE — ED Notes (Signed)
Spoke with Dr Alphonzo Lemmings regarding pt's presenting symptoms and history; only cardiac protocols at this time;

## 2018-12-11 NOTE — ED Triage Notes (Addendum)
Pt says she was sick about 10 days ago with cough and runny nose; felt better and returned to work this past week; chest pain/burning to center of her chest has remained; pt says the cold air outside actually made it feel better;  pt talking in complete coherent sentences; adds sore throat; pt with history of PE and iron infusions; pt says about 2 weeks ago she had a reaction in her left arm from the infusion and was diagnosed with cellulitis; she had an [place] of arm performed that was negative for a clot;

## 2018-12-12 ENCOUNTER — Encounter: Payer: Self-pay | Admitting: Radiology

## 2018-12-12 ENCOUNTER — Emergency Department: Payer: BLUE CROSS/BLUE SHIELD

## 2018-12-12 DIAGNOSIS — R0789 Other chest pain: Secondary | ICD-10-CM | POA: Diagnosis not present

## 2018-12-12 MED ORDER — SODIUM CHLORIDE 0.9 % IV BOLUS
1000.0000 mL | Freq: Once | INTRAVENOUS | Status: AC
  Administered 2018-12-12: 1000 mL via INTRAVENOUS

## 2018-12-12 MED ORDER — IOHEXOL 350 MG/ML SOLN
75.0000 mL | Freq: Once | INTRAVENOUS | Status: AC | PRN
  Administered 2018-12-12: 75 mL via INTRAVENOUS

## 2018-12-12 NOTE — ED Provider Notes (Signed)
[facility] Emergency Department Provider Note  ____________________________________________  Time seen: Approximately 3:12 AM  I have reviewed the triage vital signs and the nursing notes.   HISTORY  Chief Complaint Chest Pain    HPI Sharon Gibson is a 38 y.o. female with a past medical history of hypothyroidism, anemia, pulmonary embolism who complains of central chest pain described as sharp, nonradiating, constant for the past week to 10 days.  No shortness of breath diaphoresis or vomiting, not exertional.  It is pleuritic.  Moderate intensity.  Does not feel like prior pulmonary embolism.  Not currently taking any anticoagulants.      Past Medical History:  Diagnosis Date  . Anxiety   . Chicken pox   . Depression   . Eating disorder    Has had residential treatment and hospitalization previously.  Marland Kitchen Heart murmur    as a child  . History of anemia    RECIEVES IRON INFUSIONS YEARLY  . History of pulmonary embolus (PE) 2002   bilaterally-followed by [facility] hematology-w/u negative per pt  . Hypothyroidism    no meds currently  . PTSD (post-traumatic stress disorder)      Patient Active Problem List   Diagnosis Date Noted  . Endometriosis determined by laparoscopy 06/13/2018  . Ovarian cyst 03/01/2018  . Hx pulmonary embolism 02/03/2018  . Generalized anxiety disorder 10/12/2016  . Right knee pain 09/02/2016  . Severe recurrent major depression without psychotic features (HCC) 07/23/2016  . Bulimia nervosa, in partial remission, mild 07/23/2016    Class: Chronic  . Iron deficiency anemia 04/08/2016  . B12 deficiency 04/08/2016  . S/P gastric bypass 03/20/2016  . Eating disorder 03/20/2016  . Insomnia 03/20/2016  . PTSD (post-traumatic stress disorder) 03/20/2016  . History of hypothyroidism 03/20/2016  . PCOS (polycystic ovarian syndrome) 12/14/2014     Past Surgical History:  Procedure Laterality Date  . CHOLECYSTECTOMY  2003   . EXCISION OF ENDOMETRIOMA    . GASTRIC BYPASS  2003  . LAPAROSCOPIC OVARIAN CYSTECTOMY Left 05/10/2018   Procedure: LAPAROSCOPIC OVARIAN CYSTECTOMY;  Surgeon: Natale Milch, MD;  Location: [facility] ORS;  Service: Gynecology;  Laterality: Left;     Prior to Admission medications   Medication Sig Start Date End Date Taking? Authorizing Provider  clonazePAM (KLONOPIN) 0.5 MG tablet Take 1 tablet (0.5 mg total) by mouth 2 (two) times daily as needed for anxiety. 06/14/18  Yes Schuman, Jaquelyn Bitter, MD  levonorgestrel (MIRENA) 20 MCG/24HR IUD 1 each by Intrauterine route once.   Yes [provider]  levothyroxine (SYNTHROID, LEVOTHROID) 50 MCG tablet Take 1 tablet (50 mcg total) by mouth daily. 10/17/18  Yes Johnson, Megan P, DO  Multiple Vitamin (MULTIVITAMIN) tablet Take 1 tablet by mouth daily.   Yes [provider]  sertraline (ZOLOFT) 100 MG tablet Take 2 tablets (200 mg total) by mouth at bedtime. 10/14/18 01/12/19 Yes Johnson, Megan P, DO  Vitamin D, Ergocalciferol, (DRISDOL) 1.25 MG (50000 UT) CAPS capsule Take 1 capsule (50,000 Units total) by mouth every 7 (seven) days. 10/17/18  Yes Johnson, Megan P, DO     Allergies Penicillins; Azithromycin; Other; and Strawberry (diagnostic)   Family History  Problem Relation Age of Onset  . Diabetes Father   . Anxiety disorder Father   . Prostate cancer Paternal Grandfather   . Alzheimer's disease Maternal Grandmother   . Alzheimer's disease Maternal Grandfather   . Lung cancer Paternal Grandmother     Social History Social History  Tobacco Use  . Smoking status: Never Smoker  . Smokeless tobacco: Never Used  Substance Use Topics  . Alcohol use: Yes    Alcohol/week: 0.0 - 1.0 standard drinks    Comment: rare  . Drug use: No    Review of Systems  Constitutional:   No fever or chills.  ENT:   No sore throat. No rhinorrhea. Cardiovascular: Positive as above chest pain without syncope. Respiratory:   No  dyspnea or cough. Gastrointestinal:   Negative for abdominal pain, vomiting and diarrhea.  Musculoskeletal:   Negative for focal pain or swelling All other systems reviewed and are negative except as documented above in ROS and HPI.  ____________________________________________   PHYSICAL EXAM:  VITAL SIGNS: ED Triage Vitals  Enc Vitals Group     BP 12/12/18 0215 (!) 119/56     Pulse Rate 12/12/18 0215 64     Resp 12/12/18 0215 20     Temp 12/12/18 0215 98 F (36.7 C)     Temp Source 12/12/18 0215 Oral     SpO2 12/12/18 0215 99 %     Weight 12/11/18 2141 250 lb (113.4 kg)     Height 12/11/18 2141 5\' 3"  (1.6 m)     Head Circumference --      Peak Flow --      Pain Score 12/11/18 2140 2     Pain Loc --      Pain Edu? --      Excl. in GC? --     Vital signs reviewed, nursing assessments reviewed.   Constitutional:   Alert and oriented. Non-toxic appearance. Eyes:   Conjunctivae are normal. EOMI. PERRL. ENT      Head:   Normocephalic and atraumatic.      Nose:   No congestion/rhinnorhea.       Mouth/Throat:   MMM, no pharyngeal erythema. No peritonsillar mass.       Neck:   No meningismus. Full ROM. Hematological/Lymphatic/Immunilogical:   No cervical lymphadenopathy. Cardiovascular:   RRR. Symmetric bilateral radial and DP pulses.  No murmurs. Cap refill less than 2 seconds. Respiratory:   Normal respiratory effort without tachypnea/retractions. Breath sounds are clear and equal bilaterally. No wheezes/rales/rhonchi. Gastrointestinal:   Soft and nontender. Non distended. There is no CVA tenderness.  No rebound, rigidity, or guarding. Musculoskeletal:   Normal range of motion in all extremities. No joint effusions.  No lower extremity tenderness.  No edema. Neurologic:   Normal speech and language.  Motor grossly intact. No acute focal neurologic deficits are appreciated.  Skin:    Skin is warm, dry and intact. No rash noted.  No petechiae, purpura, or  bullae.  ____________________________________________    LABS (pertinent positives/negatives) (all labs ordered are listed, but only abnormal results are displayed) Labs Reviewed  BASIC METABOLIC PANEL - Abnormal; Notable for the following components:      Result Value   Glucose, Bld 102 (*)    Calcium 8.7 (*)    All other components within normal limits  CBC  TROPONIN I  POC URINE PREG, ED   ____________________________________________   EKG  Interpreted by me Normal sinus rhythm rate of 79, normal axis and intervals.  Normal QRS ST segments and T waves.  ____________________________________________    RADIOLOGY  Dg Chest 2 View  Result Date: 12/11/2018 CLINICAL DATA:  Chest pain EXAM: CHEST - 2 VIEW COMPARISON:  02/06/2018 FINDINGS: Scarring at the left lung base, stable. Heart is normal size. No acute confluent airspace  opacities or effusions. No acute bony abnormality. IMPRESSION: No active cardiopulmonary disease. Electronically Signed   By: Charlett Nose M.D.   On: 12/11/2018 22:33   Ct Angio Chest Pe W And/or Wo Contrast  Result Date: 12/12/2018 CLINICAL DATA:  Pt says she was sick about 10 days ago with cough and runny nose; felt better and returned to work this past week; chest pain/burning to center of her chest has remained; pt says the cold air outside actually made it feel better; pt talking in complete coherent sentences; adds sore throat; pt with history of PE and iron infusions; pt says about 2 weeks ago she had a reaction in her left arm from the infusion and was diagnosed with cellulitis; she had an [place] of arm performed that was negative for a clot EXAM: CT ANGIOGRAPHY CHEST WITH CONTRAST TECHNIQUE: Multidetector CT imaging of the chest was performed using the standard protocol during bolus administration of intravenous contrast. Multiplanar CT image reconstructions and MIPs were obtained to evaluate the vascular anatomy. CONTRAST:  75mL OMNIPAQUE IOHEXOL 350 MG/ML  SOLN COMPARISON:  Current chest radiographs. FINDINGS: Cardiovascular: There is satisfactory opacification of the pulmonary arteries to the segmental level. There is no evidence of a pulmonary embolism. Heart is normal in size and configuration. No pericardial effusion. No coronary artery calcifications. Great vessels are within normal limits. Mediastinum/Nodes: Visualized thyroid is unremarkable. No neck base, axillary, mediastinal or hilar masses or enlarged lymph nodes. Trachea is unremarkable. Small sliding hiatal hernia. Esophagus is unremarkable. Lungs/Pleura: Minimal lung base subsegmental atelectasis. Lungs otherwise clear. No pleural effusion or pneumothorax. Upper Abdomen: Changes from prior gastric surgery and cholecystectomy. No acute findings. Musculoskeletal: No chest wall abnormality. No acute or significant osseous findings. Review of the MIP images confirms the above findings. IMPRESSION: 1. No evidence of a pulmonary embolism. 2. No acute findings.  Lungs essentially clear. Electronically Signed   By: Amie Portland M.D.   On: 12/12/2018 01:31    ____________________________________________   PROCEDURES Procedures  ____________________________________________  DIFFERENTIAL DIAGNOSIS   Pulmonary embolism, pneumonia, pleurisy  CLINICAL IMPRESSION / ASSESSMENT AND PLAN / ED COURSE  Medications ordered in the ED: Medications  sodium chloride 0.9 % bolus 1,000 mL (0 mLs Intravenous Stopped 12/12/18 0233)  iohexol (OMNIPAQUE) 350 MG/ML injection 75 mL (75 mLs Intravenous Contrast Given 12/12/18 0111)    Pertinent labs & imaging results that were available during my care of the patient were reviewed by me and considered in my medical decision making (see chart for details).    Patient presents with pleuritic chest pain for the past 7 to 10 days.  It did start after a recent viral URI syndrome, suggestive of pleurisy.  However, with her history of PE and no other clear explanation  for the symptoms, she is high enough risk to require a CT scan of the chest to rule out pulmonary embolism.  Vital signs are normal.  CT scan is negative.  No evidence of pericardial effusion.  Doubt ACS. Patient can be treated with NSAIDs and outpatient follow-up.  An initial troponin was sent from triage as part as nursing protocol.  Symptoms are noncardiac, I will feel that a cardiac work-up is indicated and would disregard the troponin that was negative without a need of additional testing.     ____________________________________________   FINAL CLINICAL IMPRESSION(S) / ED DIAGNOSES    Final diagnoses:  Nonspecific chest pain  Pleurisy   ED Discharge Orders    None  Portions of this note were generated with dragon dictation software. Dictation errors may occur despite best attempts at proofreading.   Sharman CheekStafford, Reza Crymes, MD 12/12/18 [phone]

## 2018-12-12 NOTE — Discharge Instructions (Addendum)
Your tests today, including blood tests, EKG, and CT scan of the chest, were all okay. Please follow up with your doctor for continued evaluation of these symptoms. Anti-inflammatory medicine, such as ibuprofen or naproxen, may be helpful.

## 2018-12-12 NOTE — ED Notes (Signed)
Peripheral IV discontinued. Catheter intact. No signs of infiltration or redness. Gauze applied to IV site.  Discharge instructions reviewed with patient. Questions fielded by this RN. Patient verbalizes understanding of instructions. Patient discharged home in stable condition per Stafford. No acute distress noted at time of discharge.   

## 2018-12-13 ENCOUNTER — Encounter: Payer: Self-pay | Admitting: Family Medicine

## 2018-12-13 ENCOUNTER — Telehealth: Payer: Self-pay | Admitting: Family Medicine

## 2018-12-13 NOTE — Telephone Encounter (Signed)
Has only been seen in the office 1x needs an appointment for a refill on this.

## 2018-12-13 NOTE — Telephone Encounter (Signed)
Patient scheduled for 3/23 for hospital follow up with Dr Laural Benes

## 2018-12-13 NOTE — Telephone Encounter (Signed)
Copied from CRM [id number]. Topic: Quick Communication - Rx Refill/Question >> Dec 13, 2018 11:33 AM Jaquita Rector A wrote: Medication: clonazePAM (KLONOPIN) 0.5 MG tablet  Patient is all out of medication  Has the patient contacted their pharmacy? Yes.   (Agent: If no, request that the patient contact the pharmacy for the refill.) (Agent: If yes, when and what did the pharmacy advise?)  Preferred Pharmacy (with phone number or street name): [facility] [address] [place] - [address] [phone] (Phone) [phone] (Fax)    Agent: Please be advised that RX refills may take up to 3 business days. We ask that you follow-up with your pharmacy.

## 2018-12-19 ENCOUNTER — Inpatient Hospital Stay: Payer: BLUE CROSS/BLUE SHIELD | Admitting: Family Medicine

## 2018-12-19 ENCOUNTER — Other Ambulatory Visit: Payer: Self-pay

## 2018-12-19 ENCOUNTER — Ambulatory Visit (INDEPENDENT_AMBULATORY_CARE_PROVIDER_SITE_OTHER): Payer: BLUE CROSS/BLUE SHIELD | Admitting: Family Medicine

## 2018-12-19 ENCOUNTER — Encounter: Payer: Self-pay | Admitting: Family Medicine

## 2018-12-19 DIAGNOSIS — R0602 Shortness of breath: Secondary | ICD-10-CM

## 2018-12-19 DIAGNOSIS — F502 Bulimia nervosa: Secondary | ICD-10-CM | POA: Diagnosis not present

## 2018-12-19 DIAGNOSIS — G4700 Insomnia, unspecified: Secondary | ICD-10-CM

## 2018-12-19 DIAGNOSIS — F411 Generalized anxiety disorder: Secondary | ICD-10-CM

## 2018-12-19 DIAGNOSIS — F329 Major depressive disorder, single episode, unspecified: Secondary | ICD-10-CM

## 2018-12-19 DIAGNOSIS — F419 Anxiety disorder, unspecified: Secondary | ICD-10-CM

## 2018-12-19 DIAGNOSIS — F4310 Post-traumatic stress disorder, unspecified: Secondary | ICD-10-CM | POA: Diagnosis not present

## 2018-12-19 DIAGNOSIS — F332 Major depressive disorder, recurrent severe without psychotic features: Secondary | ICD-10-CM

## 2018-12-19 DIAGNOSIS — J9811 Atelectasis: Secondary | ICD-10-CM

## 2018-12-19 MED ORDER — CLONAZEPAM 0.5 MG PO TABS: 0.5000 mg | ORAL_TABLET | Freq: Two times a day (BID) | ORAL | 1 refills | Status: DC | PRN

## 2018-12-19 NOTE — Assessment & Plan Note (Signed)
See discussion under PTSD 

## 2018-12-19 NOTE — Progress Notes (Signed)
LMP 12/04/2018 (Approximate)  TELEPHONE VISIT- NO VITALS TAKEN  Subjective:    Patient ID: Sharon Gibson, female    DOB: Feb 02, 1981, 38 y.o.   MRN: [id number]  HPI: Sharon Gibson is a 38 y.o. female  Chief Complaint  Patient presents with   Depression   Anxiety   ANXIETY/DEPRESSION- Has not seen psychiatry. Did not get a call from them. Feeling very out of control with the present situation with COVID. She had been on the 200mg  of the sertraline, used to be on 300mg  of sertraline- liked that better Duration:exacerbated Anxious mood: yes  Excessive worrying: yes Irritability: yes  Sweating: no Nausea: no Palpitations:no Hyperventilation: no Panic attacks: yes Agoraphobia: no  Obscessions/compulsions: yes Depressed mood: yes Depression screen [facility] 2/9 12/19/2018 10/14/2018  Decreased Interest 3 2  Down, Depressed, Hopeless 3 3  PHQ - 2 Score 6 5  Altered sleeping 3 3  Tired, decreased energy 3 3  Change in appetite 3 3  Feeling bad or failure about yourself  3 3  Trouble concentrating 3 3  Moving slowly or fidgety/restless 0 0  Suicidal thoughts 1 1  PHQ-9 Score 22 21  Difficult doing work/chores Very difficult -   GAD 7 : Generalized Anxiety Score 12/19/2018 10/14/2018  Nervous, Anxious, on Edge 3 3  Control/stop worrying 3 3  Worry too much - different things 3 3  Trouble relaxing 3 3  Restless 3 3  Easily annoyed or irritable 1 2  Afraid - awful might happen 3 2  Total GAD 7 Score 19 19  Anxiety Difficulty Extremely difficult Somewhat difficult   Anhedonia: no Weight changes: no Insomnia: yes   Hypersomnia: no Fatigue/loss of energy: yes Feelings of worthlessness: yes Feelings of guilt: yes Impaired concentration/indecisiveness: yes Suicidal ideations: no  Crying spells: no Recent Stressors/Life Changes: yes   Relationship problems: no   Family stress: yes     Financial stress: yes    Job stress: yes    Recent death/loss: no  SHORTNESS OF  BREATH- about 2+ weeks ago, went to the ER on 12/11/18 Duration: 2.5 weeks Onset: sudden Description of breathing discomfort: hard to catch her breath Severity: moderate Episode duration: near constant- better now Related to exertion: no Cough: no Chest tightness: yes Wheezing: no Fevers: no Chest pain: yes Palpitations: no  Nausea: no Diaphoresis: no Deconditioning: no Status: better  Relevant past medical, surgical, family and social history reviewed and updated as indicated. Interim medical history since our last visit reviewed. Allergies and medications reviewed and updated.  Review of Systems  Constitutional: Negative.   HENT: Negative.   Respiratory: Positive for chest tightness and shortness of breath. Negative for apnea, cough, choking, wheezing and stridor.   Cardiovascular: Negative.   Skin: Negative.   Psychiatric/Behavioral: Positive for decreased concentration. Negative for agitation, behavioral problems, confusion, dysphoric mood, hallucinations, self-injury, sleep disturbance and suicidal ideas. The patient is nervous/anxious. The patient is not hyperactive.     Per HPI unless specifically indicated above     Objective:    LMP 12/04/2018 (Approximate)   Wt Readings from Last 3 Encounters:  12/11/18 250 lb (113.4 kg)  10/26/18 253 lb 3.2 oz (114.8 kg)  06/14/18 252 lb 8 oz (114.5 kg)    Physical Exam Nursing note reviewed.  Constitutional:      Appearance: She is well-developed.  HENT:     Right Ear: Hearing normal.     Left Ear: Hearing normal.  Eyes:     General: Lids  are normal.  Pulmonary:     Effort: Pulmonary effort is normal.     Breath sounds: No stridor.     Comments: Speaking in full sentances Neurological:     Mental Status: She is alert and oriented to person, place, and time. Mental status is at baseline.  Psychiatric:        Mood and Affect: Mood is anxious and depressed. Mood is not elated. Affect is not labile, blunt, flat, angry,  tearful or inappropriate.        Speech: Speech normal.        Behavior: Behavior normal.        Thought Content: Thought content normal.        Judgment: Judgment normal.     Results for orders placed or performed during the hospital encounter of 12/11/18  Basic metabolic panel  Result Value Ref Range   Sodium 141 135 - 145 mmol/L   Potassium 3.8 3.5 - 5.1 mmol/L   Chloride 110 98 - 111 mmol/L   CO2 24 22 - 32 mmol/L   Glucose, Bld 102 (H) 70 - 99 mg/dL   BUN 20 6 - 20 mg/dL   Creatinine, Ser 3.08 0.44 - 1.00 mg/dL   Calcium 8.7 (L) 8.9 - 10.3 mg/dL   GFR calc non Af Amer >60 >60 mL/min   GFR calc Af Amer >60 >60 mL/min   Anion gap 7 5 - 15  CBC  Result Value Ref Range   WBC 9.4 4.0 - 10.5 K/uL   RBC 4.73 3.87 - 5.11 MIL/uL   Hemoglobin 13.3 12.0 - 15.0 g/dL   HCT 65.7 84.6 - 96.2 %   MCV 88.8 80.0 - 100.0 fL   MCH 28.1 26.0 - 34.0 pg   MCHC 31.7 30.0 - 36.0 g/dL   RDW 95.2 84.1 - 32.4 %   Platelets 299 150 - 400 K/uL   nRBC 0.0 0.0 - 0.2 %  Troponin I - ONCE - STAT  Result Value Ref Range   Troponin I <0.03 <0.03 ng/mL      Assessment & Plan:   Problem List Items Addressed This Visit      Other   Eating disorder   Relevant Orders   Ambulatory referral to Psychiatry   Insomnia    See discussion under PTSD      Relevant Orders   Ambulatory referral to Psychiatry   PTSD (post-traumatic stress disorder) - Primary    Not doing great. Anxiety related to recent covid pandemic is making her feel out of control. She is doing OK on the sertraline , would do better on the , but I'm not comfortable prescribing that amount. Needs refill on her klonopin. Has not been able to establish with psychiatry due to issues with the referral. New referral has been placed- she prefers to go [place] rather than [place] due to her job location. Refill of her klonopin given today. Offered Rx for wellbutrin or abilify to help with anxiety- she declined at this time. Continue to monitor  closely.       Relevant Orders   Ambulatory referral to Psychiatry   Severe recurrent major depression without psychotic features [facility])    See discussion under PTSD      Relevant Orders   Ambulatory referral to Psychiatry   Bulimia nervosa, in partial remission, mild    See discussion under PTSD      Generalized anxiety disorder    See discussion under PTSD  Other Visit Diagnoses    SOB (shortness of breath)       Improved. Normal CTA in ER except atelectasis- stable from 01/2018. Will try to get mood under better control, if not sprio/CXR.    Anxiety and depression       Relevant Medications   clonazePAM (KLONOPIN) 0.5 MG tablet   Atelectasis       Discussed results with patient today and reassured her.        Follow up plan: Return in about 4 weeks (around 01/16/2019) for Follow up mood.    This visit was completed via telephone due to the restrictions of the COVID-19 pandemic. All issues as above were discussed and addressed but no physical exam was performed. If it was felt that the patient should be evaluated in the office, they were directed there. The patient verbally consented to this visit. Patient was unable to complete an audio/visual visit due to Technical difficulties. Due to the catastrophic nature of the COVID-19 pandemic, this visit was done through audio contact only.  Location of the patient: work  Location of the provider: work  Those involved with this call:   Provider: Olevia Perches, DO  CMA: Wilhemena Durie, CMA  Front Desk/Registration: Kandice Hams   Time spent on call: 26 minutes on the phone discussing health concerns

## 2018-12-19 NOTE — Assessment & Plan Note (Signed)
Not doing great. Anxiety related to recent covid pandemic is making her feel out of control. She is doing OK on the sertraline 200mg , would do better on the 300mg , but I'm not comfortable prescribing that amount. Needs refill on her klonopin. Has not been able to establish with psychiatry due to issues with the referral. New referral has been placed- she prefers to go [place] rather than [place] due to her job location. Refill of her klonopin given today. Offered Rx for wellbutrin or abilify to help with anxiety- she declined at this time. Continue to monitor closely.

## 2018-12-30 ENCOUNTER — Other Ambulatory Visit: Payer: Self-pay

## 2018-12-30 ENCOUNTER — Ambulatory Visit
Admission: EM | Admit: 2018-12-30 | Discharge: 2018-12-30 | Disposition: A | Discharge status: Home / Self Care | Payer: BLUE CROSS/BLUE SHIELD

## 2018-12-30 ENCOUNTER — Encounter: Payer: Self-pay | Admitting: Emergency Medicine

## 2018-12-30 DIAGNOSIS — R5383 Other fatigue: Secondary | ICD-10-CM

## 2018-12-30 DIAGNOSIS — R197 Diarrhea, unspecified: Secondary | ICD-10-CM

## 2018-12-30 NOTE — ED Triage Notes (Signed)
Patient c/o diarrhea and tiredness that started for the past 3 days.  Patient denies fevers.  Patient also reports HAs for the past 4 days.

## 2018-12-30 NOTE — Discharge Instructions (Signed)
Rest, fluids.  Wash hands well.  No need to worry at this time.  Take care  Dr. Adriana Simas

## 2018-12-30 NOTE — ED Provider Notes (Signed)
[facility]    CSN: [id number] Arrival date & time: 12/30/18  0854  History   Chief Complaint Chief Complaint  Patient presents with  . Diarrhea   HPI 38 year old female presents with multiple complaints.  Patient reports that she has been fatigued for the past 3 days.  She also reports rhinorrhea, diarrhea, headaches.  No documented fever.  She has had some chills.  No reported sick contacts.  No known exacerbating or relieving factors.  Patient is concerned about her symptoms, especially since her mother has had a recent heart attack.  She states that she does not want to get her sick.  No medications or interventions tried.  No other associated symptoms.  No other complaints.  PMH, Surgical Hx, Family Hx, Social History reviewed and updated as below.  Past Medical History:  Diagnosis Date  . Anxiety   . Chicken pox   . Depression   . Eating disorder    Has had residential treatment and hospitalization previously.  Marland Kitchen Heart murmur    as a child  . History of anemia    RECIEVES IRON INFUSIONS YEARLY  . History of pulmonary embolus (PE) 2002   bilaterally-followed by [facility] hematology-w/u negative per pt  . Hypothyroidism    no meds currently  . PTSD (post-traumatic stress disorder)     Patient Active Problem List   Diagnosis Date Noted  . Endometriosis determined by laparoscopy 06/13/2018  . Ovarian cyst 03/01/2018  . Hx pulmonary embolism 02/03/2018  . Generalized anxiety disorder 10/12/2016  . Right knee pain 09/02/2016  . Severe recurrent major depression without psychotic features (HCC) 07/23/2016  . Bulimia nervosa, in partial remission, mild 07/23/2016    Class: Chronic  . Iron deficiency anemia 04/08/2016  . B12 deficiency 04/08/2016  . S/P gastric bypass 03/20/2016  . Eating disorder 03/20/2016  . Insomnia 03/20/2016  . PTSD (post-traumatic stress disorder) 03/20/2016  . History of hypothyroidism 03/20/2016  . PCOS (polycystic ovarian syndrome)  12/14/2014    Past Surgical History:  Procedure Laterality Date  . CHOLECYSTECTOMY  2003  . EXCISION OF ENDOMETRIOMA    . GASTRIC BYPASS  2003  . LAPAROSCOPIC OVARIAN CYSTECTOMY Left 05/10/2018   Procedure: LAPAROSCOPIC OVARIAN CYSTECTOMY;  Surgeon: Natale Milch, MD;  Location: [facility] ORS;  Service: Gynecology;  Laterality: Left;    OB History    Gravida  0   Para  0   Term  0   Preterm  0   AB  0   Living  0     SAB  0   TAB  0   Ectopic  0   Multiple  0   Live Births  0            Home Medications    Prior to Admission medications   Medication Sig Start Date End Date Taking? Authorizing Provider  clonazePAM (KLONOPIN) 0.5 MG tablet Take 1 tablet (0.5 mg total) by mouth 2 (two) times daily as needed for anxiety. 12/19/18  Yes Particia Nearing, PA-C  levonorgestrel [facility]) 20 MCG/24HR IUD 1 each by Intrauterine route once.   Yes [provider]  levothyroxine (SYNTHROID, LEVOTHROID) 50 MCG tablet Take 1 tablet (50 mcg total) by mouth daily. 10/17/18  Yes Johnson, Megan P, DO  Multiple Vitamin (MULTIVITAMIN) tablet Take 1 tablet by mouth daily.   Yes [provider]  sertraline (ZOLOFT) 100 MG tablet Take 2 tablets (200 mg total) by mouth at bedtime. 10/14/18 01/12/19 Yes Johnson,  Megan P, DO  Vitamin D, Ergocalciferol, (DRISDOL) 1.25 MG (50000 UT) CAPS capsule Take 1 capsule (50,000 Units total) by mouth every 7 (seven) days. 10/17/18  Yes Dorcas Carrow, DO    Family History Family History  Problem Relation Age of Onset  . Diabetes Father   . Anxiety disorder Father   . Prostate cancer Paternal Grandfather   . Alzheimer's disease Maternal Grandmother   . Alzheimer's disease Maternal Grandfather   . Lung cancer Paternal Grandmother     Social History Social History   Tobacco Use  . Smoking status: Never Smoker  . Smokeless tobacco: Never Used  Substance Use Topics  . Alcohol use: Yes    Alcohol/week: 0.0 - 1.0  standard drinks    Comment: rare  . Drug use: No     Allergies   Penicillins; Azithromycin; Other; and Strawberry (diagnostic)   Review of Systems Review of Systems  Constitutional: Positive for chills and fatigue. Negative for fever.  Gastrointestinal: Positive for diarrhea.  Neurological: Positive for headaches.   Physical Exam Triage Vital Signs ED Triage Vitals  Enc Vitals Group     BP 12/30/18 0919 (!) 114/53     Pulse Rate 12/30/18 0919 63     Resp 12/30/18 0919 16     Temp 12/30/18 0919 98.6 F (37 C)     Temp Source 12/30/18 0919 Oral     SpO2 12/30/18 0919 100 %     Weight 12/30/18 0917 240 lb (108.9 kg)     Height 12/30/18 0917 5\' 3"  (1.6 m)     Head Circumference --      Peak Flow --      Pain Score 12/30/18 0917 4     Pain Loc --      Pain Edu? --      Excl. in GC? --    Updated Vital Signs BP (!) 114/53 (BP Location: Right Arm)   Pulse 63   Temp 98.6 F (37 C) (Oral)   Resp 16   Ht 5\' 3"  (1.6 m)   Wt 108.9 kg   LMP 12/04/2018 (Approximate)   SpO2 100%   BMI 42.51 kg/m   Visual Acuity Right Eye Distance:   Left Eye Distance:   Bilateral Distance:    Right Eye Near:   Left Eye Near:    Bilateral Near:     Physical Exam Vitals signs and nursing note reviewed.  Constitutional:      General: She is not in acute distress.    Appearance: Normal appearance. She is obese.  HENT:     Head: Normocephalic and atraumatic.  Eyes:     General:        Right eye: No discharge.        Left eye: No discharge.     Conjunctiva/sclera: Conjunctivae normal.  Neck:     Musculoskeletal: Neck supple.  Cardiovascular:     Rate and Rhythm: Normal rate and regular rhythm.  Pulmonary:     Effort: Pulmonary effort is normal.     Breath sounds: Normal breath sounds.  Lymphadenopathy:     Cervical: No cervical adenopathy.  Neurological:     Mental Status: She is alert.  Psychiatric:        Mood and Affect: Mood normal.        Behavior: Behavior normal.     UC Treatments / Results  Labs (all labs ordered are listed, but only abnormal results are displayed) Labs Reviewed - No data  to display  EKG None  Radiology No results found.  Procedures Procedures (including critical care time)  Medications Ordered in UC Medications - No data to display  Initial Impression / Assessment and Plan / UC Course  I have reviewed the triage vital signs and the nursing notes.  Pertinent labs & imaging results that were available during my care of the patient were reviewed by me and considered in my medical decision making (see chart for details).    38 year old female presents with multiple complaints.  Patient predominantly concerned about diarrhea and fatigue.  She is well-appearing on exam.  Evidence of flu or COVID-19.  Advised supportive care and to limit contact with her mother.  Wash hands well.  Push fluids.  Supportive care.  Final Clinical Impressions(s) / UC Diagnoses   Final diagnoses:  Diarrhea, unspecified type  Fatigue, unspecified type     Discharge Instructions     Rest, fluids.  Wash hands well.  No need to worry at this time.  Take care  Dr. Adriana Simas    ED Prescriptions    None     Controlled Substance Prescriptions [place] Controlled Substance Registry consulted? Not Applicable   Tommie Sams, DO 12/30/18 1003

## 2019-01-03 ENCOUNTER — Other Ambulatory Visit: Payer: Self-pay

## 2019-01-04 ENCOUNTER — Inpatient Hospital Stay: Payer: BLUE CROSS/BLUE SHIELD | Attending: Family Medicine

## 2019-01-20 ENCOUNTER — Telehealth: Payer: BLUE CROSS/BLUE SHIELD | Admitting: Nurse Practitioner

## 2019-01-20 DIAGNOSIS — B9789 Other viral agents as the cause of diseases classified elsewhere: Secondary | ICD-10-CM | POA: Diagnosis not present

## 2019-01-20 DIAGNOSIS — J329 Chronic sinusitis, unspecified: Secondary | ICD-10-CM

## 2019-01-20 MED ORDER — FLUTICASONE PROPIONATE 50 MCG/ACT NA SUSP: 2.0000 | Freq: Every day | NASAL | 6 refills | Status: DC

## 2019-01-20 NOTE — Progress Notes (Signed)
We are sorry that you are not feeling well.  Here is how we plan to help!  Based on what you have shared with me it looks like you have sinusitis.  Sinusitis is inflammation and infection in the sinus cavities of the head.  Based on your presentation I believe you most likely have Acute Viral Sinusitis.This is an infection most likely caused by a virus. There is not specific treatment for viral sinusitis other than to help you with the symptoms until the infection runs its course.  You may use an oral decongestant such as Mucinex D or if you have glaucoma or high blood pressure use plain Mucinex. Saline nasal spray help and can safely be used as often as needed for congestion, I have prescribed: Fluticasone nasal spray two sprays in each nostril once a day   As far as work is concerned and being around your mom, if you do not have a fever you should be fine. If you develop a fever then you will need to quarantine yourself. This can last 10 day sno matter what you do.  Some authorities believe that zinc sprays or the use of Echinacea may shorten the course of your symptoms.  Sinus infections are not as easily transmitted as other respiratory infection, however we still recommend that you avoid close contact with loved ones, especially the very young and elderly.  Remember to wash your hands thoroughly throughout the day as this is the number one way to prevent the spread of infection!  Home Care:  Only take medications as instructed by your medical team.  Do not take these medications with alcohol.  A steam or ultrasonic humidifier can help congestion.  You can place a towel over your head and breathe in the steam from hot water coming from a faucet.  Avoid close contacts especially the very young and the elderly.  Cover your mouth when you cough or sneeze.  Always remember to wash your hands.  Get Help Right Away If:  You develop worsening fever or sinus pain.  You develop a severe head  ache or visual changes.  Your symptoms persist after you have completed your treatment plan.  Make sure you  Understand these instructions.  Will watch your condition.  Will get help right away if you are not doing well or get worse.  Your e-visit answers were reviewed by a board certified advanced clinical practitioner to complete your personal care plan.  Depending on the condition, your plan could have included both over the counter or prescription medications.  If there is a problem please reply  once you have received a response from your provider.  Your safety is important to [place].  If you have drug allergies check your prescription carefully.    You can use MyChart to ask questions about today's visit, request a non-urgent call back, or ask for a work or school excuse for 24 hours related to this e-Visit. If it has been greater than 24 hours you will need to follow up with your provider, or enter a new e-Visit to address those concerns.  You will get an e-mail in the next two days asking about your experience.  I hope that your e-visit has been valuable and will speed your recovery. Thank you for using e-visits.   5-10 minutes spent reviewing and documenting in chart.

## 2019-01-22 ENCOUNTER — Encounter: Payer: Self-pay | Admitting: Family Medicine

## 2019-01-23 ENCOUNTER — Ambulatory Visit: Payer: Self-pay

## 2019-01-23 NOTE — Telephone Encounter (Signed)
Tried to call pt 2 times, line busy

## 2019-01-23 NOTE — Telephone Encounter (Signed)
Please schedule virtual visit

## 2019-01-23 NOTE — Telephone Encounter (Signed)
Copied from CRM [id number]. Topic: General - Inquiry >> Jan 23, 2019 10:35 AM Retta Diones wrote: Reason for CRM: Pt had an E-visit over the weekend. She was told if she was to develop a fever to call her PCP. She stated she has a lot-grade fever this morning. Would like to know if she needs to come into the office.

## 2019-01-23 NOTE — Telephone Encounter (Signed)
Called pt - line busy.

## 2019-01-23 NOTE — Telephone Encounter (Signed)
Incoming call from patient, with complaint of running a fever of 99.1 and 99.  Onset was Friday and Saturday.  Patient Reports having a sore throat.  Not sure what what is causing it. Denies being In contact with any family with an infection.  Has taken sudafed.   And Ibuprofen  Pm.  Denies  being immunocompromise.  Reports having a headache.   Temperature varies between  99.0 and  99.1  Does see white in  Back of throat.  LMP middle of March.  Related that office is closed and they will F/U in the morning.  Patient voiced understanding.    Reason for Disposition . Fever present > 3 days (72 hours)  Answer Assessment - Initial Assessment Questions 1. TEMPERATURE: "What is the most recent temperature?"  "How was it measured?"      99.3 2. ONSET: "When did the fever start?"      Saturday afternoon 3. SYMPTOMS: "Do you have any other symptoms besides the fever?"  (e.g., colds, headache, sore throat, earache, cough, rash, diarrhea, vomiting, abdominal pain)     Sore throat 4. CAUSE: If there are no symptoms, ask: "What do you think is causing the fever?"      Not really sure 5. CONTACTS: "Does anyone else in the family have an infection?"     denies 6. TREATMENT: "What have you done so far to treat this fever?" (e.g., medications)     Sudafed  Ibuprofen Pm 7. IMMUNOCOMPROMISE: "Do you have of the following: diabetes, HIV positive, splenectomy, cancer chemotherapy, chronic steroid treatment, transplant patient, etc."    denies 8. PREGNANCY: "Is there any chance you are pregnant?" "When was your last menstrual period?"     Middle of march 9. TRAVEL: "Have you traveled out of the country in the last month?" (e.g., travel history, exposures)     Denies.  Denies cough.  Answer Assessment - Initial Assessment Questions 1. ONSET: "When did the throat start hurting?" (Hours or days ago)      Friday 2. SEVERITY: "How bad is the sore throat?" (Scale 1-10; mild, moderate or severe)   - MILD (1-3):   doesn't interfere with eating or normal activities   - MODERATE (4-7): interferes with eating some solids and normal activities   - SEVERE (8-10):  excruciating pain, interferes with most normal activities   - SEVERE DYSPHAGIA: can't swallow liquids, drooling     moderate3. STREP EXPOSURE: "Has there been any exposure to strep within the past week?" If so, ask: "What type of contact occurred?"      denies 4.  VIRAL SYMPTOMS: "Are there any symptoms of a cold, such as a runny nose, cough, hoarse voice or red eyes?"      Every once a while 5. FEVER: "Do you have a fever?" If so, ask: "What is your temperature, how was it measured, and when did it start?"     99.0 ON THE TONSILS: "Is there pus on the tonsils in the back of your throat?"     Looks pretty red , does see some white back there 7. OTHER SYMPTOMS: "Do you have any other symptoms?" (e.g., difficulty breathing, headache, rash)     Horrible headache 8. PREGNANCY: "Is there any chance you are pregnant?" "When was your last menstrual period?"    Middle of march  Protocols used: COUGH - ACUTE NON-PRODUCTIVE-A-AH, FEVER-A-AH, SORE THROAT-A-AH

## 2019-01-24 ENCOUNTER — Encounter: Payer: Self-pay | Admitting: Family Medicine

## 2019-01-24 ENCOUNTER — Other Ambulatory Visit: Payer: BLUE CROSS/BLUE SHIELD

## 2019-01-24 ENCOUNTER — Other Ambulatory Visit: Payer: Self-pay

## 2019-01-24 ENCOUNTER — Ambulatory Visit (INDEPENDENT_AMBULATORY_CARE_PROVIDER_SITE_OTHER): Payer: BLUE CROSS/BLUE SHIELD | Admitting: Family Medicine

## 2019-01-24 VITALS — HR 64 | Temp 99.2°F

## 2019-01-24 DIAGNOSIS — J0100 Acute maxillary sinusitis, unspecified: Secondary | ICD-10-CM

## 2019-01-24 MED ORDER — DOXYCYCLINE HYCLATE 100 MG PO TABS: 100.0000 mg | ORAL_TABLET | Freq: Two times a day (BID) | ORAL | 0 refills | Status: DC

## 2019-01-24 NOTE — Telephone Encounter (Signed)
Patient wanted to make sure you saw this to make sure you did not want her to come in the office..please advise.  Thank you

## 2019-01-24 NOTE — Telephone Encounter (Signed)
She has a fever and upper respiratory symptoms. We are not permitted to see her in the office. We can do a virtual visit and if I feel she needs to be seen we will direct her to urgent care.

## 2019-01-24 NOTE — Progress Notes (Signed)
Pulse 64   Temp 99.2 F (37.3 C)   SpO2 98%    Subjective:    Patient ID: Sharon Gibson, female    DOB: May 19, 1981, 38 y.o.   MRN: [id number]  HPI: Sharon Gibson is a 38 y.o. female  Chief Complaint  Patient presents with  . Sore Throat    fever, sinus pressure in head   UPPER RESPIRATORY TRACT INFECTION Duration: 4-5 days ago Worst symptom: sore throat, laryngitis Fever: yes- subjective, 99.9, chills and sweats Cough: yes- occasional with dry throat Shortness of breath: no Wheezing: no Chest pain: no Chest tightness: no Chest congestion: no Nasal congestion: yes Runny nose: no Post nasal drip: no Sneezing: no Sore throat: yes Swollen glands: no Sinus pressure: yes Headache: yes Face pain: yes Toothache: no Ear pain: yes bilateral Ear pressure: yes bilateral Eyes red/itching:no Eye drainage/crusting: no  Vomiting: no Rash: no Fatigue: yes Sick contacts: no Strep contacts: no  Context: worse Recurrent sinusitis: no Relief with OTC cold/cough medications: no  Treatments attempted: ibuprofen, flonase and pseudoephedrine   Relevant past medical, surgical, family and social history reviewed and updated as indicated. Interim medical history since our last visit reviewed. Allergies and medications reviewed and updated.  Review of Systems  Constitutional: Negative.   HENT: Positive for congestion, ear pain, nosebleeds, postnasal drip, rhinorrhea, sinus pressure, sinus pain and sore throat. Negative for dental problem, drooling, ear discharge, facial swelling, hearing loss, mouth sores, sneezing, tinnitus, trouble swallowing and voice change.   Eyes: Negative.   Respiratory: Negative.   Cardiovascular: Negative.   Gastrointestinal: Negative.   Neurological: Negative.   Psychiatric/Behavioral: Negative.     Per HPI unless specifically indicated above     Objective:    Pulse 64   Temp 99.2 F (37.3 C)   SpO2 98%   Wt Readings from Last 3  Encounters:  12/30/18 240 lb (108.9 kg)  12/11/18 250 lb (113.4 kg)  10/26/18 253 lb 3.2 oz (114.8 kg)    Physical Exam Vitals signs and nursing note reviewed.  Constitutional:      General: She is not in acute distress.    Appearance: Normal appearance. She is not ill-appearing, toxic-appearing or diaphoretic.  HENT:     Head: Normocephalic and atraumatic.     Right Ear: External ear normal.     Left Ear: External ear normal.     Nose: Nose normal. No congestion or rhinorrhea.     Mouth/Throat:     Mouth: Mucous membranes are moist. No oral lesions.     Pharynx: Oropharynx is clear. No pharyngeal swelling, oropharyngeal exudate, posterior oropharyngeal erythema or uvula swelling.     Tonsils: No tonsillar exudate. 0 on the right. 0 on the left.  Eyes:     General: No scleral icterus.       Right eye: No discharge.        Left eye: No discharge.     Extraocular Movements:     Right eye: Normal extraocular motion.     Left eye: Normal extraocular motion.     Conjunctiva/sclera: Conjunctivae normal.     Pupils: Pupils are equal, round, and reactive to light.  Neck:     Musculoskeletal: Normal range of motion.  Pulmonary:     Effort: Pulmonary effort is normal. No respiratory distress.     Comments: Speaking in full sentences Musculoskeletal: Normal range of motion.  Skin:    Coloration: Skin is not jaundiced or pale.     Findings:  No bruising, erythema, lesion or rash.  Neurological:     Mental Status: She is alert and oriented to person, place, and time. Mental status is at baseline.  Psychiatric:        Mood and Affect: Mood normal.        Behavior: Behavior normal.        Thought Content: Thought content normal.        Judgment: Judgment normal.     Results for orders placed or performed during the hospital encounter of 12/11/18  Basic metabolic panel  Result Value Ref Range   Sodium 141 135 - 145 mmol/L   Potassium 3.8 3.5 - 5.1 mmol/L   Chloride 110 98 - 111  mmol/L   CO2 24 22 - 32 mmol/L   Glucose, Bld 102 (H) 70 - 99 mg/dL   BUN 20 6 - 20 mg/dL   Creatinine, Ser 4.80 0.44 - 1.00 mg/dL   Calcium 8.7 (L) 8.9 - 10.3 mg/dL   GFR calc non Af Amer >60 >60 mL/min   GFR calc Af Amer >60 >60 mL/min   Anion gap 7 5 - 15  CBC  Result Value Ref Range   WBC 9.4 4.0 - 10.5 K/uL   RBC 4.73 3.87 - 5.11 MIL/uL   Hemoglobin 13.3 12.0 - 15.0 g/dL   HCT 16.5 53.7 - 48.2 %   MCV 88.8 80.0 - 100.0 fL   MCH 28.1 26.0 - 34.0 pg   MCHC 31.7 30.0 - 36.0 g/dL   RDW 70.7 86.7 - 54.4 %   Platelets 299 150 - 400 K/uL   nRBC 0.0 0.0 - 0.2 %  Troponin I - ONCE - STAT  Result Value Ref Range   Troponin I <0.03 <0.03 ng/mL      Assessment & Plan:   Problem List Items Addressed This Visit    None    Visit Diagnoses    Acute non-recurrent maxillary sinusitis    -  Primary   Will treat with doxycycline and recheck on Friday. Will remain in self-quarantine until no fever for >72 hours. Call with any concerns.    Relevant Medications   pseudoephedrine (SUDAFED) 30 MG tablet   doxycycline (VIBRA-TABS) 100 MG tablet       Follow up plan: Return Friday- follow up symptoms.   . This visit was completed via FaceTime due to the restrictions of the COVID-19 pandemic. All issues as above were discussed and addressed. Physical exam was done as above through visual confirmation on FaceTime. If it was felt that the patient should be evaluated in the office, they were directed there. The patient verbally consented to this visit. . Location of the patient: home . Location of the provider: work . Those involved with this call:  . Provider: Olevia Perches, DO . CMA: Tiffany Reel, CMA . Front Desk/Registration: Adela Ports  . Time spent on call: 15 minutes with patient face to face via video conference. More than 50% of this time was spent in counseling and coordination of care. 23 minutes total spent in review of patient's record and preparation of their chart.

## 2019-01-24 NOTE — Telephone Encounter (Signed)
Appt made. Routing to close.

## 2019-01-25 ENCOUNTER — Ambulatory Visit: Payer: BLUE CROSS/BLUE SHIELD

## 2019-01-25 ENCOUNTER — Ambulatory Visit: Payer: BLUE CROSS/BLUE SHIELD | Admitting: Hematology and Oncology

## 2019-01-27 ENCOUNTER — Encounter: Payer: Self-pay | Admitting: Family Medicine

## 2019-01-27 ENCOUNTER — Ambulatory Visit (INDEPENDENT_AMBULATORY_CARE_PROVIDER_SITE_OTHER): Payer: BLUE CROSS/BLUE SHIELD | Admitting: Family Medicine

## 2019-01-27 ENCOUNTER — Other Ambulatory Visit: Payer: Self-pay

## 2019-01-27 VITALS — HR 63 | Temp 98.4°F | Ht 63.0 in

## 2019-01-27 DIAGNOSIS — J0100 Acute maxillary sinusitis, unspecified: Secondary | ICD-10-CM | POA: Diagnosis not present

## 2019-01-27 NOTE — Progress Notes (Signed)
Pulse 63   Temp 98.4 F (36.9 C) (Oral)   Ht 5\' 3"  (1.6 m)   SpO2 99%   BMI 42.51 kg/m    Subjective:    Patient ID: Sharon Gibson, female    DOB: 1981/05/17, 38 y.o.   MRN: [id number]  HPI: Sharon Gibson is a 38 y.o. female  Chief Complaint  Patient presents with  . Headache    F/U from Tuesday. Better but still has symptoms.  . Nasal Congestion  . Sore Throat   Feeling a bit better, but not quite there. Still has a bit of a sore throat and runny nose. Pressure is gone. Temperature has been below 99. She is feeling better and better, but still not quite there. No cough. No other concerns or complaints at this time.   Relevant past medical, surgical, family and social history reviewed and updated as indicated. Interim medical history since our last visit reviewed. Allergies and medications reviewed and updated.  Review of Systems  Constitutional: Negative.   HENT: Positive for postnasal drip, rhinorrhea and sore throat. Negative for congestion, dental problem, drooling, ear discharge, ear pain, facial swelling, hearing loss, mouth sores, nosebleeds, sinus pressure, sinus pain, sneezing, tinnitus, trouble swallowing and voice change.   Respiratory: Negative.   Cardiovascular: Negative.   Neurological: Negative.   Psychiatric/Behavioral: Negative.     Per HPI unless specifically indicated above     Objective:    Pulse 63   Temp 98.4 F (36.9 C) (Oral)   Ht 5\' 3"  (1.6 m)   SpO2 99%   BMI 42.51 kg/m   Wt Readings from Last 3 Encounters:  12/30/18 240 lb (108.9 kg)  12/11/18 250 lb (113.4 kg)  10/26/18 253 lb 3.2 oz (114.8 kg)    Physical Exam Vitals signs and nursing note reviewed.  Constitutional:      General: She is not in acute distress.    Appearance: Normal appearance. She is not ill-appearing, toxic-appearing or diaphoretic.  HENT:     Head: Normocephalic and atraumatic.     Right Ear: External ear normal.     Left Ear: External ear normal.      Nose: Nose normal.     Mouth/Throat:     Mouth: Mucous membranes are moist.     Pharynx: Oropharynx is clear.  Eyes:     General: No scleral icterus.       Right eye: No discharge.        Left eye: No discharge.     Conjunctiva/sclera: Conjunctivae normal.     Pupils: Pupils are equal, round, and reactive to light.  Neck:     Musculoskeletal: Normal range of motion.  Pulmonary:     Effort: Pulmonary effort is normal. No respiratory distress.     Comments: Speaking in full sentences Musculoskeletal: Normal range of motion.  Skin:    Coloration: Skin is not jaundiced or pale.     Findings: No bruising, erythema, lesion or rash.  Neurological:     Mental Status: She is alert and oriented to person, place, and time. Mental status is at baseline.  Psychiatric:        Mood and Affect: Mood normal.        Behavior: Behavior normal.        Thought Content: Thought content normal.        Judgment: Judgment normal.     Results for orders placed or performed during the hospital encounter of 12/11/18  Basic metabolic panel  Result Value Ref Range   Sodium 141 135 - 145 mmol/L   Potassium 3.8 3.5 - 5.1 mmol/L   Chloride 110 98 - 111 mmol/L   CO2 24 22 - 32 mmol/L   Glucose, Bld 102 (H) 70 - 99 mg/dL   BUN 20 6 - 20 mg/dL   Creatinine, Ser 3.15 0.44 - 1.00 mg/dL   Calcium 8.7 (L) 8.9 - 10.3 mg/dL   GFR calc non Af Amer >60 >60 mL/min   GFR calc Af Amer >60 >60 mL/min   Anion gap 7 5 - 15  CBC  Result Value Ref Range   WBC 9.4 4.0 - 10.5 K/uL   RBC 4.73 3.87 - 5.11 MIL/uL   Hemoglobin 13.3 12.0 - 15.0 g/dL   HCT 94.5 85.9 - 29.2 %   MCV 88.8 80.0 - 100.0 fL   MCH 28.1 26.0 - 34.0 pg   MCHC 31.7 30.0 - 36.0 g/dL   RDW 44.6 28.6 - 38.1 %   Platelets 299 150 - 400 K/uL   nRBC 0.0 0.0 - 0.2 %  Troponin I - ONCE - STAT  Result Value Ref Range   Troponin I <0.03 <0.03 ng/mL      Assessment & Plan:   Problem List Items Addressed This Visit    None    Visit Diagnoses     Acute non-recurrent maxillary sinusitis    -  Primary   Doing much better. Will stay out of work for another few days to confirm resolution. Call with any concerns.        Follow up plan: Return if symptoms worsen or fail to improve.    . This visit was completed via FaceTime due to the restrictions of the COVID-19 pandemic. All issues as above were discussed and addressed. Physical exam was done as above through visual confirmation on FaceTime. If it was felt that the patient should be evaluated in the office, they were directed there. The patient verbally consented to this visit. . Location of the patient: home . Location of the provider: work . Those involved with this call:  . Provider: Olevia Perches, DO . CMA: Myrtha Mantis, CMA . Front Desk/Registration: Adela Ports  . Time spent on call: 10 minutes with patient face to face via video conference. More than 50% of this time was spent in counseling and coordination of care. 15 minutes total spent in review of patient's record and preparation of their chart.

## 2019-02-04 ENCOUNTER — Encounter: Payer: Self-pay | Admitting: Hematology and Oncology

## 2019-02-21 ENCOUNTER — Other Ambulatory Visit: Payer: Self-pay

## 2019-02-21 ENCOUNTER — Telehealth: Payer: Self-pay

## 2019-02-21 ENCOUNTER — Inpatient Hospital Stay: Payer: BLUE CROSS/BLUE SHIELD | Attending: Hematology and Oncology

## 2019-02-21 DIAGNOSIS — D509 Iron deficiency anemia, unspecified: Secondary | ICD-10-CM | POA: Insufficient documentation

## 2019-02-21 DIAGNOSIS — K9589 Other complications of other bariatric procedure: Secondary | ICD-10-CM

## 2019-02-21 DIAGNOSIS — E538 Deficiency of other specified B group vitamins: Secondary | ICD-10-CM

## 2019-02-21 LAB — CBC WITH DIFFERENTIAL/PLATELET
Abs Immature Granulocytes: 0.03 10*3/uL (ref 0.00–0.07)
Basophils Absolute: 0.1 10*3/uL (ref 0.0–0.1)
Basophils Relative: 1 %
Eosinophils Absolute: 0.3 10*3/uL (ref 0.0–0.5)
Eosinophils Relative: 4 %
HCT: 41.9 % (ref 36.0–46.0)
Hemoglobin: 13.6 g/dL (ref 12.0–15.0)
Immature Granulocytes: 0 %
Lymphocytes Relative: 28 %
Lymphs Abs: 2.1 10*3/uL (ref 0.7–4.0)
MCH: 29.2 pg (ref 26.0–34.0)
MCHC: 32.5 g/dL (ref 30.0–36.0)
MCV: 90.1 fL (ref 80.0–100.0)
Monocytes Absolute: 0.5 10*3/uL (ref 0.1–1.0)
Monocytes Relative: 6 %
Neutro Abs: 4.6 10*3/uL (ref 1.7–7.7)
Neutrophils Relative %: 61 %
Platelets: 243 10*3/uL (ref 150–400)
RBC: 4.65 MIL/uL (ref 3.87–5.11)
RDW: 13.3 % (ref 11.5–15.5)
WBC: 7.5 10*3/uL (ref 4.0–10.5)
nRBC: 0 % (ref 0.0–0.2)

## 2019-02-21 LAB — FERRITIN: Ferritin: 26 ng/mL (ref 11–307)

## 2019-02-21 NOTE — Telephone Encounter (Signed)
-----   Message from Rosey Bath, MD sent at 02/21/2019 12:33 PM EDT ----- Regarding: Please call patient  Hemoglobin and hematocrit are good.  Ferritin < 30.  This is her typical cut off for IV iron.  Ask if she is having any symptoms.  M ----- Message ----- From: Leory Plowman, [facility] [place] Sent: 02/21/2019   8:31 AM EDT To: Rosey Bath, MD

## 2019-02-21 NOTE — Telephone Encounter (Signed)
Left a message to inform the patient that her H&H was good today and to see if she is having any symptoms due to her iron levels. I have informed the patient to contact the office at her earliest time.

## 2019-02-22 ENCOUNTER — Ambulatory Visit: Payer: BLUE CROSS/BLUE SHIELD | Admitting: Hematology and Oncology

## 2019-02-22 ENCOUNTER — Ambulatory Visit: Payer: BLUE CROSS/BLUE SHIELD

## 2019-02-22 NOTE — Progress Notes (Deleted)
[facility] [place] [facility]     [address], [address]     [place], [place] [address]     Phone: [phone]      Fax: [phone]        Clinic day:  02/22/19  Chief Complaint: Sharon CatenaKristen Gibson is a 38 y.o. female s/p gastric bypass surgery with subsequent B12 and iron deficiency who is seen for 4 month assessment.  HPI:  The patient was last seen in the hematology clinic on 10/26/2018.  At that time, she described fatigue for 2 months.  She denied any melena, hematochezia or hematuria.  Exam was stable.  Hematocrit was 38.9, hemoglobin 12.8, and MCV 85.  Ferritin was 13 with an iron saturation of 7%.  Folate was 16.9. B12 was 323.  She received weekly Venofer x 3 (10/27/2018 - 11/10/2018).  She received B12 on 12/07/2018.  During the interim,   Past Medical History:  Diagnosis Date  . Anxiety   . Chicken pox   . Depression   . Eating disorder    Has had residential treatment and hospitalization previously.  Marland Kitchen. Heart murmur    as a child  . History of anemia    RECIEVES IRON INFUSIONS YEARLY  . History of pulmonary embolus (PE) 2002   bil[facility]-w/u negative per pt  . Hypothyroidism    no meds currently  . PTSD (post-traumatic stress disorder)     Past Surgical History:  Procedure Laterality Date  . CHOLECYSTECTOMY  2003  . EXCISION OF ENDOMETRIOMA    . GASTRIC BYPASS  2003  . LAPAROSCOPIC OVARIAN CYSTECTOMY Left 05/10/2018   Procedure: LAPAROSCOPIC OVARIAN CYSTECTOMY;  Surgeon: Natale MilchSchuman, Christanna R, MD;  Location: [facility] ORS;  Service: Gynecology;  Laterality: Left;    Family History  Problem Relation Age of Onset  . Diabetes Father   . Anxiety disorder Father   . Prostate cancer Paternal Grandfather   . Alzheimer's disease Maternal Grandmother   . Alzheimer's disease Maternal Grandfather   . Lung cancer Paternal Grandmother     Social History:  reports that she has never smoked. She has never used smokeless tobacco.  She reports current alcohol use. She reports that she does not use drugs.  [place] previously lived in [place], [place]ulting [place] then [place] ([place] school[place]  She lives in [place], [place].  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  The patient is alone today.  Allergies:  Allergies  Allergen Reactions  . Penicillins Hives    Has patient had a PCN reaction causing immediate rash, facial/tongue/throat swelling, SOB or lightheadedness with hypotension: yes Has patient had a PCN reaction causing severe rash involving mucus membranes or skin necrosis: no Has patient had a PCN reaction that required hospitalization: yes Has patient had a PCN reaction occurring within the last 10 years: no If all of the above answers are "NO", then may proceed with Cephalosporin use.   . Azithromycin   . Other     Narcotics- Burning in the stomach (whether PO or IV)  . Strawberry (Diagnostic) Swelling    AND WALNUTS-TONGUE SWELLING    Current Medications: Current Outpatient Medications  Medication Sig Dispense Refill  . clonazePAM (KLONOPIN) 0.5 MG tablet Take 1 tablet (0.5 mg total) by mouth 2 (two) times daily as needed for anxiety. 60 tablet 1  . doxycycline (VIBRA-TABS) 100 MG tablet Take 1 tablet (100 mg total) by mouth 2 (two) times daily. 20 tablet 0  .  fluticasone (FLONASE) 50 MCG/ACT nasal spray Place 2 sprays into both nostrils daily. 16 g 6  . levonorgestrel (MIRENA) 20 MCG/24HR IUD 1 each by Intrauterine route once.    Marland Kitchen levothyroxine (SYNTHROID, LEVOTHROID) 50 MCG tablet Take 1 tablet (50 mcg total) by mouth daily. 30 tablet 3  . Multiple Vitamin (MULTIVITAMIN) tablet Take 1 tablet by mouth daily.    . pseudoephedrine (SUDAFED) 30 MG tablet Take 30 mg by mouth every 4 (four) hours as needed for congestion.    . sertraline (ZOLOFT) 100 MG tablet Take 2 tablets (200 mg total) by mouth at bedtime. 180 tablet 1  . Vitamin D, Ergocalciferol, (DRISDOL) 1.25 MG (50000 UT) CAPS  capsule Take 1 capsule (50,000 Units total) by mouth every 7 (seven) days. 12 capsule 3   No current facility-administered medications for this visit.     Review of Systems:  GENERAL:  Feels "not too bad".  No fevers, sweats or weight loss. PERFORMANCE STATUS (ECOG):  1 HEENT:  No visual changes, runny nose, sore throat, mouth sores or tenderness. Lungs:  Shortness of breath on exertion.  No cough.  No hemoptysis. Cardiac:  No chest pain, palpitations, orthopnea, or PND. GI:  No nausea, vomiting, diarrhea, constipation, melena or hematochezia.  Ice pica. GU:  No urgency, frequency, dysuria, or hematuria.  Menstrual cycles "wierd". Musculoskeletal:  No back pain.  No joint pain.  No muscle tenderness. Extremities:  No pain or swelling. Skin:  No rashes or skin changes. Neuro:  Restless legs.  No headache, numbness or weakness, balance or coordination issues. Endocrine:  No diabetes, thyroid issues, hot flashes or night sweats. Psych:  No mood changes, depression or anxiety. Pain:  No focal pain. Review of systems:  All other systems reviewed and found to be negative.   Physical Exam: There were no vitals taken for this visit. GENERAL:  Well developed, well nourished, heavyset woman sitting comfortably in the exam room in no acute distress. MENTAL STATUS:  Alert and oriented to person, place and time. HEAD:  Long red hair.  Normocephalic, atraumatic, face symmetric, no Cushingoid features. EYES:  Glasses.  Blue eyes.  Pupils equal round and reactive to light and accomodation.  No conjunctivitis or scleral icterus. ENT:  Oropharynx clear without lesion.  Tongue normal. Mucous membranes moist.  RESPIRATORY:  Clear to auscultation without rales, wheezes or rhonchi. CARDIOVASCULAR:  Regular rate and rhythm without murmur, rub or gallop. ABDOMEN:  Fully round.  Soft, non-tender, with active bowel sounds, and no appreciable hepatosplenomegaly.  No masses. SKIN:  No rashes, ulcers or lesions.  EXTREMITIES:  Chronic lower extremity changes.  No skin discoloration or tenderness.  No palpable cords. LYMPH NODES: No palpable cervical, supraclavicular, axillary or inguinal adenopathy  NEUROLOGICAL: Unremarkable. PSYCH:  Appropriate.    Appointment on 02/21/2019  Component Date Value Ref Range Status  . WBC 02/21/2019 7.5  4.0 - 10.5 K/uL Final  . RBC 02/21/2019 4.65  3.87 - 5.11 MIL/uL Final  . Hemoglobin 02/21/2019 13.6  12.0 - 15.0 g/dL Final  . HCT 03/23/9484 41.9  36.0 - 46.0 % Final  . MCV 02/21/2019 90.1  80.0 - 100.0 fL Final  . MCH 02/21/2019 29.2  26.0 - 34.0 pg Final  . MCHC 02/21/2019 32.5  30.0 - 36.0 g/dL Final  . RDW 46/27/0350 13.3  11.5 - 15.5 % Final  . Platelets 02/21/2019 243  150 - 400 K/uL Final  . nRBC 02/21/2019 0.0  0.0 - 0.2 % Final  .  Neutrophils Relative % 02/21/2019 61  % Final  . Neutro Abs 02/21/2019 4.6  1.7 - 7.7 K/uL Final  . Lymphocytes Relative 02/21/2019 28  % Final  . Lymphs Abs 02/21/2019 2.1  0.7 - 4.0 K/uL Final  . Monocytes Relative 02/21/2019 6  % Final  . Monocytes Absolute 02/21/2019 0.5  0.1 - 1.0 K/uL Final  . Eosinophils Relative 02/21/2019 4  % Final  . Eosinophils Absolute 02/21/2019 0.3  0.0 - 0.5 K/uL Final  . Basophils Relative 02/21/2019 1  % Final  . Basophils Absolute 02/21/2019 0.1  0.0 - 0.1 K/uL Final  . Immature Granulocytes 02/21/2019 0  % Final  . Abs Immature Granulocytes 02/21/2019 0.03  0.00 - 0.07 K/uL Final   Performed at [facility], [address], [place], [place] [address]  . Ferritin 02/21/2019 26  11 - 307 ng/mL Final   Performed at [facility], [address] [place], [place] [address]    Assessment:  Sharon Gibson is a 38 y.o. female with B12 and iron deficiency anemia s/p gastric bypass surgery (2004).  She has received IV iron (different preparations) x 5 ([place], [place]; [place], [place];  [place], [place]) with reactions requiring Benadryl and steroids.  Labs on 03/20/2016  revealed a normal hematocrit but iron deficiency (ferritin 8).  B12 was 185 (low).  She began B12 on 04/07/2016.  She received Venofer 200 mg IV with premedications (Tylenol, Benadryl, and Decadron) on 05/13/2016.  She had side effects of dizziness, lightheadedness, low blood pressure, excess sweating, aches, and pains, nausea, and headache after her infusion.  She received weekly Venofer x 3 (10/27/2018 - 11/10/2018).  Ferritin has been followed: 7.9 on 03/20/2016, 8 on 04/08/2016, 22 on 07/08/2016, 18 on 10/07/2016, 10 on 12/16/2016, 13 on 10/14/2018, 53 on 12/07/2018, and 26 on 02/21/2019.  She has B12 deficiency.  B12 was 185 on 03/20/2016 and 323 on 10/14/2018.  She received B12 IM on 12/07/2018.  She has a history of multiple pulmonary emboli in 2003.  Per patient report, hypercoagulable work-up was negative.  She was on Coumadin x 6 months.  She has a history of easy bruising and heavy menses.  She has an IUD.  Symptomatically,   Plan: 1.   Review labs from 02/21/2019.   2.   Iron deficiency  Review interval history.  Hematocrit 38.9.  Hemoglobin 12.8.  MCV 85.  Ferritin was 13.  Iron saturation 7%  Patient symptomatic with fatigue, ice pica, and restless legs.  Discuss plans to replete iron stores.  Ferritin goal 100.  Discuss plan for future IV iron if ferritin < 30 to prevent recurrent symptoms.  Venofer premeds:  Tylenol 650 mg, Benadryl 50 mg, and Decadron 10 mg secondary to history of reaction. 3.   B12 deficiency  B12 was 323 on 10/14/2018.  B12 today and monthly. 4.   RTC in 6 weeks for labs (CCB with diff, ferritin). 5.   RTC in 3 months for MD assessment, labs (CBC with diff, ferritin - day before), and +/- Venofer.   I discussed the assessment and treatment plan with the patient.  The patient was provided an opportunity to ask questions and all were answered.  The patient agreed with the plan and demonstrated an understanding of the instructions.  The patient was  advised to call back or seek an in person evaluation if the symptoms worsen or if the condition fails to improve as anticipated.    Rosey Bath, MD  02/22/2019, 5:29 AM

## 2019-03-09 IMAGING — CR DG FOOT COMPLETE 3+V*L*
1 series · 3 of 3 positions shown · non-contrast
Comparison: None.

CLINICAL DATA: O put left heel pain.  Constant pain.

EXAM:
LEFT FOOT - COMPLETE 3+ VIEW

[Series 1: x foot ap left · 0.14mm/px · 3 of 3 slices shown]
[im 1/3]
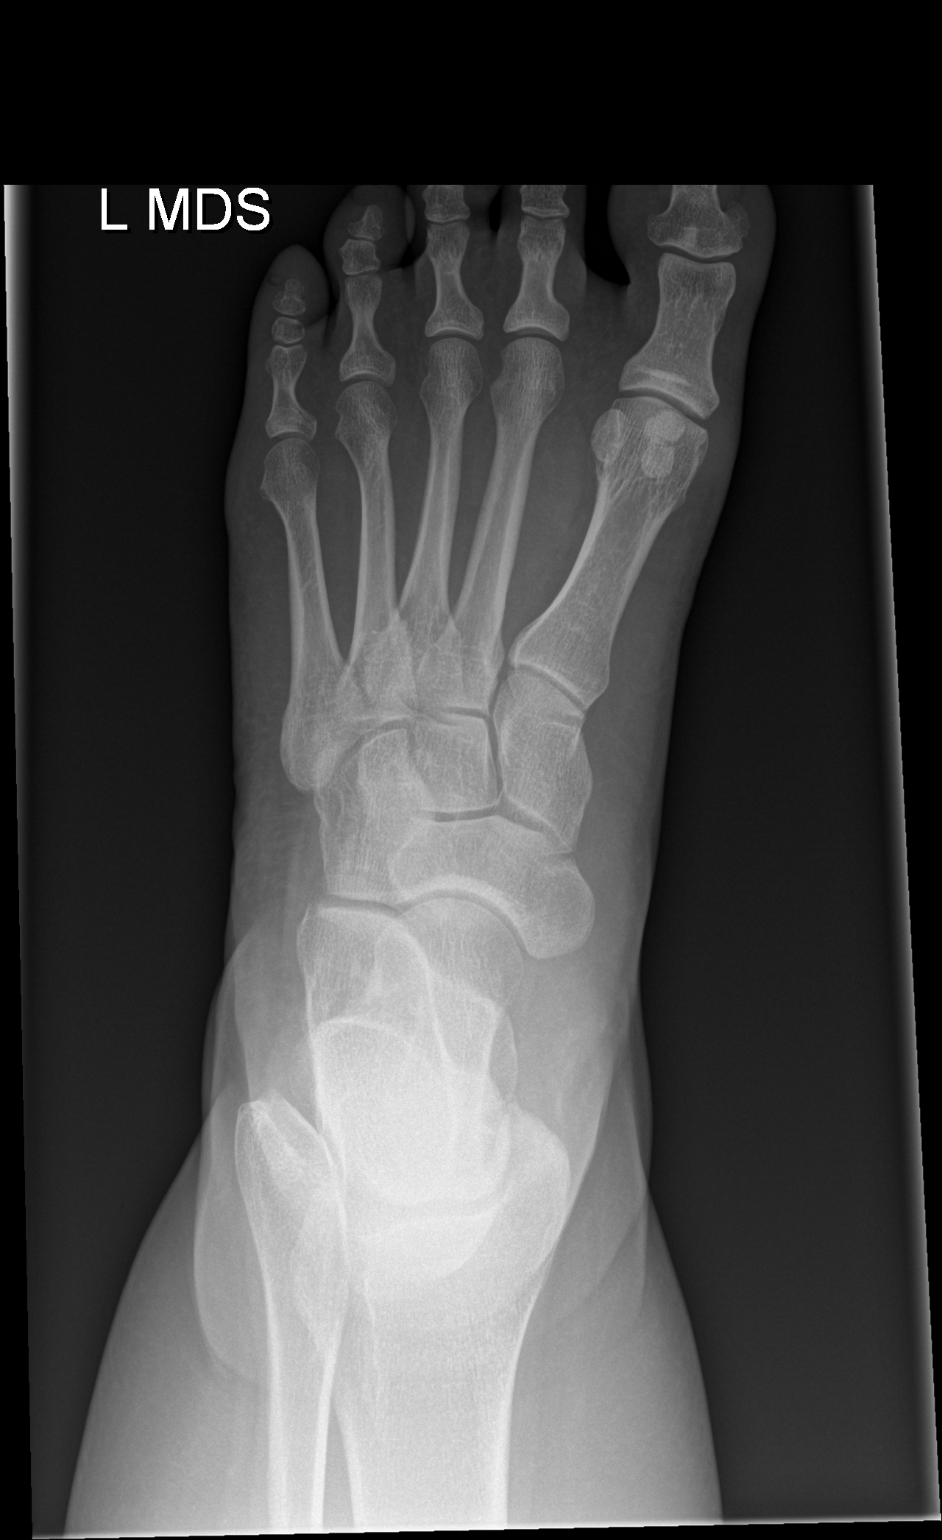
[im 2/3]
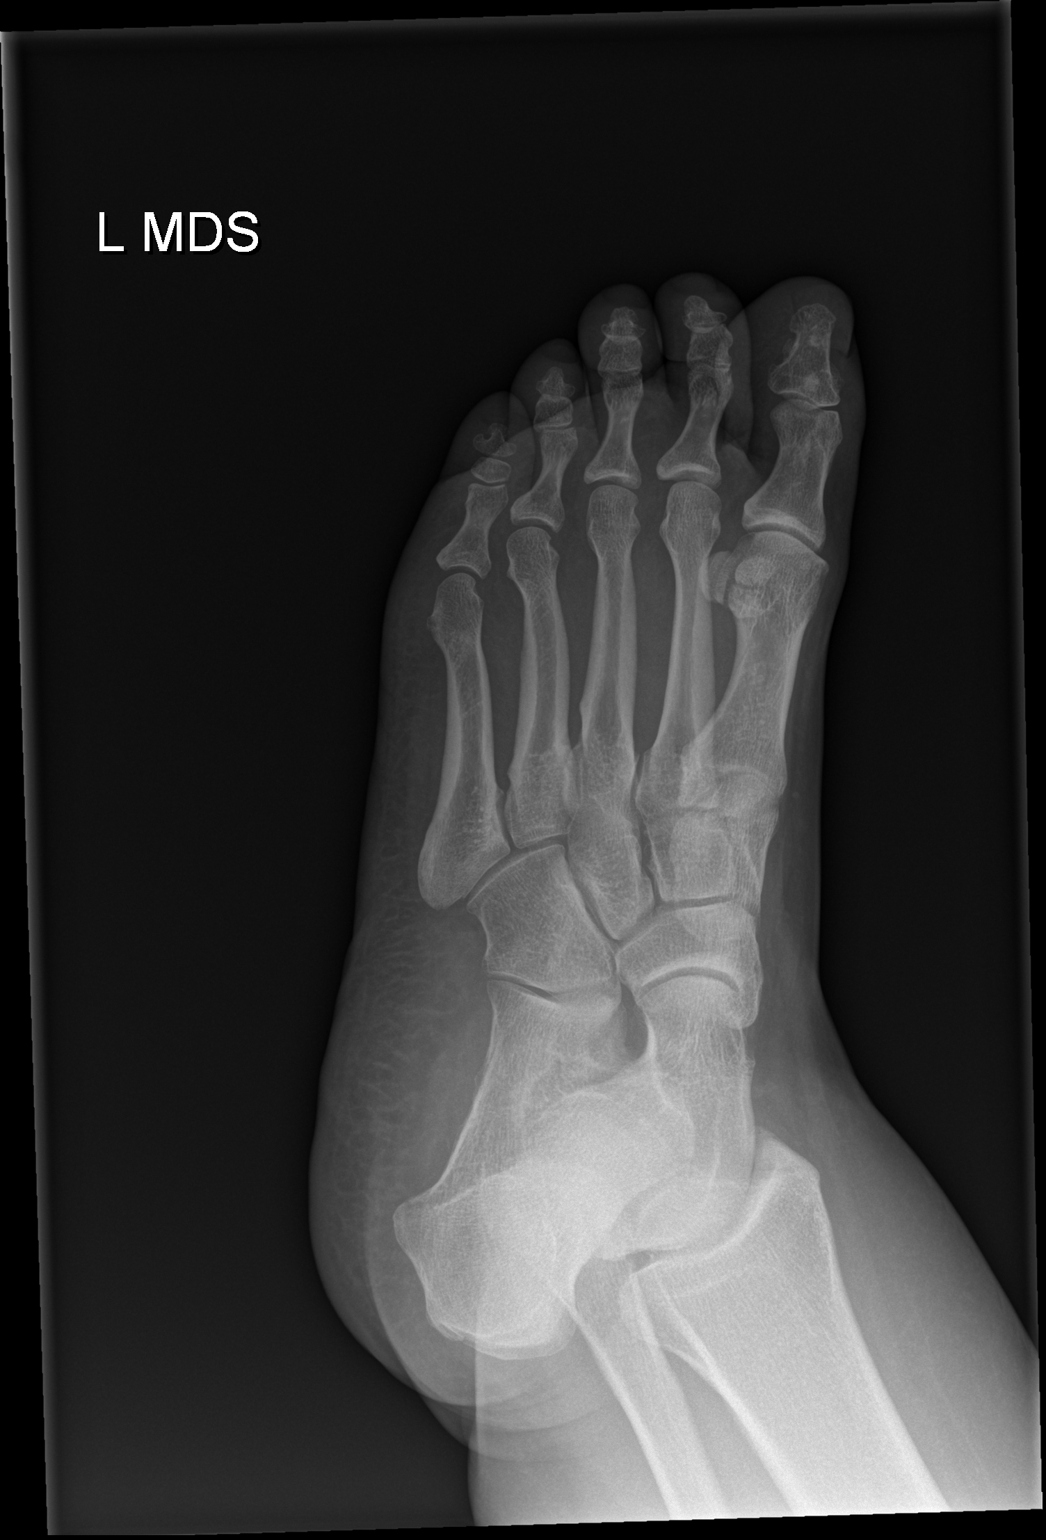
[im 3/3]
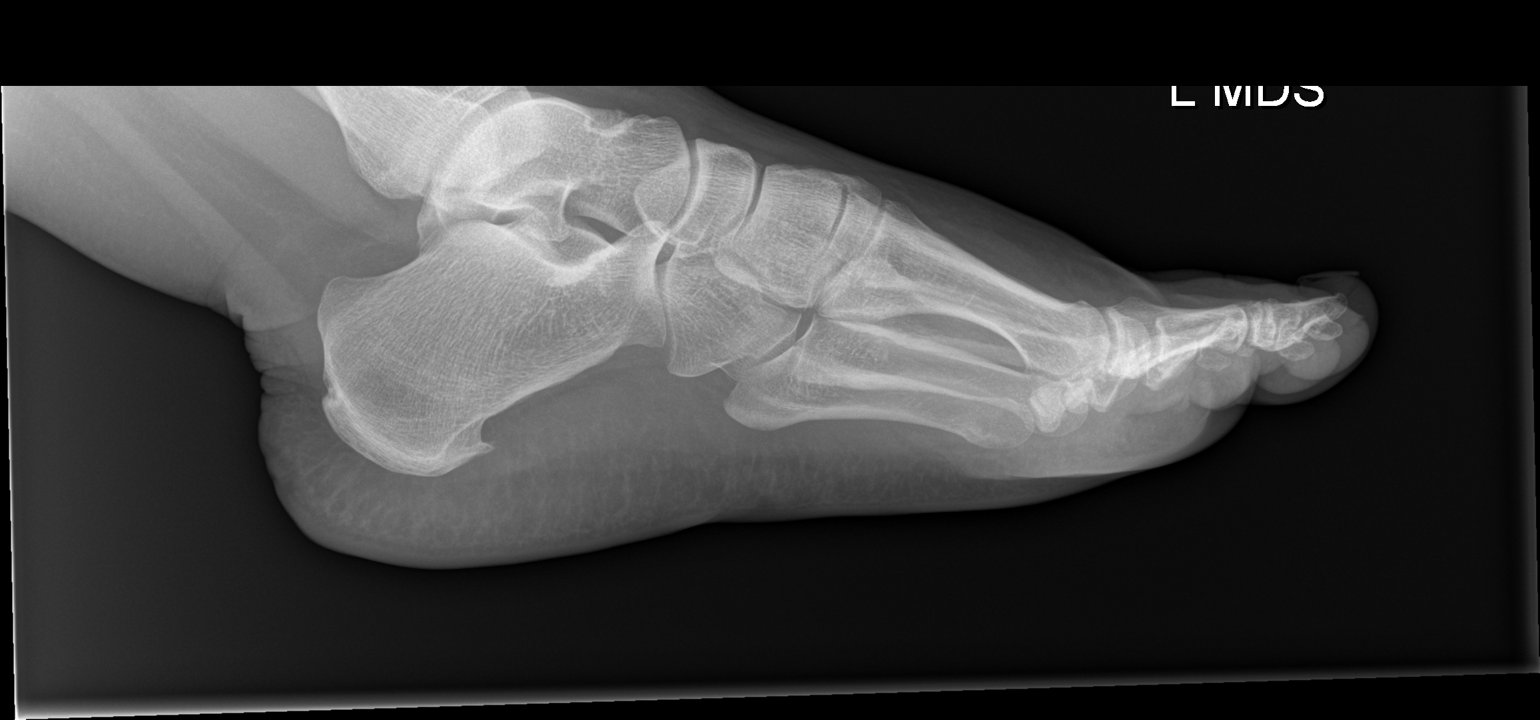

[3 of 3 positions shown; findings below may reference images not displayed]

FINDINGS: There is no evidence of fracture or dislocation. There is a plantar
calcaneal spur. The joint spaces are relatively well maintained.
Soft tissues are unremarkable.
IMPRESSION: 1.  No acute osseous injury of the left foot.
2. Plantar calcaneal spur.

## 2019-03-13 ENCOUNTER — Ambulatory Visit: Payer: BLUE CROSS/BLUE SHIELD | Admitting: Hematology and Oncology

## 2019-03-13 ENCOUNTER — Ambulatory Visit: Payer: BLUE CROSS/BLUE SHIELD

## 2019-03-14 ENCOUNTER — Encounter: Payer: Self-pay | Admitting: Hematology and Oncology

## 2019-03-16 ENCOUNTER — Inpatient Hospital Stay: Payer: BLUE CROSS/BLUE SHIELD | Attending: Family Medicine

## 2019-03-16 ENCOUNTER — Other Ambulatory Visit: Payer: Self-pay

## 2019-03-16 ENCOUNTER — Other Ambulatory Visit: Payer: BLUE CROSS/BLUE SHIELD

## 2019-03-16 ENCOUNTER — Encounter: Payer: Self-pay | Admitting: Family Medicine

## 2019-03-16 DIAGNOSIS — Z86711 Personal history of pulmonary embolism: Secondary | ICD-10-CM | POA: Insufficient documentation

## 2019-03-16 DIAGNOSIS — Z8709 Personal history of other diseases of the respiratory system: Secondary | ICD-10-CM | POA: Diagnosis not present

## 2019-03-16 DIAGNOSIS — D509 Iron deficiency anemia, unspecified: Secondary | ICD-10-CM | POA: Insufficient documentation

## 2019-03-16 DIAGNOSIS — Z9884 Bariatric surgery status: Secondary | ICD-10-CM | POA: Diagnosis not present

## 2019-03-16 DIAGNOSIS — E538 Deficiency of other specified B group vitamins: Secondary | ICD-10-CM | POA: Diagnosis not present

## 2019-03-16 DIAGNOSIS — R5383 Other fatigue: Secondary | ICD-10-CM | POA: Diagnosis not present

## 2019-03-16 LAB — COMPREHENSIVE METABOLIC PANEL
ALT: 15 U/L (ref 0–44)
AST: 16 U/L (ref 15–41)
Albumin: 3.7 g/dL (ref 3.5–5.0)
Alkaline Phosphatase: 59 U/L (ref 38–126)
Anion gap: 7 (ref 5–15)
BUN: 18 mg/dL (ref 6–20)
CO2: 22 mmol/L (ref 22–32)
Calcium: 8.8 mg/dL — ABNORMAL LOW (ref 8.9–10.3)
Chloride: 107 mmol/L (ref 98–111)
Creatinine, Ser: 0.62 mg/dL (ref 0.44–1.00)
GFR calc Af Amer: >60 mL/min (ref >60)
GFR calc non Af Amer: >60 mL/min (ref >60)
Glucose, Bld: 86 mg/dL (ref 70–99)
Potassium: 3.9 mmol/L (ref 3.5–5.1)
Sodium: 136 mmol/L (ref 135–145)
Total Bilirubin: 0.6 mg/dL (ref 0.3–1.2)
Total Protein: 6.5 g/dL (ref 6.5–8.1)

## 2019-03-16 LAB — CBC WITH DIFFERENTIAL/PLATELET
Abs Immature Granulocytes: 0.03 10*3/uL (ref 0.00–0.07)
Basophils Absolute: 0.1 10*3/uL (ref 0.0–0.1)
Basophils Relative: 1 %
Eosinophils Absolute: 0.3 10*3/uL (ref 0.0–0.5)
Eosinophils Relative: 4 %
HCT: 40 % (ref 36.0–46.0)
Hemoglobin: 13.3 g/dL (ref 12.0–15.0)
Immature Granulocytes: 0 %
Lymphocytes Relative: 29 %
Lymphs Abs: 2.2 10*3/uL (ref 0.7–4.0)
MCH: 29.9 pg (ref 26.0–34.0)
MCHC: 33.3 g/dL (ref 30.0–36.0)
MCV: 89.9 fL (ref 80.0–100.0)
Monocytes Absolute: 0.5 10*3/uL (ref 0.1–1.0)
Monocytes Relative: 7 %
Neutro Abs: 4.7 10*3/uL (ref 1.7–7.7)
Neutrophils Relative %: 59 %
Platelets: 242 10*3/uL (ref 150–400)
RBC: 4.45 MIL/uL (ref 3.87–5.11)
RDW: 13.2 % (ref 11.5–15.5)
WBC: 7.8 10*3/uL (ref 4.0–10.5)
nRBC: 0 % (ref 0.0–0.2)

## 2019-03-16 LAB — IRON AND TIBC
Iron: 133 ug/dL (ref 28–170)
Saturation Ratios: 40 % — ABNORMAL HIGH (ref 10.4–31.8)
TIBC: 334 ug/dL (ref 250–450)
UIBC: 201 ug/dL

## 2019-03-16 LAB — FERRITIN: Ferritin: 24 ng/mL (ref 11–307)

## 2019-03-16 NOTE — Progress Notes (Signed)
[facility]  [address], [address] [place], [place] [address] Phone: [phone]  Fax: [phone]   Clinic Day:  03/20/2019  Referring physician: Dorcas CarrowJohnson, Megan P, DO  Chief Complaint: Sharon Gibson is a 38 y.o. female with iron and B12 deficiency who is seen for 5 month assessment.   HPI: The patient was last seen in the hematology clinic on 11/15/2018 by Consuello MasseLauren Allen, NP. At that time, she was seen for a sick call visit secondary to left arm swelling s/p iron infusion. Ultrasound was negative for DVT.  She was felt to have an IV iron infiltration or cellulitis.  She started Bactrim twice daily for 5 days. She took Tylenol for pain.   She completed her antibiotics and was still having mild symptoms on 11/21/2018.  She was seen the ED on 12/12/2018 for chest pain and burning sensation in the center of her chest.  Chest CT angiogram revealed no evidence of a pulmonary embolism, or acute findings.   She was seen on 01/20/2019 for acute viral sinusitis.  She had a telemedicine visit with Dr. Laural BenesJohnson on 01/24/2019 for fever and upper respiratory symptoms.  She was treated with doxycycline and pseudoephedrine for an acute maxillary sinusitis.  Labs followed: 12/07/2018: Hemoglobin 13.5, hematocrit 44.7, MCV 94.3.  Ferritin 53. 12/11/2018: Hemoglobin 13.3, hematocrit 42.0, MCV 88.8.   02/21/2019: Hemoglobin 13.6, hematocrit 41.9, MCV 90.1.  Ferritin 26.  03/16/2019: Hemoglobin 13.3, hematocrit 40.0, MCV 90.9.  Ferritin 24.  Iron saturation 40% with a TIBC of 334.   During the interim, the patient says she "feels exhausted." She feels fatigued most of the time and there is no improvement when taking iron. She denies any trouble sleeping.   She reports that her sinuses have improved. The edema in her right arm from the previous treatment has gone down. She notes being concerned about coming in because of her previous reactions to treatment.  She reports having a  reactions to most medications prescribed. She is interested in trying Feraheme.  She does not want a port-a-cath.   She has an upcoming appointnment for her eating disorder, and she believes this will help her improve her health.    Past Medical History:  Diagnosis Date  . Anxiety   . Chicken pox   . Depression   . Eating disorder    Has had residential treatment and hospitalization previously.  Marland Kitchen. Heart murmur    as a child  . History of anemia    RECIEVES IRON INFUSIONS YEARLY  . History of pulmonary embolus (PE) 2002   bilaterally-[facility]-w/u negative per pt  . Hypothyroidism    no meds currently  . PTSD (post-traumatic stress disorder)     Past Surgical History:  Procedure Laterality Date  . CHOLECYSTECTOMY  2003  . EXCISION OF ENDOMETRIOMA    . GASTRIC BYPASS  2003  . LAPAROSCOPIC OVARIAN CYSTECTOMY Left 05/10/2018   Procedure: LAPAROSCOPIC OVARIAN CYSTECTOMY;  Surgeon: Natale MilchSchuman, Christanna R, MD;  Location: [facility] ORS;  Service: Gynecology;  Laterality: Left;    Family History  Problem Relation Age of Onset  . Diabetes Father   . Anxiety disorder Father   . Prostate cancer Paternal Grandfather   . Alzheimer's disease Maternal Grandmother   . Alzheimer's disease Maternal Grandfather   . Lung cancer Paternal Grandmother     Social History:  reports that she has never smoked. She has never used smokeless tobacco. She reports current alcohol use. She reports that she  does not use drugs. She has 1 alcohol beverage per week. She is single, but sexually active and is currently on IUD Mirena for birth control. The patient is alone today.  Allergies:  Allergies  Allergen Reactions  . Penicillins Hives    Has patient had a PCN reaction causing immediate rash, facial/tongue/throat swelling, SOB or lightheadedness with hypotension: yes Has patient had a PCN reaction causing severe rash involving mucus membranes or skin necrosis: no Has patient had a PCN  reaction that required hospitalization: yes Has patient had a PCN reaction occurring within the last 10 years: no If all of the above answers are "NO", then may proceed with Cephalosporin use.   . Azithromycin   . Other     Narcotics- Burning in the stomach (whether PO or IV)  . Strawberry (Diagnostic) Swelling    AND WALNUTS-TONGUE SWELLING    Current Medications: Current Outpatient Medications  Medication Sig Dispense Refill  . clonazePAM (KLONOPIN) 0.5 MG tablet Take 1 tablet (0.5 mg total) by mouth 2 (two) times daily as needed for anxiety. 60 tablet 1  . levonorgestrel (MIRENA) 20 MCG/24HR IUD 1 each by Intrauterine route once.    Marland Kitchen. levothyroxine (SYNTHROID, LEVOTHROID) 50 MCG tablet Take 1 tablet (50 mcg total) by mouth daily. 30 tablet 3  . Multiple Vitamin (MULTIVITAMIN) tablet Take 1 tablet by mouth daily.    . sertraline (ZOLOFT) 100 MG tablet Take 2 tablets (200 mg total) by mouth at bedtime. 180 tablet 1  . Vitamin D, Ergocalciferol, (DRISDOL) 1.25 MG (50000 UT) CAPS capsule Take 1 capsule (50,000 Units total) by mouth every 7 (seven) days. 12 capsule 3   No current facility-administered medications for this visit.     Review of Systems  Constitutional: Positive for malaise/fatigue. Negative for chills and fever.       Feels "exhausted".  No energy.  HENT: Negative.  Negative for congestion, ear discharge, ear pain, hearing loss, nosebleeds and sinus pain (improved).   Eyes: Negative.  Negative for blurred vision, double vision, photophobia and pain.  Respiratory: Negative.  Negative for cough, sputum production and shortness of breath.   Cardiovascular: Negative.  Negative for chest pain, palpitations and leg swelling.  Gastrointestinal: Negative.  Negative for abdominal pain, blood in stool, constipation, diarrhea, melena, nausea and vomiting.  Genitourinary: Negative.  Negative for dysuria, frequency, hematuria and urgency.  Musculoskeletal: Negative for back pain,  joint pain, myalgias and neck pain.  Skin: Negative.  Negative for itching and rash.  Neurological: Negative.  Negative for dizziness, tingling, sensory change, speech change, focal weakness, weakness and headaches.  Endo/Heme/Allergies: Negative.  Negative for environmental allergies. Does not bruise/bleed easily.  Psychiatric/Behavioral: Negative.  Negative for depression and memory loss. The patient is not nervous/anxious and does not have insomnia.    Performance status (ECOG): 1  Blood pressure 128/69, pulse 75, temperature (!) 97.2 F (36.2 C), temperature source Tympanic, resp. rate 18, weight 275 lb 7.4 oz (125 kg), SpO2 99 %.   Physical Exam  Constitutional: She is oriented to person, place, and time. She appears well-developed and well-nourished. No distress.  HENT:  Head: Normocephalic and atraumatic.  Mouth/Throat: No oropharyngeal exudate.  Long auburn hair.  Eyes: Pupils are equal, round, and reactive to light. Conjunctivae and EOM are normal. No scleral icterus.  Glasses.  Blue eyes.  Neck: Normal range of motion. Neck supple. No JVD present.  Cardiovascular: Normal rate, regular rhythm and normal heart sounds.  No murmur heard. Pulmonary/Chest: Effort normal  and breath sounds normal. She has no wheezes.  Abdominal: She exhibits no distension and no mass. There is no abdominal tenderness. There is no rebound and no guarding.  Musculoskeletal: Normal range of motion.        General: No tenderness or edema.  Lymphadenopathy:    She has no cervical adenopathy.    She has no axillary adenopathy.       Right: No inguinal and no supraclavicular adenopathy present.       Left: No inguinal and no supraclavicular adenopathy present.  Neurological: She is alert and oriented to person, place, and time.  Skin: Skin is warm and dry. No rash noted. She is not diaphoretic. No erythema. No pallor.  Psychiatric: She has a normal mood and affect. Her behavior is normal. Judgment and  thought content normal.     No visits with results within 3 Day(s) from this visit.  Latest known visit with results is:  Appointment on 03/16/2019  Component Date Value Ref Range Status  . Abbott SARS-CoV-2 Ab, IgG 03/16/2019 Negative  Negative Final   Comment: This sample does not contain detectable SARS-CoV-2 IgG antibodies. This negative result does not rule out SARS-CoV-2 infection. Correlation with epidemiologic risk factors and other clinical and laboratory findings is recommended. Serologic results should not be used as the sole basis to diagnose or exclude recent SARS-CoV-2 infection. This assay was performed using the Abbott SARS-CoV-2 IgG assay.     Assessment:  Sharon Gibson is a 38 y.o. female with B12 and iron deficiency anemia s/p gastric bypass surgery (2004).  She has received IV iron (different preparations) x 5 ([place], [place]; [place], [place];  [place], [place]) with reactions requiring Benadryl and steroids.  Labs on 03/20/2016 revealed a normal hematocrit but iron deficiency (ferritin 8).  B12 was 185 (low).  She began B12 on 04/07/2016.  She received Venofer 200 mg IV with premedications (Tylenol, Benadryl, and Decadron) on 05/13/2016.  She had side effects of dizziness, lightheadedness, low blood pressure, excess sweating, aches, and pains, nausea, and headache after her infusion.  She has received Venofer: 12/31/2016 and weekly x 3 (10/27/2018 - 11/10/2018).  Ferritin has been followed: 7.9 on 03/20/2016, 8 on 04/08/2016, 22 on 07/08/2016, 18 on 10/07/2016, 10 on 12/16/2016, 13 on 10/14/2018, 53 on 12/07/2018, 26 on 02/21/2019, and 24 on 03/16/2019.  She has B12 deficiency.  B12 was 323 on 10/14/2018.  She receives B12 monthly (last 12/07/2018).  She has a history of multiple pulmonary emboli in 2003.  Per patient report, hypercoagulable work-up was negative.  She was on Coumadin x 6 months.  She has a history of easy bruising and heavy menses.  She has an  IUD.  Symptomatically, she notes fatigue.  Exam is normal.  Ferritin is 24.  Plan: 1.   Review labs from 03/16/2019. 2.   Iron deficiency             Hematocrit 40.0.  Hemoglobin 13.3.  MCV 89.9.             Ferritin 24.  Iron saturation 40%  Patient notes fatigue, but unclear if related to low iron stores as patient is not anemic.  Discuss thoughts about additional IV iron given prior reaction.   Patient noted prior reaction to many medications.   Discuss patient's decision to pursue Feraheme rather than Venofer if IV iron needed.   Preauth Feraheme.             No Venofer today.  Review plan  to replete iron stores.  Ferritin goal 100.             Discuss prior plan for IV iron if ferritin < 30.             Plan premeds with IV iron:  Tylenol 650 mg, Benadryl 50 mg, and Decadron 10 mg secondary to history of reaction.  Discuss possible port-a-cath secondary to IV access issues.  Patient declines. 3.   B12 deficiency             B12 was 323 on 10/14/2018.             Continue B12. 4.   Fatigue  Etiology unclear.  Consider sleep apnea, thyroid dysfunction, other.  Check TSH and free T4. 5.   RTC in 1 month for MD assessment and labs (CBC with diff, ferritin, iron studies-day before) and +/- Feraheme.  I discussed the assessment and treatment plan with the patient.  The patient was provided an opportunity to ask questions and all were answered.  The patient agreed with the plan and demonstrated an understanding of the instructions.  The patient was advised to call back if the symptoms worsen or if the condition fails to improve as anticipated.  I provided 20 minutes of face-to-face time during this this encounter and > 50% was spent counseling as documented under my assessment and plan.    Rosey BathMelissa C Corcoran, MD, PhD    03/20/2019, 9:08 AM  I, Theador HawthorneAlexis Patterson, am acting as scribe for General MotorsMelissa C. Merlene Pullingorcoran, MD, PhD.  I, Melissa C. Merlene Pullingorcoran, MD, have reviewed the above documentation  for accuracy and completeness, and I agree with the above.

## 2019-03-17 ENCOUNTER — Other Ambulatory Visit: Payer: BLUE CROSS/BLUE SHIELD

## 2019-03-17 LAB — SAR COV2 SEROLOGY (COVID19)AB(IGG),IA: SARS-CoV-2 Ab, IgG: NEGATIVE

## 2019-03-20 ENCOUNTER — Encounter: Payer: Self-pay | Admitting: Hematology and Oncology

## 2019-03-20 ENCOUNTER — Other Ambulatory Visit: Payer: Self-pay

## 2019-03-20 ENCOUNTER — Inpatient Hospital Stay (HOSPITAL_BASED_OUTPATIENT_CLINIC_OR_DEPARTMENT_OTHER): Payer: BLUE CROSS/BLUE SHIELD | Admitting: Hematology and Oncology

## 2019-03-20 ENCOUNTER — Ambulatory Visit: Payer: BLUE CROSS/BLUE SHIELD

## 2019-03-20 ENCOUNTER — Inpatient Hospital Stay: Payer: BLUE CROSS/BLUE SHIELD

## 2019-03-20 VITALS — BP 128/69 | HR 75 | Temp 97.2°F | Resp 18 | Wt 275.5 lb

## 2019-03-20 DIAGNOSIS — D508 Other iron deficiency anemias: Secondary | ICD-10-CM

## 2019-03-20 DIAGNOSIS — Z9884 Bariatric surgery status: Secondary | ICD-10-CM | POA: Diagnosis not present

## 2019-03-20 DIAGNOSIS — R5383 Other fatigue: Secondary | ICD-10-CM

## 2019-03-20 DIAGNOSIS — E538 Deficiency of other specified B group vitamins: Secondary | ICD-10-CM | POA: Diagnosis not present

## 2019-03-20 DIAGNOSIS — Z86711 Personal history of pulmonary embolism: Secondary | ICD-10-CM

## 2019-03-20 DIAGNOSIS — K9589 Other complications of other bariatric procedure: Secondary | ICD-10-CM

## 2019-03-20 DIAGNOSIS — D509 Iron deficiency anemia, unspecified: Secondary | ICD-10-CM | POA: Diagnosis not present

## 2019-03-20 LAB — T4, FREE: Free T4: 0.65 ng/dL (ref 0.61–1.12)

## 2019-03-20 NOTE — Patient Instructions (Signed)

## 2019-03-20 NOTE — Progress Notes (Signed)
Pt here for follow up. Reports increase in fatigue and no energy. Denies any other concerns.

## 2019-03-21 LAB — THYROID PANEL WITH TSH
Free Thyroxine Index: 1.6 (ref 1.2–4.9)
T3 Uptake Ratio: 24 % (ref 24–39)
T4, Total: 6.7 ug/dL (ref 4.5–12.0)
TSH: 6.51 u[IU]/mL — ABNORMAL HIGH (ref 0.450–4.500)

## 2019-03-28 ENCOUNTER — Telehealth: Payer: Self-pay | Admitting: Family Medicine

## 2019-03-28 DIAGNOSIS — Z8639 Personal history of other endocrine, nutritional and metabolic disease: Secondary | ICD-10-CM

## 2019-03-28 MED ORDER — LEVOTHYROXINE SODIUM 75 MCG PO TABS: 75.0000 ug | ORAL_TABLET | Freq: Every day | ORAL | 2 refills | Status: DC

## 2019-03-28 NOTE — Telephone Encounter (Signed)
-----   Message from Arlan Organ, RN sent at 03/21/2019  3:51 PM EDT ----- Per Dr. Kem Parkinson request. Forwarding for you to review.

## 2019-03-28 NOTE — Telephone Encounter (Signed)
Please let her know that I got her results of her thyroid from Dr. Mike Gip. It's better, but still under treated. I'd like her to increase to 9mcg and I've sent a new Rx to her pharmacy for that dose. I'd also like her to come back in in 6 weeks for a recheck on her thyroid. Thanks!

## 2019-03-28 NOTE — Telephone Encounter (Signed)
Tried again, no answer. LVM for patient to return phone call.

## 2019-03-28 NOTE — Telephone Encounter (Signed)
Tried calling, line busy

## 2019-03-29 MED ORDER — ACETAMINOPHEN 500 MG PO TABS
ORAL_TABLET | ORAL | Status: AC
  Filled 2019-03-29: qty 2

## 2019-03-29 NOTE — Telephone Encounter (Signed)
Called patient. No answer. Left detailed message (DPR reviewed) advised patient to give [place] a call back with any questions and to schedule her 6 weeks f/u.

## 2019-03-29 NOTE — Telephone Encounter (Signed)
Called patient. No answer. Left VM for patient to return call to the office.  

## 2019-04-07 ENCOUNTER — Encounter: Payer: Self-pay | Admitting: Nurse Practitioner

## 2019-04-07 ENCOUNTER — Telehealth: Payer: Self-pay | Admitting: *Deleted

## 2019-04-07 ENCOUNTER — Other Ambulatory Visit: Payer: Self-pay

## 2019-04-07 ENCOUNTER — Ambulatory Visit (INDEPENDENT_AMBULATORY_CARE_PROVIDER_SITE_OTHER): Payer: BLUE CROSS/BLUE SHIELD | Admitting: Nurse Practitioner

## 2019-04-07 DIAGNOSIS — R630 Anorexia: Secondary | ICD-10-CM

## 2019-04-07 DIAGNOSIS — Z20822 Contact with and (suspected) exposure to covid-19: Secondary | ICD-10-CM | POA: Insufficient documentation

## 2019-04-07 DIAGNOSIS — R509 Fever, unspecified: Secondary | ICD-10-CM

## 2019-04-07 DIAGNOSIS — R109 Unspecified abdominal pain: Secondary | ICD-10-CM

## 2019-04-07 DIAGNOSIS — J3489 Other specified disorders of nose and nasal sinuses: Secondary | ICD-10-CM

## 2019-04-07 DIAGNOSIS — R05 Cough: Secondary | ICD-10-CM | POA: Diagnosis not present

## 2019-04-07 DIAGNOSIS — Z20828 Contact with and (suspected) exposure to other viral communicable diseases: Secondary | ICD-10-CM

## 2019-04-07 NOTE — Assessment & Plan Note (Signed)
Message sent to [facility] team for outpatient testing due to recent possible exposure.  Have recommended maintaining a 14 day at home quarantine at this time if Covid testing returns positive and at least to maintain quarantine until testing has returned, if it returns negative and symptoms improve (including afebrile) they may continue precautions while going out of home but if symptoms present then they are to immediately quarantine.  Patient agrees with this POC.  They will return in 2 weeks for Covid follow-up.

## 2019-04-07 NOTE — Progress Notes (Signed)
Letter for testing

## 2019-04-07 NOTE — Telephone Encounter (Signed)
-----   Message from Venita Lick, NP sent at 04/07/2019  1:24 PM EDT ----- Name: Sharon Gibson  DOB: Nov 23, 1980  MRN: [id number]  Insurance: [employer]  Reason:  possible work exposure to [employer] patient and 7 days later this patient now has symptoms runny nose, fever, loss of appetite, fatigue, and abdominal pain.  Would benefit from testing.

## 2019-04-07 NOTE — Patient Instructions (Signed)

## 2019-04-07 NOTE — Progress Notes (Signed)
BP (!) 118/57 (BP Location: Left Arm, Patient Position: Sitting, Cuff Size: Normal)   Pulse 70   Temp 99.5 F (37.5 C) (Oral)   Ht 5\' 3"  (1.6 m)   SpO2 97%   BMI 48.80 kg/m    Subjective:    Patient ID: Sharon CatenaKristen Urieta, female    DOB: [birth year], 38 y.o.   MRN: [id number]  HPI: Sharon Gibson is a 38 y.o. female  Chief Complaint  Patient presents with  . Chills    Ongoing 2 days. 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  . Generalized Body Aches  . Fever  . Abdominal Pain    . This visit was completed via FaceTime due to the restrictions of the COVID-19 pandemic. All issues as above were discussed and addressed. Physical exam was done as above through visual confirmation on FaceTime. If it was felt that the patient should be evaluated in the office, they were directed there. The patient verbally consented to this visit. . Location of the patient: home . Location of the provider: home . Those involved with this call:  . Provider: Aura DialsJolene Cannady, DNP . CMA: Myrtha MantisKeri Bullock, CMA . Front Desk/Registration: Harriet PhoJoliza Johnson  . Time spent on call: 15 minutes with patient face to face via video conference. More than 50% of this time was spent in counseling and coordination of care. 10 minutes total spent in review of patient's record and preparation of their chart.  . I verified patient identity using two factors (patient name and date of birth). Patient consents verbally to being seen via telemedicine visit today.    POSSIBLE COVID EXPOSURE Had a [facility] out sick [facility] but [facility] not [facility] Cov[facility]. Was back for a week and then started having symptoms, this was 7 days ago and patient had been around coworker. Patient started having symptoms a couple days ago, Wednesday night had chills (99.2 temp).  Continues to have temperature today 99.3-99.5 plus runny nose, decreased appetite, and abdominal pain. Denies loss  of taste or smell.  Reports overall fatigue and not feeling well.   Fever: yes Cough: yes Shortness of breath: no Wheezing: no Chest pain: no Chest tightness: no Chest congestion: no Nasal congestion: yes Runny nose: yes Post nasal drip: no Sneezing: no Sore throat: no Swollen glands: no Sinus pressure: no Headache: no Face pain: no Toothache: no Ear pain: none Ear pressure: none Eyes red/itching:no Eye drainage/crusting: no  Vomiting: no Rash: no Fatigue: yes Sick contacts: yes  Relevant past medical, surgical, family and social history reviewed and updated as indicated. Interim medical history since our last visit reviewed. Allergies and medications reviewed and updated.  Review of Systems  Constitutional: Positive for chills, fatigue and fever. Negative for activity change, appetite change and diaphoresis.  HENT: Positive for congestion and rhinorrhea. Negative for ear discharge, ear pain, facial swelling, postnasal drip, sinus pressure, sinus pain, sneezing, sore throat and voice change.   Eyes: Negative for pain and visual disturbance.  Respiratory: Positive for cough. Negative for chest tightness, shortness of breath and wheezing.   Cardiovascular: Negative for chest pain, palpitations and leg swelling.  Gastrointestinal: Positive for abdominal pain (mild). Negative for abdominal distention, constipation, diarrhea, nausea and vomiting.  Endocrine: Negative.   Musculoskeletal: Positive for myalgias.  Neurological: Negative for dizziness, numbness and headaches.  Psychiatric/Behavioral: Negative.     Per HPI unless specifically indicated above     Objective:  BP (!) 118/57 (BP Location: Left Arm, Patient Position: Sitting, Cuff Size: Normal)   Pulse 70   Temp 99.5 F (37.5 C) (Oral)   Ht 5\' 3"  (1.6 m)   SpO2 97%   BMI 48.80 kg/m   Wt Readings from Last 3 Encounters:  03/20/19 275 lb 7.4 oz (125 kg)  12/30/18 240 lb (108.9 kg)  12/11/18 250 lb (113.4  kg)    Physical Exam Vitals signs and nursing note reviewed.  Constitutional:      General: She is awake. She is not in acute distress.    Appearance: She is well-developed. She is not ill-appearing.  HENT:     Head: Normocephalic.     Right Ear: Hearing normal.     Left Ear: Hearing normal.  Eyes:     General: Lids are normal.        Right eye: No discharge.        Left eye: No discharge.     Conjunctiva/sclera: Conjunctivae normal.  Neck:     Musculoskeletal: Normal range of motion.  Cardiovascular:     Comments: Unable to auscultate due to virtual exam only  Pulmonary:     Effort: Pulmonary effort is normal. No accessory muscle usage or respiratory distress.     Comments: Unable to auscultate due to virtual exam only. No SOB with talking.  No cough noted during exam period. Abdominal:     Tenderness: There is no abdominal tenderness.     Comments: Denies abdominal pain with self palpation.  Unable to auscultate due to virtual exam only.  Neurological:     Mental Status: She is alert and oriented to person, place, and time.  Psychiatric:        Attention and Perception: Attention normal.        Mood and Affect: Mood normal.        Behavior: Behavior normal. Behavior is cooperative.        Thought Content: Thought content normal.        Judgment: Judgment normal.     Results for orders placed or performed in visit on 03/20/19  T4, free  Result Value Ref Range   Free T4 0.65 0.61 - 1.12 ng/dL  Thyroid Panel With TSH  Result Value Ref Range   TSH 6.510 (H) 0.450 - 4.500 uIU/mL   T4, Total 6.7 4.5 - 12.0 ug/dL   T3 Uptake Ratio 24 24 - 39 %   Free Thyroxine Index 1.6 1.2 - 4.9      Assessment & Plan:   Problem List Items Addressed This Visit      Other   Exposure to Covid-19 Virus    Message sent to [facility] team for outpatient testing due to recent possible exposure.  Have recommended maintaining a 14 day at home quarantine at this time if Covid testing returns  positive and at least to maintain quarantine until testing has returned, if it returns negative and symptoms improve (including afebrile) they may continue precautions while going out of home but if symptoms present then they are to immediately quarantine.  Patient agrees with this POC.  They will return in 2 weeks for Covid follow-up.         I discussed the assessment and treatment plan with the patient. The patient was provided an opportunity to ask questions and all were answered. The patient agreed with the plan and demonstrated an understanding of the instructions.   The patient was advised to call back or seek  an in-person evaluation if the symptoms worsen or if the condition fails to improve as anticipated.   I provided 15 minutes of time during this encounter.  Follow up plan: Return in about 2 weeks (around 04/21/2019) for Covid exposure follow-up.

## 2019-04-07 NOTE — Telephone Encounter (Signed)
Scheduled for covid testing today @ 3:15 @ [facility]. Instructions given and order placed

## 2019-04-13 ENCOUNTER — Other Ambulatory Visit: Payer: Self-pay

## 2019-04-13 LAB — NOVEL CORONAVIRUS, NAA: SARS-CoV-2, NAA: NOT DETECTED

## 2019-04-14 ENCOUNTER — Inpatient Hospital Stay: Payer: BLUE CROSS/BLUE SHIELD | Attending: Hematology and Oncology

## 2019-04-16 DIAGNOSIS — R5383 Other fatigue: Secondary | ICD-10-CM | POA: Insufficient documentation

## 2019-04-17 ENCOUNTER — Inpatient Hospital Stay: Payer: BLUE CROSS/BLUE SHIELD | Admitting: Hematology and Oncology

## 2019-04-17 ENCOUNTER — Inpatient Hospital Stay: Payer: BLUE CROSS/BLUE SHIELD

## 2019-04-21 ENCOUNTER — Ambulatory Visit: Payer: BLUE CROSS/BLUE SHIELD | Admitting: Nurse Practitioner

## 2019-05-19 ENCOUNTER — Other Ambulatory Visit: Payer: Self-pay | Admitting: Family Medicine

## 2019-05-19 NOTE — Telephone Encounter (Signed)
Routing to provider  

## 2019-05-19 NOTE — Telephone Encounter (Signed)
Patient's mother calling to check status of this request.

## 2019-05-19 NOTE — Telephone Encounter (Signed)
Needs appointment

## 2019-05-22 ENCOUNTER — Other Ambulatory Visit: Payer: Self-pay

## 2019-05-22 ENCOUNTER — Ambulatory Visit (INDEPENDENT_AMBULATORY_CARE_PROVIDER_SITE_OTHER): Payer: BLUE CROSS/BLUE SHIELD | Admitting: Family Medicine

## 2019-05-22 ENCOUNTER — Encounter: Payer: Self-pay | Admitting: Family Medicine

## 2019-05-22 DIAGNOSIS — F332 Major depressive disorder, recurrent severe without psychotic features: Secondary | ICD-10-CM | POA: Diagnosis not present

## 2019-05-22 DIAGNOSIS — F419 Anxiety disorder, unspecified: Secondary | ICD-10-CM | POA: Diagnosis not present

## 2019-05-22 DIAGNOSIS — F329 Major depressive disorder, single episode, unspecified: Secondary | ICD-10-CM | POA: Diagnosis not present

## 2019-05-22 DIAGNOSIS — F32A Depression, unspecified: Secondary | ICD-10-CM

## 2019-05-22 MED ORDER — CLONAZEPAM 0.5 MG PO TABS: 0.5000 mg | ORAL_TABLET | Freq: Two times a day (BID) | ORAL | 1 refills | Status: DC | PRN

## 2019-05-22 MED ORDER — SERTRALINE HCL 100 MG PO TABS: 200.0000 mg | ORAL_TABLET | Freq: Every day | ORAL | 1 refills | Status: DC

## 2019-05-22 MED ORDER — BUPROPION HCL ER (SR) 150 MG PO TB12: ORAL_TABLET | ORAL | 3 refills | Status: DC

## 2019-05-22 NOTE — Telephone Encounter (Signed)
Appt made for today

## 2019-05-22 NOTE — Progress Notes (Signed)
BP 108/75   Pulse (!) 44   Temp 99.5 F (37.5 C) (Oral)   Ht 5\' 3"  (1.6 m)   SpO2 95%    Subjective:    Patient ID: Sharon Gibson, female    DOB: 1981/03/13, 38 y.o.   MRN: [id number]  HPI: Sharon Gibson is a 38 y.o. female  Chief Complaint  Patient presents with  . Depression    zoloft refill  . Anxiety    pt states tha tshe has been out of klonopin for about 2 months now   ANXIETY/DEPRESSION- did the 2 hour intake with [facility]. She couldn't meet with them during 8-4PM, they were going to refer her to another psychiatrist, but then nothing happened. She has not been doing well. She has been binge eating. She does not feel like her eating disorder is under good control.  Duration:exacerbated Anxious mood: yes  Excessive worrying: yes Irritability: yes  Sweating: no Nausea: no Palpitations:yes Hyperventilation: yes Panic attacks: yes Agoraphobia: no  Obscessions/compulsions: yes Depressed mood: yes Depression screen [facility] 2/9 05/23/2019 12/19/2018 10/14/2018  Decreased Interest 3 3 2   Down, Depressed, Hopeless 3 3 3   PHQ - 2 Score 6 6 5   Altered sleeping 3 3 3   Tired, decreased energy 3 3 3   Change in appetite 3 3 3   Feeling bad or failure about yourself  3 3 3   Trouble concentrating 3 3 3   Moving slowly or fidgety/restless 2 0 0  Suicidal thoughts 3 1 1   PHQ-9 Score 26 22 21   Difficult doing work/chores Extremely dIfficult Very difficult -   GAD 7 : Generalized Anxiety Score 05/23/2019 12/19/2018 10/14/2018  Nervous, Anxious, on Edge 2 3 3   Control/stop worrying 2 3 3   Worry too much - different things 2 3 3   Trouble relaxing 3 3 3   Restless 3 3 3   Easily annoyed or irritable 1 1 2   Afraid - awful might happen 2 3 2   Total GAD 7 Score 15 19 19   Anxiety Difficulty Extremely difficult Extremely difficult Somewhat difficult   Anhedonia: no Weight changes: yes Insomnia: no   Hypersomnia: yes Fatigue/loss of energy: yes Feelings of  worthlessness: yes Feelings of guilt: yes Impaired concentration/indecisiveness: yes Suicidal ideations: yes  Crying spells: yes Recent Stressors/Life Changes: yes   Family stress: yes     Financial stress: yes    Job stress: yes    Recent death/loss: no  Relevant past medical, surgical, family and social history reviewed and updated as indicated. Interim medical history since our last visit reviewed. Allergies and medications reviewed and updated.  Review of Systems  Constitutional: Negative.   Respiratory: Negative.   Cardiovascular: Negative.   Musculoskeletal: Negative.   Skin: Negative.   Psychiatric/Behavioral: Positive for decreased concentration, dysphoric mood, sleep disturbance and suicidal ideas. Negative for agitation, behavioral problems, confusion, hallucinations and self-injury. The patient is nervous/anxious. The patient is not hyperactive.     Per HPI unless specifically indicated above     Objective:    BP 108/75   Pulse (!) 44   Temp 99.5 F (37.5 C) (Oral)   Ht 5\' 3"  (1.6 m)   SpO2 95%   BMI 48.80 kg/m   Wt Readings from Last 3 Encounters:  03/20/19 275 lb 7.4 oz (125 kg)  12/30/18 240 lb (108.9 kg)  12/11/18 250 lb (113.4 kg)    Physical Exam Vitals signs and nursing note reviewed.  Constitutional:      General: She is not in  acute distress.    Appearance: Normal appearance. She is not ill-appearing, toxic-appearing or diaphoretic.  HENT:     Head: Normocephalic and atraumatic.     Right Ear: External ear normal.     Left Ear: External ear normal.     Nose: Nose normal.     Mouth/Throat:     Mouth: Mucous membranes are moist.     Pharynx: Oropharynx is clear.  Eyes:     General: No scleral icterus.       Right eye: No discharge.        Left eye: No discharge.     Extraocular Movements: Extraocular movements intact.     Conjunctiva/sclera: Conjunctivae normal.     Pupils: Pupils are equal, round, and reactive to light.  Neck:      Musculoskeletal: Normal range of motion and neck supple.  Cardiovascular:     Rate and Rhythm: Normal rate and regular rhythm.     Pulses: Normal pulses.     Heart sounds: Normal heart sounds. No murmur. No friction rub. No gallop.   Pulmonary:     Effort: Pulmonary effort is normal. No respiratory distress.     Breath sounds: Normal breath sounds. No stridor. No wheezing, rhonchi or rales.  Chest:     Chest wall: No tenderness.  Musculoskeletal: Normal range of motion.  Skin:    General: Skin is warm and dry.     Capillary Refill: Capillary refill takes less than 2 seconds.     Coloration: Skin is not jaundiced or pale.     Findings: No bruising, erythema, lesion or rash.  Neurological:     General: No focal deficit present.     Mental Status: She is alert and oriented to person, place, and time. Mental status is at baseline.  Psychiatric:        Mood and Affect: Mood is anxious and depressed.        Behavior: Behavior normal.        Thought Content: Thought content normal.        Judgment: Judgment normal.     Results for orders placed or performed in visit on 04/07/19  Novel Coronavirus, NAA ([facility])  Result Value Ref Range   SARS-CoV-2, NAA Not Detected Not Detected      Assessment & Plan:   Problem List Items Addressed This Visit      Other   Severe recurrent major depression without psychotic features (HCC)    Not doing well. Will try to get her into see psychiatry and list of counselors who take her insurance and treat eating disorders given today. We will continue her zoloft and her clonazepam and we will start her on wellbutrin. Recheck 2 weeks. Call with any concerns. If still having problems, will consider using vyvanse for the binge eating disorder.       Relevant Medications   sertraline (ZOLOFT) 100 MG tablet   buPROPion (WELLBUTRIN SR) 150 MG 12 hr tablet    Other Visit Diagnoses    Anxiety and depression       Relevant Medications   sertraline  (ZOLOFT) 100 MG tablet   clonazePAM (KLONOPIN) 0.5 MG tablet   buPROPion (WELLBUTRIN SR) 150 MG 12 hr tablet       Follow up plan: Return in about 2 weeks (around 06/05/2019).

## 2019-05-22 NOTE — Patient Instructions (Signed)
Please have pt call Jonelle Sidle) directly M-TH 8:30-5 and F 8:30-noon at [phone] for scheduling.

## 2019-05-23 ENCOUNTER — Encounter: Payer: Self-pay | Admitting: Family Medicine

## 2019-05-23 NOTE — Assessment & Plan Note (Signed)
Not doing well. Will try to get her into see psychiatry and list of counselors who take her insurance and treat eating disorders given today. We will continue her zoloft and her clonazepam and we will start her on wellbutrin. Recheck 2 weeks. Call with any concerns. If still having problems, will consider using vyvanse for the binge eating disorder.

## 2019-06-06 ENCOUNTER — Other Ambulatory Visit: Payer: Self-pay

## 2019-06-06 ENCOUNTER — Ambulatory Visit (INDEPENDENT_AMBULATORY_CARE_PROVIDER_SITE_OTHER): Payer: BLUE CROSS/BLUE SHIELD | Admitting: Family Medicine

## 2019-06-06 ENCOUNTER — Encounter: Payer: Self-pay | Admitting: Family Medicine

## 2019-06-06 DIAGNOSIS — F332 Major depressive disorder, recurrent severe without psychotic features: Secondary | ICD-10-CM

## 2019-06-06 DIAGNOSIS — Z20822 Contact with and (suspected) exposure to covid-19: Secondary | ICD-10-CM

## 2019-06-06 DIAGNOSIS — J069 Acute upper respiratory infection, unspecified: Secondary | ICD-10-CM

## 2019-06-06 NOTE — Progress Notes (Signed)
There were no vitals taken for this visit.   Subjective:    Patient ID: Sharon Gibson, female    DOB: [birth year], 38 y.o.   MRN: [id number]  HPI: Sharon Gibson is a 38 y.o. female  Chief Complaint  Patient presents with  . Depression  . Anxiety  . Fever    low grade, cold, scratchy throat, runny nose, ear pain, diarrhea   DEPRESSION Mood status: better Satisfied with current treatment?: yes Symptom severity: mild  Duration of current treatment : 2 weeks Side effects: no Medication compliance: excellent compliance Psychotherapy/counseling: yes in the past Previous psychiatric medications: sertraline, wellbutrin, klonopin Depressed mood: yes Anxious mood: yes Anhedonia: no Significant weight loss or gain: no Insomnia: no  Fatigue: yes Feelings of worthlessness or guilt: yes Impaired concentration/indecisiveness: no Suicidal ideations: no Hopelessness: no Crying spells: no Depression screen [facility] 2/9 06/06/2019 05/23/2019 12/19/2018 10/14/2018  Decreased Interest 1 3 3 2   Down, Depressed, Hopeless 1 3 3 3   PHQ - 2 Score 2 6 6 5   Altered sleeping 0 3 3 3   Tired, decreased energy 0 3 3 3   Change in appetite 3 3 3 3   Feeling bad or failure about yourself  2 3 3 3   Trouble concentrating 0 3 3 3   Moving slowly or fidgety/restless 0 2 0 0  Suicidal thoughts 0 3 1 1   PHQ-9 Score 7 26 22 21   Difficult doing work/chores Not difficult at all Extremely dIfficult Very difficult -   GAD 7 : Generalized Anxiety Score 06/06/2019 05/23/2019 12/19/2018 10/14/2018  Nervous, Anxious, on Edge 1 2 3 3   Control/stop worrying 1 2 3 3   Worry too much - different things 1 2 3 3   Trouble relaxing 1 3 3 3   Restless 0 3 3 3   Easily annoyed or irritable 0 1 1 2   Afraid - awful might happen 1 2 3 2   Total GAD 7 Score 5 15 19 19   Anxiety Difficulty Not difficult at all Extremely difficult Extremely difficult Somewhat difficult    UPPER RESPIRATORY TRACT INFECTION Duration: 3 days Worst  symptom: chills Fever: low grade- 99.6-99.9 Cough: no Shortness of breath: no Wheezing: no Chest pain: no Chest tightness: no Chest congestion: no Nasal congestion: yes Runny nose: yes Post nasal drip: yes Sneezing: no Sore throat: yes Swollen glands: no Sinus pressure: no Headache: no Face pain: no Toothache: no Ear pain: no  Ear pressure: yes bilateral Eyes red/itching:no Eye drainage/crusting: no  Vomiting: no Rash: no Fatigue: yes Sick contacts: no Strep contacts: no  Context: stable Recurrent sinusitis: no Relief with OTC cold/cough medications: no  Treatments attempted: ibuprofen    Relevant past medical, surgical, family and social history reviewed and updated as indicated. Interim medical history since our last visit reviewed. Allergies and medications reviewed and updated.  Review of Systems  Constitutional: Positive for chills and fever. Negative for activity change, appetite change, diaphoresis, fatigue and unexpected weight change.  HENT: Positive for congestion, ear pain, postnasal drip, rhinorrhea and sore throat. Negative for dental problem, drooling, ear discharge, facial swelling, hearing loss, mouth sores, nosebleeds, sinus pressure, sinus pain, sneezing, tinnitus, trouble swallowing and voice change.   Eyes: Negative.   Respiratory: Negative.   Cardiovascular: Negative.   Gastrointestinal: Negative.   Musculoskeletal: Positive for myalgias. Negative for arthralgias, back pain, gait problem, joint swelling, neck pain and neck stiffness.  Skin: Negative.   Psychiatric/Behavioral: Positive for dysphoric mood. Negative for agitation, behavioral problems, confusion, decreased concentration, hallucinations, self-injury,  sleep disturbance and suicidal ideas. The patient is nervous/anxious. The patient is not hyperactive.     Per HPI unless specifically indicated above     Objective:    There were no vitals taken for this visit.  Wt Readings from Last  3 Encounters:  03/20/19 275 lb 7.4 oz (125 kg)  12/30/18 240 lb (108.9 kg)  12/11/18 250 lb (113.4 kg)    Physical Exam Vitals signs and nursing note reviewed.  Constitutional:      General: She is not in acute distress.    Appearance: Normal appearance. She is not ill-appearing, toxic-appearing or diaphoretic.  HENT:     Head: Normocephalic and atraumatic.     Right Ear: External ear normal.     Left Ear: External ear normal.     Nose: Nose normal.     Mouth/Throat:     Mouth: Mucous membranes are moist.     Pharynx: Oropharynx is clear.  Eyes:     General: No scleral icterus.       Right eye: No discharge.        Left eye: No discharge.     Conjunctiva/sclera: Conjunctivae normal.     Pupils: Pupils are equal, round, and reactive to light.  Neck:     Musculoskeletal: Normal range of motion.  Pulmonary:     Effort: Pulmonary effort is normal. No respiratory distress.     Comments: Speaking in full sentences Musculoskeletal: Normal range of motion.  Skin:    Coloration: Skin is not jaundiced or pale.     Findings: No bruising, erythema, lesion or rash.  Neurological:     Mental Status: She is alert and oriented to person, place, and time. Mental status is at baseline.  Psychiatric:        Mood and Affect: Mood normal.        Behavior: Behavior normal.        Thought Content: Thought content normal.        Judgment: Judgment normal.     Results for orders placed or performed in visit on 04/07/19  Novel Coronavirus, NAA ([facility])  Result Value Ref Range   SARS-CoV-2, NAA Not Detected Not Detected      Assessment & Plan:   Problem List Items Addressed This Visit      Other   Severe recurrent major depression without psychotic features (HCC)    Doing much better with the addition of wellbutrin. Will continue current regimen and recheck 2-3 weeks. Call with any concerns.        Other Visit Diagnoses    Upper respiratory tract infection, unspecified type    -   Primary   WIll get COVID testing- await results. Call with any concerns. Self-quarantine for now. Call with any concerns. Continue to monitor.    Relevant Orders   Novel Coronavirus, NAA ([facility])   Temperature monitoring       Follow up plan: Return 2-3 weeks, for follow up mood.   . This visit was completed via FaceTime due to the restrictions of the COVID-19 pandemic. All issues as above were discussed and addressed. Physical exam was done as above through visual confirmation on FaceTime. If it was felt that the patient should be evaluated in the office, they were directed there. The patient verbally consented to this visit. . Location of the patient: parking lot . Location of the provider: work . Those involved with this call:  . Provider: Olevia PerchesMegan , DO . CMA: Tiffany Reel,  CMA . Front Desk/Registration: Don Perking  . Time spent on call: 23 minutes with patient face to face via video conference. More than 50% of this time was spent in counseling and coordination of care. 40 minutes total spent in review of patient's record and preparation of their chart.

## 2019-06-06 NOTE — Assessment & Plan Note (Signed)
Doing much better with the addition of wellbutrin. Will continue current regimen and recheck 2-3 weeks. Call with any concerns.

## 2019-06-08 ENCOUNTER — Encounter: Payer: Self-pay | Admitting: Family Medicine

## 2019-06-08 LAB — NOVEL CORONAVIRUS, NAA: SARS-CoV-2, NAA: NOT DETECTED

## 2019-06-20 ENCOUNTER — Ambulatory Visit (INDEPENDENT_AMBULATORY_CARE_PROVIDER_SITE_OTHER): Payer: Self-pay | Admitting: Family Medicine

## 2019-06-20 ENCOUNTER — Encounter: Payer: Self-pay | Admitting: Family Medicine

## 2019-06-20 ENCOUNTER — Other Ambulatory Visit: Payer: Self-pay

## 2019-06-20 DIAGNOSIS — R21 Rash and other nonspecific skin eruption: Secondary | ICD-10-CM

## 2019-06-20 MED ORDER — TRIAMCINOLONE ACETONIDE 0.1 % EX CREA: 1.0000 "application " | TOPICAL_CREAM | Freq: Two times a day (BID) | CUTANEOUS | 0 refills | Status: DC

## 2019-06-20 NOTE — Progress Notes (Signed)
There were no vitals taken for this visit.   Subjective:    Patient ID: Sharon Gibson, female    DOB: [birth year], 38 y.o.   MRN: [id number]  HPI: Sharon Gibson is a 38 y.o. female  Chief Complaint  Patient presents with  . Injection Problem    Patient states that she got a flu shot 06/17/2019 at [facility] in [place]. Patient states below the injection site, there's a "welp". Patient states redness, pain, and itching in the area.    . This visit was completed via WebEx due to the restrictions of the COVID-19 pandemic. All issues as above were discussed and addressed. Physical exam was done as above through visual confirmation on WebEx. If it was felt that the patient should be evaluated in the office, they were directed there. The patient verbally consented to this visit. . Location of the patient: home . Location of the provider: home . Those involved with this call:  . Provider: Merrie Roof, PA-C . CMA: Merilyn Baba, [place] . Front Desk/Registration: Jill Side  . Time spent on call: 15 minutes with patient face to face via video conference. More than 50% of this time was spent in counseling and coordination of care. 5 minutes total spent in review of patient's record and preparation of their chart. I verified patient identity using two factors (patient name and date of birth). Patient consents verbally to being seen via telemedicine visit today.   Got flu shot 9/19 on right arm, now under that area about 2-3 inches has a red welt in that area. Has never had a reaction like this in the past, has not had a flu vaccine in years though. Has been taking benadryl at bedtime to sleep. Denies known bug bites or wounds from injury to area, fevers, chills, drainage, body aches, sweats.   Relevant past medical, surgical, family and social history reviewed and updated as indicated. Interim medical history since our last visit reviewed. Allergies and medications reviewed and updated.  Review  of Systems  Per HPI unless specifically indicated above     Objective:    There were no vitals taken for this visit.  Wt Readings from Last 3 Encounters:  03/20/19 275 lb 7.4 oz (125 kg)  12/30/18 240 lb (108.9 kg)  12/11/18 250 lb (113.4 kg)    Physical Exam Vitals signs and nursing note reviewed.  Constitutional:      General: She is not in acute distress.    Appearance: Normal appearance.  HENT:     Head: Atraumatic.     Right Ear: External ear normal.     Left Ear: External ear normal.     Nose: Nose normal. No congestion.     Mouth/Throat:     Mouth: Mucous membranes are moist.     Pharynx: Oropharynx is clear. No posterior oropharyngeal erythema.  Eyes:     Extraocular Movements: Extraocular movements intact.     Conjunctiva/sclera: Conjunctivae normal.  Neck:     Musculoskeletal: Normal range of motion.  Cardiovascular:     Comments: Unable to assess via virtual visit Pulmonary:     Effort: Pulmonary effort is normal. No respiratory distress.  Musculoskeletal: Normal range of motion.  Skin:    General: Skin is dry.     Findings: Erythema (0.5 cm erythematous maculopapular rash on right upper arm about 3 inches down from injection site (per patient's recollection)) present.     Comments: Area minimally ttp per patient's self exam and no noted  fluctuance or drainage  Neurological:     Mental Status: She is alert and oriented to person, place, and time.  Psychiatric:        Mood and Affect: Mood normal.        Thought Content: Thought content normal.        Judgment: Judgment normal.     Results for orders placed or performed in visit on 06/06/19  Novel Coronavirus, NAA ([facility])   Specimen: Oropharyngeal(OP) collection in vial transport medium   OROPHARYNGEA  TESTING  Result Value Ref Range   SARS-CoV-2, NAA Not Detected Not Detected      Assessment & Plan:   Problem List Items Addressed This Visit    None    Visit Diagnoses    Rash    -  Primary    Inflammatory skin reaction to injection vs insect bite - area is not in direct site of injection so assuming the later. Benadryl, triamcinolone cream, ice.        Follow up plan: Return if symptoms worsen or fail to improve.

## 2019-09-11 ENCOUNTER — Ambulatory Visit
Admission: EM | Admit: 2019-09-11 | Discharge: 2019-09-11 | Disposition: A | Discharge status: Home / Self Care | Payer: BC Managed Care – PPO | Attending: Family Medicine | Admitting: Family Medicine

## 2019-09-11 ENCOUNTER — Other Ambulatory Visit: Payer: Self-pay

## 2019-09-11 DIAGNOSIS — K047 Periapical abscess without sinus: Secondary | ICD-10-CM | POA: Diagnosis not present

## 2019-09-11 DIAGNOSIS — A084 Viral intestinal infection, unspecified: Secondary | ICD-10-CM

## 2019-09-11 MED ORDER — CLINDAMYCIN HCL 150 MG PO CAPS: 150.0000 mg | ORAL_CAPSULE | Freq: Three times a day (TID) | ORAL | 0 refills | Status: DC

## 2019-09-11 MED ORDER — ONDANSETRON 8 MG PO TBDP: 8.0000 mg | ORAL_TABLET | Freq: Three times a day (TID) | ORAL | 0 refills | Status: DC | PRN

## 2019-09-11 MED ORDER — ONDANSETRON 8 MG PO TBDP
8.0000 mg | ORAL_TABLET | Freq: Once | ORAL | Status: AC
  Administered 2019-09-11: 19:00:00 8 mg via ORAL

## 2019-09-11 NOTE — ED Triage Notes (Addendum)
Pt with dental pain right upper mouth. Has been going on for months but since Thursday she has been having tenderness to gums and thought she had an ear infection. This was followed by N/V/D Saturday night. No pain

## 2019-09-11 NOTE — Discharge Instructions (Signed)
Fluids, rest Await covid test result Follow up with dentist for tooth

## 2019-09-11 NOTE — ED Provider Notes (Signed)
[facility]    CSN: [id number] Arrival date & time: 09/11/19  1732      History   Chief Complaint Chief Complaint  Patient presents with  . Appointment  . Nausea  . Diarrhea  . Dental Pain    HPI Enrigue CatenaKristen Reitano is a 38 y.o. female.   38 yo female with a c/o dental pain in the right upper molars area that's been intermittent for months.   Also states since yesterday has had nausea, vomiting and watery diarrhea. Denies any fevers, chills, cough, shortness of breath, melena, hematochezia, hematemesis.      Past Medical History:  Diagnosis Date  . Anxiety   . Chicken pox   . Depression   . Eating disorder    Has had residential treatment and hospitalization previously.  [facility]. Heart murmur    as a child  . History of anemia    RECIEVES IRON INFUSIONS YEARLY  . History of pulmonary embolus (PE) 2002   bilaterally-[facility]-w/u negative per pt  . Hypothyroidism    no meds currently  . PTSD (post-traumatic stress disorder)     Patient Active Problem List   Diagnosis Date Noted  . Other fatigue 04/16/2019  . Exposure to COVID-19 virus 04/07/2019  . Endometriosis determined by laparoscopy 06/13/2018  . Ovarian cyst 03/01/2018  . Hx pulmonary embolism 02/03/2018  . Generalized anxiety disorder 10/12/2016  . Right knee pain 09/02/2016  . Severe recurrent major depression without psychotic features (HCC) 07/23/2016  . Bulimia nervosa, in partial remission, mild 07/23/2016    Class: Chronic  . Iron deficiency anemia 04/08/2016  . B12 deficiency 04/08/2016  . S/P gastric bypass 03/20/2016  . Eating disorder 03/20/2016  . Insomnia 03/20/2016  . PTSD (post-traumatic stress disorder) 03/20/2016  . History of hypothyroidism 03/20/2016  . PCOS (polycystic ovarian syndrome) 12/14/2014    Past Surgical History:  Procedure Laterality Date  . CHOLECYSTECTOMY  2003  . EXCISION OF ENDOMETRIOMA    . GASTRIC BYPASS  2003  . LAPAROSCOPIC  OVARIAN CYSTECTOMY Left 05/10/2018   Procedure: LAPAROSCOPIC OVARIAN CYSTECTOMY;  Surgeon: Natale MilchSchuman, Christanna R, MD;  Location: [facility] ORS;  Service: Gynecology;  Laterality: Left;    OB History    Gravida  0   Para  0   Term  0   Preterm  0   AB  0   Living  0     SAB  0   TAB  0   Ectopic  0   Multiple  0   Live Births  0            Home Medications    Prior to Admission medications   Medication Sig Start Date End Date Taking? Authorizing Provider  buPROPion (WELLBUTRIN SR) 150 MG 12 hr tablet 1 tab qAM x 3-4 days, then 1 tab BID after that 05/22/19   Olevia PerchesJohnson, Megan P, DO  clindamycin (CLEOCIN) 150 MG capsule Take 1 capsule (150 mg total) by mouth 3 (three) times daily. 09/11/19   Payton Mccallumonty, Tynasia Mccaul, MD  clonazePAM (KLONOPIN) 0.5 MG tablet Take 1 tablet (0.5 mg total) by mouth 2 (two) times daily as needed for anxiety. 05/22/19   Olevia PerchesJohnson, Megan P, DO  levonorgestrel (MIRENA) 20 MCG/24HR IUD 1 each by Intrauterine route once.    [provider]  levothyroxine (SYNTHROID) 75 MCG tablet Take 1 tablet (75 mcg total) by mouth daily. 03/28/19   Olevia PerchesJohnson, Megan P, DO  Multiple Vitamin (MULTIVITAMIN) tablet Take 1 tablet  by mouth daily.    [provider]  ondansetron (ZOFRAN ODT) 8 MG disintegrating tablet Take 1 tablet (8 mg total) by mouth every 8 (eight) hours as needed. 09/11/19   Payton Mccallum, MD  sertraline (ZOLOFT) 100 MG tablet Take 2 tablets (200 mg total) by mouth at bedtime. 05/22/19 08/20/19  Olevia Perches P, DO  triamcinolone cream (KENALOG) 0.1 % Apply 1 application topically 2 (two) times daily. 06/20/19   Particia Nearing, PA-C  Vitamin D, Ergocalciferol, (DRISDOL) 1.25 MG (50000 UT) CAPS capsule Take 1 capsule (50,000 Units total) by mouth every 7 (seven) days. 10/17/18   Dorcas Carrow, DO    Family History Family History  Problem Relation Age of Onset  . Diabetes Father   . Anxiety disorder Father   . Prostate cancer Paternal  Grandfather   . Alzheimer's disease Maternal Grandmother   . Alzheimer's disease Maternal Grandfather   . Lung cancer Paternal Grandmother     Social History Social History   Tobacco Use  . Smoking status: Never Smoker  . Smokeless tobacco: Never Used  Substance Use Topics  . Alcohol use: Yes    Alcohol/week: 0.0 - 1.0 standard drinks    Comment: rare  . Drug use: No     Allergies   Penicillins, Azithromycin, Other, and Strawberry (diagnostic)   Review of Systems Review of Systems   Physical Exam Triage Vital Signs ED Triage Vitals  Enc Vitals Group     BP 09/11/19 1751 (!) 147/77     Pulse Rate 09/11/19 1751 85     Resp 09/11/19 1751 19     Temp 09/11/19 1751 98.2 F (36.8 C)     Temp Source 09/11/19 1751 Oral     SpO2 09/11/19 1751 99 %     Weight 09/11/19 1750 282 lb (127.9 kg)     Height 09/11/19 1750 5\' 3"  (1.6 m)     Head Circumference --      Peak Flow --      Pain Score 09/11/19 1749 0     Pain Loc --      Pain Edu? --      Excl. in GC? --    No data found.  Updated Vital Signs BP (!) 147/77 (BP Location: Right Arm)   Pulse 85   Temp 98.2 F (36.8 C) (Oral)   Resp 19   Ht 5\' 3"  (1.6 m)   Wt 127.9 kg   SpO2 99%   BMI 49.95 kg/m   Visual Acuity Right Eye Distance:   Left Eye Distance:   Bilateral Distance:    Right Eye Near:   Left Eye Near:    Bilateral Near:     Physical Exam Vitals and nursing note reviewed.  Constitutional:      General: She is not in acute distress.    Appearance: She is not toxic-appearing or diaphoretic.  HENT:     Mouth/Throat:     Dentition: Abnormal dentition. Dental tenderness present.  Pulmonary:     Effort: Pulmonary effort is normal. No respiratory distress.  Abdominal:     General: Bowel sounds are normal. There is no distension.     Palpations: Abdomen is soft. There is no mass.     Tenderness: There is abdominal tenderness (mild; diffuse). There is no right CVA tenderness, left CVA tenderness,  guarding or rebound.     Hernia: No hernia is present.  Neurological:     Mental Status: She is alert.  UC Treatments / Results  Labs (all labs ordered are listed, but only abnormal results are displayed) Labs Reviewed  NOVEL CORONAVIRUS, NAA (HOSP ORDER, SEND-OUT TO REF LAB; TAT 18-24 HRS)    EKG   Radiology No results found.  Procedures Procedures (including critical care time)  Medications Ordered in UC Medications  ondansetron (ZOFRAN-ODT) disintegrating tablet 8 mg (8 mg Oral Given 09/11/19 1840)    Initial Impression / Assessment and Plan / UC Course  I have reviewed the triage vital signs and the nursing notes.  Pertinent labs & imaging results that were available during my care of the patient were reviewed by me and considered in my medical decision making (see chart for details).     Final Clinical Impressions(s) / UC Diagnoses   Final diagnoses:  Dental infection  Viral gastroenteritis     Discharge Instructions     Fluids, rest Await covid test result Follow up with dentist for tooth    ED Prescriptions    Medication Sig Dispense Auth. Provider   ondansetron (ZOFRAN ODT) 8 MG disintegrating tablet Take 1 tablet (8 mg total) by mouth every 8 (eight) hours as needed. 6 tablet Norval Gable, MD   clindamycin (CLEOCIN) 150 MG capsule Take 1 capsule (150 mg total) by mouth 3 (three) times daily. 30 capsule Norval Gable, MD      1. Lab results and diagnosis reviewed with patient 2. rx as per orders above; reviewed possible side effects, interactions, risks and benefits  3. Recommend supportive treatment as above 4. Follow-up prn if symptoms worsen or don't improve   PDMP not reviewed this encounter.   Norval Gable, MD 09/13/19 [phone]

## 2019-09-13 LAB — NOVEL CORONAVIRUS, NAA (HOSP ORDER, SEND-OUT TO REF LAB; TAT 18-24 HRS): SARS-CoV-2, NAA: NOT DETECTED

## 2019-09-25 IMAGING — US US ART/VEN ABD/PELV/SCROTUM DOPPLER LTD
1 series · 13 of 25 positions shown · non-contrast
Comparison: None.

CLINICAL DATA: Initial evaluation for acute pelvic pain, vaginal
discharge.

EXAM:
TRANSABDOMINAL AND TRANSVAGINAL ULTRASOUND OF PELVIS
DOPPLER ULTRASOUND OF OVARIES
TECHNIQUE: Both transabdominal and transvaginal ultrasound examinations of the
pelvis were performed. Transabdominal technique was performed for
global imaging of the pelvis including uterus, ovaries, adnexal
regions, and pelvic cul-de-sac.
It was necessary to proceed with endovaginal exam following the
transabdominal exam to visualize the uterus, endometrium, and
ovaries. Color and duplex Doppler ultrasound was utilized to
evaluate blood flow to the ovaries.

[Series 1: us art/ven abd/pelv/scrotum doppler ltd · 0.24mm/px · 105 acquisitions, 13 frames shown]
[im 1/105]
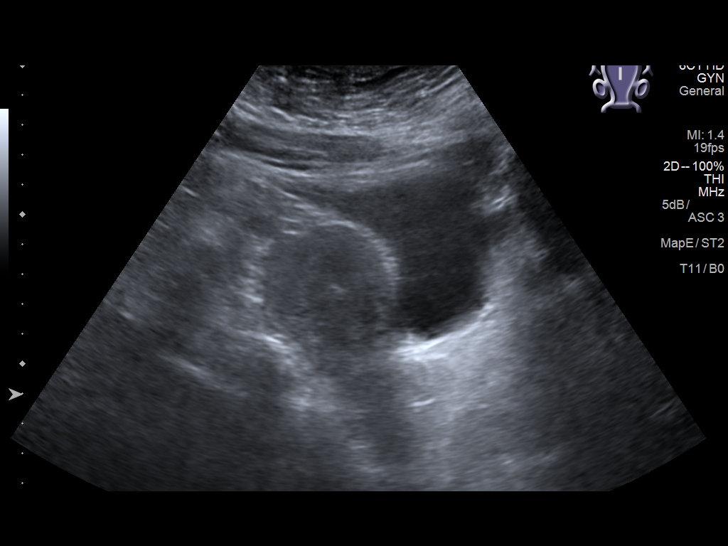
[im 9/105]
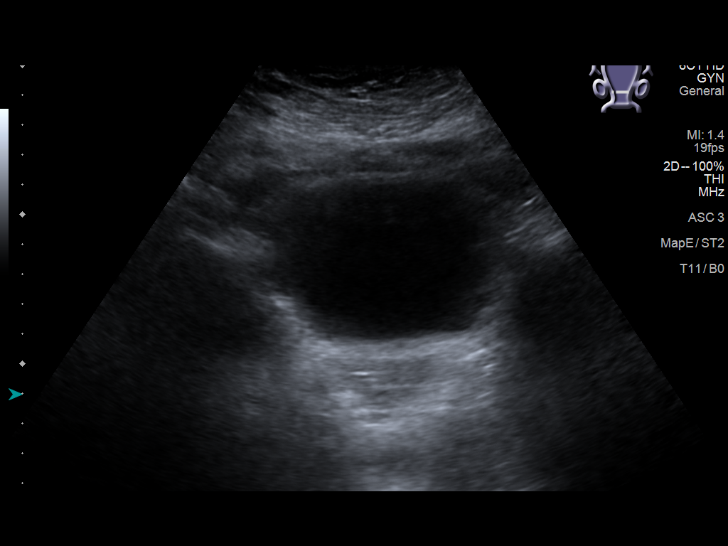
[im 18/105]
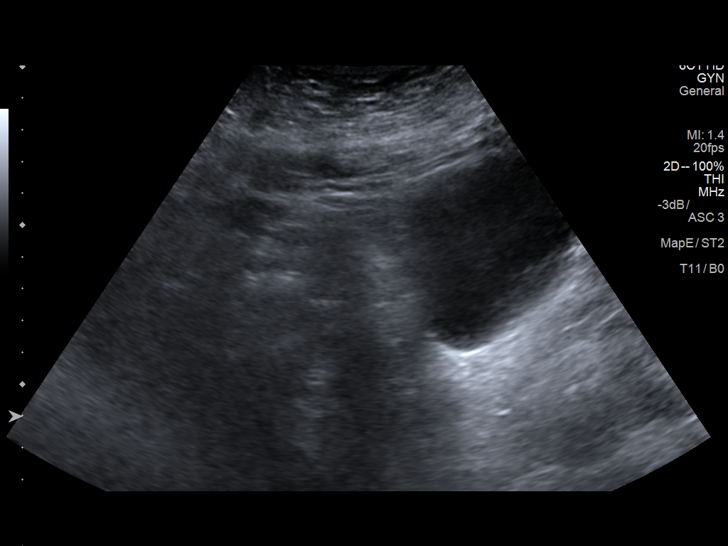
[im 27/105]
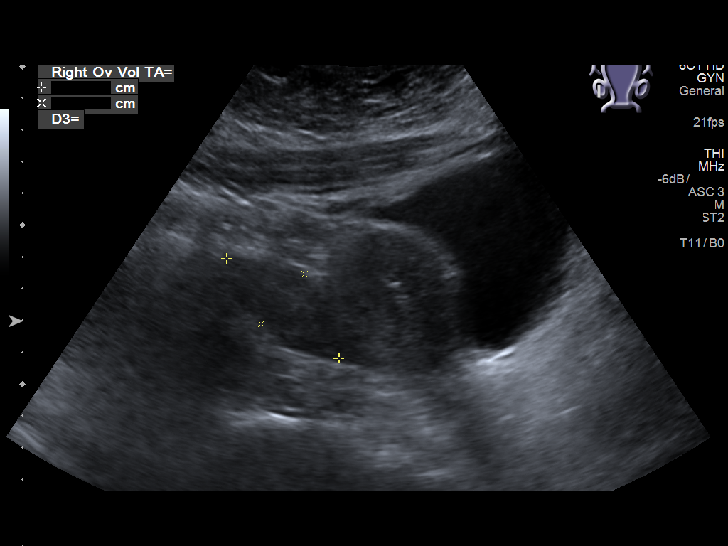
[im 35/105]
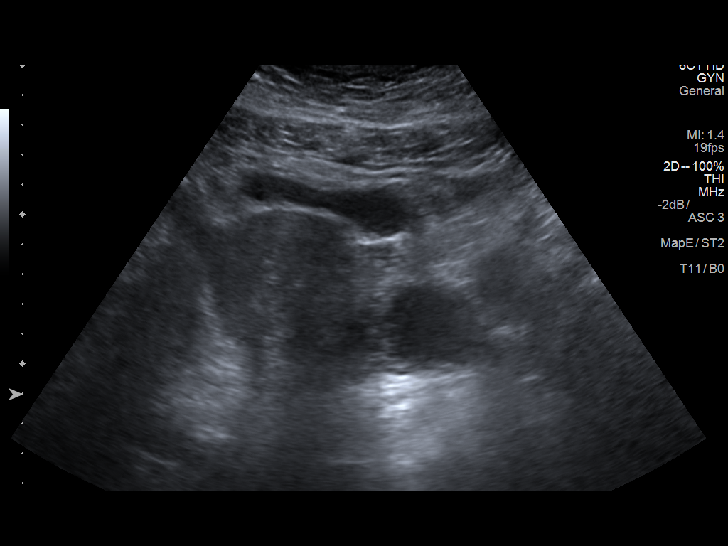
[im 44/105]
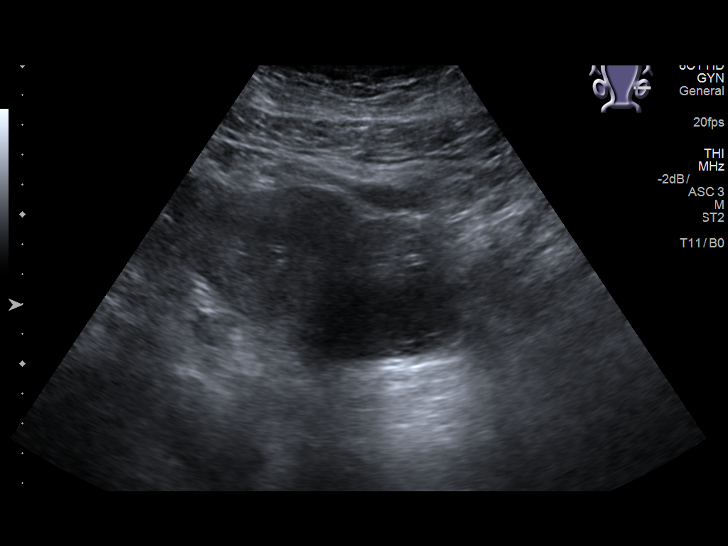
[im 53/105]
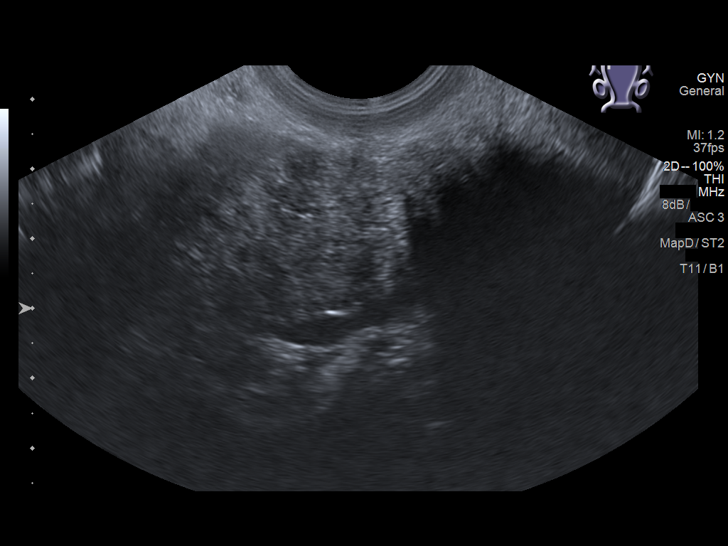
[im 61/105]
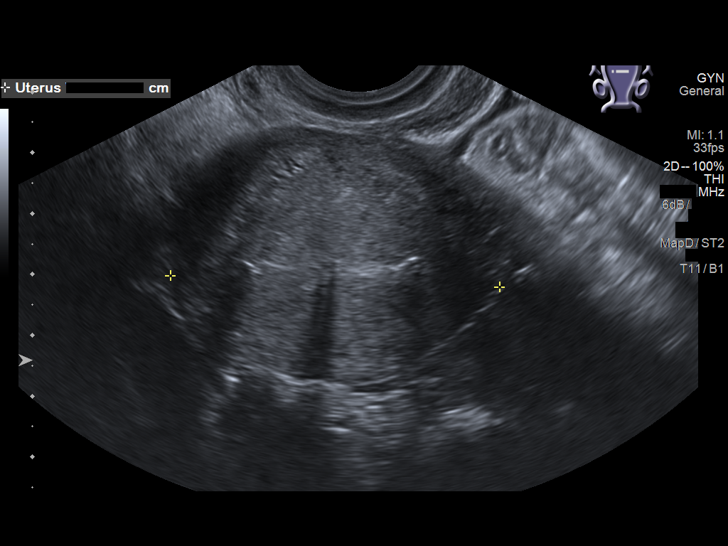
[im 70/105]
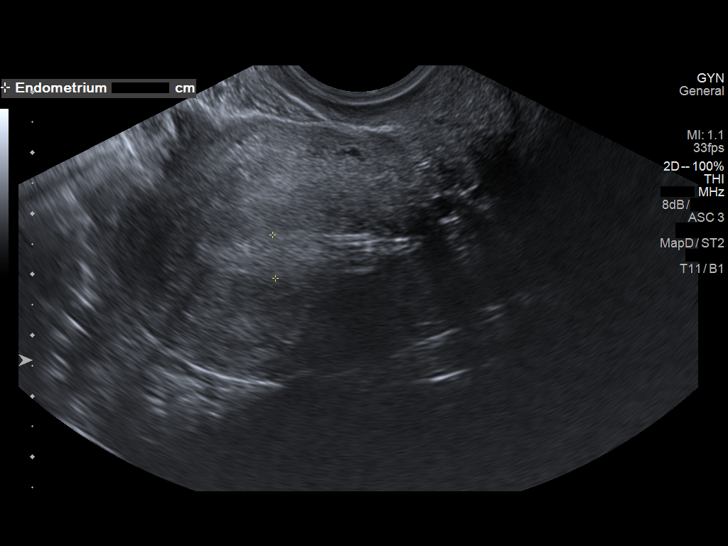
[im 79/105]
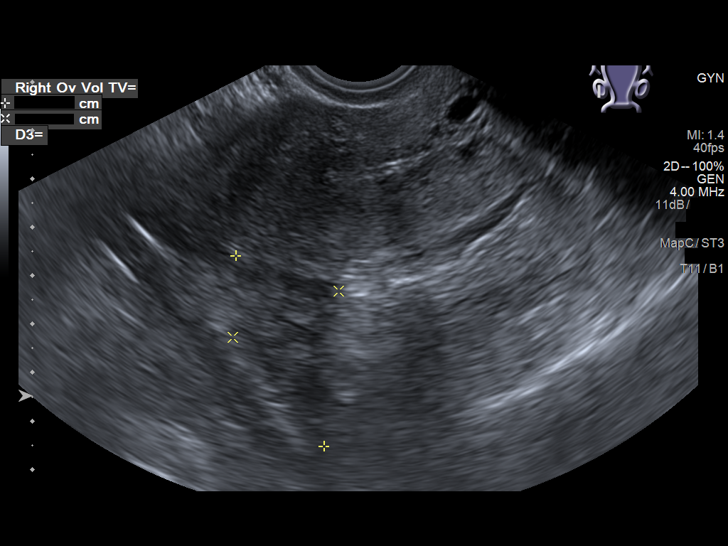
[im 87/105]
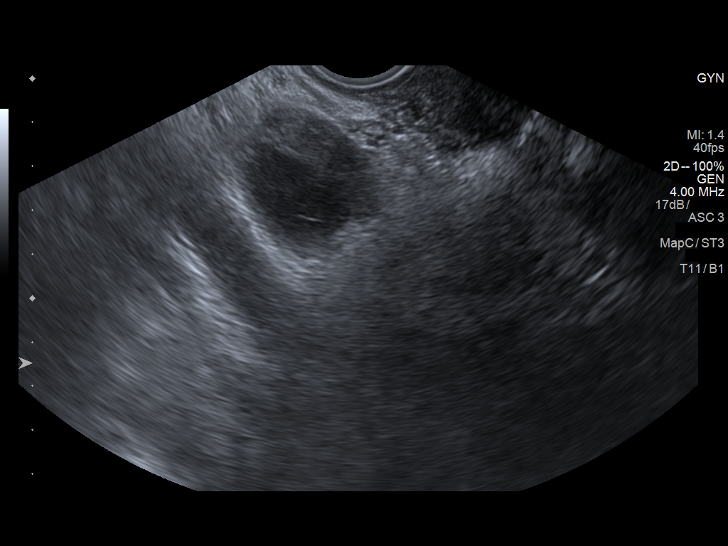
[im 96/105]
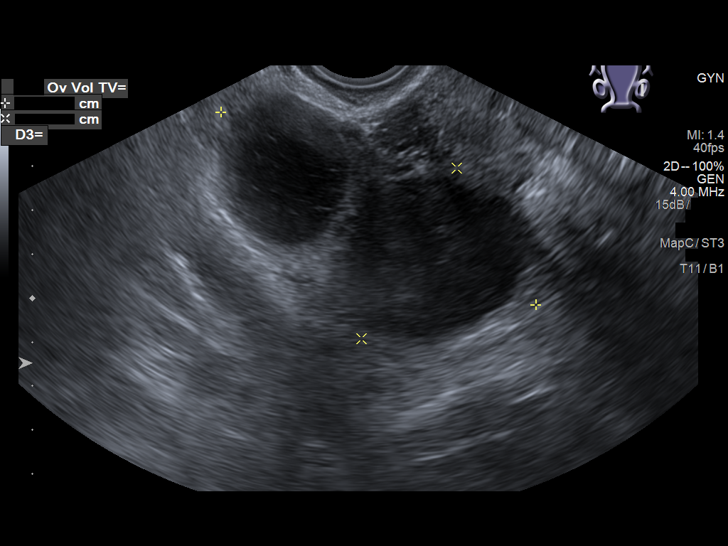
[im 105/105]
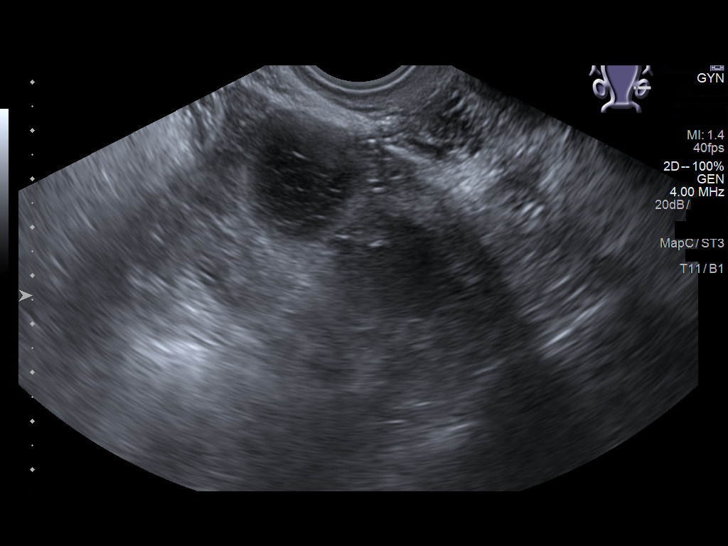

[13 of 25 positions shown; findings below may reference images not displayed]

FINDINGS: Uterus

Measurements: 7.6 x 4.4 x 5.4 cm. No fibroids or other mass
visualized.

Endometrium

Thickness: 7.1 mm. No focal abnormality visualized. IUD in
appropriate position within the endometrial cavity.

Right ovary

Measurements: 4.3 x 2.4 x 1.8 cm. Right ovary poorly visualized via
transvaginal technique due to posterior positioning, but grossly
normal in appearance without mass lesion or other abnormality.

Left ovary

Measurements: 8.4 x 4.4 x 5.3 cm. 3.7 x 3.5 x 3.5 cm complex cyst
with internal lace-like architecture, most consistent with a
hemorrhagic cyst. Second hypoechoic lesion measuring 5.2 x 3.9 x
cm with fairly homogeneous low-level internal echoes. Finding could
reflect a second hemorrhagic cyst or possibly endometrioma. Question
of possible mural nodularity (image 113).

Pulsed Doppler evaluation of the left ovary demonstrates normal
low-resistance arterial and venous waveforms. Doppler waveforms of
the right ovary was unable to be obtained due to ovarian position.
Gross color-flow is thought to be maintained within the ovary.

Other findings

No abnormal free fluid.
IMPRESSION: 1. 5.2 cm complex hypoechoic left ovarian lesion, indeterminate.
Primary differential considerations include a possible hemorrhagic
cyst, enlarged physiologic cyst with internal proteinaceous debris,
or possibly endometrioma. Question of focal mural nodularity within
this lesion, not entirely certain. A short interval follow-up
ultrasound in 6-12 weeks is recommended for further evaluation.
2. Second 3.7 cm complex left ovarian cyst, most consistent with a
benign hemorrhagic cyst.
3. No other acute abnormality within the pelvis. No sonographic
evidence for torsion within the left ovary. Doppler evaluation of
the right ovary limited due to positioning, however, the right ovary
is grossly normal in appearance.
4. IUD in appropriate position within the endometrial canal.

## 2019-10-20 ENCOUNTER — Other Ambulatory Visit: Payer: Self-pay | Admitting: Hematology and Oncology

## 2019-10-20 DIAGNOSIS — E538 Deficiency of other specified B group vitamins: Secondary | ICD-10-CM

## 2019-10-23 ENCOUNTER — Other Ambulatory Visit: Payer: Self-pay | Admitting: Hematology and Oncology

## 2019-10-23 DIAGNOSIS — R7989 Other specified abnormal findings of blood chemistry: Secondary | ICD-10-CM

## 2019-10-23 NOTE — Progress Notes (Signed)
Confirmed Name and DOB. Denies any concerns. Requesting Thyroid Check. Reports she is having dizziness and weakness starting approx. 2 weeks ago. Denies noticing it with position changes.

## 2019-10-24 ENCOUNTER — Inpatient Hospital Stay: Payer: BC Managed Care – PPO | Attending: Hematology and Oncology

## 2019-10-24 ENCOUNTER — Other Ambulatory Visit: Payer: Self-pay

## 2019-10-24 DIAGNOSIS — Z79899 Other long term (current) drug therapy: Secondary | ICD-10-CM | POA: Diagnosis not present

## 2019-10-24 DIAGNOSIS — Z9884 Bariatric surgery status: Secondary | ICD-10-CM | POA: Insufficient documentation

## 2019-10-24 DIAGNOSIS — D509 Iron deficiency anemia, unspecified: Secondary | ICD-10-CM | POA: Diagnosis not present

## 2019-10-24 DIAGNOSIS — R7989 Other specified abnormal findings of blood chemistry: Secondary | ICD-10-CM

## 2019-10-24 DIAGNOSIS — D508 Other iron deficiency anemias: Secondary | ICD-10-CM

## 2019-10-24 DIAGNOSIS — E538 Deficiency of other specified B group vitamins: Secondary | ICD-10-CM

## 2019-10-24 DIAGNOSIS — K9589 Other complications of other bariatric procedure: Secondary | ICD-10-CM

## 2019-10-24 LAB — CBC WITH DIFFERENTIAL/PLATELET
Abs Immature Granulocytes: 0.04 10*3/uL (ref 0.00–0.07)
Basophils Absolute: 0.1 10*3/uL (ref 0.0–0.1)
Basophils Relative: 1 %
Eosinophils Absolute: 0.4 10*3/uL (ref 0.0–0.5)
Eosinophils Relative: 4 %
HCT: 42 % (ref 36.0–46.0)
Hemoglobin: 13.9 g/dL (ref 12.0–15.0)
Immature Granulocytes: 0 %
Lymphocytes Relative: 22 %
Lymphs Abs: 2 10*3/uL (ref 0.7–4.0)
MCH: 29.3 pg (ref 26.0–34.0)
MCHC: 33.1 g/dL (ref 30.0–36.0)
MCV: 88.6 fL (ref 80.0–100.0)
Monocytes Absolute: 0.6 10*3/uL (ref 0.1–1.0)
Monocytes Relative: 6 %
Neutro Abs: 5.9 10*3/uL (ref 1.7–7.7)
Neutrophils Relative %: 67 %
Platelets: 265 10*3/uL (ref 150–400)
RBC: 4.74 MIL/uL (ref 3.87–5.11)
RDW: 13.3 % (ref 11.5–15.5)
WBC: 9 10*3/uL (ref 4.0–10.5)
nRBC: 0 % (ref 0.0–0.2)

## 2019-10-24 LAB — IRON AND TIBC
Iron: 100 ug/dL (ref 28–170)
Saturation Ratios: 29 % (ref 10.4–31.8)
TIBC: 342 ug/dL (ref 250–450)
UIBC: 242 ug/dL

## 2019-10-24 LAB — TSH: TSH: 6.731 u[IU]/mL — ABNORMAL HIGH (ref 0.350–4.500)

## 2019-10-24 LAB — FERRITIN: Ferritin: 16 ng/mL (ref 11–307)

## 2019-10-24 LAB — FOLATE: Folate: 14.1 ng/mL (ref >5.9)

## 2019-10-24 LAB — T4, FREE: Free T4: 0.78 ng/dL (ref 0.61–1.12)

## 2019-10-24 NOTE — Progress Notes (Signed)
[facility]  [address] [place], [place] [address] Phone: [phone]  Fax: [phone]   Clinic Day:  10/25/2019  Referring physician: Dorcas Carrow, DO  Chief Complaint: Sharon Gibson is a 39 y.o. female with iron and B12 deficiency who is seen for 7 month assessment.   HPI: The patient was last seen in the hematology clinic on 03/20/2019. At that time, she noted fatigue.  Exam was normal.  Ferritin was 24.  She was seen in [facility] ER on 09/11/2019 for dental infection of right upper molars. She was given a prescription for clindamycin and ondansetron.  She didn't pick up her antibiotics.  Extraction was planned but cancelled because of the pandemic.  Labs on 10/24/2019 revealed a hematocrit 42.0, hemoglobin 13.9, MCV 88.6, platelets 265,000, WBC 9000, ANC 5900. Folate 14.1.  Ferritin was 16 with iron saturation of 29% and a TIBC 342.  TSH was 6.731 (0.350 - 4.500), and T4 0.78 (normal).  During the interim, sh has felt "ok".  She notes dizziness and weakness for the past 2 weeks.  Her energy level has been super low.  She has been working out and notices her low energy level when she does cardio. She describes it as trying to move through quick sand. The ability to get through her workout is going down. She has notices a longer recovery period from workout.  She denies extreme ice pica. She has ice pica occasionally. She has chronic restless legs. She notes ankle edema and is alleviated when she doesn't eat grain.  She feels her diet is a lot better.  She has 2 protein shakes every day and eating more vegetables. She won't eat many green leafy vegetables.   She receives her B-12 injection here; her last injection was in 11/2018.  She hasn't had a period yet but has PMS symptoms: she is not pregnant.   Past Medical History:  Diagnosis Date  . Anxiety   . Chicken pox   . Depression   . Eating disorder    Has had residential treatment and  hospitalization previously.  Marland Kitchen Heart murmur    as a child  . History of anemia    RECIEVES IRON INFUSIONS YEARLY  . History of pulmonary embolus (PE) 2002   bilaterally-followed by [facility] hematology-w/u negative per pt  . Hypothyroidism    no meds currently  . PTSD (post-traumatic stress disorder)     Past Surgical History:  Procedure Laterality Date  . CHOLECYSTECTOMY  2003  . EXCISION OF ENDOMETRIOMA    . GASTRIC BYPASS  2003  . LAPAROSCOPIC OVARIAN CYSTECTOMY Left 05/10/2018   Procedure: LAPAROSCOPIC OVARIAN CYSTECTOMY;  Surgeon: Natale Milch, MD;  Location: [facility] ORS;  Service: Gynecology;  Laterality: Left;    Family History  Problem Relation Age of Onset  . Diabetes Father   . Anxiety disorder Father   . Prostate cancer Paternal Grandfather   . Alzheimer's disease Maternal Grandmother   . Alzheimer's disease Maternal Grandfather   . Lung cancer Paternal Grandmother     Social History:  reports that she has never smoked. She has never used smokeless tobacco. She reports current alcohol use. She reports that she does not use drugs. She has 1 alcohol beverage per week. She is single, but sexually active and is currently on IUD Mirena for birth control.  The patient is alone today.  Allergies:  Allergies  Allergen Reactions  . Penicillins Hives    Has patient had a  PCN reaction causing immediate rash, facial/tongue/throat swelling, SOB or lightheadedness with hypotension: yes Has patient had a PCN reaction causing severe rash involving mucus membranes or skin necrosis: no Has patient had a PCN reaction that required hospitalization: yes Has patient had a PCN reaction occurring within the last 10 years: no If all of the above answers are "NO", then may proceed with Cephalosporin use.   . Azithromycin   . Other     Narcotics- Burning in the stomach (whether PO or IV)  . Strawberry (Diagnostic) Swelling    AND WALNUTS-TONGUE SWELLING    Current  Medications: Current Outpatient Medications  Medication Sig Dispense Refill  . buPROPion (WELLBUTRIN SR) 150 MG 12 hr tablet 1 tab qAM x 3-4 days, then 1 tab BID after that 60 tablet 3  . clonazePAM (KLONOPIN) 0.5 MG tablet Take 1 tablet (0.5 mg total) by mouth 2 (two) times daily as needed for anxiety. 60 tablet 1  . levonorgestrel (MIRENA) 20 MCG/24HR IUD 1 each by Intrauterine route once.    . Multiple Vitamin (MULTIVITAMIN) tablet Take 1 tablet by mouth daily.    . sertraline (ZOLOFT) 100 MG tablet Take 2 tablets (200 mg total) by mouth at bedtime. 180 tablet 1  . levothyroxine (SYNTHROID) 75 MCG tablet Take 1 tablet (75 mcg total) by mouth daily. (Patient not taking: Reported on 10/23/2019) 30 tablet 2  . Vitamin D, Ergocalciferol, (DRISDOL) 1.25 MG (50000 UT) CAPS capsule Take 1 capsule (50,000 Units total) by mouth every 7 (seven) days. (Patient not taking: Reported on 10/23/2019) 12 capsule 3   No current facility-administered medications for this visit.    Review of Systems  Constitutional: Positive for malaise/fatigue. Negative for chills and fever.       Feels "exhausted".  No energy.  HENT: Negative.  Negative for congestion, ear discharge, ear pain, hearing loss, nosebleeds, sinus pain and sore throat.   Eyes: Negative.  Negative for blurred vision, double vision, photophobia and pain.  Respiratory: Negative.  Negative for cough, sputum production and shortness of breath.   Cardiovascular: Negative.  Negative for chest pain, palpitations and leg swelling.  Gastrointestinal: Negative.  Negative for abdominal pain, blood in stool, constipation, diarrhea, melena, nausea and vomiting.       Eating better.  Ice pica (occasional).  Genitourinary: Negative.  Negative for dysuria, frequency, hematuria and urgency.  Musculoskeletal: Negative.  Negative for back pain, joint pain, myalgias and neck pain.  Skin: Negative.  Negative for itching and rash.  Neurological: Positive for dizziness  and sensory change (restless legs). Negative for tingling, speech change, focal weakness, weakness and headaches.  Endo/Heme/Allergies: Negative.  Negative for environmental allergies. Does not bruise/bleed easily.  Psychiatric/Behavioral: Negative.  Negative for depression and memory loss. The patient is not nervous/anxious and does not have insomnia.    Performance status (ECOG): 1  Vitals Blood pressure (!) 107/59, pulse 72, temperature (!) 97 F (36.1 C), temperature source Tympanic, resp. rate 16, weight 279 lb 1.6 oz (126.6 kg), SpO2 100 %.   Physical Exam  Constitutional: She is oriented to person, place, and time. She appears well-developed and well-nourished. No distress.  HENT:  Head: Normocephalic and atraumatic.  Mouth/Throat: No oropharyngeal exudate.  Long auburn hair.  Mask.  Eyes: Pupils are equal, round, and reactive to light. Conjunctivae and EOM are normal. No scleral icterus.  Glasses.  Blue eyes.  Neck: No JVD present.  Cardiovascular: Normal rate, regular rhythm and normal heart sounds.  No murmur heard. Pulmonary/Chest:  Effort normal and breath sounds normal. No respiratory distress. She has no wheezes.  Abdominal: Soft. Bowel sounds are normal. She exhibits no distension and no mass. There is no abdominal tenderness. There is no rebound and no guarding.  Musculoskeletal:        General: Edema (ankle) present. No tenderness.     Cervical back: Normal range of motion and neck supple.  Lymphadenopathy:       Head (right side): No preauricular, no posterior auricular and no occipital adenopathy present.       Head (left side): No preauricular, no posterior auricular and no occipital adenopathy present.    She has no cervical adenopathy.    She has no axillary adenopathy.       Right: No inguinal and no supraclavicular adenopathy present.       Left: No inguinal and no supraclavicular adenopathy present.  Neurological: She is alert and oriented to person, place,  and time.  Skin: Skin is warm and dry. No rash noted. She is not diaphoretic. No erythema. No pallor.  Nail ridging.  Psychiatric: She has a normal mood and affect. Her behavior is normal. Judgment and thought content normal.  Nursing note and vitals reviewed.   Appointment on 10/24/2019  Component Date Value Ref Range Status  . Free T4 10/24/2019 0.78  0.61 - 1.12 ng/dL Final   Comment: (NOTE) Biotin ingestion may interfere with free T4 tests. If the results are inconsistent with the TSH level, previous test results, or the clinical presentation, then consider biotin interference. If needed, order repeat testing after stopping biotin. Performed at [facility], [address], [place], [place] [address]   . TSH 10/24/2019 6.731* 0.350 - 4.500 uIU/mL Final   Comment: Performed by a 3rd Generation assay with a functional sensitivity of <=0.01 uIU/mL. Performed at [facility], [address], [place], [place] [address]   . Folate 10/24/2019 14.1  >5.9 ng/mL Final   Performed at [facility], [address] [place], [place] [address]  . Iron 10/24/2019 100  28 - 170 ug/dL Final  . TIBC 10/24/2019 342  250 - 450 ug/dL Final  . Saturation Ratios 10/24/2019 29  10.4 - 31.8 % Final  . UIBC 10/24/2019 242  ug/dL Final   Performed at [facility], [address] [place], [place] [address]  . Ferritin 10/24/2019 16  11 - 307 ng/mL Final   Performed at [facility], [address] [place], [place] [address]  . WBC 10/24/2019 9.0  4.0 - 10.5 K/uL Final  . RBC 10/24/2019 4.74  3.87 - 5.11 MIL/uL Final  . Hemoglobin 10/24/2019 13.9  12.0 - 15.0 g/dL Final  . HCT 10/24/2019 42.0  36.0 - 46.0 % Final  . MCV 10/24/2019 88.6  80.0 - 100.0 fL Final  . MCH 10/24/2019 29.3  26.0 - 34.0 pg Final  . MCHC 10/24/2019 33.1  30.0 - 36.0 g/dL Final  . RDW 10/24/2019 13.3  11.5 - 15.5 % Final  . Platelets 10/24/2019 265  150 - 400 K/uL Final  . nRBC  10/24/2019 0.0  0.0 - 0.2 % Final  . Neutrophils Relative % 10/24/2019 67  % Final  . Neutro Abs 10/24/2019 5.9  1.7 - 7.7 K/uL Final  . Lymphocytes Relative 10/24/2019 22  % Final  . Lymphs Abs 10/24/2019 2.0  0.7 - 4.0 K/uL Final  . Monocytes Relative 10/24/2019 6  % Final  . Monocytes Absolute 10/24/2019 0.6  0.1 -  1.0 K/uL Final  . Eosinophils Relative 10/24/2019 4  % Final  . Eosinophils Absolute 10/24/2019 0.4  0.0 - 0.5 K/uL Final  . Basophils Relative 10/24/2019 1  % Final  . Basophils Absolute 10/24/2019 0.1  0.0 - 0.1 K/uL Final  . Immature Granulocytes 10/24/2019 0  % Final  . Abs Immature Granulocytes 10/24/2019 0.04  0.00 - 0.07 K/uL Final   Performed at [facility], [address] [place], [place] [address]    Assessment:  Shailee Foots is a 39 y.o. female with B12 and iron deficiency anemia s/p gastric bypass surgery(2004). She has received IV iron(different preparations) x 5 ([place], [place]; [place], [place]; [place], [place]) with reactionsrequiring Benadryl and steroids.  Labs on 03/20/2016 revealed a normal hematocrit but iron deficiency (ferritin 8). B12 was 185 (low). She began B12 on 04/07/2016.  She received Venofer200 mg IV with premedications (Tylenol, Benadryl, and Decadron) on 05/13/2016. She had side effectsof dizziness, lightheadedness, low blood pressure, excess sweating, aches, and pains, nausea, and headache after her infusion.  She has received Venofer: 12/31/2016 and weekly x 3 (10/27/2018 - 11/10/2018).  Ferritinhas been followed: 7.9 on06/23/2017, 8 on07/08/2016, 22 on 07/08/2016, 18 on01/06/2017, 10 on03/21/2018,13 on01/17/2020, 53 on 12/07/2018, 26 on 02/21/2019, 24 on 03/16/2019, and 16 on 10/24/2019.  She has B12 deficiency.B12 was 323 on 10/14/2018.  She receives B12 monthly (last 12/07/2018).  She has a history ofmultiple pulmonary emboliin 2003. Per patient report, hypercoagulable work-upwas negative. She  was on Coumadinx 6 months.  She has a history ofeasy bruising and heavy menses. She has anIUD.  Symptomatically, she is fatigued.  Exam is stable.  Plan: 1.   Review labs from 10/24/2019. 2.Urine pregnancy test. 3.   Iron deficiency Hematocrit 42.0. Hemoglobin 13.9.MCV 88.6. Ferritin 16. Iron saturation 29% with a TIBC 342             Ferritin goal 100.  IV iron if ferritin < 30.  She is fatigued and iron deficient.             Discuss consideration of IV iron given prior reaction.                         She has had reactions to many medications.                         She wishes to proceed with Feraheme today with premedications.                          Tylenol 650 mg po x 1.    Benadryl 50 mg po x 1.    Decadron 10 mg IV. 4. B12 deficiency B12 was 323 on 10/14/2018. Check B12 level today.  Patient last received B12 on 12/07/2018.  B12 today and monthly x 6.  RN to teach patient SQ technique for B12 administration at home. 5.RTC in 3 months for labs (CBC with diff, ferritin). 6.   RTC in 6 months for MD assessment, labs (CBC with diff, ferritin, iron studies- day before) and +/- Feraheme.  Addendum:  B12 level was 231.  Patient will receive an additional B12 injection in 1 week prior to monthly B12 at home.  I discussed the assessment and treatment plan with the patient.  The patient was provided an opportunity to ask questions and all were answered.  The patient agreed with the plan and demonstrated  an understanding of the instructions.  The patient was advised to call back if the symptoms worsen or if the condition fails to improve as anticipated.  I provided 20 minutes (9:08 AM - 9:28 AM) of face-to-face time during this this encounter and > 50% was spent counseling as documented under my assessment and plan.    Rosey Bath, MD, PhD    10/25/2019, 9:28 AM  I, Arnette Norris, am acting as a scribe for  Rosey Bath, MD.  I, Hena Ewalt C. Merlene Pulling, MD, have reviewed the above documentation for accuracy and completeness, and I agree with the above.

## 2019-10-25 ENCOUNTER — Inpatient Hospital Stay: Payer: BC Managed Care – PPO

## 2019-10-25 ENCOUNTER — Inpatient Hospital Stay (HOSPITAL_BASED_OUTPATIENT_CLINIC_OR_DEPARTMENT_OTHER): Payer: BC Managed Care – PPO | Admitting: Hematology and Oncology

## 2019-10-25 ENCOUNTER — Telehealth: Payer: Self-pay

## 2019-10-25 ENCOUNTER — Encounter: Payer: Self-pay | Admitting: Hematology and Oncology

## 2019-10-25 VITALS — BP 107/59 | HR 72 | Temp 97.0°F | Resp 16 | Wt 279.1 lb

## 2019-10-25 VITALS — BP 101/64 | HR 58 | Resp 18

## 2019-10-25 DIAGNOSIS — D509 Iron deficiency anemia, unspecified: Secondary | ICD-10-CM

## 2019-10-25 DIAGNOSIS — Z9884 Bariatric surgery status: Secondary | ICD-10-CM | POA: Diagnosis not present

## 2019-10-25 DIAGNOSIS — E538 Deficiency of other specified B group vitamins: Secondary | ICD-10-CM

## 2019-10-25 DIAGNOSIS — Z79899 Other long term (current) drug therapy: Secondary | ICD-10-CM | POA: Diagnosis not present

## 2019-10-25 LAB — PREGNANCY, URINE: Preg Test, Ur: NEGATIVE

## 2019-10-25 LAB — VITAMIN B12: Vitamin B-12: 231 pg/mL (ref 180–914)

## 2019-10-25 MED ORDER — SODIUM CHLORIDE 0.9 % IV SOLN
Freq: Once | INTRAVENOUS | Status: AC
  Filled 2019-10-25: qty 250

## 2019-10-25 MED ORDER — SODIUM CHLORIDE 0.9 % IV SOLN
510.0000 mg | Freq: Once | INTRAVENOUS | Status: AC
  Administered 2019-10-25: 510 mg via INTRAVENOUS
  Filled 2019-10-25: qty 17

## 2019-10-25 MED ORDER — CYANOCOBALAMIN 1000 MCG/ML IJ SOLN
1000.0000 ug | Freq: Once | INTRAMUSCULAR | Status: AC
  Administered 2019-10-25: 1000 ug via INTRAMUSCULAR
  Filled 2019-10-25: qty 1

## 2019-10-25 MED ORDER — FERUMOXYTOL INJECTION 510 MG/17 ML
INTRAVENOUS | Status: AC
  Filled 2019-10-25: qty 17

## 2019-10-25 MED ORDER — SODIUM CHLORIDE 0.9 % IV SOLN
10.0000 mg | Freq: Once | INTRAVENOUS | Status: AC
  Administered 2019-10-25: 10 mg via INTRAVENOUS
  Filled 2019-10-25: qty 1

## 2019-10-25 MED ORDER — DIPHENHYDRAMINE HCL 25 MG PO TABS
50.0000 mg | ORAL_TABLET | Freq: Once | ORAL | Status: AC
  Administered 2019-10-25: 50 mg via ORAL
  Filled 2019-10-25: qty 2

## 2019-10-25 MED ORDER — ACETAMINOPHEN 325 MG PO TABS
650.0000 mg | ORAL_TABLET | Freq: Once | ORAL | Status: AC
  Administered 2019-10-25: 650 mg via ORAL
  Filled 2019-10-25: qty 2

## 2019-10-25 NOTE — Telephone Encounter (Signed)
The patient labs has been routed over to her PCP office today.

## 2019-10-25 NOTE — Patient Instructions (Addendum)
Ferumoxytol injection What is this medicine? FERUMOXYTOL is an iron complex. Iron is used to make healthy red blood cells, which carry oxygen and nutrients throughout the body. This medicine is used to treat iron deficiency anemia. This medicine may be used for other purposes; ask your health care provider or pharmacist if you have questions. COMMON BRAND NAME(S): Feraheme What should I tell my health care provider before I take this medicine? They need to know if you have any of these conditions:  anemia not caused by low iron levels  high levels of iron in the blood  magnetic resonance imaging (MRI) test scheduled  an unusual or allergic reaction to iron, other medicines, foods, dyes, or preservatives  pregnant or trying to get pregnant  breast-feeding How should I use this medicine? This medicine is for injection into a vein. It is given by a health care professional in a hospital or clinic setting. Talk to your pediatrician regarding the use of this medicine in children. Special care may be needed. Overdosage: If you think you have taken too much of this medicine contact a poison control center or emergency room at once. NOTE: This medicine is only for you. Do not share this medicine with others. What if I miss a dose? It is important not to miss your dose. Call your doctor or health care professional if you are unable to keep an appointment. What may interact with this medicine? This medicine may interact with the following medications:  other iron products This list may not describe all possible interactions. Give your health care provider a list of all the medicines, herbs, non-prescription drugs, or dietary supplements you use. Also tell them if you smoke, drink alcohol, or use illegal drugs. Some items may interact with your medicine. What should I watch for while using this medicine? Visit your doctor or healthcare professional regularly. Tell your doctor or healthcare  professional if your symptoms do not start to get better or if they get worse. You may need blood work done while you are taking this medicine. You may need to follow a special diet. Talk to your doctor. Foods that contain iron include: whole grains/cereals, dried fruits, beans, or peas, leafy green vegetables, and organ meats (liver, kidney). What side effects may I notice from receiving this medicine? Side effects that you should report to your doctor or health care professional as soon as possible:  allergic reactions like skin rash, itching or hives, swelling of the face, lips, or tongue  breathing problems  changes in blood pressure  feeling faint or lightheaded, falls  fever or chills  flushing, sweating, or hot feelings  swelling of the ankles or feet Side effects that usually do not require medical attention (report to your doctor or health care professional if they continue or are bothersome):  diarrhea  headache  nausea, vomiting  stomach pain This list may not describe all possible side effects. Call your doctor for medical advice about side effects. You may report side effects to FDA at 1-800-FDA-1088. Where should I keep my medicine? This drug is given in a hospital or clinic and will not be stored at home. NOTE: This sheet is a summary. It may not cover all possible information. If you have questions about this medicine, talk to your doctor, pharmacist, or health care provider.  2020 Elsevier/Gold Standard (2016-11-02 20:21:10)  Cyanocobalamin, Vitamin B12 injection What is this medicine? CYANOCOBALAMIN (sye an oh koe BAL a min) is a man made form of vitamin   B12. Vitamin B12 is used in the growth of healthy blood cells, nerve cells, and proteins in the body. It also helps with the metabolism of fats and carbohydrates. This medicine is used to treat people who can not absorb vitamin B12. This medicine may be used for other purposes; ask your health care provider or  pharmacist if you have questions. COMMON BRAND NAME(S): B-12 Compliance Kit, B-12 Injection Kit, Cyomin, LA-12, Nutri-Twelve, Physicians EZ Use B-12, Primabalt What should I tell my health care provider before I take this medicine? They need to know if you have any of these conditions:  kidney disease  Leber's disease  megaloblastic anemia  an unusual or allergic reaction to cyanocobalamin, cobalt, other medicines, foods, dyes, or preservatives  pregnant or trying to get pregnant  breast-feeding How should I use this medicine? This medicine is injected into a muscle or deeply under the skin. It is usually given by a health care professional in a clinic or doctor's office. However, your doctor may teach you how to inject yourself. Follow all instructions. Talk to your pediatrician regarding the use of this medicine in children. Special care may be needed. Overdosage: If you think you have taken too much of this medicine contact a poison control center or emergency room at once. NOTE: This medicine is only for you. Do not share this medicine with others. What if I miss a dose? If you are given your dose at a clinic or doctor's office, call to reschedule your appointment. If you give your own injections and you miss a dose, take it as soon as you can. If it is almost time for your next dose, take only that dose. Do not take double or extra doses. What may interact with this medicine?  colchicine  heavy alcohol intake This list may not describe all possible interactions. Give your health care provider a list of all the medicines, herbs, non-prescription drugs, or dietary supplements you use. Also tell them if you smoke, drink alcohol, or use illegal drugs. Some items may interact with your medicine. What should I watch for while using this medicine? Visit your doctor or health care professional regularly. You may need blood work done while you are taking this medicine. You may need to  follow a special diet. Talk to your doctor. Limit your alcohol intake and avoid smoking to get the best benefit. What side effects may I notice from receiving this medicine? Side effects that you should report to your doctor or health care professional as soon as possible:  allergic reactions like skin rash, itching or hives, swelling of the face, lips, or tongue  blue tint to skin  chest tightness, pain  difficulty breathing, wheezing  dizziness  red, swollen painful area on the leg Side effects that usually do not require medical attention (report to your doctor or health care professional if they continue or are bothersome):  diarrhea  headache This list may not describe all possible side effects. Call your doctor for medical advice about side effects. You may report side effects to FDA at 1-800-FDA-1088. Where should I keep my medicine? Keep out of the reach of children. Store at room temperature between 15 and 30 degrees C (59 and 85 degrees F). Protect from light. Throw away any unused medicine after the expiration date. NOTE: This sheet is a summary. It may not cover all possible information. If you have questions about this medicine, talk to your doctor, pharmacist, or health care provider.  2020 Elsevier/Gold   Standard (2007-12-26 22:10:20)   

## 2019-10-28 ENCOUNTER — Telehealth: Payer: Self-pay | Admitting: Family Medicine

## 2019-10-28 NOTE — Telephone Encounter (Signed)
Please let her know I got her results from Dr. Merlene Pulling and she is due for a follow up (OK for virtual)

## 2019-10-28 NOTE — Telephone Encounter (Signed)
-----   Message from [place], [place] sent at 10/25/2019  4:00 PM EST ----- Dr Merlene Pulling would like for you to review the patient labs. Thanks Bronwen Betters, CMA

## 2019-10-30 ENCOUNTER — Telehealth: Payer: Self-pay

## 2019-10-30 ENCOUNTER — Encounter: Payer: Self-pay | Admitting: Hematology and Oncology

## 2019-10-30 NOTE — Telephone Encounter (Signed)
Informed patient of B12 levels. Per Dr. Merlene Pulling patient needs B12 injection this week and then monthly up until Aprils appt. Which will determine what is done past that point. Patient is agreeable and verbalizes understanding.

## 2019-10-30 NOTE — Telephone Encounter (Signed)
Called pt, appt already scheduled

## 2019-11-02 ENCOUNTER — Encounter: Payer: Self-pay | Admitting: Family Medicine

## 2019-11-02 ENCOUNTER — Other Ambulatory Visit: Payer: Self-pay

## 2019-11-02 ENCOUNTER — Telehealth (INDEPENDENT_AMBULATORY_CARE_PROVIDER_SITE_OTHER): Payer: BC Managed Care – PPO | Admitting: Family Medicine

## 2019-11-02 ENCOUNTER — Inpatient Hospital Stay: Payer: BC Managed Care – PPO | Attending: Hematology and Oncology

## 2019-11-02 DIAGNOSIS — E039 Hypothyroidism, unspecified: Secondary | ICD-10-CM

## 2019-11-02 DIAGNOSIS — F332 Major depressive disorder, recurrent severe without psychotic features: Secondary | ICD-10-CM

## 2019-11-02 DIAGNOSIS — F329 Major depressive disorder, single episode, unspecified: Secondary | ICD-10-CM

## 2019-11-02 DIAGNOSIS — F419 Anxiety disorder, unspecified: Secondary | ICD-10-CM

## 2019-11-02 DIAGNOSIS — F4310 Post-traumatic stress disorder, unspecified: Secondary | ICD-10-CM

## 2019-11-02 DIAGNOSIS — Z8639 Personal history of other endocrine, nutritional and metabolic disease: Secondary | ICD-10-CM | POA: Diagnosis not present

## 2019-11-02 DIAGNOSIS — E538 Deficiency of other specified B group vitamins: Secondary | ICD-10-CM | POA: Diagnosis not present

## 2019-11-02 DIAGNOSIS — F411 Generalized anxiety disorder: Secondary | ICD-10-CM

## 2019-11-02 DIAGNOSIS — Z9884 Bariatric surgery status: Secondary | ICD-10-CM | POA: Diagnosis not present

## 2019-11-02 DIAGNOSIS — R140 Abdominal distension (gaseous): Secondary | ICD-10-CM

## 2019-11-02 DIAGNOSIS — F32A Depression, unspecified: Secondary | ICD-10-CM

## 2019-11-02 DIAGNOSIS — R609 Edema, unspecified: Secondary | ICD-10-CM

## 2019-11-02 DIAGNOSIS — Z136 Encounter for screening for cardiovascular disorders: Secondary | ICD-10-CM | POA: Diagnosis not present

## 2019-11-02 LAB — BAYER DCA HB A1C WAIVED: HB A1C (BAYER DCA - WAIVED): 5 % (ref <7.0)

## 2019-11-02 MED ORDER — SERTRALINE HCL 100 MG PO TABS: 200.0000 mg | ORAL_TABLET | Freq: Every day | ORAL | 1 refills | Status: DC

## 2019-11-02 MED ORDER — CLONAZEPAM 0.5 MG PO TABS: 0.5000 mg | ORAL_TABLET | Freq: Two times a day (BID) | ORAL | 1 refills | Status: DC | PRN

## 2019-11-02 MED ORDER — LEVOTHYROXINE SODIUM 75 MCG PO TABS: 75.0000 ug | ORAL_TABLET | Freq: Every day | ORAL | 2 refills | Status: DC

## 2019-11-02 MED ORDER — BUPROPION HCL ER (SR) 150 MG PO TB12: ORAL_TABLET | ORAL | 1 refills | Status: DC

## 2019-11-02 MED ORDER — CYANOCOBALAMIN 1000 MCG/ML IJ SOLN
1000.0000 ug | Freq: Once | INTRAMUSCULAR | Status: AC
  Administered 2019-11-02: 10:00:00 1000 ug via INTRAMUSCULAR

## 2019-11-02 NOTE — Assessment & Plan Note (Signed)
Under good control on current regimen. Continue current regimen. Continue to monitor. Call with any concerns. Refills given.   

## 2019-11-02 NOTE — Patient Instructions (Signed)

## 2019-11-02 NOTE — Progress Notes (Signed)
There were no vitals taken for this visit.   Subjective:    Patient ID: Sharon Gibson, female    DOB: [birth year], 39 y.o.   MRN: [id number]  HPI: Sharon Gibson is a 39 y.o. female  Chief Complaint  Patient presents with  . Discuss labs    swelling in low legs, low blood pressure. Hematologist wanted patient to consult with you to discuss issues.    [name] presents today with several concerns. She notes that she started working out every day and has been feeling really drained. She notes that when she eats things like grain and bread, she swells- her legs and ankles will be puffy. Hematology wasn't sure if the grains would be causing the leg swelling. She notes that she has swelling in her legs most of the time. No pitting edema per patient report, just puffy. She is concerned that this could have something to do with her heart. She has not had any chest pain. No SOB. She is otherwise feeling well.   DEPRESSION Mood status: stable Satisfied with current treatment?: yes Symptom severity: moderate  Duration of current treatment : chronic Side effects: no Medication compliance: good compliance Psychotherapy/counseling: yes in the past Depressed mood: yes Anxious mood: yes Anhedonia: no Significant weight loss or gain: yes Insomnia: no  Fatigue: yes Feelings of worthlessness or guilt: no Impaired concentration/indecisiveness: no Suicidal ideations: no Hopelessness: no Crying spells: no Depression screen [facility] 2/9 06/06/2019 05/23/2019 12/19/2018 10/14/2018  Decreased Interest 1 3 3 2   Down, Depressed, Hopeless 1 3 3 3   PHQ - 2 Score 2 6 6 5   Altered sleeping 0 3 3 3   Tired, decreased energy 0 3 3 3   Change in appetite 3 3 3 3   Feeling bad or failure about yourself  2 3 3 3   Trouble concentrating 0 3 3 3   Moving slowly or fidgety/restless 0 2 0 0  Suicidal thoughts 0 3 1 1   PHQ-9 Score 7 26 22 21   Difficult doing work/chores Not difficult at all Extremely dIfficult Very  difficult -   GAD 7 : Generalized Anxiety Score 06/06/2019 05/23/2019 12/19/2018 10/14/2018  Nervous, Anxious, on Edge 1 2 3 3   Control/stop worrying 1 2 3 3   Worry too much - different things 1 2 3 3   Trouble relaxing 1 3 3 3   Restless 0 3 3 3   Easily annoyed or irritable 0 1 1 2   Afraid - awful might happen 1 2 3 2   Total GAD 7 Score 5 15 19 19   Anxiety Difficulty Not difficult at all Extremely difficult Extremely difficult Somewhat difficult    Relevant past medical, surgical, family and social history reviewed and updated as indicated. Interim medical history since our last visit reviewed. Allergies and medications reviewed and updated.  Review of Systems  Constitutional: Positive for fatigue. Negative for activity change, appetite change, chills, diaphoresis, fever and unexpected weight change.  Respiratory: Negative.   Cardiovascular: Positive for leg swelling. Negative for chest pain and palpitations.  Gastrointestinal: Negative.        + bloating  Psychiatric/Behavioral: Positive for dysphoric mood. Negative for agitation, behavioral problems, confusion, decreased concentration, hallucinations, self-injury, sleep disturbance and suicidal ideas. The patient is nervous/anxious. The patient is not hyperactive.     Per HPI unless specifically indicated above     Objective:    There were no vitals taken for this visit.  Wt Readings from Last 3 Encounters:  10/25/19 279 lb 1.6 oz (126.6 kg)  09/11/19 282 lb (127.9 kg)  03/20/19 275 lb 7.4 oz (125 kg)    Physical Exam Vitals and nursing note reviewed.  Pulmonary:     Effort: Pulmonary effort is normal. No respiratory distress.     Comments: Speaking in full sentences Neurological:     Mental Status: She is alert.  Psychiatric:        Mood and Affect: Mood normal.        Behavior: Behavior normal.        Thought Content: Thought content normal.        Judgment: Judgment normal.     Results for orders placed or performed  in visit on 11/02/19  Bayer DCA Hb A1c Waived  Result Value Ref Range   HB A1C (BAYER DCA - WAIVED) 5.0 <7.0 %      Assessment & Plan:   Problem List Items Addressed This Visit      Endocrine   Hypothyroidism    Not under good control on last check. Will restart medicine and recheck 6 weeks. Call with any concerns.       Relevant Medications   levothyroxine (SYNTHROID) 75 MCG tablet     Other   S/P gastric bypass - Primary    Due for labs. Orders placed. Will get them ASAP      Relevant Orders   Bayer DCA Hb A1c Waived (Completed)   CBC with Differential/Platelet   Comprehensive metabolic panel   VITAMIN D 25 Hydroxy (Vit-D Deficiency, Fractures)   Lipid Panel w/o Chol/HDL Ratio   Vitamin A   Vitamin B1   PTH, Intact and Calcium   Zinc   PTSD (post-traumatic stress disorder)    Under good control on current regimen. Continue current regimen. Continue to monitor. Call with any concerns. Refills given.        Relevant Medications   sertraline (ZOLOFT) 100 MG tablet   buPROPion (WELLBUTRIN SR) 150 MG 12 hr tablet   Severe recurrent major depression without psychotic features ([place])    Under good control on current regimen. Continue current regimen. Continue to monitor. Call with any concerns. Refills given.        Relevant Medications   sertraline (ZOLOFT) 100 MG tablet   buPROPion (WELLBUTRIN SR) 150 MG 12 hr tablet   Generalized anxiety disorder    Under good control on current regimen. Continue current regimen. Continue to monitor. Call with any concerns. Refills given.        Relevant Medications   sertraline (ZOLOFT) 100 MG tablet   buPROPion (WELLBUTRIN SR) 150 MG 12 hr tablet    Other Visit Diagnoses    Peripheral edema       Unclear if pitting edema. Will check labs. ? due to hypothyroid. Call with any concerns.    Relevant Orders   B Nat Peptide   Bloating       Will check celiac panel. Await results. Call wiith any concerns.    Relevant Orders    Gliadin antibodies, serum   Tissue transglutaminase, IgA   Reticulin Antibody, IgA w reflex titer   Anxiety and depression       Relevant Medications   sertraline (ZOLOFT) 100 MG tablet   buPROPion (WELLBUTRIN SR) 150 MG 12 hr tablet   clonazePAM (KLONOPIN) 0.5 MG tablet       Follow up plan: Return in about 3 months (around 01/30/2020) for physical.    . This visit was completed via telephone due to the restrictions of the  COVID-19 pandemic. All issues as above were discussed and addressed but no physical exam was performed. If it was felt that the patient should be evaluated in the office, they were directed there. The patient verbally consented to this visit. Patient was unable to complete an audio/visual visit due to Technical difficulties. Due to the catastrophic nature of the COVID-19 pandemic, this visit was done through audio contact only. . Location of the patient: home . Location of the provider: home . Those involved with this call:  . Provider: Olevia Perches, DO . CMA: Tiffany Reel, CMA . Front Desk/Registration: Adela Ports  . Time spent on call: 25 minutes on the phone discussing health concerns. 40 minutes total spent in review of patient's record and preparation of their chart.

## 2019-11-02 NOTE — Assessment & Plan Note (Signed)
Not under good control on last check. Will restart medicine and recheck 6 weeks. Call with any concerns.

## 2019-11-02 NOTE — Assessment & Plan Note (Signed)
Due for labs. Orders placed. Will get them ASAP

## 2019-11-03 ENCOUNTER — Telehealth: Payer: Self-pay | Admitting: Family Medicine

## 2019-11-03 ENCOUNTER — Encounter: Payer: Self-pay | Admitting: Family Medicine

## 2019-11-03 ENCOUNTER — Other Ambulatory Visit: Payer: Self-pay | Admitting: Family Medicine

## 2019-11-03 MED ORDER — VITAMIN D (ERGOCALCIFEROL) 1.25 MG (50000 UNIT) PO CAPS: 50000.0000 [IU] | ORAL_CAPSULE | ORAL | 1 refills | Status: DC

## 2019-11-03 NOTE — Telephone Encounter (Signed)
-----   Message from Dorcas Carrow, DO sent at 11/02/2019  5:27 PM EST ----- 3 months physical

## 2019-11-03 NOTE — Telephone Encounter (Signed)
LVM for pt to call back to schedule appt

## 2019-11-06 ENCOUNTER — Ambulatory Visit: Payer: Self-pay | Admitting: Family Medicine

## 2019-11-10 LAB — CBC WITH DIFFERENTIAL/PLATELET
Basophils Absolute: 0.1 10*3/uL (ref 0.0–0.2)
Basos: 1 %
EOS (ABSOLUTE): 0.4 10*3/uL (ref 0.0–0.4)
Eos: 3 %
Hematocrit: 41.2 % (ref 34.0–46.6)
Hemoglobin: 13.8 g/dL (ref 11.1–15.9)
Immature Grans (Abs): 0.1 10*3/uL (ref 0.0–0.1)
Immature Granulocytes: 1 %
Lymphocytes Absolute: 1.9 10*3/uL (ref 0.7–3.1)
Lymphs: 18 %
MCH: 30.3 pg (ref 26.6–33.0)
MCHC: 33.5 g/dL (ref 31.5–35.7)
MCV: 90 fL (ref 79–97)
Monocytes Absolute: 0.7 10*3/uL (ref 0.1–0.9)
Monocytes: 6 %
Neutrophils Absolute: 7.9 10*3/uL — ABNORMAL HIGH (ref 1.4–7.0)
Neutrophils: 71 %
Platelets: 275 10*3/uL (ref 150–450)
RBC: 4.56 x10E6/uL (ref 3.77–5.28)
RDW: 12.6 % (ref 11.7–15.4)
WBC: 11 10*3/uL — ABNORMAL HIGH (ref 3.4–10.8)

## 2019-11-10 LAB — LIPID PANEL W/O CHOL/HDL RATIO
Cholesterol, Total: 173 mg/dL (ref 100–199)
HDL: 57 mg/dL (ref >39)
LDL Chol Calc (NIH): 99 mg/dL (ref 0–99)
Triglycerides: 90 mg/dL (ref 0–149)
VLDL Cholesterol Cal: 17 mg/dL (ref 5–40)

## 2019-11-10 LAB — RETICULIN ANTIBODIES, IGA W TITER: Reticulin Ab, IgA: NEGATIVE titer (ref <2.5)

## 2019-11-10 LAB — COMPREHENSIVE METABOLIC PANEL
ALT: 21 IU/L (ref 0–32)
AST: 21 IU/L (ref 0–40)
Albumin/Globulin Ratio: 2.3 — ABNORMAL HIGH (ref 1.2–2.2)
Albumin: 4.4 g/dL (ref 3.8–4.8)
Alkaline Phosphatase: 83 IU/L (ref 39–117)
BUN/Creatinine Ratio: 33 — ABNORMAL HIGH (ref 9–23)
BUN: 25 mg/dL — ABNORMAL HIGH (ref 6–20)
Bilirubin Total: 0.2 mg/dL (ref 0.0–1.2)
CO2: 20 mmol/L (ref 20–29)
Calcium: 9.5 mg/dL (ref 8.7–10.2)
Chloride: 106 mmol/L (ref 96–106)
Creatinine, Ser: 0.75 mg/dL (ref 0.57–1.00)
GFR calc Af Amer: 116 mL/min/{1.73_m2} (ref >59)
GFR calc non Af Amer: 101 mL/min/{1.73_m2} (ref >59)
Globulin, Total: 1.9 g/dL (ref 1.5–4.5)
Glucose: 81 mg/dL (ref 65–99)
Potassium: 4.5 mmol/L (ref 3.5–5.2)
Sodium: 144 mmol/L (ref 134–144)
Total Protein: 6.3 g/dL (ref 6.0–8.5)

## 2019-11-10 LAB — GLIADIN ANTIBODIES, SERUM
Antigliadin Abs, IgA: 5 units (ref 0–19)
Gliadin IgG: 4 units (ref 0–19)

## 2019-11-10 LAB — ZINC: Zinc: 59 ug/dL (ref 44–115)

## 2019-11-10 LAB — PTH, INTACT AND CALCIUM: PTH: 38 pg/mL (ref 15–65)

## 2019-11-10 LAB — TISSUE TRANSGLUTAMINASE, IGA: Transglutaminase IgA: <2 U/mL (ref 0–3)

## 2019-11-10 LAB — BRAIN NATRIURETIC PEPTIDE: BNP: 35.5 pg/mL (ref 0.0–100.0)

## 2019-11-10 LAB — VITAMIN B1: Thiamine: 110.4 nmol/L (ref 66.5–200.0)

## 2019-11-10 LAB — VITAMIN D 25 HYDROXY (VIT D DEFICIENCY, FRACTURES): Vit D, 25-Hydroxy: 15.7 ng/mL — ABNORMAL LOW (ref 30.0–100.0)

## 2019-11-10 LAB — VITAMIN A: Vitamin A: 38.8 ug/dL (ref 18.9–57.3)

## 2019-11-15 ENCOUNTER — Ambulatory Visit
Admission: EM | Admit: 2019-11-15 | Discharge: 2019-11-15 | Disposition: A | Discharge status: Home / Self Care | Payer: BC Managed Care – PPO | Attending: Family Medicine | Admitting: Family Medicine

## 2019-11-15 DIAGNOSIS — Y93B9 Activity, other involving muscle strengthening exercises: Secondary | ICD-10-CM

## 2019-11-15 DIAGNOSIS — S46811A Strain of other muscles, fascia and tendons at shoulder and upper arm level, right arm, initial encounter: Secondary | ICD-10-CM | POA: Diagnosis not present

## 2019-11-15 DIAGNOSIS — M542 Cervicalgia: Secondary | ICD-10-CM

## 2019-11-15 MED ORDER — KETOROLAC TROMETHAMINE 60 MG/2ML IM SOLN
60.0000 mg | Freq: Once | INTRAMUSCULAR | Status: AC
  Administered 2019-11-15: 60 mg via INTRAMUSCULAR

## 2019-11-15 MED ORDER — CYCLOBENZAPRINE HCL 5 MG PO TABS: 5.0000 mg | ORAL_TABLET | Freq: Three times a day (TID) | ORAL | 0 refills | Status: DC | PRN

## 2019-11-15 MED ORDER — ACETAMINOPHEN 500 MG PO TABS: 1000.0000 mg | ORAL_TABLET | Freq: Three times a day (TID) | ORAL | 0 refills | Status: DC | PRN

## 2019-11-15 NOTE — ED Triage Notes (Signed)
Pt presents with c/o muscular pain that started about a week ago. She states the pain starts around her collarbone, right side, and radiates into her shoulder and neck. She has applied ice with minimal relief. She also took an Aleve with some mild improvement.

## 2019-11-15 NOTE — Discharge Instructions (Signed)
I believe your symptoms are stemming from your trapezius muscle, likely strained, further exacerbated by working likely.  Heat, massage, and light stretching. If swelling apply ice.  Activity as tolerated but I would allow rest to your shoulder and neck until symptoms have improved.  Flexeril as needed, every 8 hours. May cause drowsiness. Please do not take if driving or drinking alcohol.   I would start tylenol regularly as well.  If symptoms worsen or do not improve in the next week to return to be seen or to follow up with your PCP- especially if notice recurrent or persistent swelling above your collar bone

## 2019-11-15 NOTE — ED Provider Notes (Signed)
[facility]    CSN: [id number] Arrival date & time: 11/15/19  4196      History   Chief Complaint Chief Complaint  Patient presents with  . Muscle Pain    right shoulder/neck    HPI Sharon Gibson is a 39 y.o. female.   Sharon Gibson presents with complaints of right shoulder/ neck/ clavicle region pain which started mildly last week but worsening over the past few days. She had started a new work out routine, and feels that the "twisting side planks" likely have contributed. She does work at a computer as well, and is right handed. Pain is constant, 6/10, but worse with certain movements. Wasn't necessarily worse when she woke up, but with activity has increased. No numbness tingling or weakness to right arm. No specific known injury. She applied ice last night as it was swollen to right anterior trapezius, as well as took an aleve, which didn't necessarily help with pain. She is unable to take nsaids regularly due to history of gastric bypass.    ROS per HPI, negative if not otherwise mentioned.      Past Medical History:  Diagnosis Date  . Anxiety   . Chicken pox   . Depression   . Eating disorder    Has had residential treatment and hospitalization previously.  Marland Kitchen Heart murmur    as a child  . History of anemia    RECIEVES IRON INFUSIONS YEARLY  . History of pulmonary embolus (PE) 2002   bilaterally-followed by [facility] hematology-w/u negative per pt  . Hypothyroidism    no meds currently  . PTSD (post-traumatic stress disorder)     Patient Active Problem List   Diagnosis Date Noted  . Other fatigue 04/16/2019  . Endometriosis determined by laparoscopy 06/13/2018  . Ovarian cyst 03/01/2018  . Hx pulmonary embolism 02/03/2018  . Generalized anxiety disorder 10/12/2016  . Right knee pain 09/02/2016  . Severe recurrent major depression without psychotic features ([facility]) 07/23/2016  . Bulimia nervosa, in partial remission, mild 07/23/2016     Class: Chronic  . Iron deficiency anemia 04/08/2016  . B12 deficiency 04/08/2016  . S/P gastric bypass 03/20/2016  . Eating disorder 03/20/2016  . Insomnia 03/20/2016  . PTSD (post-traumatic stress disorder) 03/20/2016  . Hypothyroidism 03/20/2016  . PCOS (polycystic ovarian syndrome) 12/14/2014    Past Surgical History:  Procedure Laterality Date  . CHOLECYSTECTOMY  2003  . EXCISION OF ENDOMETRIOMA    . GASTRIC BYPASS  2003  . LAPAROSCOPIC OVARIAN CYSTECTOMY Left 05/10/2018   Procedure: LAPAROSCOPIC OVARIAN CYSTECTOMY;  Surgeon: Natale Milch, MD;  Location: [facility] ORS;  Service: Gynecology;  Laterality: Left;    OB History    Gravida  0   Para  0   Term  0   Preterm  0   AB  0   Living  0     SAB  0   TAB  0   Ectopic  0   Multiple  0   Live Births  0            Home Medications    Prior to Admission medications   Medication Sig Start Date End Date Taking? Authorizing Provider  buPROPion (WELLBUTRIN SR) 150 MG 12 hr tablet 1 tab qAM x 3-4 days, then 1 tab BID after that 11/02/19  Yes Johnson, Megan P, DO  clonazePAM (KLONOPIN) 0.5 MG tablet Take 1 tablet (0.5 mg total) by mouth 2 (two) times daily as needed for  anxiety. 11/02/19  Yes Johnson, Megan P, DO  levonorgestrel (MIRENA) 20 MCG/24HR IUD 1 each by Intrauterine route once.   Yes [provider]  levothyroxine (SYNTHROID) 75 MCG tablet Take 1 tablet (75 mcg total) by mouth daily. 11/02/19  Yes Johnson, Megan P, DO  Multiple Vitamin (MULTIVITAMIN) tablet Take 1 tablet by mouth daily.   Yes [provider]  sertraline (ZOLOFT) 100 MG tablet Take 2 tablets (200 mg total) by mouth at bedtime. 11/02/19 01/31/20 Yes Johnson, Megan P, DO  Vitamin D, Ergocalciferol, (DRISDOL) 1.25 MG (50000 UNIT) CAPS capsule Take 1 capsule (50,000 Units total) by mouth every 7 (seven) days. 11/03/19  Yes Johnson, Megan P, DO  acetaminophen (TYLENOL) 500 MG tablet Take 2 tablets (1,000 mg total) by mouth every 8  (eight) hours as needed for moderate pain. 11/15/19   Georgetta Haber, NP  cyclobenzaprine (FLEXERIL) 5 MG tablet Take 1-2 tablets (5-10 mg total) by mouth 3 (three) times daily as needed for muscle spasms. 11/15/19   Georgetta Haber, NP    Family History Family History  Problem Relation Age of Onset  . Diabetes Father   . Anxiety disorder Father   . Prostate cancer Paternal Grandfather   . Alzheimer's disease Maternal Grandmother   . Alzheimer's disease Maternal Grandfather   . Lung cancer Paternal Grandmother     Social History Social History   Tobacco Use  . Smoking status: Never Smoker  . Smokeless tobacco: Never Used  Substance Use Topics  . Alcohol use: Yes    Alcohol/week: 0.0 - 1.0 standard drinks    Comment: rare  . Drug use: No     Allergies   Penicillins, Azithromycin, Other, and Strawberry (diagnostic)   Review of Systems Review of Systems   Physical Exam Triage Vital Signs ED Triage Vitals  Enc Vitals Group     BP 11/15/19 0854 117/60     Pulse Rate 11/15/19 0854 72     Resp --      Temp 11/15/19 0854 98.8 F (37.1 C)     Temp Source 11/15/19 0854 Oral     SpO2 11/15/19 0854 100 %     Weight 11/15/19 0852 274 lb (124.3 kg)     Height 11/15/19 0852 5\' 3"  (1.6 m)     Head Circumference --      Peak Flow --      Pain Score 11/15/19 0852 6     Pain Loc --      Pain Edu? --      Excl. in GC? --    No data found.  Updated Vital Signs BP 117/60 (BP Location: Left Arm)   Pulse 72   Temp 98.8 F (37.1 C) (Oral)   Ht 5\' 3"  (1.6 m)   Wt 274 lb (124.3 kg)   SpO2 100%   BMI 48.54 kg/m   Visual Acuity Right Eye Distance:   Left Eye Distance:   Bilateral Distance:    Right Eye Near:   Left Eye Near:    Bilateral Near:     Physical Exam Constitutional:      General: She is not in acute distress.    Appearance: She is well-developed.  Cardiovascular:     Rate and Rhythm: Normal rate.  Pulmonary:     Effort: Pulmonary effort is normal.   Musculoskeletal:     Right shoulder: Tenderness present. No swelling, deformity, effusion, bony tenderness or crepitus. Decreased range of motion. Normal strength. Normal pulse.  Arms:     Comments: Right anterior trapezius extending to right lateral neck and to right deltoid with mild tenderness, pain with engagement such as neck rotation (turning r>L), as well as with raising of right arm noted; pain with ROM but still able to complete full ROm of shoulder; strong radial pulse; no supraclavicular swelling noted; gross sensation intact; no ac joint tenderness, no bony tenderness   Skin:    General: Skin is warm and dry.  Neurological:     Mental Status: She is alert and oriented to person, place, and time.      UC Treatments / Results  Labs (all labs ordered are listed, but only abnormal results are displayed) Labs Reviewed - No data to display  EKG   Radiology No results found.  Procedures Procedures (including critical care time)  Medications Ordered in UC Medications  ketorolac (TORADOL) injection 60 mg (60 mg Intramuscular Given 11/15/19 0920)    Initial Impression / Assessment and Plan / UC Course  I have reviewed the triage vital signs and the nursing notes.  Pertinent labs & imaging results that were available during my care of the patient were reviewed by me and considered in my medical decision making (see chart for details).     Right trapezius strain. toradol IM provided today prior to departure with flexeril and tylenol recommended. Rest. If symptoms worsen or do not improve in the next 2 weeks to return to be seen or to follow up with PCP.  Patient verbalized understanding and agreeable to plan.   Final Clinical Impressions(s) / UC Diagnoses   Final diagnoses:  Trapezius muscle strain, right, initial encounter  Neck pain     Discharge Instructions     I believe your symptoms are stemming from your trapezius muscle, likely strained, further  exacerbated by working likely.  Heat, massage, and light stretching. If swelling apply ice.  Activity as tolerated but I would allow rest to your shoulder and neck until symptoms have improved.  Flexeril as needed, every 8 hours. May cause drowsiness. Please do not take if driving or drinking alcohol.   I would start tylenol regularly as well.  If symptoms worsen or do not improve in the next week to return to be seen or to follow up with your PCP- especially if notice recurrent or persistent swelling above your collar bone     ED Prescriptions    Medication Sig Dispense Auth. Provider   acetaminophen (TYLENOL) 500 MG tablet Take 2 tablets (1,000 mg total) by mouth every 8 (eight) hours as needed for moderate pain. 60 tablet Augusto Gamble B, NP   cyclobenzaprine (FLEXERIL) 5 MG tablet Take 1-2 tablets (5-10 mg total) by mouth 3 (three) times daily as needed for muscle spasms. 20 tablet Zigmund Gottron, NP     PDMP not reviewed this encounter.   Zigmund Gottron, NP 11/15/19 1024

## 2019-11-28 ENCOUNTER — Encounter: Payer: Self-pay | Admitting: Nurse Practitioner

## 2019-11-28 ENCOUNTER — Other Ambulatory Visit: Payer: Self-pay

## 2019-11-28 ENCOUNTER — Ambulatory Visit (INDEPENDENT_AMBULATORY_CARE_PROVIDER_SITE_OTHER): Payer: BC Managed Care – PPO | Admitting: Nurse Practitioner

## 2019-11-28 VITALS — BP 136/68 | HR 71 | Temp 98.1°F

## 2019-11-28 DIAGNOSIS — S161XXD Strain of muscle, fascia and tendon at neck level, subsequent encounter: Secondary | ICD-10-CM | POA: Diagnosis not present

## 2019-11-28 DIAGNOSIS — S161XXA Strain of muscle, fascia and tendon at neck level, initial encounter: Secondary | ICD-10-CM | POA: Insufficient documentation

## 2019-11-28 MED ORDER — DICLOFENAC SODIUM 1 % EX GEL: 2.0000 g | Freq: Four times a day (QID) | CUTANEOUS | 0 refills | Status: DC | PRN

## 2019-11-28 MED ORDER — KETOROLAC TROMETHAMINE 60 MG/2ML IM SOLN
30.0000 mg | Freq: Once | INTRAMUSCULAR | Status: AC
  Administered 2019-11-28: 30 mg via INTRAMUSCULAR

## 2019-11-28 MED ORDER — CYCLOBENZAPRINE HCL 5 MG PO TABS: 5.0000 mg | ORAL_TABLET | Freq: Three times a day (TID) | ORAL | 0 refills | Status: DC | PRN

## 2019-11-28 MED ORDER — PREDNISONE 50 MG PO TABS: 50.0000 mg | ORAL_TABLET | Freq: Every day | ORAL | 0 refills | Status: DC

## 2019-11-28 NOTE — Assessment & Plan Note (Signed)
Acute, ongoing.  Advised supportive measures: rest, stretch, use heat/ice, posture techniques.  Neck, shoulder, and back exercises given.  Unable to take NSAIDs orally; will give Toradol injection, burst of prednisone, refill muscle relaxer, and use diclofenac gel for topical pain.  Encouraged to increase water intake and to keep massage appointment Friday; will write note for work and send to [employer].  If supportive measures fail, may consider physical therapy.

## 2019-11-28 NOTE — Progress Notes (Signed)
BP 136/68 (BP Location: Right Arm, Patient Position: Sitting, Cuff Size: Normal)   Pulse 71   Temp 98.1 F (36.7 C) (Oral)   SpO2 98%    Subjective:    Patient ID: Sharon Gibson, female    DOB: Feb 18, 1981, 39 y.o.   MRN: [id number]  HPI: Sharon Gibson is a 39 y.o. female  Chief Complaint  Patient presents with  . Neck Pain    Possible muscle strain from working out. Pain becoming worse and radiating down arms and back.   NECK PAIN Onset about 1 month; 2-3 weeks of severe pain Diagnosis: trapezius muscle strain Status: worse Treatments attempted: heat, ice, biofreeze, Tylenol, muscle relaxers, ibuprofen   Compliant with recommended treatment: yes Relief with NSAIDs?:  unable to take NSAIDs routinely Location:R>L Duration:weeks Severity: severe Quality: stabbing Frequency: intermittent Radiation: yes; shoulder to shoulder blade Aggravating factors: lifting, movement and prolonged sitting, driving Alleviating factors: heat, heat, APAP and muscle relaxer Weakness:  yes Paresthesias / decreased sensation:  no  Fevers:  no  Incontinence: none or urine of stool Pain initially started around right collarbone; no known injury or mechanical fall.  Patient states in graduate school, she injured the same arm in a lower area and underwent physical therapy with improvement.   Has a massage therapist appointment on Friday for the neck/shoulder pain.  Allergies  Allergen Reactions  . Penicillins Hives    Has patient had a PCN reaction causing immediate rash, facial/tongue/throat swelling, SOB or lightheadedness with hypotension: yes Has patient had a PCN reaction causing severe rash involving mucus membranes or skin necrosis: no Has patient had a PCN reaction that required hospitalization: yes Has patient had a PCN reaction occurring within the last 10 years: no If all of the above answers are "NO", then may proceed with Cephalosporin use.   . Azithromycin   . Other     Narcotics- Burning in the stomach (whether PO or IV)  . Strawberry (Diagnostic) Swelling    AND WALNUTS-TONGUE SWELLING   Outpatient Encounter Medications as of 11/28/2019  Medication Sig  . acetaminophen (TYLENOL) 500 MG tablet Take 2 tablets (1,000 mg total) by mouth every 8 (eight) hours as needed for moderate pain.  Marland Kitchen buPROPion (WELLBUTRIN SR) 150 MG 12 hr tablet 1 tab qAM x 3-4 days, then 1 tab BID after that  . clonazePAM (KLONOPIN) 0.5 MG tablet Take 1 tablet (0.5 mg total) by mouth 2 (two) times daily as needed for anxiety.  Marland Kitchen levonorgestrel (MIRENA) 20 MCG/24HR IUD 1 each by Intrauterine route once.  Marland Kitchen levothyroxine (SYNTHROID) 75 MCG tablet Take 1 tablet (75 mcg total) by mouth daily.  . Multiple Vitamin (MULTIVITAMIN) tablet Take 1 tablet by mouth daily.  . sertraline (ZOLOFT) 100 MG tablet Take 2 tablets (200 mg total) by mouth at bedtime.  . Vitamin D, Ergocalciferol, (DRISDOL) 1.25 MG (50000 UNIT) CAPS capsule Take 1 capsule (50,000 Units total) by mouth every 7 (seven) days.  . cyclobenzaprine (FLEXERIL) 5 MG tablet Take 1-2 tablets (5-10 mg total) by mouth 3 (three) times daily as needed for muscle spasms.  . diclofenac Sodium (VOLTAREN) 1 % GEL Apply 2 g topically 4 (four) times daily as needed (neck/shoulder pain).  . predniSONE (DELTASONE) 50 MG tablet Take 1 tablet (50 mg total) by mouth daily with breakfast.  . [DISCONTINUED] cyclobenzaprine (FLEXERIL) 5 MG tablet Take 1-2 tablets (5-10 mg total) by mouth 3 (three) times daily as needed for muscle spasms. (Patient not taking: Reported on 11/28/2019)  . [  EXPIRED] ketorolac (TORADOL) injection 30 mg    No facility-administered encounter medications on file as of 11/28/2019.   Patient Active Problem List   Diagnosis Date Noted  . Cervical strain 11/28/2019  . Other fatigue 04/16/2019  . Endometriosis determined by laparoscopy 06/13/2018  . Ovarian cyst 03/01/2018  . Hx pulmonary embolism 02/03/2018  . Generalized anxiety  disorder 10/12/2016  . Right knee pain 09/02/2016  . Severe recurrent major depression without psychotic features ([place]) 07/23/2016  . Bulimia nervosa, in partial remission, mild 07/23/2016    Class: Chronic  . Iron deficiency anemia 04/08/2016  . B12 deficiency 04/08/2016  . S/P gastric bypass 03/20/2016  . Eating disorder 03/20/2016  . Insomnia 03/20/2016  . PTSD (post-traumatic stress disorder) 03/20/2016  . Hypothyroidism 03/20/2016  . PCOS (polycystic ovarian syndrome) 12/14/2014   Past Medical History:  Diagnosis Date  . Anxiety   . Chicken pox   . Depression   . Eating disorder    Has had residential treatment and hospitalization previously.  Marland Kitchen Heart murmur    as a child  . History of anemia    RECIEVES IRON INFUSIONS YEARLY  . History of pulmonary embolus (PE) 2002   bilaterally-followed by [facility] hematology-w/u negative per pt  . Hypothyroidism    no meds currently  . PTSD (post-traumatic stress disorder)    Relevant past medical, surgical, family and social history reviewed and updated as indicated. Interim medical history since our last visit reviewed.  Review of Systems  Constitutional: Negative.  Negative for fatigue and fever.  Respiratory: Negative.  Negative for shortness of breath.   Cardiovascular: Negative.  Negative for chest pain.  Gastrointestinal: Negative.   Genitourinary: Negative.  Negative for enuresis.  Musculoskeletal: Positive for back pain, myalgias and neck pain. Negative for arthralgias, gait problem, joint swelling and neck stiffness.  Skin: Negative.  Negative for color change and rash.  Neurological: Positive for headaches. Negative for dizziness and light-headedness.   Per HPI unless specifically indicated above     Objective:    BP 136/68 (BP Location: Right Arm, Patient Position: Sitting, Cuff Size: Normal)   Pulse 71   Temp 98.1 F (36.7 C) (Oral)   SpO2 98%     Physical Exam Vitals and nursing note reviewed.    Constitutional:      Appearance: Normal appearance.  Cardiovascular:     Rate and Rhythm: Normal rate and regular rhythm.     Heart sounds: No murmur.  Pulmonary:     Effort: Pulmonary effort is normal. No respiratory distress.     Breath sounds: Normal breath sounds. No wheezing or rhonchi.  Abdominal:     General: Abdomen is flat. There is no distension.     Palpations: Abdomen is soft.  Musculoskeletal:        General: Tenderness present. No swelling.     Right shoulder: Tenderness present. No swelling, bony tenderness or crepitus. Decreased range of motion. Normal strength. Normal pulse.     Left shoulder: Normal. No swelling. Normal range of motion.  Skin:    General: Skin is warm and dry.     Coloration: Skin is not jaundiced or pale.  Neurological:     General: No focal deficit present.     Mental Status: She is alert and oriented to person, place, and time.     Motor: No weakness.     Gait: Gait normal.  Psychiatric:        Mood and Affect: Mood normal.  Behavior: Behavior normal.        Thought Content: Thought content normal.        Judgment: Judgment normal.    Results for orders placed or performed in visit on 11/02/19  Bayer DCA Hb A1c Waived  Result Value Ref Range   HB A1C (BAYER DCA - WAIVED) 5.0 <7.0 %  CBC with Differential/Platelet  Result Value Ref Range   WBC 11.0 (H) 3.4 - 10.8 x10E3/uL   RBC 4.56 3.77 - 5.28 x10E6/uL   Hemoglobin 13.8 11.1 - 15.9 g/dL   Hematocrit 35.0 09.3 - 46.6 %   MCV 90 79 - 97 fL   MCH 30.3 26.6 - 33.0 pg   MCHC 33.5 31.5 - 35.7 g/dL   RDW 81.8 29.9 - 37.1 %   Platelets 275 150 - 450 x10E3/uL   Neutrophils 71 Not Estab. %   Lymphs 18 Not Estab. %   Monocytes 6 Not Estab. %   Eos 3 Not Estab. %   Basos 1 Not Estab. %   Neutrophils Absolute 7.9 (H) 1.4 - 7.0 x10E3/uL   Lymphocytes Absolute 1.9 0.7 - 3.1 x10E3/uL   Monocytes Absolute 0.7 0.1 - 0.9 x10E3/uL   EOS (ABSOLUTE) 0.4 0.0 - 0.4 x10E3/uL   Basophils  Absolute 0.1 0.0 - 0.2 x10E3/uL   Immature Granulocytes 1 Not Estab. %   Immature Grans (Abs) 0.1 0.0 - 0.1 x10E3/uL  Comprehensive metabolic panel  Result Value Ref Range   Glucose 81 65 - 99 mg/dL   BUN 25 (H) 6 - 20 mg/dL   Creatinine, Ser 6.96 0.57 - 1.00 mg/dL   GFR calc non Af Amer 101 >59 mL/min/1.73   GFR calc Af Amer 116 >59 mL/min/1.73   BUN/Creatinine Ratio 33 (H) 9 - 23   Sodium 144 134 - 144 mmol/L   Potassium 4.5 3.5 - 5.2 mmol/L   Chloride 106 96 - 106 mmol/L   CO2 20 20 - 29 mmol/L   Calcium 9.5 8.7 - 10.2 mg/dL   Total Protein 6.3 6.0 - 8.5 g/dL   Albumin 4.4 3.8 - 4.8 g/dL   Globulin, Total 1.9 1.5 - 4.5 g/dL   Albumin/Globulin Ratio 2.3 (H) 1.2 - 2.2   Bilirubin Total 0.2 0.0 - 1.2 mg/dL   Alkaline Phosphatase 83 39 - 117 IU/L   AST 21 0 - 40 IU/L   ALT 21 0 - 32 IU/L  VITAMIN D 25 Hydroxy (Vit-D Deficiency, Fractures)  Result Value Ref Range   Vit D, 25-Hydroxy 15.7 (L) 30.0 - 100.0 ng/mL  Lipid Panel w/o Chol/HDL Ratio  Result Value Ref Range   Cholesterol, Total 173 100 - 199 mg/dL   Triglycerides 90 0 - 149 mg/dL   HDL 57 >78 mg/dL   VLDL Cholesterol Cal 17 5 - 40 mg/dL   LDL Chol Calc (NIH) 99 0 - 99 mg/dL  Vitamin A  Result Value Ref Range   Vitamin A 38.8 18.9 - 57.3 ug/dL  Vitamin B1  Result Value Ref Range   Thiamine 110.4 66.5 - 200.0 nmol/L  PTH, Intact and Calcium  Result Value Ref Range   PTH 38 15 - 65 pg/mL   PTH Interp Comment   Zinc  Result Value Ref Range   Zinc 59 44 - 115 ug/dL  Gliadin antibodies, serum  Result Value Ref Range   Antigliadin Abs, IgA 5 0 - 19 units   Gliadin IgG 4 0 - 19 units  Tissue transglutaminase, IgA  Result Value  Ref Range   Transglutaminase IgA <2 0 - 3 U/mL  Reticulin Antibody, IgA w reflex titer  Result Value Ref Range   Reticulin Ab, IgA Negative Neg:<1:2.5 titer  B Nat Peptide  Result Value Ref Range   BNP 35.5 0.0 - 100.0 pg/mL      Assessment & Plan:   Problem List Items Addressed  This Visit      Musculoskeletal and Integument   Cervical strain - Primary    Acute, ongoing.  Advised supportive measures: rest, stretch, use heat/ice, posture techniques.  Neck, shoulder, and back exercises given.  Unable to take NSAIDs orally; will give Toradol injection, burst of prednisone, refill muscle relaxer, and use diclofenac gel for topical pain.  Encouraged to increase water intake and to keep massage appointment Friday; will write note for work and send to [employer].  If supportive measures fail, may consider physical therapy.      Relevant Medications   predniSONE (DELTASONE) 50 MG tablet   cyclobenzaprine (FLEXERIL) 5 MG tablet   diclofenac Sodium (VOLTAREN) 1 % GEL       Follow up plan: Return if symptoms worsen or fail to improve.

## 2019-11-28 NOTE — Patient Instructions (Signed)
Nice meeting you today!  I hope your neck feels better soon, please let us know if you do not, we can get the referral to Physical Therapy started.  Neck Exercises Ask your health care provider which exercises are safe for you. Do exercises exactly as told by your health care provider and adjust them as directed. It is normal to feel mild stretching, pulling, tightness, or discomfort as you do these exercises. Stop right away if you feel sudden pain or your pain gets worse. Do not begin these exercises until told by your health care provider. Neck exercises can be important for many reasons. They can improve strength and maintain flexibility in your neck, which will help your upper back and prevent neck pain. Stretching exercises Rotation neck stretching  1. Sit in a chair or stand up. 2. Place your feet flat on the floor, shoulder width apart. 3. Slowly turn your head (rotate) to the right until a slight stretch is felt. Turn it all the way to the right so you can look over your right shoulder. Do not tilt or tip your head. 4. Hold this position for 10-30 seconds. 5. Slowly turn your head (rotate) to the left until a slight stretch is felt. Turn it all the way to the left so you can look over your left shoulder. Do not tilt or tip your head. 6. Hold this position for 10-30 seconds. Repeat __________ times. Complete this exercise __________ times a day. Neck retraction 1. Sit in a sturdy chair or stand up. 2. Look straight ahead. Do not bend your neck. 3. Use your fingers to push your chin backward (retraction). Do not bend your neck for this movement. Continue to face straight ahead. If you are doing the exercise properly, you will feel a slight sensation in your throat and a stretch at the back of your neck. 4. Hold the stretch for 1-2 seconds. Repeat __________ times. Complete this exercise __________ times a day. Strengthening exercises Neck press 1. Lie on your back on a firm bed or on  the floor with a pillow under your head. 2. Use your neck muscles to push your head down on the pillow and straighten your spine. 3. Hold the position as well as you can. Keep your head facing up (in a neutral position) and your chin tucked. 4. Slowly count to 5 while holding this position. Repeat __________ times. Complete this exercise __________ times a day. Isometrics These are exercises in which you strengthen the muscles in your neck while keeping your neck still (isometrics). 1. Sit in a supportive chair and place your hand on your forehead. 2. Keep your head and face facing straight ahead. Do not flex or extend your neck while doing isometrics. 3. Push forward with your head and neck while pushing back with your hand. Hold for 10 seconds. 4. Do the sequence again, this time putting your hand against the back of your head. Use your head and neck to push backward against the hand pressure. 5. Finally, do the same exercise on either side of your head, pushing sideways against the pressure of your hand. Repeat __________ times. Complete this exercise __________ times a day. Prone head lifts 1. Lie face-down (prone position), resting on your elbows so that your chest and upper back are raised. 2. Start with your head facing downward, near your chest. Position your chin either on or near your chest. 3. Slowly lift your head upward. Lift until you are looking straight ahead. Then  continue lifting your head as far back as you can comfortably stretch. 4. Hold your head up for 5 seconds. Then slowly lower it to your starting position. Repeat __________ times. Complete this exercise __________ times a day. Supine head lifts 1. Lie on your back (supine position), bending your knees to point to the ceiling and keeping your feet flat on the floor. 2. Lift your head slowly off the floor, raising your chin toward your chest. 3. Hold for 5 seconds. Repeat __________ times. Complete this exercise  __________ times a day. Scapular retraction 1. Stand with your arms at your sides. Look straight ahead. 2. Slowly pull both shoulders (scapulae) backward and downward (retraction) until you feel a stretch between your shoulder blades in your upper back. 3. Hold for 10-30 seconds. 4. Relax and repeat. Repeat __________ times. Complete this exercise __________ times a day. Contact a health care provider if:  Your neck pain or discomfort gets much worse when you do an exercise.  Your neck pain or discomfort does not improve within 2 hours after you exercise. If you have any of these problems, stop exercising right away. Do not do the exercises again unless your health care provider says that you can. Get help right away if:  You develop sudden, severe neck pain. If this happens, stop exercising right away. Do not do the exercises again unless your health care provider says that you can. This information is not intended to replace advice given to you by your health care provider. Make sure you discuss any questions you have with your health care provider. Document Revised: 07/13/2018 Document Reviewed: 07/13/2018 [employer] Patient Education  [place].  Shoulder Exercises Ask your health care provider which exercises are safe for you. Do exercises exactly as told by your health care provider and adjust them as directed. It is normal to feel mild stretching, pulling, tightness, or discomfort as you do these exercises. Stop right away if you feel sudden pain or your pain gets worse. Do not begin these exercises until told by your health care provider. Stretching exercises External rotation and abduction This exercise is sometimes called corner stretch. This exercise rotates your arm outward (external rotation) and moves your arm out from your body (abduction). 1. Stand in a doorway with one of your feet slightly in front of the other. This is called a staggered stance. If you cannot reach  your forearms to the door frame, stand facing a corner of a room. 2. Choose one of the following positions as told by your health care provider: ? Place your hands and forearms on the door frame above your head. ? Place your hands and forearms on the door frame at the height of your head. ? Place your hands on the door frame at the height of your elbows. 3. Slowly move your weight onto your front foot until you feel a stretch across your chest and in the front of your shoulders. Keep your head and chest upright and keep your abdominal muscles tight. 4. Hold for __________ seconds. 5. To release the stretch, shift your weight to your back foot. Repeat __________ times. Complete this exercise __________ times a day. Extension, standing 1. Stand and hold a broomstick, a cane, or a similar object behind your back. ? Your hands should be a little wider than shoulder width apart. ? Your palms should face away from your back. 2. Keeping your elbows straight and your shoulder muscles relaxed, move the stick away from your  body until you feel a stretch in your shoulders (extension). ? Avoid shrugging your shoulders while you move the stick. Keep your shoulder blades tucked down toward the middle of your back. 3. Hold for __________ seconds. 4. Slowly return to the starting position. Repeat __________ times. Complete this exercise __________ times a day. Range-of-motion exercises Pendulum  1. Stand near a wall or a surface that you can hold onto for balance. 2. Bend at the waist and let your left / right arm hang straight down. Use your other arm to support you. Keep your back straight and do not lock your knees. 3. Relax your left / right arm and shoulder muscles, and move your hips and your trunk so your left / right arm swings freely. Your arm should swing because of the motion of your body, not because you are using your arm or shoulder muscles. 4. Keep moving your hips and trunk so your arm swings  in the following directions, as told by your health care provider: ? Side to side. ? Forward and backward. ? In clockwise and counterclockwise circles. 5. Continue each motion for __________ seconds, or for as long as told by your health care provider. 6. Slowly return to the starting position. Repeat __________ times. Complete this exercise __________ times a day. Shoulder flexion, standing  1. Stand and hold a broomstick, a cane, or a similar object. Place your hands a little more than shoulder width apart on the object. Your left / right hand should be palm up, and your other hand should be palm down. 2. Keep your elbow straight and your shoulder muscles relaxed. Push the stick up with your healthy arm to raise your left / right arm in front of your body, and then over your head until you feel a stretch in your shoulder (flexion). ? Avoid shrugging your shoulder while you raise your arm. Keep your shoulder blade tucked down toward the middle of your back. 3. Hold for __________ seconds. 4. Slowly return to the starting position. Repeat __________ times. Complete this exercise __________ times a day. Shoulder abduction, standing 1. Stand and hold a broomstick, a cane, or a similar object. Place your hands a little more than shoulder width apart on the object. Your left / right hand should be palm up, and your other hand should be palm down. 2. Keep your elbow straight and your shoulder muscles relaxed. Push the object across your body toward your left / right side. Raise your left / right arm to the side of your body (abduction) until you feel a stretch in your shoulder. ? Do not raise your arm above shoulder height unless your health care provider tells you to do that. ? If directed, raise your arm over your head. ? Avoid shrugging your shoulder while you raise your arm. Keep your shoulder blade tucked down toward the middle of your back. 3. Hold for __________ seconds. 4. Slowly return to  the starting position. Repeat __________ times. Complete this exercise __________ times a day. Internal rotation  1. Place your left / right hand behind your back, palm up. 2. Use your other hand to dangle an exercise band, a towel, or a similar object over your shoulder. Grasp the band with your left / right hand so you are holding on to both ends. 3. Gently pull up on the band until you feel a stretch in the front of your left / right shoulder. The movement of your arm toward the center of your  body is called internal rotation. ? Avoid shrugging your shoulder while you raise your arm. Keep your shoulder blade tucked down toward the middle of your back. 4. Hold for __________ seconds. 5. Release the stretch by letting go of the band and lowering your hands. Repeat __________ times. Complete this exercise __________ times a day. Strengthening exercises External rotation  1. Sit in a stable chair without armrests. 2. Secure an exercise band to a stable object at elbow height on your left / right side. 3. Place a soft object, such as a folded towel or a small pillow, between your left / right upper arm and your body to move your elbow about 4 inches (10 cm) away from your side. 4. Hold the end of the exercise band so it is tight and there is no slack. 5. Keeping your elbow pressed against the soft object, slowly move your forearm out, away from your abdomen (external rotation). Keep your body steady so only your forearm moves. 6. Hold for __________ seconds. 7. Slowly return to the starting position. Repeat __________ times. Complete this exercise __________ times a day. Shoulder abduction  1. Sit in a stable chair without armrests, or stand up. 2. Hold a __________ weight in your left / right hand, or hold an exercise band with both hands. 3. Start with your arms straight down and your left / right palm facing in, toward your body. 4. Slowly lift your left / right hand out to your side  (abduction). Do not lift your hand above shoulder height unless your health care provider tells you that this is safe. ? Keep your arms straight. ? Avoid shrugging your shoulder while you do this movement. Keep your shoulder blade tucked down toward the middle of your back. 5. Hold for __________ seconds. 6. Slowly lower your arm, and return to the starting position. Repeat __________ times. Complete this exercise __________ times a day. Shoulder extension 1. Sit in a stable chair without armrests, or stand up. 2. Secure an exercise band to a stable object in front of you so it is at shoulder height. 3. Hold one end of the exercise band in each hand. Your palms should face each other. 4. Straighten your elbows and lift your hands up to shoulder height. 5. Step back, away from the secured end of the exercise band, until the band is tight and there is no slack. 6. Squeeze your shoulder blades together as you pull your hands down to the sides of your thighs (extension). Stop when your hands are straight down by your sides. Do not let your hands go behind your body. 7. Hold for __________ seconds. 8. Slowly return to the starting position. Repeat __________ times. Complete this exercise __________ times a day. Shoulder row 1. Sit in a stable chair without armrests, or stand up. 2. Secure an exercise band to a stable object in front of you so it is at waist height. 3. Hold one end of the exercise band in each hand. Position your palms so that your thumbs are facing the ceiling (neutral position). 4. Bend each of your elbows to a 90-degree angle (right angle) and keep your upper arms at your sides. 5. Step back until the band is tight and there is no slack. 6. Slowly pull your elbows back behind you. 7. Hold for __________ seconds. 8. Slowly return to the starting position. Repeat __________ times. Complete this exercise __________ times a day. Shoulder press-ups  1. Sit in a stable chair  that  has armrests. Sit upright, with your feet flat on the floor. 2. Put your hands on the armrests so your elbows are bent and your fingers are pointing forward. Your hands should be about even with the sides of your body. 3. Push down on the armrests and use your arms to lift yourself off the chair. Straighten your elbows and lift yourself up as much as you comfortably can. ? Move your shoulder blades down, and avoid letting your shoulders move up toward your ears. ? Keep your feet on the ground. As you get stronger, your feet should support less of your body weight as you lift yourself up. 4. Hold for __________ seconds. 5. Slowly lower yourself back into the chair. Repeat __________ times. Complete this exercise __________ times a day. Wall push-ups  1. Stand so you are facing a stable wall. Your feet should be about one arm-length away from the wall. 2. Lean forward and place your palms on the wall at shoulder height. 3. Keep your feet flat on the floor as you bend your elbows and lean forward toward the wall. 4. Hold for __________ seconds. 5. Straighten your elbows to push yourself back to the starting position. Repeat __________ times. Complete this exercise __________ times a day. This information is not intended to replace advice given to you by your health care provider. Make sure you discuss any questions you have with your health care provider. Document Revised: 01/06/2019 Document Reviewed: 10/14/2018 [employer] Patient Education  2020 [employer]  Back Exercises The following exercises strengthen the muscles that help to support the trunk and back. They also help to keep the lower back flexible. Doing these exercises can help to prevent back pain or lessen existing pain.  If you have back pain or discomfort, try doing these exercises 2-3 times each day or as told by your health care provider.  As your pain improves, do them once each day, but increase the number of times that you  repeat the steps for each exercise (do more repetitions).  To prevent the recurrence of back pain, continue to do these exercises once each day or as told by your health care provider. Do exercises exactly as told by your health care provider and adjust them as directed. It is normal to feel mild stretching, pulling, tightness, or discomfort as you do these exercises, but you should stop right away if you feel sudden pain or your pain gets worse. Exercises Single knee to chest Repeat these steps 3-5 times for each leg: 1. Lie on your back on a firm bed or the floor with your legs extended. 2. Bring one knee to your chest. Your other leg should stay extended and in contact with the floor. 3. Hold your knee in place by grabbing your knee or thigh with both hands and hold. 4. Pull on your knee until you feel a gentle stretch in your lower back or buttocks. 5. Hold the stretch for 10-30 seconds. 6. Slowly release and straighten your leg. Pelvic tilt Repeat these steps 5-10 times: 1. Lie on your back on a firm bed or the floor with your legs extended. 2. Bend your knees so they are pointing toward the ceiling and your feet are flat on the floor. 3. Tighten your lower abdominal muscles to press your lower back against the floor. This motion will tilt your pelvis so your tailbone points up toward the ceiling instead of pointing to your feet or the floor. 4. With gentle  tension and even breathing, hold this position for 5-10 seconds. Cat-cow Repeat these steps until your lower back becomes more flexible: 1. Get into a hands-and-knees position on a firm surface. Keep your hands under your shoulders, and keep your knees under your hips. You may place padding under your knees for comfort. 2. Let your head hang down toward your chest. Contract your abdominal muscles and point your tailbone toward the floor so your lower back becomes rounded like the back of a cat. 3. Hold this position for 5  seconds. 4. Slowly lift your head, let your abdominal muscles relax and point your tailbone up toward the ceiling so your back forms a sagging arch like the back of a cow. 5. Hold this position for 5 seconds.  Press-ups Repeat these steps 5-10 times: 1. Lie on your abdomen (face-down) on the floor. 2. Place your palms near your head, about shoulder-width apart. 3. Keeping your back as relaxed as possible and keeping your hips on the floor, slowly straighten your arms to raise the top half of your body and lift your shoulders. Do not use your back muscles to raise your upper torso. You may adjust the placement of your hands to make yourself more comfortable. 4. Hold this position for 5 seconds while you keep your back relaxed. 5. Slowly return to lying flat on the floor.  Bridges Repeat these steps 10 times: 1. Lie on your back on a firm surface. 2. Bend your knees so they are pointing toward the ceiling and your feet are flat on the floor. Your arms should be flat at your sides, next to your body. 3. Tighten your buttocks muscles and lift your buttocks off the floor until your waist is at almost the same height as your knees. You should feel the muscles working in your buttocks and the back of your thighs. If you do not feel these muscles, slide your feet 1-2 inches farther away from your buttocks. 4. Hold this position for 3-5 seconds. 5. Slowly lower your hips to the starting position, and allow your buttocks muscles to relax completely. If this exercise is too easy, try doing it with your arms crossed over your chest. Abdominal crunches Repeat these steps 5-10 times: 1. Lie on your back on a firm bed or the floor with your legs extended. 2. Bend your knees so they are pointing toward the ceiling and your feet are flat on the floor. 3. Cross your arms over your chest. 4. Tip your chin slightly toward your chest without bending your neck. 5. Tighten your abdominal muscles and slowly raise  your trunk (torso) high enough to lift your shoulder blades a tiny bit off the floor. Avoid raising your torso higher than that because it can put too much stress on your low back and does not help to strengthen your abdominal muscles. 6. Slowly return to your starting position. Back lifts Repeat these steps 5-10 times: 1. Lie on your abdomen (face-down) with your arms at your sides, and rest your forehead on the floor. 2. Tighten the muscles in your legs and your buttocks. 3. Slowly lift your chest off the floor while you keep your hips pressed to the floor. Keep the back of your head in line with the curve in your back. Your eyes should be looking at the floor. 4. Hold this position for 3-5 seconds. 5. Slowly return to your starting position. Contact a health care provider if:  Your back pain or discomfort gets much  worse when you do an exercise.  Your worsening back pain or discomfort does not lessen within 2 hours after you exercise. If you have any of these problems, stop doing these exercises right away. Do not do them again unless your health care provider says that you can. Get help right away if:  You develop sudden, severe back pain. If this happens, stop doing the exercises right away. Do not do them again unless your health care provider says that you can. This information is not intended to replace advice given to you by your health care provider. Make sure you discuss any questions you have with your health care provider. Document Revised: 01/19/2019 Document Reviewed: 06/16/2018 Elsevier Patient Education  2020 [employer].

## 2019-11-29 ENCOUNTER — Telehealth: Payer: Self-pay

## 2019-11-29 NOTE — Telephone Encounter (Signed)
Prior Authorization initiated via CoverMyMeds for Diclofenac Sodium 1% ge Key: [id number]

## 2019-11-30 ENCOUNTER — Inpatient Hospital Stay: Payer: BC Managed Care – PPO | Attending: Hematology and Oncology

## 2019-12-06 NOTE — Telephone Encounter (Signed)
PA Approved

## 2019-12-11 ENCOUNTER — Ambulatory Visit
Admission: RE | Admit: 2019-12-11 | Discharge: 2019-12-11 | Disposition: A | Discharge status: Home / Self Care | Payer: BC Managed Care – PPO | Source: Ambulatory Visit | Attending: Family Medicine | Admitting: Family Medicine

## 2019-12-11 ENCOUNTER — Encounter: Payer: Self-pay | Admitting: Family Medicine

## 2019-12-11 ENCOUNTER — Ambulatory Visit (INDEPENDENT_AMBULATORY_CARE_PROVIDER_SITE_OTHER): Payer: BC Managed Care – PPO | Admitting: Family Medicine

## 2019-12-11 ENCOUNTER — Other Ambulatory Visit: Payer: Self-pay

## 2019-12-11 VITALS — BP 100/64 | HR 80 | Temp 98.5°F

## 2019-12-11 DIAGNOSIS — F419 Anxiety disorder, unspecified: Secondary | ICD-10-CM | POA: Diagnosis not present

## 2019-12-11 DIAGNOSIS — F32A Depression, unspecified: Secondary | ICD-10-CM

## 2019-12-11 DIAGNOSIS — R609 Edema, unspecified: Secondary | ICD-10-CM | POA: Diagnosis not present

## 2019-12-11 DIAGNOSIS — S161XXD Strain of muscle, fascia and tendon at neck level, subsequent encounter: Secondary | ICD-10-CM

## 2019-12-11 DIAGNOSIS — F411 Generalized anxiety disorder: Secondary | ICD-10-CM | POA: Diagnosis not present

## 2019-12-11 DIAGNOSIS — M25511 Pain in right shoulder: Secondary | ICD-10-CM | POA: Diagnosis not present

## 2019-12-11 DIAGNOSIS — F329 Major depressive disorder, single episode, unspecified: Secondary | ICD-10-CM

## 2019-12-11 DIAGNOSIS — M542 Cervicalgia: Secondary | ICD-10-CM | POA: Diagnosis not present

## 2019-12-11 MED ORDER — CYCLOBENZAPRINE HCL 10 MG PO TABS: 10.0000 mg | ORAL_TABLET | Freq: Every day | ORAL | 0 refills | Status: DC

## 2019-12-11 MED ORDER — CLONAZEPAM 0.5 MG PO TABS: 0.5000 mg | ORAL_TABLET | Freq: Two times a day (BID) | ORAL | 0 refills | Status: DC | PRN

## 2019-12-11 NOTE — Progress Notes (Signed)
BP 100/64   Pulse 80   Temp 98.5 F (36.9 C)   SpO2 97%    Subjective:    Patient ID: Sharon Gibson, female    DOB: Nov 20, 1980, 39 y.o.   MRN: [id number]  HPI: Sharon Gibson is a 39 y.o. female  Chief Complaint  Patient presents with  . Edema  . Neck Pain   Feels like her feet and ankles have been swelling when she is eating grains. She notes that they don't pit, but feel more puffy and make it harder for her to fit her boots.    NECK PAIN FOLLOW UP Status: exacerbated Treatments attempted: rest, ice, heat, APAP, ibuprofen, aleve and muscle relaxer  Compliant with recommended treatment: yes Relief with NSAIDs?:  mild Location:R>L Duration:weeks Severity: severe Quality: sharp, pulling, aching Frequency: constant Radiation: R arm, headache Aggravating factors: movement Alleviating factors: heat, NSAIDs and muscle relaxer Weakness:  no Paresthesias / decreased sensation:  yes  Fevers:  no  ANXIETY/STRESS Duration: Chronic Status:stable Anxious mood: yes  Excessive worrying: yes Irritability: yes  Sweating: no Nausea: no Palpitations:no Hyperventilation: no Panic attacks: no Agoraphobia: no  Obscessions/compulsions: no Depressed mood: yes Depression screen [facility] 2/9 12/11/2019 06/06/2019 05/23/2019 12/19/2018 10/14/2018  Decreased Interest 2 1 3 3 2   Down, Depressed, Hopeless 1 1 3 3 3   PHQ - 2 Score 3 2 6 6 5   Altered sleeping 2 0 3 3 3   Tired, decreased energy 3 0 3 3 3   Change in appetite 3 3 3 3 3   Feeling bad or failure about yourself  2 2 3 3 3   Trouble concentrating 1 0 3 3 3   Moving slowly or fidgety/restless 0 0 2 0 0  Suicidal thoughts 1 0 3 1 1   PHQ-9 Score 15 7 26 22 21   Difficult doing work/chores Very difficult Not difficult at all Extremely dIfficult Very difficult -   GAD 7 : Generalized Anxiety Score 12/11/2019 06/06/2019 05/23/2019 12/19/2018  Nervous, Anxious, on Edge 3 1 2 3   Control/stop worrying 3 1 2 3   Worry too much - different  things 3 1 2 3   Trouble relaxing 3 1 3 3   Restless 3 0 3 3  Easily annoyed or irritable 3 0 1 1  Afraid - awful might happen 3 1 2 3   Total GAD 7 Score 21 5 15 19   Anxiety Difficulty Very difficult Not difficult at all Extremely difficult Extremely difficult   Anhedonia: no Weight changes: no Insomnia: no   Hypersomnia: no Fatigue/loss of energy: yes Feelings of worthlessness: yes Feelings of guilt: yes Impaired concentration/indecisiveness: no Suicidal ideations: no  Crying spells: no Recent Stressors/Life Changes: yes   Relationship problems: no   Family stress: no     Financial stress: no    Job stress: no    Recent death/loss: no   Relevant past medical, surgical, family and social history reviewed and updated as indicated. Interim medical history since our last visit reviewed. Allergies and medications reviewed and updated.  Review of Systems  Constitutional: Negative.   Respiratory: Negative.   Cardiovascular: Positive for leg swelling. Negative for chest pain and palpitations.  Gastrointestinal: Negative.   Musculoskeletal: Negative.   Skin: Negative.   Psychiatric/Behavioral: Negative.     Per HPI unless specifically indicated above     Objective:    BP 100/64   Pulse 80   Temp 98.5 F (36.9 C)   SpO2 97%   Wt Readings from Last 3 Encounters:  11/15/19  274 lb (124.3 kg)  10/25/19 279 lb 1.6 oz (126.6 kg)  09/11/19 282 lb (127.9 kg)    Physical Exam Vitals and nursing note reviewed.  Constitutional:      General: She is not in acute distress.    Appearance: Normal appearance. She is not ill-appearing, toxic-appearing or diaphoretic.  HENT:     Head: Normocephalic and atraumatic.     Right Ear: External ear normal.     Left Ear: External ear normal.     Nose: Nose normal.     Mouth/Throat:     Mouth: Mucous membranes are moist.     Pharynx: Oropharynx is clear.  Eyes:     General: No scleral icterus.       Right eye: No discharge.         Left eye: No discharge.     Extraocular Movements: Extraocular movements intact.     Conjunctiva/sclera: Conjunctivae normal.     Pupils: Pupils are equal, round, and reactive to light.  Cardiovascular:     Rate and Rhythm: Normal rate and regular rhythm.     Pulses: Normal pulses.     Heart sounds: Normal heart sounds. No murmur. No friction rub. No gallop.   Pulmonary:     Effort: Pulmonary effort is normal. No respiratory distress.     Breath sounds: Normal breath sounds. No stridor. No wheezing, rhonchi or rales.  Chest:     Chest wall: No tenderness.  Musculoskeletal:        General: Normal range of motion.     Cervical back: Normal range of motion and neck supple.     Comments: Extra tissue on her legs, no pitting edema  Skin:    General: Skin is warm and dry.     Capillary Refill: Capillary refill takes less than 2 seconds.     Coloration: Skin is not jaundiced or pale.     Findings: No bruising, erythema, lesion or rash.  Neurological:     General: No focal deficit present.     Mental Status: She is alert and oriented to person, place, and time. Mental status is at baseline.  Psychiatric:        Mood and Affect: Mood normal.        Behavior: Behavior normal.        Thought Content: Thought content normal.        Judgment: Judgment normal.     Results for orders placed or performed in visit on 11/02/19  Bayer DCA Hb A1c Waived  Result Value Ref Range   HB A1C (BAYER DCA - WAIVED) 5.0 <7.0 %  CBC with Differential/Platelet  Result Value Ref Range   WBC 11.0 (H) 3.4 - 10.8 x10E3/uL   RBC 4.56 3.77 - 5.28 x10E6/uL   Hemoglobin 13.8 11.1 - 15.9 g/dL   Hematocrit 12.4 58.0 - 46.6 %   MCV 90 79 - 97 fL   MCH 30.3 26.6 - 33.0 pg   MCHC 33.5 31.5 - 35.7 g/dL   RDW 99.8 33.8 - 25.0 %   Platelets 275 150 - 450 x10E3/uL   Neutrophils 71 Not Estab. %   Lymphs 18 Not Estab. %   Monocytes 6 Not Estab. %   Eos 3 Not Estab. %   Basos 1 Not Estab. %   Neutrophils Absolute  7.9 (H) 1.4 - 7.0 x10E3/uL   Lymphocytes Absolute 1.9 0.7 - 3.1 x10E3/uL   Monocytes Absolute 0.7 0.1 - 0.9 x10E3/uL   EOS (  ABSOLUTE) 0.4 0.0 - 0.4 x10E3/uL   Basophils Absolute 0.1 0.0 - 0.2 x10E3/uL   Immature Granulocytes 1 Not Estab. %   Immature Grans (Abs) 0.1 0.0 - 0.1 x10E3/uL  Comprehensive metabolic panel  Result Value Ref Range   Glucose 81 65 - 99 mg/dL   BUN 25 (H) 6 - 20 mg/dL   Creatinine, Ser 2.42 0.57 - 1.00 mg/dL   GFR calc non Af Amer 101 >59 mL/min/1.73   GFR calc Af Amer 116 >59 mL/min/1.73   BUN/Creatinine Ratio 33 (H) 9 - 23   Sodium 144 134 - 144 mmol/L   Potassium 4.5 3.5 - 5.2 mmol/L   Chloride 106 96 - 106 mmol/L   CO2 20 20 - 29 mmol/L   Calcium 9.5 8.7 - 10.2 mg/dL   Total Protein 6.3 6.0 - 8.5 g/dL   Albumin 4.4 3.8 - 4.8 g/dL   Globulin, Total 1.9 1.5 - 4.5 g/dL   Albumin/Globulin Ratio 2.3 (H) 1.2 - 2.2   Bilirubin Total 0.2 0.0 - 1.2 mg/dL   Alkaline Phosphatase 83 39 - 117 IU/L   AST 21 0 - 40 IU/L   ALT 21 0 - 32 IU/L  VITAMIN D 25 Hydroxy (Vit-D Deficiency, Fractures)  Result Value Ref Range   Vit D, 25-Hydroxy 15.7 (L) 30.0 - 100.0 ng/mL  Lipid Panel w/o Chol/HDL Ratio  Result Value Ref Range   Cholesterol, Total 173 100 - 199 mg/dL   Triglycerides 90 0 - 149 mg/dL   HDL 57 >68 mg/dL   VLDL Cholesterol Cal 17 5 - 40 mg/dL   LDL Chol Calc (NIH) 99 0 - 99 mg/dL  Vitamin A  Result Value Ref Range   Vitamin A 38.8 18.9 - 57.3 ug/dL  Vitamin B1  Result Value Ref Range   Thiamine 110.4 66.5 - 200.0 nmol/L  PTH, Intact and Calcium  Result Value Ref Range   PTH 38 15 - 65 pg/mL   PTH Interp Comment   Zinc  Result Value Ref Range   Zinc 59 44 - 115 ug/dL  Gliadin antibodies, serum  Result Value Ref Range   Antigliadin Abs, IgA 5 0 - 19 units   Gliadin IgG 4 0 - 19 units  Tissue transglutaminase, IgA  Result Value Ref Range   Transglutaminase IgA <2 0 - 3 U/mL  Reticulin Antibody, IgA w reflex titer  Result Value Ref Range    Reticulin Ab, IgA Negative Neg:<1:2.5 titer  B Nat Peptide  Result Value Ref Range   BNP 35.5 0.0 - 100.0 pg/mL      Assessment & Plan:   Problem List Items Addressed This Visit      Musculoskeletal and Integument   Cervical strain    Not improving. We will check x-rays and get her into PT. Recheck in about 1 month. Call with any concerns.       Relevant Medications   cyclobenzaprine (FLEXERIL) 10 MG tablet   Other Relevant Orders   DG Cervical Spine Complete (Completed)   DG Shoulder Right (Completed)   Ambulatory referral to Physical Therapy     Other   Generalized anxiety disorder    Under fair control on current regimen. Continue current regimen. Continue to monitor. Call with any concerns. Refills given. Follow up as scheduled.         Other Visit Diagnoses    Peripheral edema    -  Primary   Labs normal. Discussed compression stockings and offered referral to vascular.  Continue to monitor. Call with any cocnerns.    Anxiety and depression       Relevant Medications   clonazePAM (KLONOPIN) 0.5 MG tablet (Start on 01/01/2020)       Follow up plan: Return As scheduled.

## 2019-12-12 ENCOUNTER — Encounter: Payer: Self-pay | Admitting: Family Medicine

## 2019-12-13 NOTE — Assessment & Plan Note (Signed)
Under fair control on current regimen. Continue current regimen. Continue to monitor. Call with any concerns. Refills given. Follow up as scheduled.

## 2019-12-13 NOTE — Assessment & Plan Note (Signed)
Not improving. We will check x-rays and get her into PT. Recheck in about 1 month. Call with any concerns.

## 2019-12-14 ENCOUNTER — Encounter: Payer: Self-pay | Admitting: Physical Therapy

## 2019-12-14 ENCOUNTER — Other Ambulatory Visit: Payer: Self-pay

## 2019-12-14 ENCOUNTER — Ambulatory Visit: Payer: BC Managed Care – PPO | Attending: Family Medicine | Admitting: Physical Therapy

## 2019-12-14 DIAGNOSIS — M542 Cervicalgia: Secondary | ICD-10-CM | POA: Diagnosis not present

## 2019-12-14 DIAGNOSIS — M6281 Muscle weakness (generalized): Secondary | ICD-10-CM

## 2019-12-14 DIAGNOSIS — R293 Abnormal posture: Secondary | ICD-10-CM

## 2019-12-14 NOTE — Therapy (Signed)
[name] [address] [place], [place], [address] Phone: [phone]   Fax:  [phone]  Physical Therapy Evaluation  Patient Details  Name: Sharon Gibson MRN: [id number] Date of Birth: 1981/06/23 Referring Provider (PT): Park Liter   Encounter Date: 12/14/2019  PT End of Session - 12/14/19 1108    Visit Number  1    Number of Visits  7    Date for PT Re-Evaluation  01/25/20    PT Start Time  0810    PT Stop Time  0910    PT Time Calculation (min)  60 min    Activity Tolerance  Patient tolerated treatment well;Patient limited by pain    Behavior During Therapy  [facility] for tasks assessed/performed       Past Medical History:  Diagnosis Date  . Anxiety   . Chicken pox   . Depression   . Eating disorder    Has had residential treatment and hospitalization previously.  Marland Kitchen Heart murmur    as a child  . History of anemia    RECIEVES IRON INFUSIONS YEARLY  . History of pulmonary embolus (PE) 2002   bilaterally-followed by [facility] hematology-w/u negative per pt  . Hypothyroidism    no meds currently  . PTSD (post-traumatic stress disorder)     Past Surgical History:  Procedure Laterality Date  . CHOLECYSTECTOMY  2003  . EXCISION OF ENDOMETRIOMA    . GASTRIC BYPASS  2003  . LAPAROSCOPIC OVARIAN CYSTECTOMY Left 05/10/2018   Procedure: LAPAROSCOPIC OVARIAN CYSTECTOMY;  Surgeon: Homero Fellers, MD;  Location: [facility] ORS;  Service: Gynecology;  Laterality: Left;    There were no vitals filed for this visit.       [facility] PT Assessment - 12/14/19 0001      Assessment   Medical Diagnosis  strain of neck muscle    Referring Provider (PT)  Wynetta Emery, Megan    Hand Dominance  Right    Prior Therapy  Yes; LBP      Balance Screen   Has the patient fallen in the past 6 months  No       SUBJECTIVE  Chief complaint:  Patient notes that if she does repeated head turns (during driving) her pain worsens to the point  that she is in tears/on the verge of tears (10/10). Patient also notes that with increased sitting at desk worsens pain. Pain is at the R side of the neck and inside both shoulder blades (constant, intensely dull pain). Patient also notes pain originally started above the clavicle on the R.   Per MD: right shoulder/ neck/ clavicle region pain which started mildly last week but worsening over the past few days. She had started a new work out routine, and feels that the "twisting side planks" likely have contributed. She does work at a computer as well, and is right handed. Pain is constant, 6/10, but worse with certain movements. Wasn't necessarily worse when she woke up, but with activity has increased. No numbness tingling or weakness to right arm.  Onset: November 13 2019  Pain: 5/10 Present, 4-5/10 Best, 10/10 Worst  Easing factors: massage, flexeril, foam rolling,  24 hour pain behavior: AM is typically better, PM is typically worse Recent neck trauma: No Prior history of neck injury or pain: No Pain quality: pain quality: dull and constant, intense Radiating pain: Yes (above the elbow, intermittent) Numbness/Tingling: No Follow-up appointment with MD: No Dominant hand: right Imaging: Yes ; no indication  of joint changes    OBJECTIVE  Mental Status Patient is oriented to person, place and time.  Recent memory is intact.  Remote memory is intact.  Attention span and concentration are intact.  Expressive speech is intact.  Patient's fund of knowledge is within normal limits for educational level.  SENSATION: Grossly intact to light touch bilateral UE as determined by testing dermatomes C2-T2 Proprioception and hot/cold testing deferred on this date   MUSCULOSKELETAL: Tremor: None Bulk: Normal Tone: Normal  Posture Forward head, rounded shoulders. Prop sitting with increased extension at thoracolumbar jxn.   Palpation TTP throughout periscapular and cervical region on R. No  noted tenderness on L. Patient has concordant pain with palpation of first rib on R.   Strength Deferred this visit 2/2 to high irritability.  AROM R/L 49 Cervical Flexion 57 Cervical Extension 38/37* Cervical Lateral Flexion 64/58* Cervical Rotation Shoulder motion, actively hypermobile in all directions; no noted pain. *Indicates pain, overpressure performed unless otherwise indicated  Repeated Movements No centralization or peripheralization of symptoms with repeated cervical protraction and retraction. Cervical retraction mildly relieving.    Passive Accessory Intervertebral Motion (PAIVM) Reproduction of pain with CPA C2-T7. Generally hypermobile throughout.  R first rib mildly elevated in comparison with L; increased pain with repetitions.  SPECIAL TESTS Distraction Test: Positive Shoulder Sulcus Sign: R: Positive L: Positive    ASSESSMENT Patient is a 39 year old presenting to clinic with chief complaints of highly irritability R sided neck/shoulder/first rib pain with movement. Upon examination, patient demonstrates deficits in posture, cervical and shoulder mobility, strength, pain, and muscular endurance as evidenced by limitations in cervical rotation bilaterally (L > R), hypermobility of B glenohumeral joints, inability to tolerate resisted cervical isometrics, and worst pain 10/10. Patient's responses on FOTO outcome measures (48) indicate moderate functional limitations/disability/distress. Patient's progress may be limited due to high irritability and work demands; however, patient's motivation and existing health behaviors are advantageous. Patient was able to achieve mild relief with cervical distraction and cervical retraction during today's evaluation and responded positively to educational interventions. Patient will benefit from continued skilled therapeutic intervention to address deficits in posture, cervical and shoulder mobility, strength, pain, and muscular  endurance in order to increase function and improve overall QOL.   Patient educated on prognosis, POC, and provided with HEP including: thoracic extension, chest opening stretch, cervical CARs. Patient articulated understanding and returned demonstration. Patient will benefit from further education in order to maximize compliance and understanding for long-term therapeutic gains.      TREATMENT  Neuromuscular Re-education: Cervical retraction for improved pain and posture Cervical distraction for improved pain and posture Patient education on sleeping postures and desk postures for decreased irritation and improved postural endurance.     Objective measurements completed on examination: See above findings.     PT Long Term Goals - 12/14/19 1147      PT LONG TERM GOAL #1   Title  Patient will be independent with HEP in order to improve strength and decrease back pain in order to improve pain-free function at home and work.    Baseline  IE: not initiated    Time  6    Period  Weeks    Status  New    Target Date  01/25/20      PT LONG TERM GOAL #2   Title  Patient will decrease worst neck pain as reported on NPRS by at least 2 points in order to demonstrate clinically significant reduction in back pain.  Baseline  IE: 10/10    Time  6    Period  Weeks    Status  New    Target Date  01/25/20      PT LONG TERM GOAL #3   Title  Patient will demonstrate improved function as evidenced by a score of 66 on FOTO measure for full participation in activities at home and in the community.    Baseline  IE: 48    Time  6    Period  Weeks    Status  New    Target Date  01/25/20      PT LONG TERM GOAL #4   Title  Patient will demonstrate improved functional strength as evidenced by ability to perform side plank from knees without shoulder/neck pain exacerbation for participation in personal wellness activities.    Baseline  IE: unable    Time  6    Period  Weeks    Status  New     Target Date  01/25/20      PT LONG TERM GOAL #5   Title  Patient will demonstrate improved cervical rotation B as demonstrated by AROM greater than/equal to 70 degrees bilaterally with pain no greater than 4/10 for improved function/safety with driving.    Baseline  IE: R 64, L 58 (7/10)    Time  6    Period  Weeks    Status  New    Target Date  01/25/20             Plan - 12/14/19 0804    Clinical Impression Statement  Patient is a 39 year old presenting to clinic with chief complaints of highly irritability R sided neck/shoulder/first rib pain with movement. Upon examination, patient demonstrates deficits in posture, cervical and shoulder mobility, strength, pain, and muscular endurance as evidenced by limitations in cervical rotation bilaterally (L > R), hypermobility of B glenohumeral joints, inability to tolerate resisted cervical isometrics, and worst pain 10/10. Patient's responses on FOTO outcome measures (48) indicate moderate functional limitations/disability/distress. Patient's progress may be limited due to high irritability and work demands; however, patient's motivation and existing health behaviors are advantageous. Patient was able to achieve mild relief with cervical distraction and cervical retraction during today's evaluation and responded positively to educational interventions. Patient will benefit from continued skilled therapeutic intervention to address deficits in posture, cervical and shoulder mobility, strength, pain, and muscular endurance in order to increase function and improve overall QOL.    Personal Factors and Comorbidities  Comorbidity 3+;Age;Fitness;Past/Current Experience;Behavior Pattern;Time since onset of injury/illness/exacerbation    Comorbidities  insomnia, bulimia nervosa, anxiety, depression, PTSD    Examination-Activity Limitations  Bed Mobility;Sleep;Dressing;Lift;Carry;Reach Overhead    Examination-Participation Restrictions  Cleaning;Meal  Prep;Shop;Yard Work;Laundry;Community Activity;Driving;Personal Finances    Stability/Clinical Decision Making  Evolving/Moderate complexity    Clinical Decision Making  Moderate    Rehab Potential  Good    PT Frequency  1x / week    PT Duration  6 weeks    PT Treatment/Interventions  ADLs/Self Care Home Management;Cryotherapy;Electrical Stimulation;Moist Heat;Neuromuscular re-education;Balance training;Therapeutic exercise;Therapeutic activities;Functional mobility training;Stair training;Gait training;Patient/family education;Manual techniques;Passive range of motion;Dry needling;Joint Manipulations;Spinal Manipulations;Taping    PT Next Visit Plan  Dry needling; postural re-education/spinal segmentation    PT Home Exercise Plan  thoracic extension, chest opening stretch, cervical CARs    Consulted and Agree with Plan of Care  Patient       Patient will benefit from skilled therapeutic intervention in order to improve the following  deficits and impairments:  Decreased range of motion, Increased muscle spasms, Impaired UE functional use, Decreased endurance, Decreased activity tolerance, Pain, Improper body mechanics, Impaired flexibility, Postural dysfunction, Decreased strength  Visit Diagnosis: Neck pain  Abnormal posture  Muscle weakness (generalized)     Problem List Patient Active Problem List   Diagnosis Date Noted  . Cervical strain 11/28/2019  . Other fatigue 04/16/2019  . Endometriosis determined by laparoscopy 06/13/2018  . Ovarian cyst 03/01/2018  . Hx pulmonary embolism 02/03/2018  . Generalized anxiety disorder 10/12/2016  . Right knee pain 09/02/2016  . Severe recurrent major depression without psychotic features (HCC) 07/23/2016  . Bulimia nervosa, in partial remission, mild 07/23/2016    Class: Chronic  . Iron deficiency anemia 04/08/2016  . B12 deficiency 04/08/2016  . S/P gastric bypass 03/20/2016  . Eating disorder 03/20/2016  . Insomnia 03/20/2016  .  PTSD (post-traumatic stress disorder) 03/20/2016  . Hypothyroidism 03/20/2016  . PCOS (polycystic ovarian syndrome) 12/14/2014   Sheria Lang PT, DPT [id number] 12/14/2019, 11:56 AM  [facility] [address] [place], [place], [address] Phone: [phone]   Fax:  [phone]  Name: Sharon Gibson MRN: [id number] Date of Birth: [birth year]

## 2019-12-20 ENCOUNTER — Other Ambulatory Visit: Payer: Self-pay

## 2019-12-20 ENCOUNTER — Ambulatory Visit: Payer: BC Managed Care – PPO | Admitting: Physical Therapy

## 2019-12-20 ENCOUNTER — Encounter: Payer: Self-pay | Admitting: Physical Therapy

## 2019-12-20 DIAGNOSIS — R293 Abnormal posture: Secondary | ICD-10-CM | POA: Diagnosis not present

## 2019-12-20 DIAGNOSIS — M542 Cervicalgia: Secondary | ICD-10-CM

## 2019-12-20 DIAGNOSIS — M6281 Muscle weakness (generalized): Secondary | ICD-10-CM | POA: Diagnosis not present

## 2019-12-20 NOTE — Therapy (Signed)
[facility] [address] [place], [place], [address] Phone: [phone]   Fax:  [phone]  Physical Therapy Treatment  Patient Details  Name: Sharon Gibson MRN: [id number] Date of Birth: 1981-03-11 Referring Provider (PT): Olevia Perches   Encounter Date: 12/20/2019  PT End of Session - 12/20/19 0757    Visit Number  2    Number of Visits  7    Date for PT Re-Evaluation  01/25/20    PT Start Time  0758    PT Stop Time  0855    PT Time Calculation (min)  57 min    Activity Tolerance  Patient tolerated treatment well;Patient limited by pain    Behavior During Therapy  [facility] for tasks assessed/performed       Past Medical History:  Diagnosis Date  . Anxiety   . Chicken pox   . Depression   . Eating disorder    Has had residential treatment and hospitalization previously.  Marland Kitchen Heart murmur    as a child  . History of anemia    RECIEVES IRON INFUSIONS YEARLY  . History of pulmonary embolus (PE) 2002   bilaterally-followed by [facility] hematology-w/u negative per pt  . Hypothyroidism    no meds currently  . PTSD (post-traumatic stress disorder)     Past Surgical History:  Procedure Laterality Date  . CHOLECYSTECTOMY  2003  . EXCISION OF ENDOMETRIOMA    . GASTRIC BYPASS  2003  . LAPAROSCOPIC OVARIAN CYSTECTOMY Left 05/10/2018   Procedure: LAPAROSCOPIC OVARIAN CYSTECTOMY;  Surgeon: Natale Milch, MD;  Location: [facility] ORS;  Service: Gynecology;  Laterality: Left;    There were no vitals filed for this visit.  Subjective Assessment - 12/20/19 1034    Subjective  Patient reports that her pain was irritated after evaluation and she potentially did participated in some activities over the weekend that also contributed to irritability. Patient notes that she has become aware of how frequently she is having to use her neck during her day (dual monitors at work; driving; etc.) and is noticing how much that impacts her pain.  She did purchase and attempt to use a postural brace but has not found it tolerable or useful to date.    Currently in Pain?  Yes    Pain Score  4       TREATMENT  Manual Therapy: STM and TPR performed to thoracic and cervical mm to allow for decreased tension and pain and improved posture and function using vibratory and percussive instrument Cervical Mobilizations with rotational movement for improved mobility, grade II/III Cervical extension CPAs, grade II/III for improved joint mobility Suboccipital release with manual distraction, multiple bouts, for decreased pain and spasm Thoracic CPA/UPA, grade II/III for improved joint mobility  Neuromuscular Re-education: Scapular controlled articular rotations, for improved joint mobility and proprioception, 10-20 % MVC Patient education on healing phases and focus with present focus being on decreasing pain and gently increasing mobility.   Patient educated throughout session on appropriate technique and form using multi-modal cueing, HEP, and activity modification. Patient articulated understanding and returned demonstration.  Patient Response to interventions: 0/10 pain  ASSESSMENT Patient presents to clinic with excellent motivation to participate in therapy. Patient demonstrates deficits in posture, cervical and shoulder mobility, strength, pain, and muscular endurance. Patient able to achieve 0/10 pain after manual interventions during today's session and responded positively to manual and educational interventions. Patient will benefit from continued skilled therapeutic intervention to address remaining deficits in  posture, cervical and shoulder mobility, strength, pain, and muscular endurance in order to increase function and improve overall QOL.    PT Long Term Goals - 12/14/19 1147      PT LONG TERM GOAL #1   Title  Patient will be independent with HEP in order to improve strength and decrease back pain in order to improve  pain-free function at home and work.    Baseline  IE: not initiated    Time  6    Period  Weeks    Status  New    Target Date  01/25/20      PT LONG TERM GOAL #2   Title  Patient will decrease worst neck pain as reported on NPRS by at least 2 points in order to demonstrate clinically significant reduction in back pain.    Baseline  IE: 10/10    Time  6    Period  Weeks    Status  New    Target Date  01/25/20      PT LONG TERM GOAL #3   Title  Patient will demonstrate improved function as evidenced by a score of 66 on FOTO measure for full participation in activities at home and in the community.    Baseline  IE: 48    Time  6    Period  Weeks    Status  New    Target Date  01/25/20      PT LONG TERM GOAL #4   Title  Patient will demonstrate improved functional strength as evidenced by ability to perform side plank from knees without shoulder/neck pain exacerbation for participation in personal wellness activities.    Baseline  IE: unable    Time  6    Period  Weeks    Status  New    Target Date  01/25/20      PT LONG TERM GOAL #5   Title  Patient will demonstrate improved cervical rotation B as demonstrated by AROM greater than/equal to 70 degrees bilaterally with pain no greater than 4/10 for improved function/safety with driving.    Baseline  IE: R 64, L 58 (7/10)    Time  6    Period  Weeks    Status  New    Target Date  01/25/20            Plan - 12/20/19 0757    Clinical Impression Statement  Patient presents to clinic with excellent motivation to participate in therapy. Patient demonstrates deficits in posture, cervical and shoulder mobility, strength, pain, and muscular endurance. Patient able to achieve 0/10 pain after manual interventions during today's session and responded positively to manual and educational interventions. Patient will benefit from continued skilled therapeutic intervention to address remaining deficits in posture, cervical and shoulder  mobility, strength, pain, and muscular endurance in order to increase function and improve overall QOL.    Personal Factors and Comorbidities  Comorbidity 3+;Age;Fitness;Past/Current Experience;Behavior Pattern;Time since onset of injury/illness/exacerbation    Comorbidities  insomnia, bulimia nervosa, anxiety, depression, PTSD    Examination-Activity Limitations  Bed Mobility;Sleep;Dressing;Lift;Carry;Reach Overhead    Examination-Participation Restrictions  Cleaning;Meal Prep;Shop;Yard Work;Laundry;Community Activity;Driving;Personal Finances    Stability/Clinical Decision Making  Evolving/Moderate complexity    Rehab Potential  Good    PT Frequency  1x / week    PT Duration  6 weeks    PT Treatment/Interventions  ADLs/Self Care Home Management;Cryotherapy;Electrical Stimulation;Moist Heat;Neuromuscular re-education;Balance training;Therapeutic exercise;Therapeutic activities;Functional mobility training;Stair training;Gait training;Patient/family education;Manual techniques;Passive range of motion;Dry  needling;Joint Manipulations;Spinal Manipulations;Taping    PT Next Visit Plan  Dry needling; postural re-education/spinal segmentation    PT Home Exercise Plan  thoracic extension, chest opening stretch, cervical CARs    Consulted and Agree with Plan of Care  Patient       Patient will benefit from skilled therapeutic intervention in order to improve the following deficits and impairments:  Decreased range of motion, Increased muscle spasms, Impaired UE functional use, Decreased endurance, Decreased activity tolerance, Pain, Improper body mechanics, Impaired flexibility, Postural dysfunction, Decreased strength  Visit Diagnosis: Neck pain  Abnormal posture  Muscle weakness (generalized)     Problem List Patient Active Problem List   Diagnosis Date Noted  . Cervical strain 11/28/2019  . Other fatigue 04/16/2019  . Endometriosis determined by laparoscopy 06/13/2018  . Ovarian cyst  03/01/2018  . Hx pulmonary embolism 02/03/2018  . Generalized anxiety disorder 10/12/2016  . Right knee pain 09/02/2016  . Severe recurrent major depression without psychotic features (HCC) 07/23/2016  . Bulimia nervosa, in partial remission, mild 07/23/2016    Class: Chronic  . Iron deficiency anemia 04/08/2016  . B12 deficiency 04/08/2016  . S/P gastric bypass 03/20/2016  . Eating disorder 03/20/2016  . Insomnia 03/20/2016  . PTSD (post-traumatic stress disorder) 03/20/2016  . Hypothyroidism 03/20/2016  . PCOS (polycystic ovarian syndrome) 12/14/2014   Sheria Lang PT, DPT [phone] 12/20/2019, 10:46 AM  [facility] [address] [place], [place], [address] Phone: [phone]   Fax:  [phone]  Name: Sharon Gibson MRN: [id number] Date of Birth: 1981-09-09

## 2019-12-27 ENCOUNTER — Ambulatory Visit: Payer: BC Managed Care – PPO | Admitting: Physical Therapy

## 2019-12-27 ENCOUNTER — Encounter: Payer: Self-pay | Admitting: Physical Therapy

## 2019-12-27 ENCOUNTER — Other Ambulatory Visit: Payer: Self-pay

## 2019-12-27 DIAGNOSIS — M542 Cervicalgia: Secondary | ICD-10-CM

## 2019-12-27 DIAGNOSIS — R293 Abnormal posture: Secondary | ICD-10-CM

## 2019-12-27 DIAGNOSIS — M6281 Muscle weakness (generalized): Secondary | ICD-10-CM

## 2019-12-27 NOTE — Therapy (Signed)
[place] [address] [place], [place], [address] Phone: [phone]   Fax:  [phone]  Physical Therapy Treatment  Patient Details  Name: Sharon Gibson MRN: [id number] Date of Birth: 20-Aug-1981 Referring Provider (PT): Olevia Perches   Encounter Date: 12/27/2019  PT End of Session - 12/27/19 0759    Visit Number  3    Number of Visits  7    Date for PT Re-Evaluation  01/25/20    PT Start Time  0800    PT Stop Time  0915    PT Time Calculation (min)  75 min    Activity Tolerance  Patient tolerated treatment well    Behavior During Therapy  [facility] for tasks assessed/performed       Past Medical History:  Diagnosis Date  . Anxiety   . Chicken pox   . Depression   . Eating disorder    Has had residential treatment and hospitalization previously.  Marland Kitchen Heart murmur    as a child  . History of anemia    RECIEVES IRON INFUSIONS YEARLY  . History of pulmonary embolus (PE) 2002   bilaterally-followed by [facility] hematology-w/u negative per pt  . Hypothyroidism    no meds currently  . PTSD (post-traumatic stress disorder)     Past Surgical History:  Procedure Laterality Date  . CHOLECYSTECTOMY  2003  . EXCISION OF ENDOMETRIOMA    . GASTRIC BYPASS  2003  . LAPAROSCOPIC OVARIAN CYSTECTOMY Left 05/10/2018   Procedure: LAPAROSCOPIC OVARIAN CYSTECTOMY;  Surgeon: Natale Milch, MD;  Location: [facility] ORS;  Service: Gynecology;  Laterality: Left;    There were no vitals filed for this visit.  Subjective Assessment - 12/27/19 0801    Subjective  Patient had second dose of vaccination yesterday. Patient feels like her neck is less angry today. Patient notes that she got a 90 minute massage on Friday but the weekend was terrible with soreness. Patient notes she felt great after the last PT session but the pain relief only lasted until she got to work.    Currently in Pain?  Yes    Pain Score  3     Pain Location  Chest     Pain Orientation  Right;Upper;Lateral    Pain Descriptors / Indicators  Sore       TREATMENT  Manual Therapy: STM and TPR performed to thoracic and cervical mm to allow for decreased tension and pain and improved posture and function using vibratory and percussive instrument Cervical Mobilizations with rotational movement for improved mobility, grade II/III Cervical extension CPAs, grade II/III for improved joint mobility Suboccipital release with manual distraction, multiple bouts, for decreased pain and spasm Upper Thoracic R UPA, grade II/III for improved joint mobility with rotation  Neuromuscular Re-education: Patient education on workplace ergonomics and body mechanics for decreased irritability of pain.  Patient education on modifications for present wellness activities including: Pilates exercises and mobility exercises. R open book for improved R thoracic rotation and decreased pain.   Patient educated throughout session on appropriate technique and form using multi-modal cueing, HEP, and activity modification. Patient articulated understanding and returned demonstration.  Patient Response to interventions: Patient amenable to apply modifications to wellness activities.  ASSESSMENT Patient presents to clinic with excellent motivation to participate in therapy. Patient demonstrates deficits in posture, cervical and shoulder mobility, strength, pain, and muscular endurance. Patient with apparent L thoracic rotation at rest with concordant neck pain during R thoracic rotation during today's session  and responded positively to manual and educational interventions. Patient will benefit from continued skilled therapeutic intervention to address remaining deficits in posture, cervical and shoulder mobility, strength, pain, and muscular endurance in order to increase function and improve overall QOL.   PT Long Term Goals - 12/14/19 1147      PT LONG TERM GOAL #1   Title  Patient will  be independent with HEP in order to improve strength and decrease back pain in order to improve pain-free function at home and work.    Baseline  IE: not initiated    Time  6    Period  Weeks    Status  New    Target Date  01/25/20      PT LONG TERM GOAL #2   Title  Patient will decrease worst neck pain as reported on NPRS by at least 2 points in order to demonstrate clinically significant reduction in back pain.    Baseline  IE: 10/10    Time  6    Period  Weeks    Status  New    Target Date  01/25/20      PT LONG TERM GOAL #3   Title  Patient will demonstrate improved function as evidenced by a score of 66 on FOTO measure for full participation in activities at home and in the community.    Baseline  IE: 48    Time  6    Period  Weeks    Status  New    Target Date  01/25/20      PT LONG TERM GOAL #4   Title  Patient will demonstrate improved functional strength as evidenced by ability to perform side plank from knees without shoulder/neck pain exacerbation for participation in personal wellness activities.    Baseline  IE: unable    Time  6    Period  Weeks    Status  New    Target Date  01/25/20      PT LONG TERM GOAL #5   Title  Patient will demonstrate improved cervical rotation B as demonstrated by AROM greater than/equal to 70 degrees bilaterally with pain no greater than 4/10 for improved function/safety with driving.    Baseline  IE: R 64, L 58 (7/10)    Time  6    Period  Weeks    Status  New    Target Date  01/25/20            Plan - 12/27/19 0759    Clinical Impression Statement  Patient presents to clinic with excellent motivation to participate in therapy. Patient demonstrates deficits in posture, cervical and shoulder mobility, strength, pain, and muscular endurance. Patient with apparent L thoracic rotation at rest with concordant neck pain during R thoracic rotation during today's session and responded positively to manual and educational interventions.  Patient will benefit from continued skilled therapeutic intervention to address remaining deficits in posture, cervical and shoulder mobility, strength, pain, and muscular endurance in order to increase function and improve overall QOL.    Personal Factors and Comorbidities  Comorbidity 3+;Age;Fitness;Past/Current Experience;Behavior Pattern;Time since onset of injury/illness/exacerbation    Comorbidities  insomnia, bulimia nervosa, anxiety, depression, PTSD    Examination-Activity Limitations  Bed Mobility;Sleep;Dressing;Lift;Carry;Reach Overhead    Examination-Participation Restrictions  Cleaning;Meal Prep;Shop;Yard Work;Laundry;Community Activity;Driving;Personal Finances    Stability/Clinical Decision Making  Evolving/Moderate complexity    Rehab Potential  Good    PT Frequency  1x / week    PT Duration  6 weeks    PT Treatment/Interventions  ADLs/Self Care Home Management;Cryotherapy;Electrical Stimulation;Moist Heat;Neuromuscular re-education;Balance training;Therapeutic exercise;Therapeutic activities;Functional mobility training;Stair training;Gait training;Patient/family education;Manual techniques;Passive range of motion;Dry needling;Joint Manipulations;Spinal Manipulations;Taping    PT Next Visit Plan  Dry needling; postural re-education/spinal segmentation    PT Home Exercise Plan  thoracic extension, chest opening stretch, cervical CARs    Consulted and Agree with Plan of Care  Patient       Patient will benefit from skilled therapeutic intervention in order to improve the following deficits and impairments:  Decreased range of motion, Increased muscle spasms, Impaired UE functional use, Decreased endurance, Decreased activity tolerance, Pain, Improper body mechanics, Impaired flexibility, Postural dysfunction, Decreased strength  Visit Diagnosis: Neck pain  Abnormal posture  Muscle weakness (generalized)     Problem List Patient Active Problem List   Diagnosis Date Noted   . Cervical strain 11/28/2019  . Other fatigue 04/16/2019  . Endometriosis determined by laparoscopy 06/13/2018  . Ovarian cyst 03/01/2018  . Hx pulmonary embolism 02/03/2018  . Generalized anxiety disorder 10/12/2016  . Right knee pain 09/02/2016  . Severe recurrent major depression without psychotic features (HCC) 07/23/2016  . Bulimia nervosa, in partial remission, mild 07/23/2016    Class: Chronic  . Iron deficiency anemia 04/08/2016  . B12 deficiency 04/08/2016  . S/P gastric bypass 03/20/2016  . Eating disorder 03/20/2016  . Insomnia 03/20/2016  . PTSD (post-traumatic stress disorder) 03/20/2016  . Hypothyroidism 03/20/2016  . PCOS (polycystic ovarian syndrome) 12/14/2014   Sheria Lang PT, DPT [phone] 12/27/2019, 9:27 AM  [place] [facility] [address] [place], [place], [address] Phone: [phone]   Fax:  [phone]  Name: Sharon Gibson MRN: [id number] Date of Birth: [birth year]

## 2019-12-28 ENCOUNTER — Inpatient Hospital Stay: Payer: BC Managed Care – PPO | Attending: Hematology and Oncology

## 2019-12-28 DIAGNOSIS — E538 Deficiency of other specified B group vitamins: Secondary | ICD-10-CM | POA: Insufficient documentation

## 2020-01-03 ENCOUNTER — Other Ambulatory Visit: Payer: Self-pay

## 2020-01-03 ENCOUNTER — Encounter: Payer: Self-pay | Admitting: Physical Therapy

## 2020-01-03 ENCOUNTER — Ambulatory Visit: Payer: BC Managed Care – PPO | Attending: Family Medicine | Admitting: Physical Therapy

## 2020-01-03 DIAGNOSIS — M542 Cervicalgia: Secondary | ICD-10-CM | POA: Diagnosis not present

## 2020-01-03 DIAGNOSIS — M6281 Muscle weakness (generalized): Secondary | ICD-10-CM | POA: Diagnosis not present

## 2020-01-03 DIAGNOSIS — R293 Abnormal posture: Secondary | ICD-10-CM | POA: Diagnosis not present

## 2020-01-03 NOTE — Therapy (Signed)
[facility] [address] [place], [place], [address] Phone: [phone]   Fax:  [phone]  Physical Therapy Treatment  Patient Details  Name: Sharon Gibson MRN: [id number] Date of Birth: 05/28/1981 Referring Provider (PT): Olevia Perches   Encounter Date: 01/03/2020  PT End of Session - 01/03/20 0953    Visit Number  4    Number of Visits  7    Date for PT Re-Evaluation  01/25/20    PT Start Time  0800    PT Stop Time  0855    PT Time Calculation (min)  55 min    Activity Tolerance  Patient tolerated treatment well    Behavior During Therapy  [facility] for tasks assessed/performed       Past Medical History:  Diagnosis Date  . Anxiety   . Chicken pox   . Depression   . Eating disorder    Has had residential treatment and hospitalization previously.  Marland Kitchen Heart murmur    as a child  . History of anemia    RECIEVES IRON INFUSIONS YEARLY  . History of pulmonary embolus (PE) 2002   bilaterally-followed by [facility] hematology-w/u negative per pt  . Hypothyroidism    no meds currently  . PTSD (post-traumatic stress disorder)     Past Surgical History:  Procedure Laterality Date  . CHOLECYSTECTOMY  2003  . EXCISION OF ENDOMETRIOMA    . GASTRIC BYPASS  2003  . LAPAROSCOPIC OVARIAN CYSTECTOMY Left 05/10/2018   Procedure: LAPAROSCOPIC OVARIAN CYSTECTOMY;  Surgeon: Natale Milch, MD;  Location: [facility] ORS;  Service: Gynecology;  Laterality: Left;    There were no vitals filed for this visit.  Subjective Assessment - 01/03/20 0801    Subjective  Patient reports that she had a great weekend without much pain. After working on Monday, she notes 5/10 pain. Patient notes this pain has been consistent since onset.    Currently in Pain?  Yes    Pain Score  5     Pain Location  Shoulder    Pain Orientation  Right;Medial;Posterior       TREATMENT  Manual Therapy: STM and TPR performed to thoracic and cervical mm to allow for  decreased tension and pain and improved posture and function using vibratory and percussive instrument Cervical Mobilizations with rotational movement for improved mobility, grade II/III Cervical extension CPAs, grade II/III for improved joint mobility Suboccipital release with manual distraction, multiple bouts, for decreased pain and spasm Upper Thoracic R UPA, grade II/III for improved joint mobility with rotation  Neuromuscular Re-education: R open book for improved R thoracic rotation and decreased pain R pinwheel arms for improved R thoracic extension and rotation and decreased pain Work Clinical biochemist with laptop education   Patient educated throughout session on appropriate technique and form using multi-modal cueing, HEP, and activity modification. Patient articulated understanding and returned demonstration.  Patient Response to interventions: Patient notes she will "pay for it later" but that she does feel looser.  ASSESSMENT Patient presents to clinic with excellent motivation to participate in therapy. Patient demonstrates deficits in posture, cervical and shoulder mobility, strength, pain, and muscular endurance. Patient with improved mobility of cervical joints with manual bilaterally during today's session and responded positively to manual and educational interventions. Patient will benefit from continued skilled therapeutic intervention to address remaining deficits in posture, cervical and shoulder mobility, strength, pain, and muscular endurance in order to increase function and improve overall QOL.    PT Long  Term Goals - 12/14/19 1147      PT LONG TERM GOAL #1   Title  Patient will be independent with HEP in order to improve strength and decrease back pain in order to improve pain-free function at home and work.    Baseline  IE: not initiated    Time  6    Period  Weeks    Status  New    Target Date  01/25/20      PT LONG TERM GOAL #2   Title  Patient will  decrease worst neck pain as reported on NPRS by at least 2 points in order to demonstrate clinically significant reduction in back pain.    Baseline  IE: 10/10    Time  6    Period  Weeks    Status  New    Target Date  01/25/20      PT LONG TERM GOAL #3   Title  Patient will demonstrate improved function as evidenced by a score of 66 on FOTO measure for full participation in activities at home and in the community.    Baseline  IE: 48    Time  6    Period  Weeks    Status  New    Target Date  01/25/20      PT LONG TERM GOAL #4   Title  Patient will demonstrate improved functional strength as evidenced by ability to perform side plank from knees without shoulder/neck pain exacerbation for participation in personal wellness activities.    Baseline  IE: unable    Time  6    Period  Weeks    Status  New    Target Date  01/25/20      PT LONG TERM GOAL #5   Title  Patient will demonstrate improved cervical rotation B as demonstrated by AROM greater than/equal to 70 degrees bilaterally with pain no greater than 4/10 for improved function/safety with driving.    Baseline  IE: R 64, L 58 (7/10)    Time  6    Period  Weeks    Status  New    Target Date  01/25/20            Plan - 01/03/20 0953    Clinical Impression Statement  Patient presents to clinic with excellent motivation to participate in therapy. Patient demonstrates deficits in posture, cervical and shoulder mobility, strength, pain, and muscular endurance. Patient with improved mobility of cervical joints with manual bilaterally during today's session and responded positively to manual and educational interventions. Patient will benefit from continued skilled therapeutic intervention to address remaining deficits in posture, cervical and shoulder mobility, strength, pain, and muscular endurance in order to increase function and improve overall QOL.    Personal Factors and Comorbidities  Comorbidity 3+;Age;Fitness;Past/Current  Experience;Behavior Pattern;Time since onset of injury/illness/exacerbation    Comorbidities  insomnia, bulimia nervosa, anxiety, depression, PTSD    Examination-Activity Limitations  Bed Mobility;Sleep;Dressing;Lift;Carry;Reach Overhead    Examination-Participation Restrictions  Cleaning;Meal Prep;Shop;Yard Work;Laundry;Community Activity;Driving;Personal Finances    Stability/Clinical Decision Making  Evolving/Moderate complexity    Rehab Potential  Good    PT Frequency  1x / week    PT Duration  6 weeks    PT Treatment/Interventions  ADLs/Self Care Home Management;Cryotherapy;Electrical Stimulation;Moist Heat;Neuromuscular re-education;Balance training;Therapeutic exercise;Therapeutic activities;Functional mobility training;Stair training;Gait training;Patient/family education;Manual techniques;Passive range of motion;Dry needling;Joint Manipulations;Spinal Manipulations;Taping    PT Next Visit Plan  Dry needling; postural re-education/spinal segmentation    PT Home Exercise Plan  thoracic extension, chest opening stretch, cervical CARs    Consulted and Agree with Plan of Care  Patient       Patient will benefit from skilled therapeutic intervention in order to improve the following deficits and impairments:  Decreased range of motion, Increased muscle spasms, Impaired UE functional use, Decreased endurance, Decreased activity tolerance, Pain, Improper body mechanics, Impaired flexibility, Postural dysfunction, Decreased strength  Visit Diagnosis: Neck pain  Abnormal posture  Muscle weakness (generalized)     Problem List Patient Active Problem List   Diagnosis Date Noted  . Cervical strain 11/28/2019  . Other fatigue 04/16/2019  . Endometriosis determined by laparoscopy 06/13/2018  . Ovarian cyst 03/01/2018  . Hx pulmonary embolism 02/03/2018  . Generalized anxiety disorder 10/12/2016  . Right knee pain 09/02/2016  . Severe recurrent major depression without psychotic  features ([place]) 07/23/2016  . Bulimia nervosa, in partial remission, mild 07/23/2016    Class: Chronic  . Iron deficiency anemia 04/08/2016  . B12 deficiency 04/08/2016  . S/P gastric bypass 03/20/2016  . Eating disorder 03/20/2016  . Insomnia 03/20/2016  . PTSD (post-traumatic stress disorder) 03/20/2016  . Hypothyroidism 03/20/2016  . PCOS (polycystic ovarian syndrome) 12/14/2014   Myles Gip PT, DPT [id number] 01/03/2020, 9:58 AM  [facility] [address] [place], [place], [address] Phone: [phone]   Fax:  [phone]  Name: Sharon Gibson MRN: [id number] Date of Birth: [birth year]

## 2020-01-10 ENCOUNTER — Ambulatory Visit: Payer: BC Managed Care – PPO | Admitting: Physical Therapy

## 2020-01-10 ENCOUNTER — Encounter: Payer: Self-pay | Admitting: Physical Therapy

## 2020-01-10 ENCOUNTER — Other Ambulatory Visit: Payer: Self-pay

## 2020-01-10 DIAGNOSIS — M6281 Muscle weakness (generalized): Secondary | ICD-10-CM

## 2020-01-10 DIAGNOSIS — M542 Cervicalgia: Secondary | ICD-10-CM | POA: Diagnosis not present

## 2020-01-10 DIAGNOSIS — R293 Abnormal posture: Secondary | ICD-10-CM | POA: Diagnosis not present

## 2020-01-10 NOTE — Therapy (Signed)
[place] [facility] [address]. [place], [place], [address] Phone: [phone]   Fax:  [phone]  Physical Therapy Treatment  Patient Details  Name: Sharon Gibson MRN: [id number] Date of Birth: 1981-03-12 Referring Provider (PT): Park Liter   Encounter Date: 01/10/2020  PT End of Session - 01/10/20 0802    Visit Number  5    Number of Visits  7    Date for PT Re-Evaluation  01/25/20    PT Start Time  0758    PT Stop Time  0856    PT Time Calculation (min)  58 min    Activity Tolerance  Patient tolerated treatment well    Behavior During Therapy  [facility] for tasks assessed/performed       Past Medical History:  Diagnosis Date  . Anxiety   . Chicken pox   . Depression   . Eating disorder    Has had residential treatment and hospitalization previously.  Marland Kitchen Heart murmur    as a child  . History of anemia    RECIEVES IRON INFUSIONS YEARLY  . History of pulmonary embolus (PE) 2002   bilaterally-followed by [facility] hematology-w/u negative per pt  . Hypothyroidism    no meds currently  . PTSD (post-traumatic stress disorder)     Past Surgical History:  Procedure Laterality Date  . CHOLECYSTECTOMY  2003  . EXCISION OF ENDOMETRIOMA    . GASTRIC BYPASS  2003  . LAPAROSCOPIC OVARIAN CYSTECTOMY Left 05/10/2018   Procedure: LAPAROSCOPIC OVARIAN CYSTECTOMY;  Surgeon: Homero Fellers, MD;  Location: [facility] ORS;  Service: Gynecology;  Laterality: Left;    There were no vitals filed for this visit.  Subjective Assessment - 01/10/20 0759    Subjective  Patient notes that she is not currently in pain but she can feel that if she does too much it will come on. She was able to get a different chair at work that has been more supportive. Patient notes that she has been mostly resting to allow her pain to come down. Patient notes that she can feel tension in her neck and and shoulders but has been doing breathing which is helping prevent  worsening tension.    Currently in Pain?  No/denies       TREATMENT  Manual Therapy: STM and TPR performed to thoracic and cervical mm to allow for decreased tension and pain and improved posture and function using vibratory and percussive instrument Cervical Mobilizations with rotational movement for improved mobility, grade II/III Cervical extension CPAs, grade II/III for improved joint mobility Suboccipital release with manual distraction, multiple bouts, for decreased pain and spasm Upper Thoracic B UPA, grade III/IV for improved joint mobility with rotation  Neuromuscular Re-education: Patient education on postural progression for improved tolerance with return to prior wellness activities. Seated lower thoracic extension over small ball for improved joint mobility and decreased strain on lower cervical spine.   Patient educated throughout session on appropriate technique and form using multi-modal cueing, HEP, and activity modification. Patient articulated understanding and returned demonstration.  Patient Response to interventions: Patient notes decreased tension at end of session  ASSESSMENT Patient presents to clinic with excellent motivation to participate in therapy. Patient demonstrates deficits in posture, cervical and shoulder mobility, strength, pain, and muscular endurance. Patient with improved pain (4/10, 3 point decrease) with cervical rotation during today's session and responded positively to manual and educational interventions. Patient will benefit from continued skilled therapeutic intervention to address remaining deficits in  posture, cervical and shoulder mobility, strength, pain, and muscular endurance in order to increase function and improve overall QOL.    PT Long Term Goals - 12/14/19 1147      PT LONG TERM GOAL #1   Title  Patient will be independent with HEP in order to improve strength and decrease back pain in order to improve pain-free function at  home and work.    Baseline  IE: not initiated    Time  6    Period  Weeks    Status  New    Target Date  01/25/20      PT LONG TERM GOAL #2   Title  Patient will decrease worst neck pain as reported on NPRS by at least 2 points in order to demonstrate clinically significant reduction in back pain.    Baseline  IE: 10/10    Time  6    Period  Weeks    Status  New    Target Date  01/25/20      PT LONG TERM GOAL #3   Title  Patient will demonstrate improved function as evidenced by a score of 66 on FOTO measure for full participation in activities at home and in the community.    Baseline  IE: 48    Time  6    Period  Weeks    Status  New    Target Date  01/25/20      PT LONG TERM GOAL #4   Title  Patient will demonstrate improved functional strength as evidenced by ability to perform side plank from knees without shoulder/neck pain exacerbation for participation in personal wellness activities.    Baseline  IE: unable    Time  6    Period  Weeks    Status  New    Target Date  01/25/20      PT LONG TERM GOAL #5   Title  Patient will demonstrate improved cervical rotation B as demonstrated by AROM greater than/equal to 70 degrees bilaterally with pain no greater than 4/10 for improved function/safety with driving.    Baseline  IE: R 64, L 58 (7/10)    Time  6    Period  Weeks    Status  New    Target Date  01/25/20            Plan - 01/10/20 0806    Clinical Impression Statement  Patient presents to clinic with excellent motivation to participate in therapy. Patient demonstrates deficits in posture, cervical and shoulder mobility, strength, pain, and muscular endurance. Patient with improved pain (4/10, 3 point decrease) with cervical rotation during today's session and responded positively to manual and educational interventions. Patient will benefit from continued skilled therapeutic intervention to address remaining deficits in posture, cervical and shoulder mobility,  strength, pain, and muscular endurance in order to increase function and improve overall QOL.    Personal Factors and Comorbidities  Comorbidity 3+;Age;Fitness;Past/Current Experience;Behavior Pattern;Time since onset of injury/illness/exacerbation    Comorbidities  insomnia, bulimia nervosa, anxiety, depression, PTSD    Examination-Activity Limitations  Bed Mobility;Sleep;Dressing;Lift;Carry;Reach Overhead    Examination-Participation Restrictions  Cleaning;Meal Prep;Shop;Yard Work;Laundry;Community Activity;Driving;Personal Finances    Stability/Clinical Decision Making  Evolving/Moderate complexity    Rehab Potential  Good    PT Frequency  1x / week    PT Duration  6 weeks    PT Treatment/Interventions  ADLs/Self Care Home Management;Cryotherapy;Electrical Stimulation;Moist Heat;Neuromuscular re-education;Balance training;Therapeutic exercise;Therapeutic activities;Functional mobility training;Stair training;Gait training;Patient/family education;Manual techniques;Passive range  of motion;Dry needling;Joint Manipulations;Spinal Manipulations;Taping    PT Next Visit Plan  Dry needling; postural re-education/spinal segmentation    PT Home Exercise Plan  thoracic extension, chest opening stretch, cervical CARs    Consulted and Agree with Plan of Care  Patient       Patient will benefit from skilled therapeutic intervention in order to improve the following deficits and impairments:  Decreased range of motion, Increased muscle spasms, Impaired UE functional use, Decreased endurance, Decreased activity tolerance, Pain, Improper body mechanics, Impaired flexibility, Postural dysfunction, Decreased strength  Visit Diagnosis: Neck pain  Abnormal posture  Muscle weakness (generalized)     Problem List Patient Active Problem List   Diagnosis Date Noted  . Cervical strain 11/28/2019  . Other fatigue 04/16/2019  . Endometriosis determined by laparoscopy 06/13/2018  . Ovarian cyst 03/01/2018   . Hx pulmonary embolism 02/03/2018  . Generalized anxiety disorder 10/12/2016  . Right knee pain 09/02/2016  . Severe recurrent major depression without psychotic features ([facility]) 07/23/2016  . Bulimia nervosa, in partial remission, mild 07/23/2016    Class: Chronic  . Iron deficiency anemia 04/08/2016  . B12 deficiency 04/08/2016  . S/P gastric bypass 03/20/2016  . Eating disorder 03/20/2016  . Insomnia 03/20/2016  . PTSD (post-traumatic stress disorder) 03/20/2016  . Hypothyroidism 03/20/2016  . PCOS (polycystic ovarian syndrome) 12/14/2014   Sheria Lang PT, DPT [id number] 01/10/2020, 9:15 AM  [facility] [address] [place], [place], [address] Phone: [phone]   Fax:  [phone]  Name: Elisheva Fallas MRN: [id number] Date of Birth: 10/19/1980

## 2020-01-17 ENCOUNTER — Other Ambulatory Visit: Payer: Self-pay

## 2020-01-17 ENCOUNTER — Ambulatory Visit: Payer: BC Managed Care – PPO | Admitting: Physical Therapy

## 2020-01-17 ENCOUNTER — Encounter: Payer: Self-pay | Admitting: Physical Therapy

## 2020-01-17 DIAGNOSIS — M542 Cervicalgia: Secondary | ICD-10-CM | POA: Diagnosis not present

## 2020-01-17 DIAGNOSIS — M6281 Muscle weakness (generalized): Secondary | ICD-10-CM | POA: Diagnosis not present

## 2020-01-17 DIAGNOSIS — R293 Abnormal posture: Secondary | ICD-10-CM

## 2020-01-17 NOTE — Therapy (Signed)
[facility] [address] [place], [place], [address] Phone: [phone]   Fax:  [phone]  Physical Therapy Treatment  Patient Details  Name: Sharon Gibson MRN: [id number] Date of Birth: 1981-05-15 Referring Provider (PT): Olevia Perches   Encounter Date: 01/17/2020  PT End of Session - 01/17/20 0802    Visit Number  6    Number of Visits  7    Date for PT Re-Evaluation  01/25/20    PT Start Time  0757    PT Stop Time  0915    PT Time Calculation (min)  78 min    Activity Tolerance  Patient tolerated treatment well    Behavior During Therapy  [facility] for tasks assessed/performed       Past Medical History:  Diagnosis Date  . Anxiety   . Chicken pox   . Depression   . Eating disorder    Has had residential treatment and hospitalization previously.  Marland Kitchen Heart murmur    as a child  . History of anemia    RECIEVES IRON INFUSIONS YEARLY  . History of pulmonary embolus (PE) 2002   bilaterally-followed by [facility] hematology-w/u negative per pt  . Hypothyroidism    no meds currently  . PTSD (post-traumatic stress disorder)     Past Surgical History:  Procedure Laterality Date  . CHOLECYSTECTOMY  2003  . EXCISION OF ENDOMETRIOMA    . GASTRIC BYPASS  2003  . LAPAROSCOPIC OVARIAN CYSTECTOMY Left 05/10/2018   Procedure: LAPAROSCOPIC OVARIAN CYSTECTOMY;  Surgeon: Natale Milch, MD;  Location: [facility] ORS;  Service: Gynecology;  Laterality: Left;    There were no vitals filed for this visit.  Subjective Assessment - 01/17/20 0758    Subjective  Patient reports that she is very stressed presently. She notes that she did figure out some aggravating factors: pushing/pulling. She states that she is find to lift. She does report that some percussive therapy after spikes in pain have been helpful in calming the pain down.    Currently in Pain?  Yes    Pain Score  3     Pain Location  Shoulder    Pain Orientation   Right;Medial;Posterior       TREATMENT  Manual Therapy: STM and TPR performed to thoracic and cervical mm to allow for decreased tension and pain and improved posture and function using vibratory and percussive instrument Cervical Mobilizations with rotational movement for improved mobility, grade II/III Cervical extension CPAs, grade II/III for improved joint mobility Suboccipital release with manual distraction, multiple bouts, for decreased pain and spasm Upper Thoracic B UPA, grade III/IV for improved joint mobility with rotation  Neuromuscular Re-education: Seated thoracic extension over small ball for improved joint mobility  Seated R thoracic sidebending for improved joint mobility Seated R thoracic lateral shift for improved joint mobility  Seated R thoracic sidebending with lateral shift for improved joint mobility  Seated cervical retraction with R rotation for improved joint mobility Seated cervical retraction with R sidebending for improved joint mobility Shoulder circles in neutral position with small ball, CW/CCW for improved scapular and GH motor control/positional awareness  Patient educated throughout session on appropriate technique and form using multi-modal cueing, HEP, and activity modification. Patient articulated understanding and returned demonstration.  Patient Response to interventions: Patient notes 3-4/10 pain  ASSESSMENT Patient presents to clinic with excellent motivation to participate in therapy. Patient demonstrates deficits in posture, cervical and shoulder mobility, strength, pain, and muscular endurance. Patient tolerating more  active interventions without negative impact on pain during today's session and responded positively to manual and educational interventions. Patient will benefit from continued skilled therapeutic intervention to address remaining deficits in posture, cervical and shoulder mobility, strength, pain, and muscular endurance in order  to increase function and improve overall QOL.     PT Long Term Goals - 12/14/19 1147      PT LONG TERM GOAL #1   Title  Patient will be independent with HEP in order to improve strength and decrease back pain in order to improve pain-free function at home and work.    Baseline  IE: not initiated    Time  6    Period  Weeks    Status  New    Target Date  01/25/20      PT LONG TERM GOAL #2   Title  Patient will decrease worst neck pain as reported on NPRS by at least 2 points in order to demonstrate clinically significant reduction in back pain.    Baseline  IE: 10/10    Time  6    Period  Weeks    Status  New    Target Date  01/25/20      PT LONG TERM GOAL #3   Title  Patient will demonstrate improved function as evidenced by a score of 66 on FOTO measure for full participation in activities at home and in the community.    Baseline  IE: 48    Time  6    Period  Weeks    Status  New    Target Date  01/25/20      PT LONG TERM GOAL #4   Title  Patient will demonstrate improved functional strength as evidenced by ability to perform side plank from knees without shoulder/neck pain exacerbation for participation in personal wellness activities.    Baseline  IE: unable    Time  6    Period  Weeks    Status  New    Target Date  01/25/20      PT LONG TERM GOAL #5   Title  Patient will demonstrate improved cervical rotation B as demonstrated by AROM greater than/equal to 70 degrees bilaterally with pain no greater than 4/10 for improved function/safety with driving.    Baseline  IE: R 64, L 58 (7/10)    Time  6    Period  Weeks    Status  New    Target Date  01/25/20            Plan - 01/17/20 0940    Clinical Impression Statement  Patient presents to clinic with excellent motivation to participate in therapy. Patient demonstrates deficits in posture, cervical and shoulder mobility, strength, pain, and muscular endurance. Patient tolerating more active interventions  without negative impact on pain during today's session and responded positively to manual and educational interventions. Patient will benefit from continued skilled therapeutic intervention to address remaining deficits in posture, cervical and shoulder mobility, strength, pain, and muscular endurance in order to increase function and improve overall QOL.    Personal Factors and Comorbidities  Comorbidity 3+;Age;Fitness;Past/Current Experience;Behavior Pattern;Time since onset of injury/illness/exacerbation    Comorbidities  insomnia, bulimia nervosa, anxiety, depression, PTSD    Examination-Activity Limitations  Bed Mobility;Sleep;Dressing;Lift;Carry;Reach Overhead    Examination-Participation Restrictions  Cleaning;Meal Prep;Shop;Yard Work;Laundry;Community Activity;Driving;Personal Finances    Stability/Clinical Decision Making  Evolving/Moderate complexity    Rehab Potential  Good    PT Frequency  1x / week  PT Duration  6 weeks    PT Treatment/Interventions  ADLs/Self Care Home Management;Cryotherapy;Electrical Stimulation;Moist Heat;Neuromuscular re-education;Balance training;Therapeutic exercise;Therapeutic activities;Functional mobility training;Stair training;Gait training;Patient/family education;Manual techniques;Passive range of motion;Dry needling;Joint Manipulations;Spinal Manipulations;Taping    PT Next Visit Plan  Dry needling; postural re-education/spinal segmentation    PT Home Exercise Plan  thoracic extension, chest opening stretch, cervical CARs    Consulted and Agree with Plan of Care  Patient       Patient will benefit from skilled therapeutic intervention in order to improve the following deficits and impairments:  Decreased range of motion, Increased muscle spasms, Impaired UE functional use, Decreased endurance, Decreased activity tolerance, Pain, Improper body mechanics, Impaired flexibility, Postural dysfunction, Decreased strength  Visit Diagnosis: Neck  pain  Abnormal posture  Muscle weakness (generalized)     Problem List Patient Active Problem List   Diagnosis Date Noted  . Cervical strain 11/28/2019  . Other fatigue 04/16/2019  . Endometriosis determined by laparoscopy 06/13/2018  . Ovarian cyst 03/01/2018  . Hx pulmonary embolism 02/03/2018  . Generalized anxiety disorder 10/12/2016  . Right knee pain 09/02/2016  . Severe recurrent major depression without psychotic features (HCC) 07/23/2016  . Bulimia nervosa, in partial remission, mild 07/23/2016    Class: Chronic  . Iron deficiency anemia 04/08/2016  . B12 deficiency 04/08/2016  . S/P gastric bypass 03/20/2016  . Eating disorder 03/20/2016  . Insomnia 03/20/2016  . PTSD (post-traumatic stress disorder) 03/20/2016  . Hypothyroidism 03/20/2016  . PCOS (polycystic ovarian syndrome) 12/14/2014   Sheria Lang PT, DPT [phone] 01/17/2020, 9:53 AM  [facility] [address] [place], [place], [address] Phone: [phone]   Fax:  [phone]  Name: Sharon Gibson MRN: [id number] Date of Birth: 06-Mar-1981

## 2020-01-23 ENCOUNTER — Other Ambulatory Visit: Payer: Self-pay

## 2020-01-23 ENCOUNTER — Inpatient Hospital Stay: Payer: BC Managed Care – PPO

## 2020-01-23 VITALS — BP 99/69 | HR 79 | Temp 96.3°F | Resp 18

## 2020-01-23 DIAGNOSIS — E538 Deficiency of other specified B group vitamins: Secondary | ICD-10-CM | POA: Diagnosis not present

## 2020-01-23 DIAGNOSIS — D509 Iron deficiency anemia, unspecified: Secondary | ICD-10-CM

## 2020-01-23 LAB — CBC WITH DIFFERENTIAL/PLATELET
Abs Immature Granulocytes: 0.03 10*3/uL (ref 0.00–0.07)
Basophils Absolute: 0.1 10*3/uL (ref 0.0–0.1)
Basophils Relative: 1 %
Eosinophils Absolute: 0.2 10*3/uL (ref 0.0–0.5)
Eosinophils Relative: 3 %
HCT: 42.8 % (ref 36.0–46.0)
Hemoglobin: 14 g/dL (ref 12.0–15.0)
Immature Granulocytes: 0 %
Lymphocytes Relative: 24 %
Lymphs Abs: 1.9 10*3/uL (ref 0.7–4.0)
MCH: 29.2 pg (ref 26.0–34.0)
MCHC: 32.7 g/dL (ref 30.0–36.0)
MCV: 89.2 fL (ref 80.0–100.0)
Monocytes Absolute: 0.6 10*3/uL (ref 0.1–1.0)
Monocytes Relative: 8 %
Neutro Abs: 4.9 10*3/uL (ref 1.7–7.7)
Neutrophils Relative %: 64 %
Platelets: 282 10*3/uL (ref 150–400)
RBC: 4.8 MIL/uL (ref 3.87–5.11)
RDW: 13.1 % (ref 11.5–15.5)
WBC: 7.8 10*3/uL (ref 4.0–10.5)
nRBC: 0 % (ref 0.0–0.2)

## 2020-01-23 LAB — FERRITIN: Ferritin: 47 ng/mL (ref 11–307)

## 2020-01-23 MED ORDER — CYANOCOBALAMIN 1000 MCG/ML IJ SOLN
1000.0000 ug | Freq: Once | INTRAMUSCULAR | Status: AC
  Administered 2020-01-23: 1000 ug via INTRAMUSCULAR

## 2020-01-24 ENCOUNTER — Ambulatory Visit: Payer: BC Managed Care – PPO | Admitting: Physical Therapy

## 2020-01-24 ENCOUNTER — Encounter: Payer: Self-pay | Admitting: Physical Therapy

## 2020-01-24 DIAGNOSIS — R293 Abnormal posture: Secondary | ICD-10-CM | POA: Diagnosis not present

## 2020-01-24 DIAGNOSIS — M542 Cervicalgia: Secondary | ICD-10-CM | POA: Diagnosis not present

## 2020-01-24 DIAGNOSIS — M6281 Muscle weakness (generalized): Secondary | ICD-10-CM

## 2020-01-24 NOTE — Therapy (Signed)
[facility] [address] [place], [place], [address] Phone: [phone]   Fax:  [phone]  Physical Therapy Treatment  Patient Details  Name: Sharon Gibson MRN: [id number] Date of Birth: 1981/07/24 Referring Provider (PT): Olevia Perches   Encounter Date: 01/24/2020  PT End of Session - 01/24/20 0821    Visit Number  7    Number of Visits  7    Date for PT Re-Evaluation  01/25/20    PT Start Time  0759    PT Stop Time  0855    PT Time Calculation (min)  56 min    Activity Tolerance  Patient tolerated treatment well    Behavior During Therapy  [facility] for tasks assessed/performed       Past Medical History:  Diagnosis Date  . Anxiety   . Chicken pox   . Depression   . Eating disorder    Has had residential treatment and hospitalization previously.  Marland Kitchen Heart murmur    as a child  . History of anemia    RECIEVES IRON INFUSIONS YEARLY  . History of pulmonary embolus (PE) 2002   bilaterally-followed by [facility] hematology-w/u negative per pt  . Hypothyroidism    no meds currently  . PTSD (post-traumatic stress disorder)     Past Surgical History:  Procedure Laterality Date  . CHOLECYSTECTOMY  2003  . EXCISION OF ENDOMETRIOMA    . GASTRIC BYPASS  2003  . LAPAROSCOPIC OVARIAN CYSTECTOMY Left 05/10/2018   Procedure: LAPAROSCOPIC OVARIAN CYSTECTOMY;  Surgeon: Natale Milch, MD;  Location: [facility] ORS;  Service: Gynecology;  Laterality: Left;    There were no vitals filed for this visit.  Subjective Assessment - 01/24/20 0802    Subjective  Patient reports that some of her HEP feels good, but doing ball circles with a neutral shoulder is aggravating. She notes that she had continued pain (4-5/10) through Saturday after doing the ball circles 3 days in a row. Patient notes she had her hair done over the weekend and was sitting for 3 hours and had intense tearful pain as a result. She notes her pain overall is intermittent,  but today she is noting a dull ache on the R side of her neck whereas the L side is more muscle tension.    Currently in Pain?  Yes    Pain Score  6     Pain Location  Scapula    Pain Orientation  Right;Left    Multiple Pain Sites  Yes    Pain Score  3    Pain Location  Neck    Pain Orientation  Right    Pain Descriptors / Indicators  Aching      TREATMENT  Manual Therapy: STM and TPR performed to cervical mm to allow for decreased tension and pain and improved posture and function Cervical Mobilizations with rotational movement for improved mobility, grade II/III Cervical extension CPAs, grade II/III for improved joint mobility Suboccipital release with manual distraction, multiple bouts, for decreased pain and spasm  Neuromuscular Re-education: Shoulder circles in neutral position with elbow bent small ball, CW/CCW for improved scapular and GH motor control/positional awareness Cervical rotation and lateral flexion with self-mobilization via towel  Treatments unbilled: IFC, periscapular region, 12.0-14.17mV, 90 degree sweep, 80-100 bps for pain relief. No skin irritation or signs of burn noted after removal of pads. 20 minutes during manual.  Patient educated throughout session on appropriate technique and form using multi-modal cueing, HEP, and activity  modification. Patient articulated understanding and returned demonstration.  Patient Response to interventions: Patient notes 1/10 pain at periscapular area  ASSESSMENT Patient presents to clinic with excellent motivation to participate in therapy. Patient demonstrates deficits in posture, cervical and shoulder mobility, strength, pain, and muscular endurance. Patient with significant pain reduction via combined manual and IFC treatment during today's session and responded positively to neuromuscular re-educational interventions. Patient will benefit from continued skilled therapeutic intervention to address remaining deficits in  posture, cervical and shoulder mobility, strength, pain, and muscular endurance in order to increase function and improve overall QOL.    PT Long Term Goals - 12/14/19 1147      PT LONG TERM GOAL #1   Title  Patient will be independent with HEP in order to improve strength and decrease back pain in order to improve pain-free function at home and work.    Baseline  IE: not initiated    Time  6    Period  Weeks    Status  New    Target Date  01/25/20      PT LONG TERM GOAL #2   Title  Patient will decrease worst neck pain as reported on NPRS by at least 2 points in order to demonstrate clinically significant reduction in back pain.    Baseline  IE: 10/10    Time  6    Period  Weeks    Status  New    Target Date  01/25/20      PT LONG TERM GOAL #3   Title  Patient will demonstrate improved function as evidenced by a score of 66 on FOTO measure for full participation in activities at home and in the community.    Baseline  IE: 48    Time  6    Period  Weeks    Status  New    Target Date  01/25/20      PT LONG TERM GOAL #4   Title  Patient will demonstrate improved functional strength as evidenced by ability to perform side plank from knees without shoulder/neck pain exacerbation for participation in personal wellness activities.    Baseline  IE: unable    Time  6    Period  Weeks    Status  New    Target Date  01/25/20      PT LONG TERM GOAL #5   Title  Patient will demonstrate improved cervical rotation B as demonstrated by AROM greater than/equal to 70 degrees bilaterally with pain no greater than 4/10 for improved function/safety with driving.    Baseline  IE: R 64, L 58 (7/10)    Time  6    Period  Weeks    Status  New    Target Date  01/25/20            Plan - 01/24/20 4782    Clinical Impression Statement  Patient presents to clinic with excellent motivation to participate in therapy. Patient demonstrates deficits in posture, cervical and shoulder mobility,  strength, pain, and muscular endurance. Patient with significant pain reduction via combined manual and IFC treatment during today's session and responded positively to neuromuscular re-educational interventions. Patient will benefit from continued skilled therapeutic intervention to address remaining deficits in posture, cervical and shoulder mobility, strength, pain, and muscular endurance in order to increase function and improve overall QOL.    Personal Factors and Comorbidities  Comorbidity 3+;Age;Fitness;Past/Current Experience;Behavior Pattern;Time since onset of injury/illness/exacerbation    Comorbidities  insomnia, bulimia nervosa,  anxiety, depression, PTSD    Examination-Activity Limitations  Bed Mobility;Sleep;Dressing;Lift;Carry;Reach Overhead    Examination-Participation Restrictions  Cleaning;Meal Prep;Shop;Yard Work;Laundry;Community Activity;Driving;Personal Finances    Stability/Clinical Decision Making  Evolving/Moderate complexity    Rehab Potential  Good    PT Frequency  1x / week    PT Duration  6 weeks    PT Treatment/Interventions  ADLs/Self Care Home Management;Cryotherapy;Electrical Stimulation;Moist Heat;Neuromuscular re-education;Balance training;Therapeutic exercise;Therapeutic activities;Functional mobility training;Stair training;Gait training;Patient/family education;Manual techniques;Passive range of motion;Dry needling;Joint Manipulations;Spinal Manipulations;Taping    PT Next Visit Plan  Dry needling; postural re-education/spinal segmentation    PT Home Exercise Plan  thoracic extension, chest opening stretch, cervical CARs    Consulted and Agree with Plan of Care  Patient       Patient will benefit from skilled therapeutic intervention in order to improve the following deficits and impairments:  Decreased range of motion, Increased muscle spasms, Impaired UE functional use, Decreased endurance, Decreased activity tolerance, Pain, Improper body mechanics, Impaired  flexibility, Postural dysfunction, Decreased strength  Visit Diagnosis: Neck pain  Abnormal posture  Muscle weakness (generalized)     Problem List Patient Active Problem List   Diagnosis Date Noted  . Cervical strain 11/28/2019  . Other fatigue 04/16/2019  . Endometriosis determined by laparoscopy 06/13/2018  . Ovarian cyst 03/01/2018  . Hx pulmonary embolism 02/03/2018  . Generalized anxiety disorder 10/12/2016  . Right knee pain 09/02/2016  . Severe recurrent major depression without psychotic features (HCC) 07/23/2016  . Bulimia nervosa, in partial remission, mild 07/23/2016    Class: Chronic  . Iron deficiency anemia 04/08/2016  . B12 deficiency 04/08/2016  . S/P gastric bypass 03/20/2016  . Eating disorder 03/20/2016  . Insomnia 03/20/2016  . PTSD (post-traumatic stress disorder) 03/20/2016  . Hypothyroidism 03/20/2016  . PCOS (polycystic ovarian syndrome) 12/14/2014   Sheria Lang PT, DPT [phone]  01/24/2020, 9:46 AM  [facility] [address] [place], [place], [address] Phone: [phone]   Fax:  [phone]  Name: Yaneth Fairbairn MRN: [id number] Date of Birth: 1981/08/28

## 2020-01-25 ENCOUNTER — Inpatient Hospital Stay: Payer: BC Managed Care – PPO

## 2020-02-01 ENCOUNTER — Ambulatory Visit (INDEPENDENT_AMBULATORY_CARE_PROVIDER_SITE_OTHER): Payer: BC Managed Care – PPO | Admitting: Family Medicine

## 2020-02-01 ENCOUNTER — Other Ambulatory Visit: Payer: Self-pay

## 2020-02-01 ENCOUNTER — Encounter: Payer: Self-pay | Admitting: Family Medicine

## 2020-02-01 VITALS — BP 126/76 | HR 76 | Temp 98.5°F | Ht 63.19 in | Wt 286.1 lb

## 2020-02-01 DIAGNOSIS — Z0000 Encounter for general adult medical examination without abnormal findings: Secondary | ICD-10-CM | POA: Diagnosis not present

## 2020-02-01 DIAGNOSIS — E538 Deficiency of other specified B group vitamins: Secondary | ICD-10-CM | POA: Diagnosis not present

## 2020-02-01 DIAGNOSIS — R8281 Pyuria: Secondary | ICD-10-CM

## 2020-02-01 DIAGNOSIS — E039 Hypothyroidism, unspecified: Secondary | ICD-10-CM | POA: Diagnosis not present

## 2020-02-01 DIAGNOSIS — F332 Major depressive disorder, recurrent severe without psychotic features: Secondary | ICD-10-CM

## 2020-02-01 DIAGNOSIS — F329 Major depressive disorder, single episode, unspecified: Secondary | ICD-10-CM

## 2020-02-01 DIAGNOSIS — F411 Generalized anxiety disorder: Secondary | ICD-10-CM | POA: Diagnosis not present

## 2020-02-01 DIAGNOSIS — D509 Iron deficiency anemia, unspecified: Secondary | ICD-10-CM

## 2020-02-01 DIAGNOSIS — F32A Depression, unspecified: Secondary | ICD-10-CM

## 2020-02-01 DIAGNOSIS — R8271 Bacteriuria: Secondary | ICD-10-CM | POA: Diagnosis not present

## 2020-02-01 DIAGNOSIS — Z114 Encounter for screening for human immunodeficiency virus [HIV]: Secondary | ICD-10-CM

## 2020-02-01 DIAGNOSIS — F419 Anxiety disorder, unspecified: Secondary | ICD-10-CM

## 2020-02-01 MED ORDER — CLONAZEPAM 0.5 MG PO TABS
0.5000 mg | ORAL_TABLET | Freq: Two times a day (BID) | ORAL | 0 refills | Status: DC | PRN
Start: 2020-03-02 — End: 2020-05-08

## 2020-02-01 MED ORDER — CLONAZEPAM 0.5 MG PO TABS: 0.5000 mg | ORAL_TABLET | Freq: Two times a day (BID) | ORAL | 0 refills | Status: DC | PRN

## 2020-02-01 MED ORDER — CLONAZEPAM 0.5 MG PO TABS
0.5000 mg | ORAL_TABLET | Freq: Two times a day (BID) | ORAL | 0 refills | Status: DC | PRN
Start: 2020-04-01 — End: 2020-08-16

## 2020-02-01 MED ORDER — CYCLOBENZAPRINE HCL 10 MG PO TABS: 5.0000 mg | ORAL_TABLET | Freq: Three times a day (TID) | ORAL | 3 refills | Status: DC

## 2020-02-01 NOTE — Assessment & Plan Note (Signed)
Not under great control. Will restart her medicine regularly and get into counseling. Refills of her klonopin given today. Call with any concerns.  

## 2020-02-01 NOTE — Assessment & Plan Note (Signed)
Rechecking labs today. Await results. Treat as needed.  °

## 2020-02-01 NOTE — Patient Instructions (Signed)
Health Maintenance, Female Adopting a healthy lifestyle and getting preventive care are important in promoting health and wellness. Ask your health care provider about:  The right schedule for you to have regular tests and exams.  Things you can do on your own to prevent diseases and keep yourself healthy. What should I know about diet, weight, and exercise? Eat a healthy diet   Eat a diet that includes plenty of vegetables, fruits, low-fat dairy products, and lean protein.  Do not eat a lot of foods that are high in solid fats, added sugars, or sodium. Maintain a healthy weight Body mass index (BMI) is used to identify weight problems. It estimates body fat based on height and weight. Your health care provider can help determine your BMI and help you achieve or maintain a healthy weight. Get regular exercise Get regular exercise. This is one of the most important things you can do for your health. Most adults should:  Exercise for at least 150 minutes each week. The exercise should increase your heart rate and make you sweat (moderate-intensity exercise).  Do strengthening exercises at least twice a week. This is in addition to the moderate-intensity exercise.  Spend less time sitting. Even light physical activity can be beneficial. Watch cholesterol and blood lipids Have your blood tested for lipids and cholesterol at 40 years of age, then have this test every 5 years. Have your cholesterol levels checked more often if:  Your lipid or cholesterol levels are high.  You are older than 40 years of age.  You are at high risk for heart disease. What should I know about cancer screening? Depending on your health history and family history, you may need to have cancer screening at various ages. This may include screening for:  Breast cancer.  Cervical cancer.  Colorectal cancer.  Skin cancer.  Lung cancer. What should I know about heart disease, diabetes, and high blood  pressure? Blood pressure and heart disease  High blood pressure causes heart disease and increases the risk of stroke. This is more likely to develop in people who have high blood pressure readings, are of African descent, or are overweight.  Have your blood pressure checked: ? Every 3-5 years if you are 18-39 years of age. ? Every year if you are 40 years old or older. Diabetes Have regular diabetes screenings. This checks your fasting blood sugar level. Have the screening done:  Once every three years after age 40 if you are at a normal weight and have a low risk for diabetes.  More often and at a younger age if you are overweight or have a high risk for diabetes. What should I know about preventing infection? Hepatitis B If you have a higher risk for hepatitis B, you should be screened for this virus. Talk with your health care provider to find out if you are at risk for hepatitis B infection. Hepatitis C Testing is recommended for:  Everyone born from 1945 through 1965.  Anyone with known risk factors for hepatitis C. Sexually transmitted infections (STIs)  Get screened for STIs, including gonorrhea and chlamydia, if: ? You are sexually active and are younger than 40 years of age. ? You are older than 40 years of age and your health care provider tells you that you are at risk for this type of infection. ? Your sexual activity has changed since you were last screened, and you are at increased risk for chlamydia or gonorrhea. Ask your health care provider if   you are at risk.  Ask your health care provider about whether you are at high risk for HIV. Your health care provider may recommend a prescription medicine to help prevent HIV infection. If you choose to take medicine to prevent HIV, you should first get tested for HIV. You should then be tested every 3 months for as long as you are taking the medicine. Pregnancy  If you are about to stop having your period (premenopausal) and  you may become pregnant, seek counseling before you get pregnant.  Take 400 to 800 micrograms (mcg) of folic acid every day if you become pregnant.  Ask for birth control (contraception) if you want to prevent pregnancy. Osteoporosis and menopause Osteoporosis is a disease in which the bones lose minerals and strength with aging. This can result in bone fractures. If you are 65 years old or older, or if you are at risk for osteoporosis and fractures, ask your health care provider if you should:  Be screened for bone loss.  Take a calcium or vitamin D supplement to lower your risk of fractures.  Be given hormone replacement therapy (HRT) to treat symptoms of menopause. Follow these instructions at home: Lifestyle  Do not use any products that contain nicotine or tobacco, such as cigarettes, e-cigarettes, and chewing tobacco. If you need help quitting, ask your health care provider.  Do not use street drugs.  Do not share needles.  Ask your health care provider for help if you need support or information about quitting drugs. Alcohol use  Do not drink alcohol if: ? Your health care provider tells you not to drink. ? You are pregnant, may be pregnant, or are planning to become pregnant.  If you drink alcohol: ? Limit how much you use to 0-1 drink a day. ? Limit intake if you are breastfeeding.  Be aware of how much alcohol is in your drink. In the [place] one drink equals one 12 oz bottle of beer (355 mL), one 5 oz glass of wine (148 mL), or one 1 oz glass of hard liquor (44 mL). General instructions  Schedule regular health, dental, and eye exams.  Stay current with your vaccines.  Tell your health care provider if: ? You often feel depressed. ? You have ever been abused or do not feel safe at home. Summary  Adopting a healthy lifestyle and getting preventive care are important in promoting health and wellness.  Follow your health care provider's instructions about healthy  diet, exercising, and getting tested or screened for diseases.  Follow your health care provider's instructions on monitoring your cholesterol and blood pressure. This information is not intended to replace advice given to you by your health care provider. Make sure you discuss any questions you have with your health care provider. Document Revised: 09/07/2018 Document Reviewed: 09/07/2018 Elsevier Patient Education  2020 [employer] Inc[employer]  

## 2020-02-01 NOTE — Progress Notes (Signed)
BP 126/76   Pulse 76   Temp 98.5 F (36.9 C)   Ht 5' 3.19" (1.605 m)   Wt 286 lb 2 oz (129.8 kg)   SpO2 97%   BMI 50.38 kg/m    Subjective:    Patient ID: Sharon Gibson, female    DOB: November 19, 1980, 39 y.o.   MRN: [id number]  HPI: Sharon Gibson is a 39 y.o. female presenting on 02/01/2020 for comprehensive medical examination. Current medical complaints include:  Has not been taking any of her medication consistently. Was having some nausea with the wellbutrin and the zoloft. She was working out and had been feeling well. She got injured and stopped working out. She started feeling worse. She is taking the medication, she just hasn't been taking it consistently. Has therapy available through work, but has not started it yet.   ANXIETY/DEPRESSION Duration: chronic Status:exacerbated Anxious mood: yes  Excessive worrying: yes Irritability: yes  Sweating: no Nausea: yes Palpitations:yes Hyperventilation: no Panic attacks: no Agoraphobia: no  Obscessions/compulsions: no Depressed mood: yes Depression screen [facility] 2/9 02/01/2020 12/11/2019 06/06/2019 05/23/2019 12/19/2018  Decreased Interest 2 2 1 3 3   Down, Depressed, Hopeless 2 1 1 3 3   PHQ - 2 Score 4 3 2 6 6   Altered sleeping 3 2 0 3 3  Tired, decreased energy 3 3 0 3 3  Change in appetite 3 3 3 3 3   Feeling bad or failure about yourself  3 2 2 3 3   Trouble concentrating 3 1 0 3 3  Moving slowly or fidgety/restless 3 0 0 2 0  Suicidal thoughts 1 1 0 3 1  PHQ-9 Score 23 15 7 26 22   Difficult doing work/chores Very difficult Very difficult Not difficult at all Extremely dIfficult Very difficult   GAD 7 : Generalized Anxiety Score 02/01/2020 12/11/2019 06/06/2019 05/23/2019  Nervous, Anxious, on Edge 3 3 1 2   Control/stop worrying 3 3 1 2   Worry too much - different things 3 3 1 2   Trouble relaxing 3 3 1 3   Restless 3 3 0 3  Easily annoyed or irritable 3 3 0 1  Afraid - awful might happen 2 3 1 2   Total GAD 7 Score 20 21 5  15   Anxiety Difficulty Extremely difficult Very difficult Not difficult at all Extremely difficult   Anhedonia: no Weight changes: no Insomnia: yes   Hypersomnia: yes Fatigue/loss of energy: yes Feelings of worthlessness: no Feelings of guilt: no Impaired concentration/indecisiveness: no Suicidal ideations: no  Crying spells: no Recent Stressors/Life Changes: yes   Relationship problems: no   Family stress: no     Financial stress: no    Job stress: no    Recent death/loss: no  HYPOTHYROIDISM- has not been taking her medicine in about a month Thyroid control status:uncontrolled Satisfied with current treatment? yes Medication side effects: no Medication compliance: poor compliance Recent dose adjustment:no Fatigue: yes Cold intolerance: no Heat intolerance: no Weight gain: no Weight loss: no Constipation: no Diarrhea/loose stools: no Palpitations: no Lower extremity edema: no Anxiety/depressed mood: yes  Menopausal Symptoms: no  Depression Screen done today and results listed below:  Depression screen [facility] 2/9 02/01/2020 12/11/2019 06/06/2019 05/23/2019 12/19/2018  Decreased Interest 2 2 1 3 3   Down, Depressed, Hopeless 2 1 1 3 3   PHQ - 2 Score 4 3 2 6 6   Altered sleeping 3 2 0 3 3  Tired, decreased energy 3 3 0 3 3  Change in appetite 3 3 3  3  3  Feeling bad or failure about yourself  3 2 2 3 3   Trouble concentrating 3 1 0 3 3  Moving slowly or fidgety/restless 3 0 0 2 0  Suicidal thoughts 1 1 0 3 1  PHQ-9 Score 23 15 7 26 22   Difficult doing work/chores Very difficult Very difficult Not difficult at all Extremely dIfficult Very difficult    Past Medical History:  Past Medical History:  Diagnosis Date  . Anemia   . Anxiety   . Chicken pox   . Depression   . Eating disorder    Has had residential treatment and hospitalization previously.  Marland Kitchen Heart murmur    as a child  . History of anemia    RECIEVES IRON INFUSIONS YEARLY  . History of pulmonary embolus (PE)  2002   bilaterally-followed by [facility] hematology-w/u negative per pt  . Hypothyroidism    no meds currently  . PTSD (post-traumatic stress disorder)     Surgical History:  Past Surgical History:  Procedure Laterality Date  . CHOLECYSTECTOMY  2003  . EXCISION OF ENDOMETRIOMA    . GASTRIC BYPASS  2003  . LAPAROSCOPIC OVARIAN CYSTECTOMY Left 05/10/2018   Procedure: LAPAROSCOPIC OVARIAN CYSTECTOMY;  Surgeon: Homero Fellers, MD;  Location: [facility] ORS;  Service: Gynecology;  Laterality: Left;    Medications:  Current Outpatient Medications on File Prior to Visit  Medication Sig  . buPROPion (WELLBUTRIN SR) 150 MG 12 hr tablet 1 tab qAM x 3-4 days, then 1 tab BID after that  . diclofenac Sodium (VOLTAREN) 1 % GEL Apply 2 g topically 4 (four) times daily as needed (neck/shoulder pain).  Marland Kitchen levonorgestrel (MIRENA) 20 MCG/24HR IUD 1 each by Intrauterine route once.  Marland Kitchen levothyroxine (SYNTHROID) 75 MCG tablet Take 1 tablet (75 mcg total) by mouth daily.  . Multiple Vitamin (MULTIVITAMIN) tablet Take 1 tablet by mouth daily.  . sertraline (ZOLOFT) 100 MG tablet Take 2 tablets (200 mg total) by mouth at bedtime.   No current facility-administered medications on file prior to visit.    Allergies:  Allergies  Allergen Reactions  . Penicillins Hives    Has patient had a PCN reaction causing immediate rash, facial/tongue/throat swelling, SOB or lightheadedness with hypotension: yes Has patient had a PCN reaction causing severe rash involving mucus membranes or skin necrosis: no Has patient had a PCN reaction that required hospitalization: yes Has patient had a PCN reaction occurring within the last 10 years: no If all of the above answers are "NO", then may proceed with Cephalosporin use.   . Azithromycin   . Other     Narcotics- Burning in the stomach (whether PO or IV)  . Strawberry (Diagnostic) Swelling    AND WALNUTS-TONGUE SWELLING    Social History:  Social History    Socioeconomic History  . Marital status: Single    Spouse name: Not on file  . Number of children: Not on file  . Years of education: Not on file  . Highest education level: Not on file  Occupational History  . Not on file  Tobacco Use  . Smoking status: Never Smoker  . Smokeless tobacco: Never Used  Substance and Sexual Activity  . Alcohol use: Yes    Alcohol/week: 0.0 - 1.0 standard drinks    Comment: rare  . Drug use: No  . Sexual activity: Yes    Birth control/protection: I.U.D.    Comment: Mirena   Other Topics Concern  . Not on file  Social History  Narrative  . Not on file   Social Determinants of Health   Financial Resource Strain:   . Difficulty of Paying Living Expenses:   Food Insecurity:   . Worried About Programme researcher, broadcasting/film/video in the Last Year:   . Barista in the Last Year:   Transportation Needs:   . Freight forwarder (Medical):   Marland Kitchen Lack of Transportation (Non-Medical):   Physical Activity:   . Days of Exercise per Week:   . Minutes of Exercise per Session:   Stress:   . Feeling of Stress :   Social Connections:   . Frequency of Communication with Friends and Family:   . Frequency of Social Gatherings with Friends and Family:   . Attends Religious Services:   . Active Member of Clubs or Organizations:   . Attends Banker Meetings:   Marland Kitchen Marital Status:   Intimate Partner Violence:   . Fear of Current or Ex-Partner:   . Emotionally Abused:   Marland Kitchen Physically Abused:   . Sexually Abused:    Social History   Tobacco Use  Smoking Status Never Smoker  Smokeless Tobacco Never Used   Social History   Substance and Sexual Activity  Alcohol Use Yes  . Alcohol/week: 0.0 - 1.0 standard drinks   Comment: rare    Family History:  Family History  Problem Relation Age of Onset  . Diabetes Father   . Anxiety disorder Father   . Prostate cancer Paternal Grandfather   . Alzheimer's disease Maternal Grandmother   . Alzheimer's  disease Maternal Grandfather   . Lung cancer Paternal Grandmother     Past medical history, surgical history, medications, allergies, family history and social history reviewed with patient today and changes made to appropriate areas of the chart.   Review of Systems  Constitutional: Negative.   HENT: Negative.   Eyes: Negative.   Respiratory: Negative.   Cardiovascular: Positive for leg swelling. Negative for chest pain, palpitations, orthopnea, claudication and PND.  Gastrointestinal: Positive for nausea. Negative for abdominal pain, blood in stool, constipation, diarrhea, heartburn, melena and vomiting.  Genitourinary: Negative.   Musculoskeletal: Positive for myalgias and neck pain. Negative for back pain, falls and joint pain.  Skin: Negative.   Neurological: Negative.   Endo/Heme/Allergies: Positive for environmental allergies. Negative for polydipsia. Does not bruise/bleed easily.  Psychiatric/Behavioral: Positive for depression. Negative for hallucinations, memory loss, substance abuse and suicidal ideas. The patient is nervous/anxious. The patient does not have insomnia.     All other ROS negative except what is listed above and in the HPI.      Objective:    BP 126/76   Pulse 76   Temp 98.5 F (36.9 C)   Ht 5' 3.19" (1.605 m)   Wt 286 lb 2 oz (129.8 kg)   SpO2 97%   BMI 50.38 kg/m   Wt Readings from Last 3 Encounters:  02/01/20 286 lb 2 oz (129.8 kg)  11/15/19 274 lb (124.3 kg)  10/25/19 279 lb 1.6 oz (126.6 kg)    Physical Exam Vitals and nursing note reviewed.  Constitutional:      General: She is not in acute distress.    Appearance: Normal appearance. She is not ill-appearing, toxic-appearing or diaphoretic.  HENT:     Head: Normocephalic and atraumatic.     Right Ear: Tympanic membrane, ear canal and external ear normal. There is no impacted cerumen.     Left Ear: Tympanic membrane, ear canal and  external ear normal. There is no impacted cerumen.      Nose: Nose normal. No congestion or rhinorrhea.     Mouth/Throat:     Mouth: Mucous membranes are moist.     Pharynx: Oropharynx is clear. No oropharyngeal exudate or posterior oropharyngeal erythema.  Eyes:     General: No scleral icterus.       Right eye: No discharge.        Left eye: No discharge.     Extraocular Movements: Extraocular movements intact.     Conjunctiva/sclera: Conjunctivae normal.     Pupils: Pupils are equal, round, and reactive to light.  Neck:     Vascular: No carotid bruit.  Cardiovascular:     Rate and Rhythm: Normal rate and regular rhythm.     Pulses: Normal pulses.     Heart sounds: No murmur. No friction rub. No gallop.   Pulmonary:     Effort: Pulmonary effort is normal. No respiratory distress.     Breath sounds: Normal breath sounds. No stridor. No wheezing, rhonchi or rales.  Chest:     Chest wall: No tenderness.  Abdominal:     General: Abdomen is flat. Bowel sounds are normal. There is no distension.     Palpations: Abdomen is soft. There is no mass.     Tenderness: There is no abdominal tenderness. There is no right CVA tenderness, left CVA tenderness, guarding or rebound.     Hernia: No hernia is present.  Genitourinary:    Comments: Breast and pelvic exams deferred with shared decision making- done at GYN Musculoskeletal:        General: No swelling, tenderness, deformity or signs of injury.     Cervical back: Normal range of motion and neck supple. No rigidity. No muscular tenderness.     Right lower leg: No edema.     Left lower leg: No edema.  Lymphadenopathy:     Cervical: No cervical adenopathy.  Skin:    General: Skin is warm and dry.     Capillary Refill: Capillary refill takes less than 2 seconds.     Coloration: Skin is not jaundiced or pale.     Findings: No bruising, erythema, lesion or rash.  Neurological:     General: No focal deficit present.     Mental Status: She is alert and oriented to person, place, and time. Mental  status is at baseline.     Cranial Nerves: No cranial nerve deficit.     Sensory: No sensory deficit.     Motor: No weakness.     Coordination: Coordination normal.     Gait: Gait normal.     Deep Tendon Reflexes: Reflexes normal.  Psychiatric:        Mood and Affect: Mood normal.        Behavior: Behavior normal.        Thought Content: Thought content normal.        Judgment: Judgment normal.     Results for orders placed or performed in visit on 02/01/20  Microscopic Examination   URINE  Result Value Ref Range   WBC, UA 0-5 0 - 5 /hpf   RBC None seen 0 - 2 /hpf   Epithelial Cells (non renal) 0-10 0 - 10 /hpf   Bacteria, UA Few (A) None seen/Few  Urine Culture, Reflex   URINE  Result Value Ref Range   Urine Culture, Routine WILL FOLLOW   UA/M w/rflx Culture, Routine   Specimen: Urine  URINE  Result Value Ref Range   Specific Gravity, UA 1.010 1.005 - 1.030   pH, UA 6.5 5.0 - 7.5   Color, UA Yellow Yellow   Appearance Ur Clear Clear   Leukocytes,UA Trace (A) Negative   Protein,UA Negative Negative/Trace   Glucose, UA Negative Negative   Ketones, UA Negative Negative   RBC, UA Negative Negative   Bilirubin, UA Negative Negative   Urobilinogen, Ur 0.2 0.2 - 1.0 mg/dL   Nitrite, UA Negative Negative   Microscopic Examination See below:    Urinalysis Reflex Comment       Assessment & Plan:   Problem List Items Addressed This Visit      Endocrine   Hypothyroidism    Has not been taking her medicine regularly. Restart medicine regularly. Call with any concerns.       Relevant Orders   CBC with Differential/Platelet   Comprehensive metabolic panel   TSH     Other   Iron deficiency anemia    Rechecking labs today. Await results. Treat as needed.       Relevant Orders   CBC with Differential/Platelet   Comprehensive metabolic panel   Iron and TIBC   Ferritin   B12 deficiency    Rechecking labs today. Await results. Treat as needed.       Relevant  Orders   CBC with Differential/Platelet   Comprehensive metabolic panel   H41   Severe recurrent major depression without psychotic features (HCC)    Not under great control. Will restart her medicine regularly and get into counseling. Refills of her klonopin given today. Call with any concerns.       Relevant Orders   CBC with Differential/Platelet   Comprehensive metabolic panel   Generalized anxiety disorder    Not under great control. Will restart her medicine regularly and get into counseling. Refills of her klonopin given today. Call with any concerns.       Relevant Orders   CBC with Differential/Platelet   Comprehensive metabolic panel    Other Visit Diagnoses    Routine general medical examination at a health care facility    -  Primary   Vaccines up to date/declined. Screening labs checked today. Pap through GYN. Continue to monitor.    Relevant Orders   CBC with Differential/Platelet   Comprehensive metabolic panel   Lipid Panel w/o Chol/HDL Ratio   UA/M w/rflx Culture, Routine (Completed)   Anxiety and depression       Relevant Medications   clonazePAM (KLONOPIN) 0.5 MG tablet   Screening for HIV (human immunodeficiency virus)       Labs drawn today. Await results.    Relevant Orders   HIV Antibody (routine testing w rflx)       Follow up plan: Return in about 3 months (around 05/01/2020).   LABORATORY TESTING:  - Pap smear: done elsewhere  IMMUNIZATIONS:   - Tdap: Tetanus vaccination status reviewed: Declined. - Influenza: Postponed to flu season  PATIENT COUNSELING:   Advised to take 1 mg of folate supplement per day if capable of pregnancy.   Sexuality: Discussed sexually transmitted diseases, partner selection, use of condoms, avoidance of unintended pregnancy  and contraceptive alternatives.   Advised to avoid cigarette smoking.  I discussed with the patient that most people either abstain from alcohol or drink within safe limits (<=14/week and  <=4 drinks/occasion for males, <=7/weeks and <= 3 drinks/occasion for females) and that the risk for alcohol disorders and other health  effects rises proportionally with the number of drinks per week and how often a drinker exceeds daily limits.  Discussed cessation/primary prevention of drug use and availability of treatment for abuse.   Diet: Encouraged to adjust caloric intake to maintain  or achieve ideal body weight, to reduce intake of dietary saturated fat and total fat, to limit sodium intake by avoiding high sodium foods and not adding table salt, and to maintain adequate dietary potassium and calcium preferably from fresh fruits, vegetables, and low-fat dairy products.    stressed the importance of regular exercise  Injury prevention: Discussed safety belts, safety helmets, smoke detector, smoking near bedding or upholstery.   Dental health: Discussed importance of regular tooth brushing, flossing, and dental visits.    NEXT PREVENTATIVE PHYSICAL DUE IN 1 YEAR. Return in about 3 months (around 05/01/2020).

## 2020-02-01 NOTE — Assessment & Plan Note (Signed)
Not under great control. Will restart her medicine regularly and get into counseling. Refills of her klonopin given today. Call with any concerns.

## 2020-02-01 NOTE — Assessment & Plan Note (Signed)
Has not been taking her medicine regularly. Restart medicine regularly. Call with any concerns.

## 2020-02-02 LAB — COMPREHENSIVE METABOLIC PANEL
ALT: 14 IU/L (ref 0–32)
AST: 15 IU/L (ref 0–40)
Albumin/Globulin Ratio: 2.3 — ABNORMAL HIGH (ref 1.2–2.2)
Albumin: 4.3 g/dL (ref 3.8–4.8)
Alkaline Phosphatase: 91 IU/L (ref 39–117)
BUN/Creatinine Ratio: 28 — ABNORMAL HIGH (ref 9–23)
BUN: 19 mg/dL (ref 6–20)
Bilirubin Total: 0.3 mg/dL (ref 0.0–1.2)
CO2: 21 mmol/L (ref 20–29)
Calcium: 9.5 mg/dL (ref 8.7–10.2)
Chloride: 106 mmol/L (ref 96–106)
Creatinine, Ser: 0.68 mg/dL (ref 0.57–1.00)
GFR calc Af Amer: 127 mL/min/{1.73_m2} (ref >59)
GFR calc non Af Amer: 111 mL/min/{1.73_m2} (ref >59)
Globulin, Total: 1.9 g/dL (ref 1.5–4.5)
Glucose: 79 mg/dL (ref 65–99)
Potassium: 4.6 mmol/L (ref 3.5–5.2)
Sodium: 140 mmol/L (ref 134–144)
Total Protein: 6.2 g/dL (ref 6.0–8.5)

## 2020-02-02 LAB — CBC WITH DIFFERENTIAL/PLATELET
Basophils Absolute: 0.1 10*3/uL (ref 0.0–0.2)
Basos: 1 %
EOS (ABSOLUTE): 0.3 10*3/uL (ref 0.0–0.4)
Eos: 3 %
Hematocrit: 41.2 % (ref 34.0–46.6)
Hemoglobin: 13.5 g/dL (ref 11.1–15.9)
Immature Grans (Abs): 0 10*3/uL (ref 0.0–0.1)
Immature Granulocytes: 1 %
Lymphocytes Absolute: 2.5 10*3/uL (ref 0.7–3.1)
Lymphs: 30 %
MCH: 29.3 pg (ref 26.6–33.0)
MCHC: 32.8 g/dL (ref 31.5–35.7)
MCV: 89 fL (ref 79–97)
Monocytes Absolute: 0.5 10*3/uL (ref 0.1–0.9)
Monocytes: 7 %
Neutrophils Absolute: 4.9 10*3/uL (ref 1.4–7.0)
Neutrophils: 58 %
Platelets: 266 10*3/uL (ref 150–450)
RBC: 4.61 x10E6/uL (ref 3.77–5.28)
RDW: 12.4 % (ref 11.7–15.4)
WBC: 8.3 10*3/uL (ref 3.4–10.8)

## 2020-02-02 LAB — IRON AND TIBC
Iron Saturation: 39 % (ref 15–55)
Iron: 112 ug/dL (ref 27–159)
Total Iron Binding Capacity: 287 ug/dL (ref 250–450)
UIBC: 175 ug/dL (ref 131–425)

## 2020-02-02 LAB — LIPID PANEL W/O CHOL/HDL RATIO
Cholesterol, Total: 174 mg/dL (ref 100–199)
HDL: 72 mg/dL (ref >39)
LDL Chol Calc (NIH): 89 mg/dL (ref 0–99)
Triglycerides: 71 mg/dL (ref 0–149)
VLDL Cholesterol Cal: 13 mg/dL (ref 5–40)

## 2020-02-02 LAB — TSH: TSH: 7.5 u[IU]/mL — ABNORMAL HIGH (ref 0.450–4.500)

## 2020-02-02 LAB — HIV ANTIBODY (ROUTINE TESTING W REFLEX): HIV Screen 4th Generation wRfx: NONREACTIVE

## 2020-02-02 LAB — FERRITIN: Ferritin: 75 ng/mL (ref 15–150)

## 2020-02-02 LAB — VITAMIN B12: Vitamin B-12: 442 pg/mL (ref 232–1245)

## 2020-02-03 LAB — URINE CULTURE, REFLEX

## 2020-02-03 LAB — UA/M W/RFLX CULTURE, ROUTINE
Bilirubin, UA: NEGATIVE
Glucose, UA: NEGATIVE
Ketones, UA: NEGATIVE
Nitrite, UA: NEGATIVE
Protein,UA: NEGATIVE
RBC, UA: NEGATIVE
Specific Gravity, UA: 1.01 (ref 1.005–1.030)
Urobilinogen, Ur: 0.2 mg/dL (ref 0.2–1.0)
pH, UA: 6.5 (ref 5.0–7.5)

## 2020-02-03 LAB — MICROSCOPIC EXAMINATION: RBC, Urine: NONE SEEN /hpf (ref 0–2)

## 2020-02-05 ENCOUNTER — Encounter: Payer: Self-pay | Admitting: Physical Therapy

## 2020-02-13 ENCOUNTER — Ambulatory Visit: Payer: BC Managed Care – PPO | Admitting: Physical Therapy

## 2020-02-13 ENCOUNTER — Ambulatory Visit: Payer: BC Managed Care – PPO | Attending: Family Medicine | Admitting: Physical Therapy

## 2020-02-21 ENCOUNTER — Encounter: Payer: Self-pay | Admitting: Physical Therapy

## 2020-02-22 ENCOUNTER — Inpatient Hospital Stay: Payer: BC Managed Care – PPO | Attending: Hematology and Oncology

## 2020-03-06 ENCOUNTER — Encounter: Payer: Self-pay | Admitting: Physical Therapy

## 2020-03-20 ENCOUNTER — Encounter: Payer: Self-pay | Admitting: Physical Therapy

## 2020-03-21 ENCOUNTER — Inpatient Hospital Stay: Payer: BC Managed Care – PPO | Attending: Hematology and Oncology

## 2020-03-24 ENCOUNTER — Emergency Department
Admission: EM | Admit: 2020-03-24 | Discharge: 2020-03-24 | Disposition: A | Discharge status: Home / Self Care | Payer: BC Managed Care – PPO | Attending: Emergency Medicine | Admitting: Emergency Medicine

## 2020-03-24 ENCOUNTER — Emergency Department: Payer: BC Managed Care – PPO

## 2020-03-24 ENCOUNTER — Other Ambulatory Visit: Payer: Self-pay

## 2020-03-24 DIAGNOSIS — S61211A Laceration without foreign body of left index finger without damage to nail, initial encounter: Secondary | ICD-10-CM | POA: Diagnosis not present

## 2020-03-24 DIAGNOSIS — Y998 Other external cause status: Secondary | ICD-10-CM | POA: Insufficient documentation

## 2020-03-24 DIAGNOSIS — Y9239 Other specified sports and athletic area as the place of occurrence of the external cause: Secondary | ICD-10-CM | POA: Insufficient documentation

## 2020-03-24 DIAGNOSIS — Z79899 Other long term (current) drug therapy: Secondary | ICD-10-CM | POA: Diagnosis not present

## 2020-03-24 DIAGNOSIS — E039 Hypothyroidism, unspecified: Secondary | ICD-10-CM | POA: Insufficient documentation

## 2020-03-24 DIAGNOSIS — W278XXA Contact with other nonpowered hand tool, initial encounter: Secondary | ICD-10-CM | POA: Diagnosis not present

## 2020-03-24 DIAGNOSIS — Y93E5 Activity, floor mopping and cleaning: Secondary | ICD-10-CM | POA: Diagnosis not present

## 2020-03-24 DIAGNOSIS — M79645 Pain in left finger(s): Secondary | ICD-10-CM | POA: Diagnosis not present

## 2020-03-24 DIAGNOSIS — S6992XA Unspecified injury of left wrist, hand and finger(s), initial encounter: Secondary | ICD-10-CM | POA: Diagnosis not present

## 2020-03-24 MED ORDER — TETANUS-DIPHTH-ACELL PERTUSSIS 5-2.5-18.5 LF-MCG/0.5 IM SUSP
0.5000 mL | Freq: Once | INTRAMUSCULAR | Status: AC
  Administered 2020-03-24: 0.5 mL via INTRAMUSCULAR
  Filled 2020-03-24: qty 0.5

## 2020-03-24 MED ORDER — LIDOCAINE HCL (PF) 1 % IJ SOLN
5.0000 mL | Freq: Once | INTRAMUSCULAR | Status: AC
  Administered 2020-03-24: 5 mL via INTRADERMAL
  Filled 2020-03-24: qty 5

## 2020-03-24 MED ORDER — LIDOCAINE HCL (PF) 1 % IJ SOLN
5.0000 mL | Freq: Once | INTRAMUSCULAR | Status: DC
  Filled 2020-03-24: qty 5

## 2020-03-24 NOTE — ED Notes (Signed)
Finger dressed with nonstick dressing, wound care discussed with pt who verbalizes understanding.

## 2020-03-24 NOTE — Discharge Instructions (Addendum)
Keep the area as dry as possible.  Wash with soap and water after 24 hours.  When at home the area is not draining please allow it to get air to heal faster.  Suture should be removed in 1 week.  You may return the emergency department, see your regular doctor, or have a friend remove the stitches.

## 2020-03-24 NOTE — ED Triage Notes (Signed)
Pt comes POV with left first finger lac after putting down laminate flooring. Bleeding controlled at this time. Top of finger is numb.

## 2020-03-24 NOTE — ED Provider Notes (Signed)
[facility] Emergency Department Provider Note  ____________________________________________   First MD Initiated Contact with Patient 03/24/20 1735     (approximate)  I have reviewed the triage vital signs and the nursing notes.   HISTORY  Chief Complaint Laceration    HPI Sharon Gibson is a 39 y.o. female presents emergency department laceration to the left index finger.  Patient was using a rubber mallet to lay down laminate floor.  Hit her finger and flatten out.  Causing a small laceration to the volar surface of the index finger.  Unsure of her last tetanus shot.   Pain rated at 9/10   Past Medical History:  Diagnosis Date  . Anemia   . Anxiety   . Chicken pox   . Depression   . Eating disorder    Has had residential treatment and hospitalization previously.  Marland Kitchen Heart murmur    as a child  . History of anemia    RECIEVES IRON INFUSIONS YEARLY  . History of pulmonary embolus (PE) 2002   bilaterally-followed by [facility] hematology-w/u negative per pt  . Hypothyroidism    no meds currently  . PTSD (post-traumatic stress disorder)     Patient Active Problem List   Diagnosis Date Noted  . Cervical strain 11/28/2019  . Endometriosis determined by laparoscopy 06/13/2018  . Ovarian cyst 03/01/2018  . Hx pulmonary embolism 02/03/2018  . Generalized anxiety disorder 10/12/2016  . Right knee pain 09/02/2016  . Severe recurrent major depression without psychotic features (HCC) 07/23/2016  . Bulimia nervosa, in partial remission, mild 07/23/2016    Class: Chronic  . Iron deficiency anemia 04/08/2016  . B12 deficiency 04/08/2016  . S/P gastric bypass 03/20/2016  . Eating disorder 03/20/2016  . Insomnia 03/20/2016  . PTSD (post-traumatic stress disorder) 03/20/2016  . Hypothyroidism 03/20/2016  . PCOS (polycystic ovarian syndrome) 12/14/2014    Past Surgical History:  Procedure Laterality Date  . CHOLECYSTECTOMY  2003  . EXCISION OF  ENDOMETRIOMA    . GASTRIC BYPASS  2003  . LAPAROSCOPIC OVARIAN CYSTECTOMY Left 05/10/2018   Procedure: LAPAROSCOPIC OVARIAN CYSTECTOMY;  Surgeon: Natale Milch, MD;  Location: [facility] ORS;  Service: Gynecology;  Laterality: Left;    Prior to Admission medications   Medication Sig Start Date End Date Taking? Authorizing Provider  buPROPion (WELLBUTRIN SR) 150 MG 12 hr tablet 1 tab qAM x 3-4 days, then 1 tab BID after that 11/02/19   Olevia Perches P, DO  clonazePAM (KLONOPIN) 0.5 MG tablet Take 1 tablet (0.5 mg total) by mouth 2 (two) times daily as needed for anxiety. 02/01/20 03/02/20  Johnson, Megan P, DO  clonazePAM (KLONOPIN) 0.5 MG tablet Take 1 tablet (0.5 mg total) by mouth 2 (two) times daily as needed for anxiety. 04/01/20 05/01/20  Johnson, Megan P, DO  clonazePAM (KLONOPIN) 0.5 MG tablet Take 1 tablet (0.5 mg total) by mouth 2 (two) times daily as needed for anxiety. 03/02/20 04/01/20  Olevia Perches P, DO  cyclobenzaprine (FLEXERIL) 10 MG tablet Take 0.5-1 tablets (5-10 mg total) by mouth 3 (three) times daily. 02/01/20   Johnson, Megan P, DO  diclofenac Sodium (VOLTAREN) 1 % GEL Apply 2 g topically 4 (four) times daily as needed (neck/shoulder pain). 11/28/19   Mardene Celeste I, NP  levonorgestrel (MIRENA) 20 MCG/24HR IUD 1 each by Intrauterine route once.    [provider]  levothyroxine (SYNTHROID) 75 MCG tablet Take 1 tablet (75 mcg total) by mouth daily. 11/02/19   Olevia Perches  P, DO  Multiple Vitamin (MULTIVITAMIN) tablet Take 1 tablet by mouth daily.    [provider]  sertraline (ZOLOFT) 100 MG tablet Take 2 tablets (200 mg total) by mouth at bedtime. 11/02/19 01/31/20  Olevia Perches P, DO    Allergies Penicillins, Azithromycin, Other, and Strawberry (diagnostic)  Family History  Problem Relation Age of Onset  . Diabetes Father   . Anxiety disorder Father   . Prostate cancer Paternal Grandfather   . Alzheimer's disease Maternal Grandmother   . Alzheimer's disease  Maternal Grandfather   . Lung cancer Paternal Grandmother     Social History Social History   Tobacco Use  . Smoking status: Never Smoker  . Smokeless tobacco: Never Used  Vaping Use  . Vaping Use: Never used  Substance Use Topics  . Alcohol use: Yes    Alcohol/week: 0.0 - 1.0 standard drinks    Comment: rare  . Drug use: No    Review of Systems  Constitutional: No fever/chills Eyes: No visual changes. ENT: No sore throat. Respiratory: Denies cough Genitourinary: Negative for dysuria. Musculoskeletal: Negative for back pain.  Laceration to left index finger Skin: Negative for rash. Psychiatric: no mood changes,     ____________________________________________   PHYSICAL EXAM:  VITAL SIGNS: ED Triage Vitals  Enc Vitals Group     BP 03/24/20 1726 135/76     Pulse Rate 03/24/20 1726 90     Resp 03/24/20 1726 18     Temp 03/24/20 1726 98.8 F (37.1 C)     Temp Source 03/24/20 1726 Oral     SpO2 03/24/20 1726 97 %     Weight 03/24/20 1725 270 lb (122.5 kg)     Height 03/24/20 1725 5\' 3"  (1.6 m)     Head Circumference --      Peak Flow --      Pain Score 03/24/20 1725 9     Pain Loc --      Pain Edu? --      Excl. in GC? --     Constitutional: Alert and oriented. Well appearing and in no acute distress. Eyes: Conjunctivae are normal.  Head: Atraumatic. Nose: No congestion/rhinnorhea. Mouth/Throat: Mucous membranes are moist.   Neck:  supple no lymphadenopathy noted Cardiovascular: Normal rate, regular rhythm. Respiratory: Normal respiratory effort.  No retractions, GU: deferred Musculoskeletal: FROM all extremities, warm and well perfused, left index finger is tender, no foreign body noted, small laceration noted on volar surface Neurologic:  Normal speech and language.  Skin:  Skin is warm, dry . No rash noted. Psychiatric: Mood and affect are normal. Speech and behavior are normal.  ____________________________________________   LABS (all labs  ordered are listed, but only abnormal results are displayed)  Labs Reviewed - No data to display ____________________________________________   ____________________________________________  RADIOLOGY  X-ray of the left index finger does not show foreign body or fracture  ____________________________________________   PROCEDURES  Procedure(s) performed:   06/29/21Marland KitchenLaceration Repair  Date/Time: 03/24/2020 8:05 PM Performed by: 03/26/2020, PA-C Authorized by: Faythe Ghee, PA-C   Consent:    Consent obtained:  Verbal   Consent given by:  Patient   Risks discussed:  Infection, retained foreign body, tendon damage and poor wound healing Anesthesia (see MAR for exact dosages):    Anesthesia method:  Nerve block   Block needle gauge:  25 G   Block anesthetic:  Lidocaine 1% w/o epi   Block injection procedure:  Anatomic landmarks identified, introduced needle,  incremental injection, anatomic landmarks palpated and negative aspiration for blood   Block outcome:  Anesthesia achieved Laceration details:    Location:  Finger   Finger location:  L index finger   Length (cm):  0.5 Repair type:    Repair type:  Simple Pre-procedure details:    Preparation:  Patient was prepped and draped in usual sterile fashion Exploration:    Hemostasis achieved with:  Direct pressure   Wound exploration: wound explored through full range of motion     Wound extent: no foreign bodies/material noted, no muscle damage noted and no underlying fracture noted   Treatment:    Area cleansed with:  Betadine and saline   Amount of cleaning:  Standard   Irrigation solution:  Sterile saline   Irrigation method:  Tap Skin repair:    Repair method:  Sutures   Suture size:  5-0   Suture material:  Nylon   Suture technique:  Simple interrupted   Number of sutures:  2 Approximation:    Approximation:  Close Post-procedure details:    Dressing:  Non-adherent dressing   Patient tolerance of procedure:   Tolerated well, no immediate complications Comments:     Sutures performed by Nemiah Commander      ____________________________________________   INITIAL IMPRESSION / ASSESSMENT AND PLAN / ED COURSE  Pertinent labs & imaging results that were available during my care of the patient were reviewed by me and considered in my medical decision making (see chart for details).   39 year old female presents emergency department laceration to the left index finger.  See HPI physical exam shows a small laceration on the volar surface, x-ray does not show a fracture or foreign body.  See procedure note for repair.  Tdap was updated here in the ED  The patient tolerated procedure well.  Dressing was applied by nursing staff.  She is discharged stable condition with instructions to have sutures removed in 1 week.  Signs of infection were discussed with patient.  If she sees any of these she should follow-up with her doctor or return emergency department.  She states she understands.  She is discharged stable condition.    Sharon Gibson was evaluated in Emergency Department on 03/24/2020 for the symptoms described in the history of present illness. She was evaluated in the context of the global COVID-19 pandemic, which necessitated consideration that the patient might be at risk for infection with the SARS-CoV-2 virus that causes COVID-19. Institutional protocols and algorithms that pertain to the evaluation of patients at risk for COVID-19 are in a state of rapid change based on information released by regulatory bodies including the CDC and federal and state organizations. These policies and algorithms were followed during the patient's care in the ED.   As part of my medical decision making, I reviewed the following data within the electronic MEDICAL RECORD NUMBER Nursing notes reviewed and incorporated, Old chart reviewed, Radiograph reviewed , Notes from prior ED visits and [place] Controlled Substance  Database  ____________________________________________   FINAL CLINICAL IMPRESSION(S) / ED DIAGNOSES  Final diagnoses:  Laceration of left index finger w/o foreign body w/o damage to nail, initial encounter      NEW MEDICATIONS STARTED DURING THIS VISIT:  Discharge Medication List as of 03/24/2020  7:43 PM       Note:  This document was prepared using Dragon voice recognition software and may include unintentional dictation errors.    Faythe Ghee, PA-C 03/24/20 2008    Larinda Buttery,  Juanda Crumble, MD 03/25/20 [phone]

## 2020-03-24 NOTE — ED Notes (Signed)
Pt presents to the ED after laying down laminate flooring and hit her finger between the rubber mallet and laminate floor. Blood controlled at this time. Small break in the skin on the L index finger and what looks like a deep splinter. Pt is A&Ox4 and NAD. Pt able to bend finger but states that the finger is numb at this time.

## 2020-04-08 ENCOUNTER — Telehealth (INDEPENDENT_AMBULATORY_CARE_PROVIDER_SITE_OTHER): Payer: BC Managed Care – PPO | Admitting: Family Medicine

## 2020-04-08 ENCOUNTER — Encounter: Payer: Self-pay | Admitting: Family Medicine

## 2020-04-08 VITALS — Temp 98.4°F

## 2020-04-08 DIAGNOSIS — J029 Acute pharyngitis, unspecified: Secondary | ICD-10-CM | POA: Diagnosis not present

## 2020-04-08 DIAGNOSIS — R5383 Other fatigue: Secondary | ICD-10-CM | POA: Diagnosis not present

## 2020-04-08 DIAGNOSIS — J3489 Other specified disorders of nose and nasal sinuses: Secondary | ICD-10-CM | POA: Diagnosis not present

## 2020-04-08 NOTE — Progress Notes (Signed)
Temp 98.4 F (36.9 C) (Oral)    Subjective:    Patient ID: Sharon Gibson, female    DOB: August 02, 1981, 39 y.o.   MRN: [id number]  HPI: Sharon Gibson is a 39 y.o. female  Chief Complaint  Patient presents with  . Sore Throat    pt states she has had a sore/scratchy throat, runny nose, and tender neck since Saturday  . Wound Check    Patient states she went to the ER 2 weeks ago for a laceration on her finger, states she took her sutures out last Sunday.    . This visit was completed via MyChart due to the restrictions of the COVID-19 pandemic. All issues as above were discussed and addressed. Physical exam was done as above through visual confirmation on MyChart. If it was felt that the patient should be evaluated in the office, they were directed there. The patient verbally consented to this visit. . Location of the patient: home . Location of the provider: work . Those involved with this call:  . Provider: Roosvelt Maser, PA-C . CMA: Elton Sin, CMA . Front Desk/Registration: Harriet Pho  . Time spent on call: 15 minutes with patient face to face via video conference. More than 50% of this time was spent in counseling and coordination of care. 5 minutes total spent in review of patient's record and preparation of their chart. I verified patient identity using two factors (patient name and date of birth). Patient consents verbally to being seen via telemedicine visit today.   Presenting today for sore throat, rhinorrhea, mild headache, fatigue, and now right neck lymphadenopathy the past 3 days. Denies fever, chills, cough, CP, SOB, wheezing, sick contacts. Not trying anything for sxs.   Relevant past medical, surgical, family and social history reviewed and updated as indicated. Interim medical history since our last visit reviewed. Allergies and medications reviewed and updated.  Review of Systems  Per HPI unless specifically indicated above     Objective:    Temp  98.4 F (36.9 C) (Oral)   Wt Readings from Last 3 Encounters:  03/24/20 270 lb (122.5 kg)  02/01/20 286 lb 2 oz (129.8 kg)  11/15/19 274 lb (124.3 kg)    Physical Exam Vitals and nursing note reviewed.  Constitutional:      General: She is not in acute distress.    Appearance: Normal appearance.  HENT:     Head: Atraumatic.     Right Ear: External ear normal.     Left Ear: External ear normal.     Nose: Nose normal. No congestion.     Mouth/Throat:     Mouth: Mucous membranes are moist.     Pharynx: Oropharynx is clear. Posterior oropharyngeal erythema present.  Eyes:     Extraocular Movements: Extraocular movements intact.     Conjunctiva/sclera: Conjunctivae normal.  Cardiovascular:     Comments: Unable to assess via virtual visit Pulmonary:     Effort: Pulmonary effort is normal. No respiratory distress.  Musculoskeletal:        General: Normal range of motion.     Cervical back: Normal range of motion.  Skin:    General: Skin is dry.     Findings: No erythema.  Neurological:     Mental Status: She is alert and oriented to person, place, and time.  Psychiatric:        Mood and Affect: Mood normal.        Thought Content: Thought content normal.  Judgment: Judgment normal.     Results for orders placed or performed in visit on 02/01/20  Microscopic Examination   URINE  Result Value Ref Range   WBC, UA 0-5 0 - 5 /hpf   RBC None seen 0 - 2 /hpf   Epithelial Cells (non renal) 0-10 0 - 10 /hpf   Bacteria, UA Few (A) None seen/Few  Urine Culture, Reflex   URINE  Result Value Ref Range   Urine Culture, Routine Final report    Organism ID, Bacteria Comment   HIV Antibody (routine testing w rflx)  Result Value Ref Range   HIV Screen 4th Generation wRfx Non Reactive Non Reactive  CBC with Differential/Platelet  Result Value Ref Range   WBC 8.3 3.4 - 10.8 x10E3/uL   RBC 4.61 3.77 - 5.28 x10E6/uL   Hemoglobin 13.5 11.1 - 15.9 g/dL   Hematocrit 03.2 12.2 -  46.6 %   MCV 89 79 - 97 fL   MCH 29.3 26.6 - 33.0 pg   MCHC 32.8 31 - 35 g/dL   RDW 48.2 50.0 - 37.0 %   Platelets 266 150 - 450 x10E3/uL   Neutrophils 58 Not Estab. %   Lymphs 30 Not Estab. %   Monocytes 7 Not Estab. %   Eos 3 Not Estab. %   Basos 1 Not Estab. %   Neutrophils Absolute 4.9 1 - 7 x10E3/uL   Lymphocytes Absolute 2.5 0 - 3 x10E3/uL   Monocytes Absolute 0.5 0 - 0 x10E3/uL   EOS (ABSOLUTE) 0.3 0.0 - 0.4 x10E3/uL   Basophils Absolute 0.1 0 - 0 x10E3/uL   Immature Granulocytes 1 Not Estab. %   Immature Grans (Abs) 0.0 0.0 - 0.1 x10E3/uL  Comprehensive metabolic panel  Result Value Ref Range   Glucose 79 65 - 99 mg/dL   BUN 19 6 - 20 mg/dL   Creatinine, Ser 4.88 0.57 - 1.00 mg/dL   GFR calc non Af Amer 111 >59 mL/min/1.73   GFR calc Af Amer 127 >59 mL/min/1.73   BUN/Creatinine Ratio 28 (H) 9 - 23   Sodium 140 134 - 144 mmol/L   Potassium 4.6 3.5 - 5.2 mmol/L   Chloride 106 96 - 106 mmol/L   CO2 21 20 - 29 mmol/L   Calcium 9.5 8.7 - 10.2 mg/dL   Total Protein 6.2 6.0 - 8.5 g/dL   Albumin 4.3 3.8 - 4.8 g/dL   Globulin, Total 1.9 1.5 - 4.5 g/dL   Albumin/Globulin Ratio 2.3 (H) 1.2 - 2.2   Bilirubin Total 0.3 0.0 - 1.2 mg/dL   Alkaline Phosphatase 91 39 - 117 IU/L   AST 15 0 - 40 IU/L   ALT 14 0 - 32 IU/L  Lipid Panel w/o Chol/HDL Ratio  Result Value Ref Range   Cholesterol, Total 174 100 - 199 mg/dL   Triglycerides 71 0 - 149 mg/dL   HDL 72 >89 mg/dL   VLDL Cholesterol Cal 13 5 - 40 mg/dL   LDL Chol Calc (NIH) 89 0 - 99 mg/dL  TSH  Result Value Ref Range   TSH 7.500 (H) 0.450 - 4.500 uIU/mL  UA/M w/rflx Culture, Routine   Specimen: Urine   URINE  Result Value Ref Range   Specific Gravity, UA 1.010 1.005 - 1.030   pH, UA 6.5 5.0 - 7.5   Color, UA Yellow Yellow   Appearance Ur Clear Clear   Leukocytes,UA Trace (A) Negative   Protein,UA Negative Negative/Trace   Glucose, UA Negative Negative  Ketones, UA Negative Negative   RBC, UA Negative Negative    Bilirubin, UA Negative Negative   Urobilinogen, Ur 0.2 0.2 - 1.0 mg/dL   Nitrite, UA Negative Negative   Microscopic Examination See below:    Urinalysis Reflex Comment   Iron and TIBC  Result Value Ref Range   Total Iron Binding Capacity 287 250 - 450 ug/dL   UIBC 761 607 - 371 ug/dL   Iron 062 27 - 694 ug/dL   Iron Saturation 39 15 - 55 %  Ferritin  Result Value Ref Range   Ferritin 75 15.0 - 150.0 ng/mL  B12  Result Value Ref Range   Vitamin B-12 442 232 - 1,245 pg/mL      Assessment & Plan:   Problem List Items Addressed This Visit    None    Visit Diagnoses    Fatigue, unspecified type    -  Primary   Suspect viral illness, supportive care and return precautions reviewed. Isolate at home until sxs resolve   Rhinorrhea       Sore throat           Follow up plan: Return if symptoms worsen or fail to improve.

## 2020-04-23 ENCOUNTER — Other Ambulatory Visit: Payer: BC Managed Care – PPO

## 2020-04-24 ENCOUNTER — Ambulatory Visit: Payer: BC Managed Care – PPO | Admitting: Hematology and Oncology

## 2020-04-24 ENCOUNTER — Ambulatory Visit: Payer: BC Managed Care – PPO

## 2020-05-06 ENCOUNTER — Other Ambulatory Visit: Payer: Self-pay

## 2020-05-06 DIAGNOSIS — E538 Deficiency of other specified B group vitamins: Secondary | ICD-10-CM

## 2020-05-07 ENCOUNTER — Inpatient Hospital Stay: Payer: BC Managed Care – PPO | Attending: Hematology and Oncology

## 2020-05-07 ENCOUNTER — Other Ambulatory Visit: Payer: Self-pay

## 2020-05-07 DIAGNOSIS — E538 Deficiency of other specified B group vitamins: Secondary | ICD-10-CM

## 2020-05-07 DIAGNOSIS — D509 Iron deficiency anemia, unspecified: Secondary | ICD-10-CM | POA: Insufficient documentation

## 2020-05-07 LAB — CBC WITH DIFFERENTIAL/PLATELET
Abs Immature Granulocytes: 0.03 10*3/uL (ref 0.00–0.07)
Basophils Absolute: 0.1 10*3/uL (ref 0.0–0.1)
Basophils Relative: 1 %
Eosinophils Absolute: 0.2 10*3/uL (ref 0.0–0.5)
Eosinophils Relative: 3 %
HCT: 40 % (ref 36.0–46.0)
Hemoglobin: 13.2 g/dL (ref 12.0–15.0)
Immature Granulocytes: 0 %
Lymphocytes Relative: 24 %
Lymphs Abs: 1.9 10*3/uL (ref 0.7–4.0)
MCH: 29.1 pg (ref 26.0–34.0)
MCHC: 33 g/dL (ref 30.0–36.0)
MCV: 88.1 fL (ref 80.0–100.0)
Monocytes Absolute: 0.5 10*3/uL (ref 0.1–1.0)
Monocytes Relative: 6 %
Neutro Abs: 5.2 10*3/uL (ref 1.7–7.7)
Neutrophils Relative %: 66 %
Platelets: 285 10*3/uL (ref 150–400)
RBC: 4.54 MIL/uL (ref 3.87–5.11)
RDW: 12.8 % (ref 11.5–15.5)
WBC: 7.9 10*3/uL (ref 4.0–10.5)
nRBC: 0 % (ref 0.0–0.2)

## 2020-05-07 LAB — IRON AND TIBC
Iron: 97 ug/dL (ref 28–170)
Saturation Ratios: 33 % — ABNORMAL HIGH (ref 10.4–31.8)
TIBC: 294 ug/dL (ref 250–450)
UIBC: 197 ug/dL

## 2020-05-07 LAB — FERRITIN: Ferritin: 46 ng/mL (ref 11–307)

## 2020-05-07 NOTE — Progress Notes (Signed)
[facility]  [address] [place], [place] [address] Phone: [phone]  Fax: [phone]   Clinic Day:  05/08/2020  Referring physician: Valerie Roys, DO  Chief Complaint: Sharon Gibson is a 39 y.o. female with iron and B12 deficiency who is seen for 6 month assessment.   HPI: The patient was last seen in the hematology clinic on 10/25/2019. At that time, she was fatigued.  Exam was stable. Hematocrit was 42.0, hemoglobin 13.9, platelets 265,000, WBC 9,000. Ferritin was 16 with an iron saturation of 29% and a TIBC of 342. TSH was 6.731 and free T4 was 0.78. Folate was 14.1. Vitamin B12 was 231. She received Feraheme.   The patient received Vitamin B12 injections on 11/02/2019 and 01/23/2020.  Labs followed: 01/23/2020: Hematocrit 42.8, hemoglobin 14.0, platelets 282,000, WBC 7,800. Ferritin 47. 02/01/2020: Hematocrit 41.2, hemoglobin 13.5, platelets 266,000, WBC 8,300. Ferritin 75. Iron saturation 39%. TIBC 287.  During the interim, she has been okay. She is still tired and has less energy than she would like. At her last visit, she was exercising frequently. During the interim, she suffered a neck injury and is struggling to get back into a workout routine. She now has trouble getting through the same workouts she was doing before. Ice pica has resolved. She still has restless legs.  The patient is interested in doing Vitamin B12 injections at home.   Past Medical History:  Diagnosis Date  . Anemia   . Anxiety   . Chicken pox   . Depression   . Eating disorder    Has had residential treatment and hospitalization previously.  Marland Kitchen Heart murmur    as a child  . History of anemia    RECIEVES IRON INFUSIONS YEARLY  . History of pulmonary embolus (PE) 2002   bilaterally-followed by [facility] hematology-w/u negative per pt  . Hypothyroidism    no meds currently  . PTSD (post-traumatic stress disorder)     Past Surgical History:   Procedure Laterality Date  . CHOLECYSTECTOMY  2003  . EXCISION OF ENDOMETRIOMA    . GASTRIC BYPASS  2003  . LAPAROSCOPIC OVARIAN CYSTECTOMY Left 05/10/2018   Procedure: LAPAROSCOPIC OVARIAN CYSTECTOMY;  Surgeon: Homero Fellers, MD;  Location: [facility] ORS;  Service: Gynecology;  Laterality: Left;    Family History  Problem Relation Age of Onset  . Diabetes Father   . Anxiety disorder Father   . Prostate cancer Paternal Grandfather   . Alzheimer's disease Maternal Grandmother   . Alzheimer's disease Maternal Grandfather   . Lung cancer Paternal Grandmother     Social History:  reports that she has never smoked. She has never used smokeless tobacco. She reports current alcohol use. She reports that she does not use drugs. She has 1 alcohol beverage per week. She is single, but sexually active and is currently on IUD Mirena for birth control.  The patient is alone today.  Allergies:  Allergies  Allergen Reactions  . Penicillins Hives    Has patient had a PCN reaction causing immediate rash, facial/tongue/throat swelling, SOB or lightheadedness with hypotension: yes Has patient had a PCN reaction causing severe rash involving mucus membranes or skin necrosis: no Has patient had a PCN reaction that required hospitalization: yes Has patient had a PCN reaction occurring within the last 10 years: no If all of the above answers are "NO", then may proceed with Cephalosporin use.   . Azithromycin   . Other     Narcotics- Burning  in the stomach (whether PO or IV)  . Strawberry (Diagnostic) Swelling    AND WALNUTS-TONGUE SWELLING    Current Medications: Current Outpatient Medications  Medication Sig Dispense Refill  . buPROPion (WELLBUTRIN SR) 150 MG 12 hr tablet 1 tab qAM x 3-4 days, then 1 tab BID after that 180 tablet 1  . cyclobenzaprine (FLEXERIL) 10 MG tablet Take 0.5-1 tablets (5-10 mg total) by mouth 3 (three) times daily. 90 tablet 3  . levonorgestrel (MIRENA) 20 MCG/24HR  IUD 1 each by Intrauterine route once.    Marland Kitchen levothyroxine (SYNTHROID) 75 MCG tablet Take 1 tablet (75 mcg total) by mouth daily. 30 tablet 2  . Multiple Vitamin (MULTIVITAMIN) tablet Take 1 tablet by mouth daily.    . clonazePAM (KLONOPIN) 0.5 MG tablet Take 1 tablet (0.5 mg total) by mouth 2 (two) times daily as needed for anxiety. 60 tablet 0  . diclofenac Sodium (VOLTAREN) 1 % GEL Apply 2 g topically 4 (four) times daily as needed (neck/shoulder pain). (Patient not taking: Reported on 05/08/2020) 50 g 0  . sertraline (ZOLOFT) 100 MG tablet Take 2 tablets (200 mg total) by mouth at bedtime. 180 tablet 1   No current facility-administered medications for this visit.    Review of Systems  Constitutional: Positive for malaise/fatigue (less energy). Negative for chills and fever.       Feels good.  HENT: Negative.  Negative for congestion, ear discharge, ear pain, hearing loss, nosebleeds, sinus pain, sore throat and tinnitus.   Eyes: Negative.  Negative for blurred vision, double vision, photophobia and pain.  Respiratory: Negative.  Negative for cough, hemoptysis, sputum production and shortness of breath.   Cardiovascular: Negative.  Negative for chest pain, palpitations and leg swelling.  Gastrointestinal: Negative.  Negative for abdominal pain, blood in stool, constipation, diarrhea, heartburn, melena, nausea and vomiting.       Denies ice pica.  Genitourinary: Negative.  Negative for dysuria, frequency, hematuria and urgency.  Musculoskeletal: Negative.  Negative for back pain, joint pain, myalgias and neck pain.  Skin: Negative.  Negative for itching and rash.  Neurological: Negative for dizziness, tingling, sensory change, speech change, focal weakness, weakness and headaches.       Restless legs.  Endo/Heme/Allergies: Negative.  Negative for environmental allergies. Does not bruise/bleed easily.  Psychiatric/Behavioral: Negative.  Negative for depression and memory loss. The patient is  not nervous/anxious and does not have insomnia.   All other systems reviewed and are negative.  Performance status (ECOG): 1  Vitals Blood pressure 101/66, pulse 97, temperature (!) 97.1 F (36.2 C), temperature source Tympanic, resp. rate 18, weight (!) 304 lb 3.8 oz (138 kg), SpO2 98 %.   Physical Exam Vitals and nursing note reviewed.  Constitutional:      General: She is not in acute distress.    Appearance: She is well-developed. She is not diaphoretic.  HENT:     Head: Normocephalic and atraumatic.     Mouth/Throat:     Mouth: Mucous membranes are moist.     Pharynx: Oropharynx is clear. No oropharyngeal exudate.  Eyes:     General: No scleral icterus.    Conjunctiva/sclera: Conjunctivae normal.     Pupils: Pupils are equal, round, and reactive to light.     Comments: Glasses.  Blue eyes.  Neck:     Vascular: No JVD.  Cardiovascular:     Rate and Rhythm: Normal rate and regular rhythm.     Heart sounds: Normal heart sounds. No murmur  heard.   Pulmonary:     Effort: Pulmonary effort is normal. No respiratory distress.     Breath sounds: Normal breath sounds. No wheezing or rales.  Chest:     Chest wall: No tenderness.  Abdominal:     General: Bowel sounds are normal. There is no distension.     Palpations: Abdomen is soft. There is no mass.     Tenderness: There is no abdominal tenderness. There is no guarding or rebound.  Musculoskeletal:        General: No swelling or tenderness. Normal range of motion.     Cervical back: Normal range of motion and neck supple.  Lymphadenopathy:     Head:     Right side of head: No preauricular, posterior auricular or occipital adenopathy.     Left side of head: No preauricular, posterior auricular or occipital adenopathy.     Cervical: No cervical adenopathy.     Upper Body:     Right upper body: No supraclavicular adenopathy.     Left upper body: No supraclavicular adenopathy.     Lower Body: No right inguinal adenopathy. No  left inguinal adenopathy.  Skin:    General: Skin is warm and dry.     Coloration: Skin is not pale.     Findings: No erythema or rash.     Comments: Nail ridging.  Neurological:     Mental Status: She is alert and oriented to person, place, and time.  Psychiatric:        Behavior: Behavior normal.        Thought Content: Thought content normal.        Judgment: Judgment normal.    Appointment on 05/07/2020  Component Date Value Ref Range Status  . Iron 05/07/2020 97  28 - 170 ug/dL Final  . TIBC 05/07/2020 294  250 - 450 ug/dL Final  . Saturation Ratios 05/07/2020 33* 10.4 - 31.8 % Final  . UIBC 05/07/2020 197  ug/dL Final   Performed at [facility], [address], [place], [place] [address]  . Ferritin 05/07/2020 46  11 - 307 ng/mL Final   Performed at [facility], [address] [place], [place] [address]  . WBC 05/07/2020 7.9  4.0 - 10.5 K/uL Final  . RBC 05/07/2020 4.54  3.87 - 5.11 MIL/uL Final  . Hemoglobin 05/07/2020 13.2  12.0 - 15.0 g/dL Final  . HCT 05/07/2020 40.0  36 - 46 % Final  . MCV 05/07/2020 88.1  80.0 - 100.0 fL Final  . MCH 05/07/2020 29.1  26.0 - 34.0 pg Final  . MCHC 05/07/2020 33.0  30.0 - 36.0 g/dL Final  . RDW 05/07/2020 12.8  11.5 - 15.5 % Final  . Platelets 05/07/2020 285  150 - 400 K/uL Final  . nRBC 05/07/2020 0.0  0.0 - 0.2 % Final  . Neutrophils Relative % 05/07/2020 66  % Final  . Neutro Abs 05/07/2020 5.2  1.7 - 7.7 K/uL Final  . Lymphocytes Relative 05/07/2020 24  % Final  . Lymphs Abs 05/07/2020 1.9  0.7 - 4.0 K/uL Final  . Monocytes Relative 05/07/2020 6  % Final  . Monocytes Absolute 05/07/2020 0.5  0 - 1 K/uL Final  . Eosinophils Relative 05/07/2020 3  % Final  . Eosinophils Absolute 05/07/2020 0.2  0 - 0 K/uL Final  . Basophils Relative 05/07/2020 1  % Final  . Basophils Absolute 05/07/2020 0.1  0 - 0 K/uL Final  . Immature Granulocytes  05/07/2020 0  % Final  . Abs Immature Granulocytes 05/07/2020 0.03  0.00 -  0.07 K/uL Final   Performed at [facility], [address], [place], [place] [address]    Assessment:  Sharon Gibson is a 39 y.o. female with B12 and iron deficiency anemia s/p gastric bypass surgery(2004). She has received IV iron(different preparations) x 5 ([place], [place]; [place], [place]; [place], [place]) with reactionsrequiring Benadryl and steroids.  Labs on 03/20/2016 revealed a normal hematocrit but iron deficiency (ferritin 8). B12 was 185 (low). She began B12 on 04/07/2016.  She received Venofer200 mg IV with premedications (Tylenol, Benadryl, and Decadron) on 05/13/2016. She had side effectsof dizziness, lightheadedness, low blood pressure, excess sweating, aches, and pains, nausea, and headache after her infusion.  She has received Venofer: 12/31/2016 and weekly x 3 (10/27/2018 - 11/10/2018).  She received Feraheme on 10/25/2019.  Ferritinhas been followed: 7.9 on06/23/2017, 8 on07/08/2016, 22 on 07/08/2016, 18 on01/06/2017, 10 on03/21/2018,13 on01/17/2020, 53 on 12/07/2018, 26 on 02/21/2019, 24 on 03/16/2019, 16 on 10/24/2019, 47 on 01/23/2020, 75 on 02/01/2020, and 46 on 05/07/2020.  She has B12 deficiency.B12 was 323 on 10/14/2018, 231 on 10/25/2019, and 442 on 02/01/2020.  She receives B12 monthly (last 01/23/2020).  She has a history ofmultiple pulmonary emboliin 2003. Per patient report, hypercoagulable work-upwas negative. She was on Coumadinx 6 months.  She has a history ofeasy bruising and heavy menses. She has anIUD.  The patient received the [place] COVID-19 vaccine on 12/05/2019 and 12/26/2019.  Symptomatically, she feels "ok".   She is still tired and has less energy than she would like. Ice pica has resolved. She still has restless legs.  Exam is stable.  Plan: 1.   Review labs from 05/07/2020. 2.   Iron deficiency Hematocrit  40.0. Hemoglobin 10.2.MCV 88.1. Ferritin  46. Iron saturation  33  % with a TIBC 294.             Ferritin goal 100.  She receives IV iron if her ferritin is < 30.  Symptomatically, she is doing well.             She receives premeds with Feraheme.                         Tylenol 650 mg po x 1.   Benadryl 50 mg po x 1.   Decadron 10 mg IV.  No Feraheme today. 3. B12 deficiency B12 was 442 on 02/01/2020. B12 today.  Patient would like to receive B12 at home.  Rx: B12 injection kit. 4.   RN to teach patient IM technique for B12 administration. 5.   RTC in 3 months for labs (CBC with diff, ferritin). 6.   RTC in 6 months for MD assessment, labs (CBC with diff, ferritin, iron studies, folate- day before) and +/- Feraheme.  I discussed the assessment and treatment plan with the patient.  The patient was provided an opportunity to ask questions and all were answered.  The patient agreed with the plan and demonstrated an understanding of the instructions.  The patient was advised to call back if the symptoms worsen or if the condition fails to improve as anticipated.   Lequita Asal, MD, PhD    05/08/2020, 2:04 PM  I, Mirian Mo Tufford, am acting as a Education administrator for Lequita Asal, MD.  I, Sherman Mike Gip, MD, have reviewed the above documentation for accuracy and completeness, and I agree with the above.

## 2020-05-08 ENCOUNTER — Encounter: Payer: Self-pay | Admitting: Hematology and Oncology

## 2020-05-08 ENCOUNTER — Inpatient Hospital Stay (HOSPITAL_BASED_OUTPATIENT_CLINIC_OR_DEPARTMENT_OTHER): Payer: BC Managed Care – PPO | Admitting: Hematology and Oncology

## 2020-05-08 ENCOUNTER — Inpatient Hospital Stay: Payer: BC Managed Care – PPO

## 2020-05-08 VITALS — BP 101/66 | HR 97 | Temp 97.1°F | Resp 18 | Wt 304.2 lb

## 2020-05-08 DIAGNOSIS — E538 Deficiency of other specified B group vitamins: Secondary | ICD-10-CM

## 2020-05-08 DIAGNOSIS — D509 Iron deficiency anemia, unspecified: Secondary | ICD-10-CM | POA: Diagnosis not present

## 2020-05-08 MED ORDER — CYANOCOBALAMIN 1000 MCG/ML IJ SOLN
1000.0000 ug | Freq: Once | INTRAMUSCULAR | Status: AC
  Administered 2020-05-08: 1000 ug via INTRAMUSCULAR
  Filled 2020-05-08: qty 1

## 2020-05-20 ENCOUNTER — Other Ambulatory Visit: Payer: Self-pay

## 2020-05-20 ENCOUNTER — Encounter: Payer: Self-pay | Admitting: Family Medicine

## 2020-05-20 ENCOUNTER — Ambulatory Visit
Admission: RE | Admit: 2020-05-20 | Discharge: 2020-05-20 | Disposition: A | Discharge status: Home / Self Care | Payer: BC Managed Care – PPO | Source: Ambulatory Visit | Attending: Physician Assistant | Admitting: Physician Assistant

## 2020-05-20 VITALS — BP 128/52 | HR 87 | Temp 98.9°F | Resp 18 | Ht 63.0 in | Wt 280.0 lb

## 2020-05-20 DIAGNOSIS — Z91018 Allergy to other foods: Secondary | ICD-10-CM

## 2020-05-20 DIAGNOSIS — R490 Dysphonia: Secondary | ICD-10-CM

## 2020-05-20 DIAGNOSIS — R220 Localized swelling, mass and lump, head: Secondary | ICD-10-CM | POA: Diagnosis not present

## 2020-05-20 MED ORDER — PREDNISONE 10 MG PO TABS: ORAL_TABLET | ORAL | 0 refills | Status: AC

## 2020-05-20 NOTE — Discharge Instructions (Signed)
-  Follow up with PCP next week -Continue benadryl. Start prednisone. Make appt with allergist for allergy testing. Keep food journal -To ED for increased swelling or trouble breathing

## 2020-05-20 NOTE — ED Triage Notes (Addendum)
Patient in today w/ c/o mouth swelling possibly due to an allergic reaction from a week ago. Patient states she is not in distress or experience difficulty breathing. Swelling responds to benadryl and ibuprofen. Pain associated w/ eating or drinking warm fluids.

## 2020-05-20 NOTE — ED Provider Notes (Signed)
[facility]    CSN: [id number] Arrival date & time: 05/20/20  1057      History   Chief Complaint Chief Complaint  Patient presents with  . Oral Swelling    HPI Sharon Gibson is a 39 y.o. female.   39 y/o female presents for swelling of her mouth. She says that a week ago she ate overnight oats and woke up with swelling of her tongue. She says she has also recently eaten sushi with increased swelling of the mouth. Admits to increased BP and HR at that time. She has been taking benadryl and motrin and had been feeling better until about 2-3 days ago. She says that 3 days ago she she had hard cider and grilled cheese and felt like her mouth and tongue were starting to swell again. She took benadryl again with some relief. Admits to going to a winery the following day where she did a wine taking and ate a chicken sandwich. She says her throat started to hurt and her tongue started to swell again. Admits to feeling itchy. Last dose of benadryl at 9 pm. She says her tongue is swollen now and her voice is hoarse. Denies chest tightness or breathing difficulty. She says she probably has a lot of allergies she is unaware of, has not had allergy testing since she was a child. No other complaints.      Past Medical History:  Diagnosis Date  . Anemia   . Anxiety   . Chicken pox   . Depression   . Eating disorder    Has had residential treatment and hospitalization previously.  Marland Kitchen Heart murmur    as a child  . History of anemia    RECIEVES IRON INFUSIONS YEARLY  . History of pulmonary embolus (PE) 2002   bilaterally-followed by [facility] hematology-w/u negative per pt  . Hypothyroidism    no meds currently  . PTSD (post-traumatic stress disorder)     Patient Active Problem List   Diagnosis Date Noted  . Cervical strain 11/28/2019  . Endometriosis determined by laparoscopy 06/13/2018  . Ovarian cyst 03/01/2018  . Hx pulmonary embolism 02/03/2018  . Generalized anxiety  disorder 10/12/2016  . Right knee pain 09/02/2016  . Severe recurrent major depression without psychotic features (HCC) 07/23/2016  . Bulimia nervosa, in partial remission, mild 07/23/2016    Class: Chronic  . Iron deficiency anemia 04/08/2016  . B12 deficiency 04/08/2016  . S/P gastric bypass 03/20/2016  . Eating disorder 03/20/2016  . Insomnia 03/20/2016  . PTSD (post-traumatic stress disorder) 03/20/2016  . Hypothyroidism 03/20/2016  . PCOS (polycystic ovarian syndrome) 12/14/2014    Past Surgical History:  Procedure Laterality Date  . CHOLECYSTECTOMY  2003  . EXCISION OF ENDOMETRIOMA    . GASTRIC BYPASS  2003  . LAPAROSCOPIC OVARIAN CYSTECTOMY Left 05/10/2018   Procedure: LAPAROSCOPIC OVARIAN CYSTECTOMY;  Surgeon: Natale Milch, MD;  Location: [facility] ORS;  Service: Gynecology;  Laterality: Left;    OB History    Gravida  0   Para  0   Term  0   Preterm  0   AB  0   Living  0     SAB  0   TAB  0   Ectopic  0   Multiple  0   Live Births  0            Home Medications    Prior to Admission medications   Medication Sig Start Date End Date  Taking? Authorizing Provider  buPROPion (WELLBUTRIN SR) 150 MG 12 hr tablet 1 tab qAM x 3-4 days, then 1 tab BID after that 11/02/19  Yes Johnson, Megan P, DO  cyclobenzaprine (FLEXERIL) 10 MG tablet Take 0.5-1 tablets (5-10 mg total) by mouth 3 (three) times daily. 02/01/20  Yes Johnson, Megan P, DO  diclofenac Sodium (VOLTAREN) 1 % GEL Apply 2 g topically 4 (four) times daily as needed (neck/shoulder pain). 11/28/19  Yes Valentino Nose, NP  levonorgestrel (MIRENA) 20 MCG/24HR IUD 1 each by Intrauterine route once.   Yes [provider]  levothyroxine (SYNTHROID) 75 MCG tablet Take 1 tablet (75 mcg total) by mouth daily. 11/02/19  Yes Johnson, Megan P, DO  Multiple Vitamin (MULTIVITAMIN) tablet Take 1 tablet by mouth daily.   Yes [provider]  clonazePAM (KLONOPIN) 0.5 MG tablet Take 1 tablet  (0.5 mg total) by mouth 2 (two) times daily as needed for anxiety. 04/01/20 05/01/20  Olevia Perches P, DO  predniSONE (DELTASONE) 10 MG tablet Take 6 tabs PO today and decrease by 1 tab daily for 6 days total 05/20/20 05/26/20  Eusebio Friendly B, PA-C  sertraline (ZOLOFT) 100 MG tablet Take 2 tablets (200 mg total) by mouth at bedtime. 11/02/19 01/31/20  Dorcas Carrow, DO    Family History Family History  Problem Relation Age of Onset  . Diabetes Father   . Anxiety disorder Father   . Prostate cancer Paternal Grandfather   . Alzheimer's disease Maternal Grandmother   . Alzheimer's disease Maternal Grandfather   . Lung cancer Paternal Grandmother     Social History Social History   Tobacco Use  . Smoking status: Never Smoker  . Smokeless tobacco: Never Used  Vaping Use  . Vaping Use: Never used  Substance Use Topics  . Alcohol use: Yes    Alcohol/week: 0.0 - 1.0 standard drinks    Comment: rare  . Drug use: No     Allergies   Penicillins, Azithromycin, Other, and Strawberry (diagnostic)   Review of Systems Review of Systems  Constitutional: Negative for fatigue and fever.  HENT: Positive for sore throat and voice change. Negative for ear pain, facial swelling, mouth sores, rhinorrhea and trouble swallowing.   Eyes: Negative for discharge, redness and itching.  Respiratory: Negative for cough, chest tightness, shortness of breath and wheezing.   Cardiovascular: Negative for chest pain.  Gastrointestinal: Negative for abdominal pain, nausea and vomiting.  Musculoskeletal: Negative for myalgias.  Skin: Negative for rash.  Allergic/Immunologic: Positive for food allergies.  Neurological: Negative for weakness, light-headedness and headaches.     Physical Exam Triage Vital Signs ED Triage Vitals  Enc Vitals Group     BP 05/20/20 1119 (!) 128/52     Pulse Rate 05/20/20 1119 87     Resp 05/20/20 1119 18     Temp 05/20/20 1119 98.9 F (37.2 C)     Temp Source 05/20/20 1119  Oral     SpO2 05/20/20 1119 100 %     Weight 05/20/20 1125 280 lb (127 kg)     Height 05/20/20 1125 5\' 3"  (1.6 m)     Head Circumference --      Peak Flow --      Pain Score 05/20/20 1124 7     Pain Loc --      Pain Edu? --      Excl. in GC? --    No data found.  Updated Vital Signs BP (!) 128/52 (BP Location:  Right Arm)   Pulse 87   Temp 98.9 F (37.2 C) (Oral)   Resp 18   Ht 5\' 3"  (1.6 m)   Wt 280 lb (127 kg)   SpO2 100%   BMI 49.60 kg/m    Physical Exam Vitals and nursing note reviewed.  Constitutional:      General: She is not in acute distress.    Appearance: Normal appearance. She is not ill-appearing or toxic-appearing.  HENT:     Head: Normocephalic and atraumatic.     Right Ear: Tympanic membrane, ear canal and external ear normal.     Left Ear: Tympanic membrane, ear canal and external ear normal.     Nose: Nose normal.     Mouth/Throat:     Mouth: Mucous membranes are moist. No angioedema.     Pharynx: Pharyngeal swelling (mild swelling of posterior pharynx and hard palete ) present. No uvula swelling.     Tonsils: 0 on the right. 0 on the left.     Comments: No facial swelling or lip swelling. Questionable mild tongue swelling Eyes:     General: No scleral icterus.       Right eye: No discharge.        Left eye: No discharge.     Conjunctiva/sclera: Conjunctivae normal.  Cardiovascular:     Rate and Rhythm: Normal rate and regular rhythm.     Heart sounds: Normal heart sounds.  Pulmonary:     Effort: Pulmonary effort is normal. No respiratory distress.     Breath sounds: Normal breath sounds. No wheezing, rhonchi or rales.  Musculoskeletal:     Cervical back: Neck supple.  Skin:    General: Skin is dry.  Neurological:     General: No focal deficit present.     Mental Status: She is alert. Mental status is at baseline.     Motor: No weakness.     Gait: Gait normal.  Psychiatric:        Mood and Affect: Mood is anxious.        Behavior:  Behavior normal.        Thought Content: Thought content normal.      UC Treatments / Results  Labs (all labs ordered are listed, but only abnormal results are displayed) Labs Reviewed - No data to display  EKG   Radiology No results found.  Procedures Procedures (including critical care time)  Medications Ordered in UC Medications - No data to display  Initial Impression / Assessment and Plan / UC Course  I have reviewed the triage vital signs and the nursing notes.  Pertinent labs & imaging results that were available during my care of the patient were reviewed by me and considered in my medical decision making (see chart for details).    Patient complaining of possible food allergies and recurrent mouth swelling following many different foods and alcohol over the past few days. She has been taking benadryl with relief but is concerned about some continued swelling. Exam is revealing of mild swelling of the posterior pharynx, hard palate, and tongue. VSS. Chest is CTA. She denies breathing difficulty or trouble swallowing and says her symptoms are improving and not worsening.  Advised prednisone, continuing benadryl and seeing allergist for testing. ED precautions discussed.   Final Clinical Impressions(s) / UC Diagnoses   Final diagnoses:  Food allergy  Mouth swelling  Voice hoarseness     Discharge Instructions     -Follow up with PCP next week -Continue  benadryl. Start prednisone. Make appt with allergist for allergy testing. Keep food journal -To ED for increased swelling or trouble breathing    ED Prescriptions    Medication Sig Dispense Auth. Provider   predniSONE (DELTASONE) 10 MG tablet Take 6 tabs PO today and decrease by 1 tab daily for 6 days total 21 tablet Shirlee LatchEaves, Laikynn Pollio B, PA-C     PDMP not reviewed this encounter.   Shirlee Latchaves, Makaylee Spielberg B, PA-C 05/22/20 1124

## 2020-05-26 MED ORDER — B-12 COMPLIANCE INJECTION 1000 MCG/ML IJ KIT: 1000.0000 ug | PACK | INTRAMUSCULAR | 3 refills | Status: DC

## 2020-06-14 ENCOUNTER — Telehealth: Payer: BC Managed Care – PPO | Admitting: Emergency Medicine

## 2020-06-14 DIAGNOSIS — H539 Unspecified visual disturbance: Secondary | ICD-10-CM

## 2020-06-14 DIAGNOSIS — J3489 Other specified disorders of nose and nasal sinuses: Secondary | ICD-10-CM

## 2020-06-14 NOTE — Progress Notes (Signed)
Based on what you shared with me, I feel your condition warrants further evaluation and I recommend that you be seen for a face to face office visit.  I am concerned you may have a severe infection from you tooth or sinus causing pressure to build up behind your eyes and change you visit. I believe you need to be seen TODAY! PLEASE GO TO THE URGENT CARE OR EMERGENCY ROOM AS SOON AS POSSIBLE.   NOTE: If you entered your credit card information for this eVisit, you will not be charged. You may see a "hold" on your card for the $35 but that hold will drop off and you will not have a charge processed.   If you are having a true medical emergency please call 911.      For an urgent face to face visit,  has five urgent care centers for your convenience:      NEW:  [facility] Directions [phone] [address] [place], [place] [address] . 10 am - 6pm Monday - Friday    [facility]) Get Driving Directions [phone] [address] [place], [place] [address] . 10 am to 8 pm Monday-Friday . 12 pm to 8 pm [facility] at [facility] Get Driving Directions [phone] [address] [place] [address] [place], [place] [address] . 8 am to 8 pm Monday-Friday . 9 am to 6 pm Saturday . 11 am to 6 pm Sunday     [facility] at [facility] Get Driving Directions  [phone] [address] [place], [place] [address] . 8 am to 8 pm Monday-Friday . 8 am to 4 pm [facility] Urgent Care at [facility] Directions [phone] [address] [place], [place] [address] . 12 pm to 6 pm Monday-Friday      Your e-visit answers were reviewed by a board certified advanced clinical practitioner to complete your personal care plan.  Thank you for using e-Visits.    **Please do not respond to  this message unless you have follow up questions.** Greater than 5 but less than 10 minutes spent researching, coordinating, and implementing care for this patient today

## 2020-07-08 ENCOUNTER — Telehealth: Payer: Self-pay

## 2020-07-08 NOTE — Telephone Encounter (Signed)
Patient notified. She states that she called the allergist and they moved her appointment up to Thursday.

## 2020-07-08 NOTE — Telephone Encounter (Signed)
Copied from CRM [id number]. Topic: General - Inquiry >> Jul 08, 2020 10:32 AM Daphine Deutscher D wrote: Reason for CRM: Pt called saying she has had allergy problems for a couple months now.  She has an appt next Tuesday to see someone at the [facility] [place] but in the mean time the rash on her arms has gotten worse.  Its likes hives and itching.  She knows she supposed to not take anything a week leading up to the appt there.  She is wanting to know what she should do.  CB#  [phone]

## 2020-07-08 NOTE — Telephone Encounter (Signed)
She can use topical hydrocortisone, if it's getting a lot worse, let me know

## 2020-07-08 NOTE — Telephone Encounter (Signed)
Routing to provider to advise.  

## 2020-08-06 ENCOUNTER — Other Ambulatory Visit: Payer: Self-pay | Admitting: Family Medicine

## 2020-08-06 NOTE — Telephone Encounter (Signed)
Requested medication (s) are due for refill today: yes  Requested medication (s) are on the active medication list: yes  Last refill:  start: 11/02/19 end: 01/31/20 #180 1 refill   Future visit scheduled: no  Notes to clinic:  expired medication. Do you want to renew Rx?     Requested Prescriptions  Pending Prescriptions Disp Refills   sertraline (ZOLOFT) 100 MG tablet [Pharmacy Med Name: Sertraline HCl 100 MG Oral Tablet] 180 tablet 0    Sig: TAKE 2 TABLETS BY MOUTH AT BEDTIME      Psychiatry:  Antidepressants - SSRI Passed - 08/06/2020  6:08 PM      Passed - Completed PHQ-2 or PHQ-9 in the last 360 days      Passed - Valid encounter within last 6 months    Recent Outpatient Visits           4 months ago Fatigue, unspecified type   [facility] Particia Nearing, PA-C   6 months ago Routine general medical examination at a health care facility   [facility] [place] P, DO   7 months ago Peripheral edema   [facility] [place], Megan P, DO   8 months ago Strain of neck muscle, subsequent encounter   [facility] Valentino Nose, NP   9 months ago S/P gastric bypass   [facility] [place], [place], DO

## 2020-08-07 ENCOUNTER — Inpatient Hospital Stay: Payer: BC Managed Care – PPO | Attending: Hematology and Oncology

## 2020-08-07 ENCOUNTER — Other Ambulatory Visit: Payer: Self-pay

## 2020-08-07 DIAGNOSIS — E538 Deficiency of other specified B group vitamins: Secondary | ICD-10-CM | POA: Diagnosis not present

## 2020-08-07 DIAGNOSIS — E611 Iron deficiency: Secondary | ICD-10-CM | POA: Diagnosis present

## 2020-08-07 DIAGNOSIS — D509 Iron deficiency anemia, unspecified: Secondary | ICD-10-CM

## 2020-08-07 LAB — CBC WITH DIFFERENTIAL/PLATELET
Abs Immature Granulocytes: 0.02 10*3/uL (ref 0.00–0.07)
Basophils Absolute: 0.1 10*3/uL (ref 0.0–0.1)
Basophils Relative: 1 %
Eosinophils Absolute: 0.3 10*3/uL (ref 0.0–0.5)
Eosinophils Relative: 4 %
HCT: 39.9 % (ref 36.0–46.0)
Hemoglobin: 13.4 g/dL (ref 12.0–15.0)
Immature Granulocytes: 0 %
Lymphocytes Relative: 28 %
Lymphs Abs: 1.9 10*3/uL (ref 0.7–4.0)
MCH: 29.1 pg (ref 26.0–34.0)
MCHC: 33.6 g/dL (ref 30.0–36.0)
MCV: 86.7 fL (ref 80.0–100.0)
Monocytes Absolute: 0.5 10*3/uL (ref 0.1–1.0)
Monocytes Relative: 7 %
Neutro Abs: 4 10*3/uL (ref 1.7–7.7)
Neutrophils Relative %: 60 %
Platelets: 212 10*3/uL (ref 150–400)
RBC: 4.6 MIL/uL (ref 3.87–5.11)
RDW: 13 % (ref 11.5–15.5)
WBC: 6.7 10*3/uL (ref 4.0–10.5)
nRBC: 0 % (ref 0.0–0.2)

## 2020-08-07 LAB — FERRITIN: Ferritin: 46 ng/mL (ref 11–307)

## 2020-08-07 NOTE — Telephone Encounter (Signed)
Called pt scheduled for 11/19 pt states that she is completely out of medication

## 2020-08-07 NOTE — Telephone Encounter (Signed)
appt

## 2020-08-07 NOTE — Telephone Encounter (Signed)
See P[facility] note below °

## 2020-08-07 NOTE — Telephone Encounter (Signed)
Called pt to let her rx has been sent to pharmacy no answer lvm

## 2020-08-08 ENCOUNTER — Other Ambulatory Visit: Payer: BC Managed Care – PPO

## 2020-08-16 ENCOUNTER — Encounter: Payer: Self-pay | Admitting: Family Medicine

## 2020-08-16 ENCOUNTER — Ambulatory Visit (INDEPENDENT_AMBULATORY_CARE_PROVIDER_SITE_OTHER): Payer: BC Managed Care – PPO | Admitting: Family Medicine

## 2020-08-16 ENCOUNTER — Other Ambulatory Visit: Payer: Self-pay

## 2020-08-16 VITALS — BP 107/69 | HR 74 | Temp 99.2°F

## 2020-08-16 DIAGNOSIS — F411 Generalized anxiety disorder: Secondary | ICD-10-CM

## 2020-08-16 DIAGNOSIS — R21 Rash and other nonspecific skin eruption: Secondary | ICD-10-CM

## 2020-08-16 DIAGNOSIS — E538 Deficiency of other specified B group vitamins: Secondary | ICD-10-CM

## 2020-08-16 DIAGNOSIS — Z13220 Encounter for screening for lipoid disorders: Secondary | ICD-10-CM

## 2020-08-16 DIAGNOSIS — Z23 Encounter for immunization: Secondary | ICD-10-CM

## 2020-08-16 DIAGNOSIS — E039 Hypothyroidism, unspecified: Secondary | ICD-10-CM

## 2020-08-16 DIAGNOSIS — F332 Major depressive disorder, recurrent severe without psychotic features: Secondary | ICD-10-CM

## 2020-08-16 DIAGNOSIS — Z1159 Encounter for screening for other viral diseases: Secondary | ICD-10-CM

## 2020-08-16 MED ORDER — SERTRALINE HCL 100 MG PO TABS
200.0000 mg | ORAL_TABLET | Freq: Every day | ORAL | 1 refills | Status: DC
Start: 2020-08-16 — End: 2021-04-22

## 2020-08-16 MED ORDER — BUPROPION HCL ER (XL) 150 MG PO TB24: 150.0000 mg | ORAL_TABLET | Freq: Every day | ORAL | 1 refills | Status: DC

## 2020-08-16 MED ORDER — CLONAZEPAM 0.5 MG PO TABS
0.5000 mg | ORAL_TABLET | Freq: Two times a day (BID) | ORAL | 1 refills | Status: DC | PRN
Start: 2020-08-16 — End: 2020-11-11

## 2020-08-16 MED ORDER — GABAPENTIN 100 MG PO CAPS: 100.0000 mg | ORAL_CAPSULE | Freq: Every day | ORAL | 3 refills | Status: DC

## 2020-08-16 NOTE — Assessment & Plan Note (Signed)
Looks like urticaria, but only on R arm. Will get her into see dermatology, ?neurogenic. Will add gabapentin in to try to help with itching. Continue to monitor. Call with any concerns.

## 2020-08-16 NOTE — Assessment & Plan Note (Signed)
Has been off her medicine. Will recheck levels and treat as needed. Await results.

## 2020-08-16 NOTE — Assessment & Plan Note (Signed)
Rechecking labs today. Await results. Treat as needed.  °

## 2020-08-16 NOTE — Assessment & Plan Note (Signed)
Will change her wellbutrin to 150mg  XR and recheck 4-6 weeks. Call with any concerns. Continue to monitor.

## 2020-08-16 NOTE — Progress Notes (Signed)
BP 107/69   Pulse 74   Temp 99.2 F (37.3 C) (Oral)   SpO2 97%    Subjective:    Patient ID: Sharon Gibson, female    DOB: 1981-07-10, 39 y.o.   MRN: [id number]  HPI: Sharon Gibson is a 39 y.o. female  Chief Complaint  Patient presents with  . Rash    Back,Right arm, present for months, itchy, comes and goes and moves around, patient has been using mometasone cream for rash  . Anxiety  . Hypothyroidism   RASH Duration:  months  Location: on R arm, but will move around on that arm, and 1 spot on her back  Itching: yes Burning: no Redness: yes Oozing: no Scaling: no Blisters: no Painful: no Fevers: no Change in detergents/soaps/personal care products: no Recent illness: no Recent travel:no History of same: no Context: stable Alleviating factors: hydrocortisone cream, benadryl and lotion/moisturizer Treatments attempted:hydrocortisone cream, benadryl, OTC anit-fungal and lotion/moisturizer Shortness of breath: no  Throat/tongue swelling: no Myalgias/arthralgias: no  ANXIETY/DEPRESSION Duration: chronic Status:uncontrolled Anxious mood: yes  Excessive worrying: yes Irritability: no  Sweating: no Nausea: no Palpitations:no Hyperventilation: no Panic attacks: no Agoraphobia: no  Obscessions/compulsions: no Depressed mood: yes Depression screen [facility] 2/9 08/16/2020 02/01/2020 12/11/2019 06/06/2019 05/23/2019  Decreased Interest 2 2 2 1 3   Down, Depressed, Hopeless 2 2 1 1 3   PHQ - 2 Score 4 4 3 2 6   Altered sleeping 3 3 2  0 3  Tired, decreased energy 3 3 3  0 3  Change in appetite 3 3 3 3 3   Feeling bad or failure about yourself  2 3 2 2 3   Trouble concentrating 2 3 1  0 3  Moving slowly or fidgety/restless 0 3 0 0 2  Suicidal thoughts 0 1 1 0 3  PHQ-9 Score 17 23 15 7 26   Difficult doing work/chores Somewhat difficult Very difficult Very difficult Not difficult at all Extremely dIfficult   Anhedonia: no Weight changes: no Insomnia: yes hard to fall  asleep  Hypersomnia: no Fatigue/loss of energy: yes Feelings of worthlessness: no Feelings of guilt: no Impaired concentration/indecisiveness: no Suicidal ideations: no  Crying spells: no Recent Stressors/Life Changes: no   Relationship problems: no   Family stress: no     Financial stress: no    Job stress: no    Recent death/loss: no  HYPOTHYROIDISM Thyroid control status:uncontrolled Satisfied with current treatment? yes Medication side effects: no Medication compliance: has been off her meds Recent dose adjustment:no Fatigue: yes Cold intolerance: no Heat intolerance: no Weight gain: no Weight loss: no Constipation: no Diarrhea/loose stools: no Palpitations: no Lower extremity edema: no Anxiety/depressed mood: yes  Relevant past medical, surgical, family and social history reviewed and updated as indicated. Interim medical history since our last visit reviewed. Allergies and medications reviewed and updated.  Review of Systems  Constitutional: Negative.   HENT: Negative.   Respiratory: Negative.   Cardiovascular: Negative.   Gastrointestinal: Negative.   Musculoskeletal: Negative.   Skin: Positive for rash. Negative for color change, pallor and wound.  Neurological: Negative.   Psychiatric/Behavioral: Negative.     Per HPI unless specifically indicated above     Objective:    BP 107/69   Pulse 74   Temp 99.2 F (37.3 C) (Oral)   SpO2 97%   Wt Readings from Last 3 Encounters:  05/20/20 280 lb (127 kg)  05/08/20 (!) 304 lb 3.8 oz (138 kg)  03/24/20 270 lb (122.5 kg)    Physical Exam  Vitals and nursing note reviewed.  Constitutional:      General: She is not in acute distress.    Appearance: Normal appearance. She is not ill-appearing, toxic-appearing or diaphoretic.  HENT:     Head: Normocephalic and atraumatic.     Right Ear: External ear normal.     Left Ear: External ear normal.     Nose: Nose normal.     Mouth/Throat:     Mouth: Mucous  membranes are moist.     Pharynx: Oropharynx is clear.  Eyes:     General: No scleral icterus.       Right eye: No discharge.        Left eye: No discharge.     Extraocular Movements: Extraocular movements intact.     Conjunctiva/sclera: Conjunctivae normal.     Pupils: Pupils are equal, round, and reactive to light.  Cardiovascular:     Rate and Rhythm: Normal rate and regular rhythm.     Pulses: Normal pulses.     Heart sounds: Normal heart sounds. No murmur heard.  No friction rub. No gallop.   Pulmonary:     Effort: Pulmonary effort is normal. No respiratory distress.     Breath sounds: Normal breath sounds. No stridor. No wheezing, rhonchi or rales.  Chest:     Chest wall: No tenderness.  Musculoskeletal:        General: Normal range of motion.     Cervical back: Normal range of motion and neck supple.  Skin:    General: Skin is warm and dry.     Capillary Refill: Capillary refill takes less than 2 seconds.     Coloration: Skin is not jaundiced or pale.     Findings: No bruising, erythema, lesion or rash.  Neurological:     General: No focal deficit present.     Mental Status: She is alert and oriented to person, place, and time. Mental status is at baseline.  Psychiatric:        Mood and Affect: Mood normal.        Behavior: Behavior normal.        Thought Content: Thought content normal.        Judgment: Judgment normal.     Results for orders placed or performed in visit on 08/07/20  Ferritin  Result Value Ref Range   Ferritin 46 11 - 307 ng/mL  CBC with Differential/Platelet  Result Value Ref Range   WBC 6.7 4.0 - 10.5 K/uL   RBC 4.60 3.87 - 5.11 MIL/uL   Hemoglobin 13.4 12.0 - 15.0 g/dL   HCT 33.3 36 - 46 %   MCV 86.7 80.0 - 100.0 fL   MCH 29.1 26.0 - 34.0 pg   MCHC 33.6 30.0 - 36.0 g/dL   RDW 54.5 62.5 - 63.8 %   Platelets 212 150 - 400 K/uL   nRBC 0.0 0.0 - 0.2 %   Neutrophils Relative % 60 %   Neutro Abs 4.0 1.7 - 7.7 K/uL   Lymphocytes Relative 28  %   Lymphs Abs 1.9 0.7 - 4.0 K/uL   Monocytes Relative 7 %   Monocytes Absolute 0.5 0.1 - 1.0 K/uL   Eosinophils Relative 4 %   Eosinophils Absolute 0.3 0.0 - 0.5 K/uL   Basophils Relative 1 %   Basophils Absolute 0.1 0.0 - 0.1 K/uL   Immature Granulocytes 0 %   Abs Immature Granulocytes 0.02 0.00 - 0.07 K/uL      Assessment & Plan:  Problem List Items Addressed This Visit      Endocrine   Hypothyroidism    Has been off her medicine. Will recheck levels and treat as needed. Await results.       Relevant Orders   Comprehensive metabolic panel   TSH     Musculoskeletal and Integument   Rash - Primary    Looks like urticaria, but only on R arm. Will get her into see dermatology, ?neurogenic. Will add gabapentin in to try to help with itching. Continue to monitor. Call with any concerns.       Relevant Orders   Ambulatory referral to Dermatology     Other   B12 deficiency    Rechecking labs today. Await results. Treat as needed.       Relevant Orders   Comprehensive metabolic panel   H41 and Folate Panel   Severe recurrent major depression without psychotic features (HCC)    Will change her wellbutrin to 150mg  XR and recheck 4-6 weeks. Call with any concerns. Continue to monitor.       Relevant Medications   hydrOXYzine (ATARAX/VISTARIL) 25 MG tablet   sertraline (ZOLOFT) 100 MG tablet   buPROPion (WELLBUTRIN XL) 150 MG 24 hr tablet   Other Relevant Orders   Comprehensive metabolic panel   Generalized anxiety disorder    Under good control on current regimen. Continue current regimen. Continue to monitor. Call with any concerns. Refills given. 60 pill with refills should last at least 3 months. Follow up 3 months on mood.        Relevant Medications   hydrOXYzine (ATARAX/VISTARIL) 25 MG tablet   sertraline (ZOLOFT) 100 MG tablet   buPROPion (WELLBUTRIN XL) 150 MG 24 hr tablet   Other Relevant Orders   Comprehensive metabolic panel    Other Visit Diagnoses      Screening for cholesterol level       Labs drawn today. Await results.    Relevant Orders   Comprehensive metabolic panel   Lipid Panel w/o Chol/HDL Ratio   Need for hepatitis C screening test       Labs drawn today. Await results.    Relevant Orders   Hepatitis C Antibody   Need for influenza vaccination       Flu shot given today.    Relevant Orders   Flu Vaccine QUAD 6+ mos PF IM (Fluarix Quad PF) (Completed)       Follow up plan: Return 4-6 weeks follow up thyroid, mood and rash.

## 2020-08-16 NOTE — Assessment & Plan Note (Signed)
Under good control on current regimen. Continue current regimen. Continue to monitor. Call with any concerns. Refills given. 60 pill with refills should last at least 3 months. Follow up 3 months on mood.

## 2020-08-21 ENCOUNTER — Other Ambulatory Visit: Payer: Self-pay | Admitting: Family Medicine

## 2020-08-21 DIAGNOSIS — E039 Hypothyroidism, unspecified: Secondary | ICD-10-CM

## 2020-08-21 LAB — CBC WITH DIFFERENTIAL/PLATELET
Basophils Absolute: 0.1 10*3/uL (ref 0.0–0.2)
Basos: 1 %
EOS (ABSOLUTE): 0.2 10*3/uL (ref 0.0–0.4)
Eos: 3 %
Hematocrit: 42.4 % (ref 34.0–46.6)
Hemoglobin: 14.1 g/dL (ref 11.1–15.9)
Immature Grans (Abs): 0 10*3/uL (ref 0.0–0.1)
Immature Granulocytes: 0 %
Lymphocytes Absolute: 2 10*3/uL (ref 0.7–3.1)
Lymphs: 31 %
MCH: 28.7 pg (ref 26.6–33.0)
MCHC: 33.3 g/dL (ref 31.5–35.7)
MCV: 86 fL (ref 79–97)
Monocytes Absolute: 0.5 10*3/uL (ref 0.1–0.9)
Monocytes: 7 %
Neutrophils Absolute: 3.7 10*3/uL (ref 1.4–7.0)
Neutrophils: 58 %
Platelets: 107 10*3/uL — ABNORMAL LOW (ref 150–450)
RBC: 4.92 x10E6/uL (ref 3.77–5.28)
RDW: 12.2 % (ref 11.7–15.4)
WBC: 6.5 10*3/uL (ref 3.4–10.8)

## 2020-08-21 LAB — LIPID PANEL W/O CHOL/HDL RATIO
Cholesterol, Total: 173 mg/dL (ref 100–199)
HDL: 66 mg/dL (ref >39)
LDL Chol Calc (NIH): 95 mg/dL (ref 0–99)
Triglycerides: 60 mg/dL (ref 0–149)
VLDL Cholesterol Cal: 12 mg/dL (ref 5–40)

## 2020-08-21 LAB — COMPREHENSIVE METABOLIC PANEL
ALT: 17 IU/L (ref 0–32)
AST: 16 IU/L (ref 0–40)
Albumin/Globulin Ratio: 2.1 (ref 1.2–2.2)
Albumin: 4.2 g/dL (ref 3.8–4.8)
Alkaline Phosphatase: 93 IU/L (ref 44–121)
BUN/Creatinine Ratio: 15 (ref 9–23)
BUN: 12 mg/dL (ref 6–24)
Bilirubin Total: 0.3 mg/dL (ref 0.0–1.2)
CO2: 20 mmol/L (ref 20–29)
Calcium: 9 mg/dL (ref 8.7–10.2)
Chloride: 105 mmol/L (ref 96–106)
Creatinine, Ser: 0.81 mg/dL (ref 0.57–1.00)
GFR calc Af Amer: 105 mL/min/{1.73_m2} (ref >59)
GFR calc non Af Amer: 91 mL/min/{1.73_m2} (ref >59)
Globulin, Total: 2 g/dL (ref 1.5–4.5)
Glucose: 80 mg/dL (ref 65–99)
Potassium: 4.6 mmol/L (ref 3.5–5.2)
Sodium: 140 mmol/L (ref 134–144)
Total Protein: 6.2 g/dL (ref 6.0–8.5)

## 2020-08-21 LAB — TSH: TSH: 8.77 u[IU]/mL — ABNORMAL HIGH (ref 0.450–4.500)

## 2020-08-21 LAB — VITAMIN B12: Vitamin B-12: 933 pg/mL (ref 232–1245)

## 2020-08-21 LAB — HEPATITIS C ANTIBODY: Hep C Virus Ab: <0.1 s/co ratio (ref 0.0–0.9)

## 2020-10-02 ENCOUNTER — Ambulatory Visit (INDEPENDENT_AMBULATORY_CARE_PROVIDER_SITE_OTHER): Payer: BC Managed Care – PPO | Admitting: Family Medicine

## 2020-10-02 ENCOUNTER — Encounter: Payer: Self-pay | Admitting: Family Medicine

## 2020-10-02 ENCOUNTER — Other Ambulatory Visit: Payer: Self-pay

## 2020-10-02 VITALS — BP 112/71 | HR 67 | Temp 98.5°F

## 2020-10-02 DIAGNOSIS — E039 Hypothyroidism, unspecified: Secondary | ICD-10-CM

## 2020-10-02 DIAGNOSIS — Z8659 Personal history of other mental and behavioral disorders: Secondary | ICD-10-CM

## 2020-10-02 DIAGNOSIS — F332 Major depressive disorder, recurrent severe without psychotic features: Secondary | ICD-10-CM

## 2020-10-02 DIAGNOSIS — F411 Generalized anxiety disorder: Secondary | ICD-10-CM | POA: Diagnosis not present

## 2020-10-02 MED ORDER — LEVOTHYROXINE SODIUM 75 MCG PO TABS
75.0000 ug | ORAL_TABLET | Freq: Every day | ORAL | 2 refills | Status: DC
Start: 2020-10-02 — End: 2020-11-17

## 2020-10-02 NOTE — Assessment & Plan Note (Signed)
Doing better. Stable on the wellbutrin. Would like to work on counseling now. List of counselors given. Call with any concerns. Continue to monitor.

## 2020-10-02 NOTE — Assessment & Plan Note (Signed)
Has not been on her meds. Will restart meds and check labs in 6 weeks. Call with any concerns.

## 2020-10-02 NOTE — Progress Notes (Signed)
BP 112/71   Pulse 67   Temp 98.5 F (36.9 C)   SpO2 98%    Subjective:    Patient ID: Sharon Gibson, female    DOB: May 19, 1981, 40 y.o.   MRN: [id number]  HPI: Sharon Gibson is a 40 y.o. female  Chief Complaint  Patient presents with  . Depression  . Thyroid Problem    Hypothyroidism, has not been taking the medication for thyroid   . Rash    Pt states hives are gone thinks medication is working good    DEPRESSION Mood status: stable Satisfied with current treatment?: yes Symptom severity: mild  Duration of current treatment : chronic Side effects: no Medication compliance: excellent compliance Psychotherapy/counseling: yes in the past Previous psychiatric medications: wellbutrin Depressed mood: yes Anxious mood: yes Anhedonia: no Significant weight loss or gain: no Insomnia: no  Fatigue: yes Feelings of worthlessness or guilt: no Impaired concentration/indecisiveness: no Suicidal ideations: no Hopelessness: no Crying spells: no Depression screen [facility] 2/9 08/16/2020 02/01/2020 12/11/2019 06/06/2019 05/23/2019  Decreased Interest 2 2 2 1 3   Down, Depressed, Hopeless 2 2 1 1 3   PHQ - 2 Score 4 4 3 2 6   Altered sleeping 3 3 2  0 3  Tired, decreased energy 3 3 3  0 3  Change in appetite 3 3 3 3 3   Feeling bad or failure about yourself  2 3 2 2 3   Trouble concentrating 2 3 1  0 3  Moving slowly or fidgety/restless 0 3 0 0 2  Suicidal thoughts 0 1 1 0 3  PHQ-9 Score 17 23 15 7 26   Difficult doing work/chores Somewhat difficult Very difficult Very difficult Not difficult at all Extremely dIfficult   HYPOTHYROIDISM- has been off her thyroid meds Thyroid control status:uncontrolled Satisfied with current treatment? no Medication side effects: no Medication compliance: poor compliance Recent dose adjustment:yes Fatigue: no Cold intolerance: no Heat intolerance: no Weight gain: no Weight loss: no Constipation: no Diarrhea/loose stools: no Palpitations:  no Lower extremity edema: no Anxiety/depressed mood: no  Doing well with her ED. Does have a long history, but doing well. She is starting weight watchers and would just like to be held accountable and have someone else monitoring as well. No other concerns or complaints today. Relevant past medical, surgical, family and social history reviewed and updated as indicated. Interim medical history since our last visit reviewed. Allergies and medications reviewed and updated.  Review of Systems  Constitutional: Negative.   Respiratory: Negative.   Cardiovascular: Negative.   Gastrointestinal: Negative.   Musculoskeletal: Negative.   Psychiatric/Behavioral: Negative.     Per HPI unless specifically indicated above     Objective:    BP 112/71   Pulse 67   Temp 98.5 F (36.9 C)   SpO2 98%   Wt Readings from Last 3 Encounters:  05/20/20 280 lb (127 kg)  05/08/20 (!) 304 lb 3.8 oz (138 kg)  03/24/20 270 lb (122.5 kg)    Physical Exam Vitals and nursing note reviewed.  Constitutional:      General: She is not in acute distress.    Appearance: Normal appearance. She is not ill-appearing, toxic-appearing or diaphoretic.  HENT:     Head: Normocephalic and atraumatic.     Right Ear: External ear normal.     Left Ear: External ear normal.     Nose: Nose normal.     Mouth/Throat:     Mouth: Mucous membranes are moist.     Pharynx: Oropharynx is  clear.  Eyes:     General: No scleral icterus.       Right eye: No discharge.        Left eye: No discharge.     Extraocular Movements: Extraocular movements intact.     Conjunctiva/sclera: Conjunctivae normal.     Pupils: Pupils are equal, round, and reactive to light.  Cardiovascular:     Rate and Rhythm: Normal rate and regular rhythm.     Pulses: Normal pulses.     Heart sounds: Normal heart sounds. No murmur heard. No friction rub. No gallop.   Pulmonary:     Effort: Pulmonary effort is normal. No respiratory distress.     Breath  sounds: Normal breath sounds. No stridor. No wheezing, rhonchi or rales.  Chest:     Chest wall: No tenderness.  Musculoskeletal:        General: Normal range of motion.     Cervical back: Normal range of motion and neck supple.  Skin:    General: Skin is warm and dry.     Capillary Refill: Capillary refill takes less than 2 seconds.     Coloration: Skin is not jaundiced or pale.     Findings: No bruising, erythema, lesion or rash.  Neurological:     General: No focal deficit present.     Mental Status: She is alert and oriented to person, place, and time. Mental status is at baseline.  Psychiatric:        Mood and Affect: Mood normal.        Behavior: Behavior normal.        Thought Content: Thought content normal.        Judgment: Judgment normal.     Results for orders placed or performed in visit on 08/16/20  Comprehensive metabolic panel  Result Value Ref Range   Glucose 80 65 - 99 mg/dL   BUN 12 6 - 24 mg/dL   Creatinine, Ser 5.95 0.57 - 1.00 mg/dL   GFR calc non Af Amer 91 >59 mL/min/1.73   GFR calc Af Amer 105 >59 mL/min/1.73   BUN/Creatinine Ratio 15 9 - 23   Sodium 140 134 - 144 mmol/L   Potassium 4.6 3.5 - 5.2 mmol/L   Chloride 105 96 - 106 mmol/L   CO2 20 20 - 29 mmol/L   Calcium 9.0 8.7 - 10.2 mg/dL   Total Protein 6.2 6.0 - 8.5 g/dL   Albumin 4.2 3.8 - 4.8 g/dL   Globulin, Total 2.0 1.5 - 4.5 g/dL   Albumin/Globulin Ratio 2.1 1.2 - 2.2   Bilirubin Total 0.3 0.0 - 1.2 mg/dL   Alkaline Phosphatase 93 44 - 121 IU/L   AST 16 0 - 40 IU/L   ALT 17 0 - 32 IU/L  TSH  Result Value Ref Range   TSH 8.770 (H) 0.450 - 4.500 uIU/mL  Lipid Panel w/o Chol/HDL Ratio  Result Value Ref Range   Cholesterol, Total 173 100 - 199 mg/dL   Triglycerides 60 0 - 149 mg/dL   HDL 66 >63 mg/dL   VLDL Cholesterol Cal 12 5 - 40 mg/dL   LDL Chol Calc (NIH) 95 0 - 99 mg/dL  Hepatitis C Antibody  Result Value Ref Range   Hep C Virus Ab <0.1 0.0 - 0.9 s/co ratio  CBC with  Differential/Platelet  Result Value Ref Range   WBC 6.5 3.4 - 10.8 x10E3/uL   RBC 4.92 3.77 - 5.28 x10E6/uL   Hemoglobin 14.1 11.1 - 15.9  g/dL   Hematocrit 42.4 34.0 - 46.6 %   MCV 86 79 - 97 fL   MCH 28.7 26.6 - 33.0 pg   MCHC 33.3 31.5 - 35.7 g/dL   RDW 12.2 11.7 - 15.4 %   Platelets 107 (L) 150 - 450 x10E3/uL   Neutrophils 58 Not Estab. %   Lymphs 31 Not Estab. %   Monocytes 7 Not Estab. %   Eos 3 Not Estab. %   Basos 1 Not Estab. %   Neutrophils Absolute 3.7 1.4 - 7.0 x10E3/uL   Lymphocytes Absolute 2.0 0.7 - 3.1 x10E3/uL   Monocytes Absolute 0.5 0.1 - 0.9 x10E3/uL   EOS (ABSOLUTE) 0.2 0.0 - 0.4 x10E3/uL   Basophils Absolute 0.1 0.0 - 0.2 x10E3/uL   Immature Granulocytes 0 Not Estab. %   Immature Grans (Abs) 0.0 0.0 - 0.1 x10E3/uL   Hematology Comments: Note:   Vitamin B12  Result Value Ref Range   Vitamin B-12 933 232 - 1,245 pg/mL      Assessment & Plan:   Problem List Items Addressed This Visit      Endocrine   Hypothyroidism - Primary    Has not been on her meds. Will restart meds and check labs in 6 weeks. Call with any concerns.       Relevant Medications   levothyroxine (SYNTHROID) 75 MCG tablet     Other   History of eating disorder    Doing OK right now. Establishing with counselor. She is joining [employer], so we will monitor closely. She will call if she starts having any increase in intrusive thoughts      Severe recurrent major depression without psychotic features ([place])    Doing better. Stable on the wellbutrin. Would like to work on counseling now. List of counselors given. Call with any concerns. Continue to monitor.       Generalized anxiety disorder       Follow up plan: Return in about 6 weeks (around 11/13/2020).

## 2020-10-02 NOTE — Assessment & Plan Note (Signed)
Doing OK right now. Establishing with counselor. She is joining [employer], so we will monitor closely. She will call if she starts having any increase in intrusive thoughts

## 2020-10-02 NOTE — Patient Instructions (Signed)
[email or web address]

## 2020-10-16 ENCOUNTER — Other Ambulatory Visit: Payer: BC Managed Care – PPO

## 2020-10-16 DIAGNOSIS — Z20822 Contact with and (suspected) exposure to covid-19: Secondary | ICD-10-CM

## 2020-10-18 LAB — NOVEL CORONAVIRUS, NAA: SARS-CoV-2, NAA: NOT DETECTED

## 2020-10-18 LAB — SARS-COV-2, NAA 2 DAY TAT

## 2020-10-22 ENCOUNTER — Other Ambulatory Visit: Payer: Self-pay

## 2020-10-22 ENCOUNTER — Ambulatory Visit: Payer: BC Managed Care – PPO | Admitting: Dermatology

## 2020-10-22 DIAGNOSIS — L720 Epidermal cyst: Secondary | ICD-10-CM | POA: Diagnosis not present

## 2020-10-22 DIAGNOSIS — L281 Prurigo nodularis: Secondary | ICD-10-CM

## 2020-10-22 DIAGNOSIS — R21 Rash and other nonspecific skin eruption: Secondary | ICD-10-CM

## 2020-10-22 DIAGNOSIS — L719 Rosacea, unspecified: Secondary | ICD-10-CM | POA: Diagnosis not present

## 2020-10-22 MED ORDER — LEVOCETIRIZINE DIHYDROCHLORIDE 5 MG PO TABS: 5.0000 mg | ORAL_TABLET | Freq: Every evening | ORAL | 3 refills | Status: DC

## 2020-10-22 MED ORDER — FINACEA 15 % EX FOAM: CUTANEOUS | 5 refills | Status: DC

## 2020-10-22 MED ORDER — CLOBETASOL PROPIONATE 0.05 % EX CREA: TOPICAL_CREAM | CUTANEOUS | 0 refills | Status: DC

## 2020-10-22 NOTE — Progress Notes (Signed)
New Patient Visit  Subjective  Sharon Gibson is a 40 y.o. female who presents for the following: Skin Problem (Patient presents today for an itchy rash on her right arm medial thighs, present since September 2021, off and on (rash came up on medial thighs 1 week ago). It is improving some now. She was prescribed Gabapentin, which has helped. She tried mometasone cream but didn't help itch. Hydroxyzine also did not help itch. She also has a rash on her lower back (not itchy) that she's used [facility] cream for in the past. She also has a white spot on her face to check and possible rosacea.).   The following portions of the chart were reviewed this encounter and updated as appropriate:       Review of Systems:  No other skin or systemic complaints except as noted in HPI or Assessment and Plan.  Objective  Well appearing patient in no apparent distress; mood and affect are within normal limits.  A focused examination was performed including face, trunk, extremities. Relevant physical exam findings are noted in the Assessment and Plan.  Objective  Right Upper Arm: Light pink edematous papules R post upper arm and faint erythema on the right upper arm; violaceous scaly patches bilateral medial thighs  Objective  spinal lower back: Hyperpigmented macules; superficial scarring; pink excoriated thickened papule  Objective  face: Erythema with telangiectasia R > L malar cheeks, nose   Assessment & Plan    Milia - tiny firm white papule of the left medial canthus - type of cyst - benign - may use adapalene 0.1% gel OTC qhs - observe  Rash Right Upper Arm  Possible Urticaria vrs dermatitis  Start Xyzal 5mg  take 1 po QD dsp #30 3Rf. May take additional Xyzal or Allegra mid-day.  Restart hydroxyzine 25mg  1 po qhs (pt has at home)   Start clobetasol cream Apply 1-2 times a day to right upper arm and lower back until improved. May use to medial thighs QD no longer than 2 weeks.  Avoid face, groin, axilla. Caution atrophy with long-term use.   Topical steroids (such as triamcinolone, fluocinolone, fluocinonide, mometasone, clobetasol, halobetasol, betamethasone, hydrocortisone) can cause thinning and lightening of the skin if they are used for too long in the same area. Your physician has selected the right strength medicine for your problem and area affected on the body. Please use your medication only as directed by your physician to prevent side effects.    levocetirizine (XYZAL) 5 MG tablet - Right Upper Arm  clobetasol cream (TEMOVATE) 0.05 % - Right Upper Arm  Prurigo nodularis spinal lower back  Clobetasol cream bid  Rosacea face  Start Finacea foam Apply qd/bid face dsp 50g 5Rf. Recommend moisturizer with spf daily.  Rosacea is a chronic progressive skin condition usually affecting the face of adults, causing redness and/or acne bumps. It is treatable but not curable. It sometimes affects the eyes (ocular rosacea) as well. It may respond to topical and/or systemic medication and can flare with stress, sun exposure, alcohol, exercise and some foods.  Daily application of broad spectrum spf 30+ sunscreen to face is recommended to reduce flares.    Azelaic Acid (FINACEA) 15 % FOAM - face  Return in about 8 weeks (around 12/17/2020) for rash, rosacea.   I , CMA, am acting as scribe for 12/19/2020, MD .  Documentation: I have reviewed the above documentation for accuracy and completeness, and I agree with the above.  Cherlyn Labella  MD   

## 2020-10-22 NOTE — Patient Instructions (Addendum)
Start clobetasol cream Apply 1-2 times a day to right upper arm and lower back until improved, up to 4 weeks. May use to medial thighs once a day, no longer than 2 weeks. Avoid face, groin, underarms. Topical steroids (such as triamcinolone, fluocinolone, fluocinonide, mometasone, clobetasol, halobetasol, betamethasone, hydrocortisone) can cause thinning and lightening of the skin if they are used for too long in the same area. Your physician has selected the right strength medicine for your problem and area affected on the body. Please use your medication only as directed by your physician to prevent side effects.    Start Xyzal 5mg  take 1 by mouth every morning. (sent to [facility]) May take additional Xyzal or Allegra 180mg  mid-day.  Restart hydroxyzine 25mg  1 by mouth before bed (pt has at home)   Rosacea is a chronic progressive skin condition usually affecting the face of adults, causing redness and/or acne bumps. It is treatable but not curable. It sometimes affects the eyes (ocular rosacea) as well. It may respond to topical and/or systemic medication and can flare with stress, sun exposure, alcohol, exercise and some foods.  Daily application of broad spectrum spf 30+ sunscreen to face is recommended to reduce flares.   Effaclar - Apply a small amount at night as toelrated to white bump on face (milia)

## 2020-10-31 ENCOUNTER — Other Ambulatory Visit: Payer: Self-pay | Admitting: Hematology and Oncology

## 2020-11-04 ENCOUNTER — Other Ambulatory Visit: Payer: Self-pay

## 2020-11-04 DIAGNOSIS — D509 Iron deficiency anemia, unspecified: Secondary | ICD-10-CM

## 2020-11-06 ENCOUNTER — Other Ambulatory Visit: Payer: Self-pay

## 2020-11-06 ENCOUNTER — Inpatient Hospital Stay: Payer: BC Managed Care – PPO | Attending: Hematology and Oncology

## 2020-11-06 DIAGNOSIS — Z79899 Other long term (current) drug therapy: Secondary | ICD-10-CM | POA: Diagnosis not present

## 2020-11-06 DIAGNOSIS — E538 Deficiency of other specified B group vitamins: Secondary | ICD-10-CM | POA: Insufficient documentation

## 2020-11-06 DIAGNOSIS — D509 Iron deficiency anemia, unspecified: Secondary | ICD-10-CM | POA: Diagnosis present

## 2020-11-06 DIAGNOSIS — E039 Hypothyroidism, unspecified: Secondary | ICD-10-CM

## 2020-11-06 LAB — CBC WITH DIFFERENTIAL/PLATELET
Abs Immature Granulocytes: 0.05 10*3/uL (ref 0.00–0.07)
Basophils Absolute: 0.1 10*3/uL (ref 0.0–0.1)
Basophils Relative: 1 %
Eosinophils Absolute: 0.3 10*3/uL (ref 0.0–0.5)
Eosinophils Relative: 3 %
HCT: 39.8 % (ref 36.0–46.0)
Hemoglobin: 13.4 g/dL (ref 12.0–15.0)
Immature Granulocytes: 1 %
Lymphocytes Relative: 24 %
Lymphs Abs: 2.1 10*3/uL (ref 0.7–4.0)
MCH: 29.1 pg (ref 26.0–34.0)
MCHC: 33.7 g/dL (ref 30.0–36.0)
MCV: 86.3 fL (ref 80.0–100.0)
Monocytes Absolute: 0.7 10*3/uL (ref 0.1–1.0)
Monocytes Relative: 8 %
Neutro Abs: 5.7 10*3/uL (ref 1.7–7.7)
Neutrophils Relative %: 63 %
Platelets: 250 10*3/uL (ref 150–400)
RBC: 4.61 MIL/uL (ref 3.87–5.11)
RDW: 13.3 % (ref 11.5–15.5)
WBC: 8.9 10*3/uL (ref 4.0–10.5)
nRBC: 0 % (ref 0.0–0.2)

## 2020-11-06 LAB — FOLATE: Folate: 10.5 ng/mL (ref >5.9)

## 2020-11-06 LAB — FERRITIN: Ferritin: 29 ng/mL (ref 11–307)

## 2020-11-06 LAB — IRON AND TIBC
Iron: 66 ug/dL (ref 28–170)
Saturation Ratios: 21 % (ref 10.4–31.8)
TIBC: 322 ug/dL (ref 250–450)
UIBC: 256 ug/dL

## 2020-11-06 NOTE — Progress Notes (Signed)
[facility]  [address], [address] [place], [place] [address] Phone: [phone]  Fax: [phone]   Clinic Day:  11/07/2020  Referring physician: Valerie Roys, DO  Chief Complaint: Sharon Gibson is a 40 y.o. female with iron and B12 deficiency who is seen for 6 month assessment.   HPI: The patient was last seen in the hematology clinic on 05/08/2020. At that time, she feels "ok". She is still tired and has less energy than she would like. Ice pica had resolved. She still had restless legs. Exam was stable. Hematocrit was 40.0, hemoglobin 13.2, platelets 285,000, WBC 7,900. Ferritin was 46 with an iron saturation of 33% and a TIBC of 294. She received a vitamin B12 injection. She was taught how to administer at home B12 injections.  CBCs followed: 08/07/2020:  Hematocrit 39.9, hemoglobin 13.4, MCV 86.7, platelets 212,000, WBC 6,700. Ferritin 46. 11/06/2020:  Hematocrit 39.8, hemoglobin 13.4, MCV 86.3, platelets 250,000, WBC 8900.  Ferritin 29 with an iron saturation of 21% and a TIBC of 322.  During the interim, she has been okay. She is still exhausted and has restless legs. She sometimes gets dizzy, which she thinks may be due to her blood pressure. She has been having chest pain over the past couple of weeks which she attributes to her stress. The pain does not radiate but her chest feels heavy. She denies ice pica but states that she has a "greater desire to chew on things" like crunchy foods.  The patient states that her blood pressure always runs low but today it is lower than usual.  The patient needs a refill of at-home B12 injections. She missed last month but usually does he injections at the beginning of the month.   Past Medical History:  Diagnosis Date  . Anemia   . Anxiety   . Chicken pox   . Depression   . Eating disorder    Has had residential treatment and hospitalization previously.  Marland Kitchen Heart murmur    as a child  . History of  anemia    RECIEVES IRON INFUSIONS YEARLY  . History of pulmonary embolus (PE) 2002   bilaterally-followed by [facility] hematology-w/u negative per pt  . Hypothyroidism    no meds currently  . PTSD (post-traumatic stress disorder)     Past Surgical History:  Procedure Laterality Date  . CHOLECYSTECTOMY  2003  . EXCISION OF ENDOMETRIOMA    . GASTRIC BYPASS  2003  . LAPAROSCOPIC OVARIAN CYSTECTOMY Left 05/10/2018   Procedure: LAPAROSCOPIC OVARIAN CYSTECTOMY;  Surgeon: Homero Fellers, MD;  Location: [facility] ORS;  Service: Gynecology;  Laterality: Left;    Family History  Problem Relation Age of Onset  . Diabetes Father   . Anxiety disorder Father   . Prostate cancer Paternal Grandfather   . Alzheimer's disease Maternal Grandmother   . Alzheimer's disease Maternal Grandfather   . Lung cancer Paternal Grandmother     Social History:  reports that she has never smoked. She has never used smokeless tobacco. She reports current alcohol use. She reports that she does not use drugs. She has 1 alcohol beverage per week. She is single, but sexually active and is currently on IUD Mirena for birth control.  The patient is alone today.  Allergies:  Allergies  Allergen Reactions  . Penicillins Hives    Has patient had a PCN reaction causing immediate rash, facial/tongue/throat swelling, SOB or lightheadedness with hypotension: yes Has patient had a PCN reaction causing severe  rash involving mucus membranes or skin necrosis: no Has patient had a PCN reaction that required hospitalization: yes Has patient had a PCN reaction occurring within the last 10 years: no If all of the above answers are "NO", then may proceed with Cephalosporin use.   . Azithromycin   . Other     Narcotics- Burning in the stomach (whether PO or IV)  . Strawberry (Diagnostic) Swelling    AND WALNUTS-TONGUE SWELLING    Current Medications: Current Outpatient Medications  Medication Sig Dispense Refill  . buPROPion  (WELLBUTRIN XL) 150 MG 24 hr tablet Take 1 tablet (150 mg total) by mouth daily. 90 tablet 1  . gabapentin (NEURONTIN) 100 MG capsule Take 1 capsule (100 mg total) by mouth at bedtime. 30 capsule 3  . levonorgestrel (MIRENA) 20 MCG/24HR IUD 1 each by Intrauterine route once.    Marland Kitchen levothyroxine (SYNTHROID) 75 MCG tablet Take 1 tablet (75 mcg total) by mouth daily. 30 tablet 2  . Multiple Vitamin (MULTIVITAMIN) tablet Take 1 tablet by mouth daily.    . sertraline (ZOLOFT) 100 MG tablet Take 2 tablets (200 mg total) by mouth at bedtime. 180 tablet 1  . Azelaic Acid (FINACEA) 15 % FOAM Apply to face 1-2 times a day for rosacea. (Patient not taking: Reported on 11/07/2020) 50 g 5  . clobetasol cream (TEMOVATE) 0.05 % Apply to affected areas rash 1-2 times a day as directed. Avoid face, groin, underarms. (Patient not taking: Reported on 11/07/2020) 60 g 0  . clonazePAM (KLONOPIN) 0.5 MG tablet Take 1 tablet (0.5 mg total) by mouth 2 (two) times daily as needed for anxiety. 60 tablet 1  . Cyanocobalamin (B-12 COMPLIANCE INJECTION) 1000 MCG/ML KIT Inject 1,000 mcg as directed every 30 (thirty) days. (Patient not taking: Reported on 11/07/2020) 1 kit 3  . EPINEPHrine 0.3 mg/0.3 mL IJ SOAJ injection SMARTSIG:0.3 Milliliter(s) IM Once PRN (Patient not taking: No sig reported)    . levocetirizine (XYZAL) 5 MG tablet Take 1 tablet (5 mg total) by mouth every evening. (Patient not taking: Reported on 11/07/2020) 30 tablet 3   No current facility-administered medications for this visit.    Review of Systems  Constitutional: Positive for malaise/fatigue (exhausted) and weight loss (16 lbs). Negative for chills, diaphoresis and fever.  HENT: Negative.  Negative for congestion, ear discharge, ear pain, hearing loss, nosebleeds, sinus pain, sore throat and tinnitus.   Eyes: Negative.  Negative for blurred vision, double vision, photophobia and pain.  Respiratory: Negative.  Negative for cough, hemoptysis, sputum  production and shortness of breath.   Cardiovascular: Positive for chest pain (comes and goes). Negative for palpitations and leg swelling.  Gastrointestinal: Negative.  Negative for abdominal pain, blood in stool, constipation, diarrhea, heartburn, melena, nausea and vomiting.       Denies ice pica.  Genitourinary: Negative.  Negative for dysuria, frequency, hematuria and urgency.  Musculoskeletal: Negative.  Negative for back pain, joint pain, myalgias and neck pain.  Skin: Negative.  Negative for itching and rash.  Neurological: Positive for dizziness. Negative for tingling, sensory change, speech change, focal weakness, weakness and headaches.       Restless legs.  Endo/Heme/Allergies: Negative.  Negative for environmental allergies. Does not bruise/bleed easily.  Psychiatric/Behavioral: Negative.  Negative for depression and memory loss. The patient is not nervous/anxious and does not have insomnia.   All other systems reviewed and are negative.  Performance status (ECOG): 1  Vitals Blood pressure (!) 91/53, pulse 76, temperature (!) 97.3 F (  36.3 C), temperature source Tympanic, resp. rate 16, weight 288 lb 14.6 oz (131.1 kg), SpO2 100 %.   Physical Exam Vitals and nursing note reviewed.  Constitutional:      General: She is not in acute distress.    Appearance: She is well-developed. She is not diaphoretic.  HENT:     Head: Normocephalic and atraumatic.     Mouth/Throat:     Mouth: Mucous membranes are moist.     Pharynx: Oropharynx is clear. No oropharyngeal exudate.  Eyes:     General: No scleral icterus.    Conjunctiva/sclera: Conjunctivae normal.     Pupils: Pupils are equal, round, and reactive to light.     Comments: Glasses.  Blue eyes.  Neck:     Vascular: No JVD.  Cardiovascular:     Rate and Rhythm: Normal rate and regular rhythm.     Heart sounds: Normal heart sounds. No murmur heard.   Pulmonary:     Effort: Pulmonary effort is normal. No respiratory  distress.     Breath sounds: Normal breath sounds. No wheezing or rales.  Chest:     Chest wall: No tenderness.  Breasts:     Right: No supraclavicular adenopathy.     Left: No supraclavicular adenopathy.    Abdominal:     General: Bowel sounds are normal. There is no distension.     Palpations: Abdomen is soft. There is no mass.     Tenderness: There is no abdominal tenderness. There is no guarding or rebound.  Musculoskeletal:        General: No swelling or tenderness. Normal range of motion.     Cervical back: Normal range of motion and neck supple.  Lymphadenopathy:     Head:     Right side of head: No preauricular, posterior auricular or occipital adenopathy.     Left side of head: No preauricular, posterior auricular or occipital adenopathy.     Cervical: No cervical adenopathy.     Upper Body:     Right upper body: No supraclavicular adenopathy.     Left upper body: No supraclavicular adenopathy.     Lower Body: No right inguinal adenopathy. No left inguinal adenopathy.  Skin:    General: Skin is warm and dry.     Coloration: Skin is not pale.     Findings: No erythema or rash.  Neurological:     Mental Status: She is alert and oriented to person, place, and time.  Psychiatric:        Behavior: Behavior normal.        Thought Content: Thought content normal.        Judgment: Judgment normal.    Appointment on 11/06/2020  Component Date Value Ref Range Status  . Folate 11/06/2020 10.5  >5.9 ng/mL Final   Performed at [facility], [address] [place], [place] [address]  . Ferritin 11/06/2020 29  11 - 307 ng/mL Final   Performed at [facility], [address] [place], [place] [address]  . Iron 11/06/2020 66  28 - 170 ug/dL Final  . TIBC 11/06/2020 322  250 - 450 ug/dL Final  . Saturation Ratios 11/06/2020 21  10.4 - 31.8 % Final  . UIBC 11/06/2020 256  ug/dL Final   Performed at [facility], [address] [place], [place]  [address]  . WBC 11/06/2020 8.9  4.0 - 10.5 K/uL Final  . RBC 11/06/2020 4.61  3.87 - 5.11 MIL/uL Final  .  Hemoglobin 11/06/2020 13.4  12.0 - 15.0 g/dL Final  . HCT 11/06/2020 39.8  36.0 - 46.0 % Final  . MCV 11/06/2020 86.3  80.0 - 100.0 fL Final  . MCH 11/06/2020 29.1  26.0 - 34.0 pg Final  . MCHC 11/06/2020 33.7  30.0 - 36.0 g/dL Final  . RDW 11/06/2020 13.3  11.5 - 15.5 % Final  . Platelets 11/06/2020 250  150 - 400 K/uL Final  . nRBC 11/06/2020 0.0  0.0 - 0.2 % Final  . Neutrophils Relative % 11/06/2020 63  % Final  . Neutro Abs 11/06/2020 5.7  1.7 - 7.7 K/uL Final  . Lymphocytes Relative 11/06/2020 24  % Final  . Lymphs Abs 11/06/2020 2.1  0.7 - 4.0 K/uL Final  . Monocytes Relative 11/06/2020 8  % Final  . Monocytes Absolute 11/06/2020 0.7  0.1 - 1.0 K/uL Final  . Eosinophils Relative 11/06/2020 3  % Final  . Eosinophils Absolute 11/06/2020 0.3  0.0 - 0.5 K/uL Final  . Basophils Relative 11/06/2020 1  % Final  . Basophils Absolute 11/06/2020 0.1  0.0 - 0.1 K/uL Final  . Immature Granulocytes 11/06/2020 1  % Final  . Abs Immature Granulocytes 11/06/2020 0.05  0.00 - 0.07 K/uL Final   Performed at [facility], [address], [place], [place] [address]    Assessment:  Kalla Watson is a 40 y.o. female with B12 and iron deficiency anemia s/p gastric bypass surgery(2004). She has received IV iron(different preparations) x 5 ([place], [place]; [place], [place]; [place], [place]) with reactionsrequiring Benadryl and steroids.  Labs on 03/20/2016 revealed a normal hematocrit but iron deficiency (ferritin 8). B12 was 185 (low). She began B12 on 04/07/2016.  She received Venofer200 mg IV with premedications (Tylenol, Benadryl, and Decadron) on 05/13/2016. She had side effectsof dizziness, lightheadedness, low blood pressure, excess sweating, aches, and pains, nausea, and headache after her infusion.  She has received Venofer: 12/31/2016 and weekly x 3 (10/27/2018 -  11/10/2018).  She received Feraheme on 10/25/2019.  Ferritinhas been followed: 7.9 on06/23/2017, 8 on07/08/2016, 22 on 07/08/2016, 18 on01/06/2017, 10 on03/21/2018,13 on01/17/2020, 53 on 12/07/2018, 26 on 02/21/2019, 24 on 03/16/2019, 16 on 10/24/2019, 47 on 01/23/2020, 75 on 02/01/2020, and 46 on 05/07/2020.  She has B12 deficiency.B12 was 323 on 10/14/2018, 231 on 10/25/2019, and 442 on 02/01/2020.  She receives B12 monthly (last 01/23/2020).  She has a history ofmultiple pulmonary emboliin 2003. Per patient report, hypercoagulable work-upwas negative. She was on Coumadinx 6 months.  She has a history ofeasy bruising and heavy menses. She has anIUD.  The patient received the [place] COVID-19 vaccine on 12/05/2019 and 12/26/2019.  Symptomatically, she feels okay. She is still exhausted and has restless legs. She sometimes gets dizzy, which she thinks may be due to her blood pressure. She hashad chest pain over the past couple of weeks which she attributes to her stress. The pain does not radiate but her chest feels heavy. She denies ice pica but states that she has a "greater desire to chew on things" like crunchy foods.  Exam is stable.  Plan: 1.   Review labs from 11/06/2020 2.   Iron deficiency Hematocrit  39.8. Hemoglobin 13.4.MCV 86.3. Ferritin  29. Iron saturation  21 % with a TIBC 322.             Ferritin goal 100.  She receives IV iron if her ferritin is < 30.  Symptomatically, she feels exhausted and has restless legs.  She receives premeds with Feraheme.                         Tylenol 650 mg po x 1.   Benadryl 50 mg po x 1.   Decadron 10 mg IV.  Alroy Bailiff for today and weekly x1 (total 2) with urine pregnancy test. 3. B12 deficiency B12 was 442 on 02/01/2020. Patient receives B12 at home.  Rx: B12 injection kit. 4.   Venofer today and weekly x 1 (total 2) with urine pregnancy test. 5.   RTC  in 3 months for labs (CBC with diff, ferritin). 6.   RTC in 6 months for MD assess, labs (CBC with diff, ferritin, iron studies, folate- day before) and +/- Venofer.  I discussed the assessment and treatment plan with the patient.  The patient was provided an opportunity to ask questions and all were answered.  The patient agreed with the plan and demonstrated an understanding of the instructions.  The patient was advised to call back if the symptoms worsen or if the condition fails to improve as anticipated.  I provided 15 minutes of face-to-face time during this this encounter and > 50% was spent counseling as documented under my assessment and plan.  An additional 5 minutes were spent reviewing her chart (Epic and Care Everywhere) including notes, labs, and imaging studies.    Lequita Asal, MD, PhD    11/07/2020, 2:47 PM  I, Mirian Mo Tufford, am acting as a Education administrator for Lequita Asal, MD.  I, [place] Mike Gip, MD, have reviewed the above documentation for accuracy and completeness, and I agree with the above.

## 2020-11-07 ENCOUNTER — Other Ambulatory Visit: Payer: Self-pay

## 2020-11-07 ENCOUNTER — Encounter: Payer: Self-pay | Admitting: Hematology and Oncology

## 2020-11-07 ENCOUNTER — Inpatient Hospital Stay: Payer: BC Managed Care – PPO

## 2020-11-07 ENCOUNTER — Inpatient Hospital Stay (HOSPITAL_BASED_OUTPATIENT_CLINIC_OR_DEPARTMENT_OTHER): Payer: BC Managed Care – PPO | Admitting: Hematology and Oncology

## 2020-11-07 VITALS — BP 91/53 | HR 76 | Temp 97.3°F | Resp 16 | Wt 288.9 lb

## 2020-11-07 VITALS — BP 117/73 | HR 78

## 2020-11-07 DIAGNOSIS — E538 Deficiency of other specified B group vitamins: Secondary | ICD-10-CM | POA: Diagnosis not present

## 2020-11-07 DIAGNOSIS — D509 Iron deficiency anemia, unspecified: Secondary | ICD-10-CM | POA: Diagnosis not present

## 2020-11-07 LAB — PREGNANCY, URINE: Preg Test, Ur: NEGATIVE

## 2020-11-07 MED ORDER — ACETAMINOPHEN 325 MG PO TABS
650.0000 mg | ORAL_TABLET | Freq: Once | ORAL | Status: AC
  Administered 2020-11-07: 650 mg via ORAL
  Filled 2020-11-07: qty 2

## 2020-11-07 MED ORDER — SODIUM CHLORIDE 0.9 % IV SOLN
10.0000 mg | Freq: Once | INTRAVENOUS | Status: AC
  Administered 2020-11-07: 10 mg via INTRAVENOUS
  Filled 2020-11-07: qty 10

## 2020-11-07 MED ORDER — SODIUM CHLORIDE 0.9 % IV SOLN: 200.0000 mg | Freq: Once | INTRAVENOUS | Status: DC

## 2020-11-07 MED ORDER — DIPHENHYDRAMINE HCL 25 MG PO CAPS
ORAL_CAPSULE | ORAL | Status: AC
  Filled 2020-11-07: qty 1

## 2020-11-07 MED ORDER — SODIUM CHLORIDE 0.9 % IV SOLN
Freq: Once | INTRAVENOUS | Status: AC
  Filled 2020-11-07: qty 250

## 2020-11-07 MED ORDER — B-12 COMPLIANCE INJECTION 1000 MCG/ML IJ KIT: 1000.0000 ug | PACK | INTRAMUSCULAR | 3 refills | Status: DC

## 2020-11-07 MED ORDER — DIPHENHYDRAMINE HCL 25 MG PO TABS
50.0000 mg | ORAL_TABLET | Freq: Once | ORAL | Status: AC
  Administered 2020-11-07: 50 mg via ORAL
  Filled 2020-11-07: qty 2

## 2020-11-07 MED ORDER — IRON SUCROSE 20 MG/ML IV SOLN
200.0000 mg | Freq: Once | INTRAVENOUS | Status: AC
  Administered 2020-11-07: 200 mg via INTRAVENOUS
  Filled 2020-11-07: qty 10

## 2020-11-07 NOTE — Progress Notes (Signed)
Patient received prescribed treatment in clinic. IV Venofer and premeds with IV decadron and PO Tylenol and Benadryl. Tolerated well. Patient stable at discharge.

## 2020-11-11 ENCOUNTER — Encounter: Payer: Self-pay | Admitting: Family Medicine

## 2020-11-11 ENCOUNTER — Ambulatory Visit (INDEPENDENT_AMBULATORY_CARE_PROVIDER_SITE_OTHER): Payer: BC Managed Care – PPO | Admitting: Family Medicine

## 2020-11-11 ENCOUNTER — Other Ambulatory Visit: Payer: Self-pay

## 2020-11-11 VITALS — BP 110/68 | HR 65 | Temp 98.6°F

## 2020-11-11 DIAGNOSIS — E039 Hypothyroidism, unspecified: Secondary | ICD-10-CM | POA: Diagnosis not present

## 2020-11-11 DIAGNOSIS — F411 Generalized anxiety disorder: Secondary | ICD-10-CM

## 2020-11-11 MED ORDER — GABAPENTIN 100 MG PO CAPS: 100.0000 mg | ORAL_CAPSULE | Freq: Every day | ORAL | 1 refills | Status: DC

## 2020-11-11 MED ORDER — CLONAZEPAM 0.5 MG PO TABS: 0.5000 mg | ORAL_TABLET | Freq: Two times a day (BID) | ORAL | 1 refills | Status: DC | PRN

## 2020-11-11 NOTE — Progress Notes (Signed)
BP 110/68   Pulse 65   Temp 98.6 F (37 C) (Oral)   SpO2 98%    Subjective:    Patient ID: Sharon Gibson, female    DOB: [birth year], 40 y.o.   MRN: [id number]  HPI: Roselia Snipe is a 40 y.o. female  Chief Complaint  Patient presents with  . Hypothyroidism  . Depression   HYPOTHYROIDISM Thyroid control status: unsure Satisfied with current treatment? yes Medication side effects: no Medication compliance: good compliance Recent dose adjustment:yes Fatigue: yes Cold intolerance: no Heat intolerance: no Weight gain: no Weight loss: no Constipation: no Diarrhea/loose stools: yes Palpitations: no Lower extremity edema: no Anxiety/depressed mood: yes- feels like her anxiety is a bit worse  ANXIETY/DEPRESSION Duration: chronic Status:exacerbated Anxious mood: yes  Excessive worrying: yes Irritability: no  Sweating: no Nausea: no Palpitations:no Hyperventilation: no Panic attacks: no Agoraphobia: no  Obscessions/compulsions: no Depressed mood: yes Depression screen [facility] 2/9 11/11/2020 08/16/2020 02/01/2020 12/11/2019 06/06/2019  Decreased Interest 0 2 2 2 1   Down, Depressed, Hopeless 0 2 2 1 1   PHQ - 2 Score 0 4 4 3 2   Altered sleeping 0 3 3 2  0  Tired, decreased energy 0 3 3 3  0  Change in appetite 0 3 3 3 3   Feeling bad or failure about yourself  0 2 3 2 2   Trouble concentrating 0 2 3 1  0  Moving slowly or fidgety/restless 0 0 3 0 0  Suicidal thoughts 0 0 1 1 0  PHQ-9 Score 0 17 23 15 7   Difficult doing work/chores - Somewhat difficult Very difficult Very difficult Not difficult at all   Anhedonia: no Weight changes: no Insomnia: no   Hypersomnia: no Fatigue/loss of energy: yes Feelings of worthlessness: no Feelings of guilt: no Impaired concentration/indecisiveness: no Suicidal ideations: no  Crying spells: no Recent Stressors/Life Changes: no   Relationship problems: no   Family stress: no     Financial stress: no    Job stress:yes      Recent death/loss: no   Relevant past medical, surgical, family and social history reviewed and updated as indicated. Interim medical history since our last visit reviewed. Allergies and medications reviewed and updated.  Review of Systems  Constitutional: Negative.   Respiratory: Negative.   Cardiovascular: Negative.   Gastrointestinal: Negative.   Musculoskeletal: Negative.   Skin: Negative.   Neurological: Negative.   Psychiatric/Behavioral: Negative for agitation, behavioral problems, confusion, decreased concentration, dysphoric mood, hallucinations, self-injury, sleep disturbance and suicidal ideas. The patient is nervous/anxious. The patient is not hyperactive.     Per HPI unless specifically indicated above     Objective:    BP 110/68   Pulse 65   Temp 98.6 F (37 C) (Oral)   SpO2 98%   Wt Readings from Last 3 Encounters:  11/07/20 288 lb 14.6 oz (131.1 kg)  05/20/20 280 lb (127 kg)  05/08/20 (!) 304 lb 3.8 oz (138 kg)    Physical Exam Vitals and nursing note reviewed.  Constitutional:      General: She is not in acute distress.    Appearance: Normal appearance. She is not ill-appearing, toxic-appearing or diaphoretic.  HENT:     Head: Normocephalic and atraumatic.     Right Ear: External ear normal.     Left Ear: External ear normal.     Nose: Nose normal.     Mouth/Throat:     Mouth: Mucous membranes are moist.     Pharynx: Oropharynx is clear.  Eyes:     General: No scleral icterus.       Right eye: No discharge.        Left eye: No discharge.     Extraocular Movements: Extraocular movements intact.     Conjunctiva/sclera: Conjunctivae normal.     Pupils: Pupils are equal, round, and reactive to light.  Cardiovascular:     Rate and Rhythm: Normal rate and regular rhythm.     Pulses: Normal pulses.     Heart sounds: Normal heart sounds. No murmur heard. No friction rub. No gallop.   Pulmonary:     Effort: Pulmonary effort is normal. No respiratory  distress.     Breath sounds: Normal breath sounds. No stridor. No wheezing, rhonchi or rales.  Chest:     Chest wall: No tenderness.  Musculoskeletal:        General: Normal range of motion.     Cervical back: Normal range of motion and neck supple.  Skin:    General: Skin is warm and dry.     Capillary Refill: Capillary refill takes less than 2 seconds.     Coloration: Skin is not jaundiced or pale.     Findings: No bruising, erythema, lesion or rash.  Neurological:     General: No focal deficit present.     Mental Status: She is alert and oriented to person, place, and time. Mental status is at baseline.  Psychiatric:        Mood and Affect: Mood normal.        Behavior: Behavior normal.        Thought Content: Thought content normal.        Judgment: Judgment normal.     Results for orders placed or performed in visit on 11/07/20  Pregnancy, urine  Result Value Ref Range   Preg Test, Ur NEGATIVE NEGATIVE      Assessment & Plan:   Problem List Items Addressed This Visit      Endocrine   Hypothyroidism - Primary    Due for recheck on labs. Will check today and treat as needed. Call with any concerns.       Relevant Orders   TSH   TSH     Other   Generalized anxiety disorder    Under good control on current regimen. Continue current regimen. Continue to monitor. Call with any concerns. Refills given on her klonopin today. Refills should last 3+ months. Call with any concerns.             Follow up plan: Return in about 6 weeks (around 12/23/2020).

## 2020-11-11 NOTE — Assessment & Plan Note (Signed)
Due for recheck on labs. Will check today and treat as needed. Call with any concerns.

## 2020-11-11 NOTE — Assessment & Plan Note (Signed)
Under good control on current regimen. Continue current regimen. Continue to monitor. Call with any concerns. Refills given on her klonopin today. Refills should last 3+ months. Call with any concerns.

## 2020-11-14 ENCOUNTER — Other Ambulatory Visit: Payer: Self-pay

## 2020-11-14 ENCOUNTER — Encounter: Payer: Self-pay | Admitting: Family Medicine

## 2020-11-14 ENCOUNTER — Inpatient Hospital Stay: Payer: BC Managed Care – PPO

## 2020-11-14 VITALS — BP 125/79 | HR 66 | Temp 97.2°F | Resp 16

## 2020-11-14 DIAGNOSIS — E039 Hypothyroidism, unspecified: Secondary | ICD-10-CM

## 2020-11-14 DIAGNOSIS — D509 Iron deficiency anemia, unspecified: Secondary | ICD-10-CM | POA: Diagnosis not present

## 2020-11-14 LAB — TSH: TSH: 3.997 u[IU]/mL (ref 0.350–4.500)

## 2020-11-14 LAB — PREGNANCY, URINE: Preg Test, Ur: NEGATIVE

## 2020-11-14 MED ORDER — SODIUM CHLORIDE 0.9 % IV SOLN
10.0000 mg | Freq: Once | INTRAVENOUS | Status: AC
  Administered 2020-11-14: 10 mg via INTRAVENOUS
  Filled 2020-11-14: qty 1

## 2020-11-14 MED ORDER — SODIUM CHLORIDE 0.9 % IV SOLN
200.0000 mg | Freq: Once | INTRAVENOUS | Status: AC
  Administered 2020-11-14: 200 mg via INTRAVENOUS
  Filled 2020-11-14: qty 10

## 2020-11-14 MED ORDER — ACETAMINOPHEN 325 MG PO TABS
ORAL_TABLET | ORAL | Status: AC
  Filled 2020-11-14: qty 2

## 2020-11-14 MED ORDER — ACETAMINOPHEN 325 MG PO TABS
650.0000 mg | ORAL_TABLET | Freq: Once | ORAL | Status: AC
  Administered 2020-11-14: 650 mg via ORAL

## 2020-11-14 MED ORDER — DIPHENHYDRAMINE HCL 25 MG PO TABS
50.0000 mg | ORAL_TABLET | Freq: Once | ORAL | Status: AC
  Administered 2020-11-14: 50 mg via ORAL
  Filled 2020-11-14: qty 2

## 2020-11-14 MED ORDER — SODIUM CHLORIDE 0.9 % IV SOLN
Freq: Once | INTRAVENOUS | Status: AC
  Filled 2020-11-14: qty 250

## 2020-11-14 NOTE — Progress Notes (Signed)
Patient received prescribed treatment in clinic. IV Venofer with premeds. Tolerated well. Patient stable at discharge.

## 2020-11-17 ENCOUNTER — Other Ambulatory Visit: Payer: Self-pay | Admitting: Family Medicine

## 2020-11-17 DIAGNOSIS — E039 Hypothyroidism, unspecified: Secondary | ICD-10-CM

## 2020-11-17 MED ORDER — LEVOTHYROXINE SODIUM 100 MCG PO TABS: 100.0000 ug | ORAL_TABLET | Freq: Every day | ORAL | 1 refills | Status: DC

## 2020-12-16 ENCOUNTER — Encounter: Payer: Self-pay | Admitting: Family Medicine

## 2020-12-17 ENCOUNTER — Other Ambulatory Visit: Payer: Self-pay

## 2020-12-17 ENCOUNTER — Ambulatory Visit: Payer: BC Managed Care – PPO | Admitting: Family Medicine

## 2020-12-17 ENCOUNTER — Encounter: Payer: Self-pay | Admitting: Family Medicine

## 2020-12-17 ENCOUNTER — Ambulatory Visit: Payer: BC Managed Care – PPO | Admitting: Dermatology

## 2020-12-17 VITALS — BP 104/60 | HR 63 | Temp 98.1°F

## 2020-12-17 DIAGNOSIS — R300 Dysuria: Secondary | ICD-10-CM

## 2020-12-17 DIAGNOSIS — N3001 Acute cystitis with hematuria: Secondary | ICD-10-CM | POA: Diagnosis not present

## 2020-12-17 MED ORDER — NITROFURANTOIN MONOHYD MACRO 100 MG PO CAPS: 100.0000 mg | ORAL_CAPSULE | Freq: Two times a day (BID) | ORAL | 0 refills | Status: DC

## 2020-12-17 NOTE — Progress Notes (Signed)
BP 104/60   Pulse 63   Temp 98.1 F (36.7 C)   SpO2 97%    Subjective:    Patient ID: Sharon Gibson, female    DOB: Mar 12, 1981, 40 y.o.   MRN: [id number]  HPI: Sharon Gibson is a 40 y.o. female  Chief Complaint  Patient presents with  . Urinary Tract Infection    Patient states she started having burning with urination since yesterday. Patient states within the last week she noticed an odor to her urine.    URINARY SYMPTOMS Duration: yesterday Dysuria: burning Urinary frequency: yes Urgency: yes Small volume voids: yes Symptom severity: moderate Urinary incontinence: no Foul odor: yes Hematuria: yes Abdominal pain: yes Back pain: no Suprapubic pain/pressure: yes Flank pain: no Fever:  no Vomiting: no Relief with cranberry juice: no Relief with pyridium: no Status: better/worse/stable Previous urinary tract infection: yes Recurrent urinary tract infection: no Sexual activity: No sexually active/monogomous/practicing safe sex History of sexually transmitted disease: no Vaginal discharge: no Treatments attempted: pyridium and increasing fluids   Relevant past medical, surgical, family and social history reviewed and updated as indicated. Interim medical history since our last visit reviewed. Allergies and medications reviewed and updated.  Review of Systems  Constitutional: Negative.   Respiratory: Negative.   Cardiovascular: Negative.   Gastrointestinal: Negative.   Genitourinary: Positive for dysuria, frequency and urgency. Negative for decreased urine volume, difficulty urinating, dyspareunia, enuresis, flank pain, genital sores, hematuria, menstrual problem, pelvic pain, vaginal bleeding, vaginal discharge and vaginal pain.  Musculoskeletal: Negative.   Psychiatric/Behavioral: Negative.     Per HPI unless specifically indicated above     Objective:    BP 104/60   Pulse 63   Temp 98.1 F (36.7 C)   SpO2 97%   Wt Readings from Last 3  Encounters:  11/07/20 288 lb 14.6 oz (131.1 kg)  05/20/20 280 lb (127 kg)  05/08/20 (!) 304 lb 3.8 oz (138 kg)    Physical Exam Vitals and nursing note reviewed.  Constitutional:      General: She is not in acute distress.    Appearance: Normal appearance. She is not ill-appearing, toxic-appearing or diaphoretic.  HENT:     Head: Normocephalic and atraumatic.     Right Ear: External ear normal.     Left Ear: External ear normal.     Nose: Nose normal.     Mouth/Throat:     Mouth: Mucous membranes are moist.     Pharynx: Oropharynx is clear.  Eyes:     General: No scleral icterus.       Right eye: No discharge.        Left eye: No discharge.     Extraocular Movements: Extraocular movements intact.     Conjunctiva/sclera: Conjunctivae normal.     Pupils: Pupils are equal, round, and reactive to light.  Cardiovascular:     Rate and Rhythm: Normal rate and regular rhythm.     Pulses: Normal pulses.     Heart sounds: Normal heart sounds. No murmur heard. No friction rub. No gallop.   Pulmonary:     Effort: Pulmonary effort is normal. No respiratory distress.     Breath sounds: Normal breath sounds. No stridor. No wheezing, rhonchi or rales.  Chest:     Chest wall: No tenderness.  Musculoskeletal:        General: Normal range of motion.     Cervical back: Normal range of motion and neck supple.  Skin:    General: Skin  is warm and dry.     Capillary Refill: Capillary refill takes less than 2 seconds.     Coloration: Skin is not jaundiced or pale.     Findings: No bruising, erythema, lesion or rash.  Neurological:     General: No focal deficit present.     Mental Status: She is alert and oriented to person, place, and time. Mental status is at baseline.  Psychiatric:        Mood and Affect: Mood normal.        Behavior: Behavior normal.        Thought Content: Thought content normal.        Judgment: Judgment normal.     Results for orders placed or performed in visit on  11/14/20  TSH  Result Value Ref Range   TSH 3.997 0.350 - 4.500 uIU/mL  Pregnancy, urine  Result Value Ref Range   Preg Test, Ur NEGATIVE NEGATIVE      Assessment & Plan:   Problem List Items Addressed This Visit   None   Visit Diagnoses    Acute cystitis with hematuria    -  Primary   + leuks and 2+ blood- will treat with macrobid. Call if not getting better or getting worse.    Dysuria       + leuks and 2+ blood- will treat with macrobid. Call if not getting better or getting worse.    Relevant Orders   Urinalysis, Routine w reflex microscopic   Urine Culture       Follow up plan: Return if symptoms worsen or fail to improve.

## 2020-12-18 LAB — URINALYSIS, ROUTINE W REFLEX MICROSCOPIC
Bilirubin, UA: NEGATIVE
Glucose, UA: NEGATIVE
Ketones, UA: NEGATIVE
Nitrite, UA: NEGATIVE
Protein,UA: NEGATIVE
Specific Gravity, UA: 1.02 (ref 1.005–1.030)
Urobilinogen, Ur: 0.2 mg/dL (ref 0.2–1.0)
pH, UA: 6.5 (ref 5.0–7.5)

## 2020-12-18 LAB — MICROSCOPIC EXAMINATION: Bacteria, UA: NONE SEEN

## 2020-12-23 ENCOUNTER — Other Ambulatory Visit: Payer: Self-pay

## 2020-12-23 ENCOUNTER — Encounter: Payer: Self-pay | Admitting: Family Medicine

## 2020-12-23 ENCOUNTER — Ambulatory Visit: Payer: BC Managed Care – PPO | Admitting: Family Medicine

## 2020-12-23 VITALS — BP 113/69 | HR 81 | Temp 98.5°F

## 2020-12-23 DIAGNOSIS — N12 Tubulo-interstitial nephritis, not specified as acute or chronic: Secondary | ICD-10-CM | POA: Diagnosis not present

## 2020-12-23 DIAGNOSIS — R300 Dysuria: Secondary | ICD-10-CM | POA: Diagnosis not present

## 2020-12-23 MED ORDER — CIPROFLOXACIN HCL 500 MG PO TABS: 500.0000 mg | ORAL_TABLET | Freq: Two times a day (BID) | ORAL | 0 refills | Status: DC

## 2020-12-23 NOTE — Progress Notes (Signed)
BP 113/69   Pulse 81   Temp 98.5 F (36.9 C)   SpO2 97%    Subjective:    Patient ID: Sharon Gibson, female    DOB: 31-Jul-1981, 40 y.o.   MRN: [id number]  HPI: Sharon Gibson is a 40 y.o. female  Chief Complaint  Patient presents with  . Urinary Tract Infection    Patient states she has had low grade temp. Patient is currently on antiobiotics   URINARY SYMPTOMS Duration: 1 week Dysuria: yes Urinary frequency: yes Urgency: yes Small volume voids: no Symptom severity: severe Urinary incontinence: no Foul odor: yes Hematuria: no Abdominal pain: yes Back pain: yes Suprapubic pain/pressure: no Flank pain: yes Fever:  yes, chills and sweats Vomiting: no Relief with cranberry juice: no Relief with pyridium: no Status: better/worse/stable Previous urinary tract infection: yes Recurrent urinary tract infection: no Vaginal discharge: no Treatments attempted: antibiotics, pyridium, cranberry and increasing fluids   Relevant past medical, surgical, family and social history reviewed and updated as indicated. Interim medical history since our last visit reviewed. Allergies and medications reviewed and updated.  Review of Systems  Constitutional: Positive for appetite change, chills, diaphoresis, fatigue and fever. Negative for activity change and unexpected weight change.  Respiratory: Negative.   Cardiovascular: Negative.   Gastrointestinal: Negative.   Genitourinary: Positive for dysuria, frequency, hematuria and urgency. Negative for decreased urine volume, difficulty urinating, dyspareunia, enuresis, flank pain, genital sores, menstrual problem, pelvic pain, vaginal bleeding, vaginal discharge and vaginal pain.  Musculoskeletal: Negative.   Neurological: Negative.   Psychiatric/Behavioral: Negative.     Per HPI unless specifically indicated above     Objective:    BP 113/69   Pulse 81   Temp 98.5 F (36.9 C)   SpO2 97%   Wt Readings from Last 3  Encounters:  11/07/20 288 lb 14.6 oz (131.1 kg)  05/20/20 280 lb (127 kg)  05/08/20 (!) 304 lb 3.8 oz (138 kg)    Physical Exam Vitals and nursing note reviewed.  Constitutional:      General: She is not in acute distress.    Appearance: Normal appearance. She is ill-appearing. She is not toxic-appearing or diaphoretic.  HENT:     Head: Normocephalic and atraumatic.     Right Ear: External ear normal.     Left Ear: External ear normal.     Nose: Nose normal.     Mouth/Throat:     Mouth: Mucous membranes are moist.     Pharynx: Oropharynx is clear.  Eyes:     General: No scleral icterus.       Right eye: No discharge.        Left eye: No discharge.     Extraocular Movements: Extraocular movements intact.     Conjunctiva/sclera: Conjunctivae normal.     Pupils: Pupils are equal, round, and reactive to light.  Cardiovascular:     Rate and Rhythm: Normal rate and regular rhythm.     Pulses: Normal pulses.     Heart sounds: Normal heart sounds. No murmur heard. No friction rub. No gallop.   Pulmonary:     Effort: Pulmonary effort is normal. No respiratory distress.     Breath sounds: Normal breath sounds. No stridor. No wheezing, rhonchi or rales.  Chest:     Chest wall: No tenderness.  Musculoskeletal:        General: Normal range of motion.     Cervical back: Normal range of motion and neck supple.  Skin:  General: Skin is warm and dry.     Capillary Refill: Capillary refill takes less than 2 seconds.     Coloration: Skin is not jaundiced or pale.     Findings: No bruising, erythema, lesion or rash.  Neurological:     General: No focal deficit present.     Mental Status: She is alert and oriented to person, place, and time. Mental status is at baseline.  Psychiatric:        Mood and Affect: Mood normal.        Behavior: Behavior normal.        Thought Content: Thought content normal.        Judgment: Judgment normal.     Results for orders placed or performed in  visit on 12/17/20  Microscopic Examination   Urine  Result Value Ref Range   WBC, UA 6-10 (A) 0 - 5 /hpf   RBC 3-10 (A) 0 - 2 /hpf   Epithelial Cells (non renal) 0-10 0 - 10 /hpf   Bacteria, UA None seen None seen/Few  Urinalysis, Routine w reflex microscopic  Result Value Ref Range   Specific Gravity, UA 1.020 1.005 - 1.030   pH, UA 6.5 5.0 - 7.5   Color, UA Yellow Yellow   Appearance Ur Cloudy (A) Clear   Leukocytes,UA 1+ (A) Negative   Protein,UA Negative Negative/Trace   Glucose, UA Negative Negative   Ketones, UA Negative Negative   RBC, UA 2+ (A) Negative   Bilirubin, UA Negative Negative   Urobilinogen, Ur 0.2 0.2 - 1.0 mg/dL   Nitrite, UA Negative Negative   Microscopic Examination See below:       Assessment & Plan:   Problem List Items Addressed This Visit   None   Visit Diagnoses    Pyelonephritis    -  Primary   Cx last week did not go out. Will change abx to cipro. Warning signs to go to ER discussed. Continue to monitor closely.   Dysuria       Relevant Orders   Urinalysis, Routine w reflex microscopic   Urine Culture       Follow up plan: Return if symptoms worsen or fail to improve.

## 2020-12-24 LAB — MICROSCOPIC EXAMINATION: Bacteria, UA: NONE SEEN

## 2020-12-24 LAB — URINALYSIS, ROUTINE W REFLEX MICROSCOPIC
Bilirubin, UA: NEGATIVE
Glucose, UA: NEGATIVE
Ketones, UA: NEGATIVE
Nitrite, UA: NEGATIVE
Protein,UA: NEGATIVE
Specific Gravity, UA: >1.03 — ABNORMAL HIGH (ref 1.005–1.030)
Urobilinogen, Ur: 0.2 mg/dL (ref 0.2–1.0)
pH, UA: 6 (ref 5.0–7.5)

## 2020-12-25 LAB — URINE CULTURE

## 2021-01-14 ENCOUNTER — Encounter: Payer: Self-pay | Admitting: Obstetrics and Gynecology

## 2021-02-04 ENCOUNTER — Other Ambulatory Visit: Payer: Self-pay

## 2021-02-04 ENCOUNTER — Inpatient Hospital Stay: Payer: BC Managed Care – PPO | Attending: Nurse Practitioner

## 2021-02-04 DIAGNOSIS — D509 Iron deficiency anemia, unspecified: Secondary | ICD-10-CM

## 2021-02-05 ENCOUNTER — Other Ambulatory Visit: Payer: BC Managed Care – PPO

## 2021-02-11 ENCOUNTER — Encounter: Payer: BC Managed Care – PPO | Admitting: Family Medicine

## 2021-03-18 ENCOUNTER — Telehealth (HOSPITAL_COMMUNITY): Payer: Self-pay | Admitting: Licensed Clinical Social Worker

## 2021-03-19 ENCOUNTER — Encounter: Payer: Self-pay | Admitting: Hematology and Oncology

## 2021-03-20 ENCOUNTER — Other Ambulatory Visit: Payer: Self-pay

## 2021-03-20 ENCOUNTER — Other Ambulatory Visit (HOSPITAL_COMMUNITY): Payer: BC Managed Care – PPO | Attending: Psychiatry | Admitting: Licensed Clinical Social Worker

## 2021-03-20 DIAGNOSIS — F411 Generalized anxiety disorder: Secondary | ICD-10-CM | POA: Insufficient documentation

## 2021-03-20 DIAGNOSIS — F332 Major depressive disorder, recurrent severe without psychotic features: Secondary | ICD-10-CM

## 2021-03-20 DIAGNOSIS — Z91410 Personal history of adult physical and sexual abuse: Secondary | ICD-10-CM | POA: Insufficient documentation

## 2021-03-20 DIAGNOSIS — R45851 Suicidal ideations: Secondary | ICD-10-CM | POA: Insufficient documentation

## 2021-03-20 DIAGNOSIS — F32A Depression, unspecified: Secondary | ICD-10-CM | POA: Insufficient documentation

## 2021-03-20 DIAGNOSIS — Z880 Allergy status to penicillin: Secondary | ICD-10-CM | POA: Insufficient documentation

## 2021-03-20 DIAGNOSIS — F4310 Post-traumatic stress disorder, unspecified: Secondary | ICD-10-CM

## 2021-03-20 DIAGNOSIS — Z818 Family history of other mental and behavioral disorders: Secondary | ICD-10-CM | POA: Insufficient documentation

## 2021-03-20 DIAGNOSIS — F329 Major depressive disorder, single episode, unspecified: Secondary | ICD-10-CM | POA: Insufficient documentation

## 2021-03-21 ENCOUNTER — Other Ambulatory Visit (HOSPITAL_COMMUNITY): Payer: BC Managed Care – PPO | Admitting: Occupational Therapy

## 2021-03-21 ENCOUNTER — Other Ambulatory Visit (HOSPITAL_COMMUNITY): Payer: BC Managed Care – PPO | Admitting: Licensed Clinical Social Worker

## 2021-03-21 ENCOUNTER — Other Ambulatory Visit: Payer: Self-pay

## 2021-03-21 ENCOUNTER — Encounter (HOSPITAL_COMMUNITY): Payer: Self-pay

## 2021-03-21 DIAGNOSIS — F411 Generalized anxiety disorder: Secondary | ICD-10-CM | POA: Diagnosis not present

## 2021-03-21 DIAGNOSIS — F332 Major depressive disorder, recurrent severe without psychotic features: Secondary | ICD-10-CM

## 2021-03-21 DIAGNOSIS — F329 Major depressive disorder, single episode, unspecified: Secondary | ICD-10-CM | POA: Diagnosis not present

## 2021-03-21 DIAGNOSIS — Z91410 Personal history of adult physical and sexual abuse: Secondary | ICD-10-CM | POA: Diagnosis not present

## 2021-03-21 DIAGNOSIS — R4589 Other symptoms and signs involving emotional state: Secondary | ICD-10-CM

## 2021-03-21 DIAGNOSIS — Z818 Family history of other mental and behavioral disorders: Secondary | ICD-10-CM | POA: Diagnosis not present

## 2021-03-21 DIAGNOSIS — F32A Depression, unspecified: Secondary | ICD-10-CM | POA: Diagnosis not present

## 2021-03-21 DIAGNOSIS — R45851 Suicidal ideations: Secondary | ICD-10-CM | POA: Diagnosis not present

## 2021-03-21 DIAGNOSIS — R41844 Frontal lobe and executive function deficit: Secondary | ICD-10-CM

## 2021-03-21 DIAGNOSIS — Z880 Allergy status to penicillin: Secondary | ICD-10-CM | POA: Diagnosis not present

## 2021-03-21 NOTE — Therapy (Signed)
[facility] [address] [place], [place], [address] Phone: [phone]   Fax:  [phone] Virtual Visit via Video Note  I connected with Sharon Gibson on 03/21/21 at  9:00 AM EDT by a video enabled telemedicine application and verified that I am speaking with the correct person using two identifiers.  Location: Patient: Patient Home Provider: Clinic Office   I discussed the limitations of evaluation and management by telemedicine and the availability of in person appointments. The patient expressed understanding and agreed to proceed.   I discussed the assessment and treatment plan with the patient. The patient was provided an opportunity to ask questions and all were answered. The patient agreed with the plan and demonstrated an understanding of the instructions.   The patient was advised to call back or seek an in-person evaluation if the symptoms worsen or if the condition fails to improve as anticipated.  I provided 20 minutes of non-face-to-face time during this encounter. OT Evaluation  Donne Hazelassidy Rhandi Despain, OT   Occupational Therapy Evaluation  Patient Details  Name: Sharon Gibson MRN: [id number] Date of Birth: [birth year] No data recorded  Encounter Date: 03/21/2021   OT End of Session - 03/21/21 1103     Visit Number 1    Number of Visits 20    Date for OT Re-Evaluation 04/18/21    Authorization Type [employer]    OT Start Time 0903    OT Stop Time 0923    OT Time Calculation (min) 20 min    Activity Tolerance Patient tolerated treatment well    Behavior During Therapy WFL for tasks assessed/performed             Past Medical History:  Diagnosis Date   Anemia    Anxiety    Chicken pox    Depression    Eating disorder    Has had residential treatment and hospitalization previously.   Heart murmur    as a child   History of anemia    RECIEVES IRON INFUSIONS YEARLY   History of pulmonary  embolus (PE) 2002   bilaterally-followed by [facility] hematology-w/u negative per pt   Hypothyroidism    no meds currently   PTSD (post-traumatic stress disorder)     Past Surgical History:  Procedure Laterality Date   CHOLECYSTECTOMY  2003   EXCISION OF ENDOMETRIOMA     GASTRIC BYPASS  2003   LAPAROSCOPIC OVARIAN CYSTECTOMY Left 05/10/2018   Procedure: LAPAROSCOPIC OVARIAN CYSTECTOMY;  Surgeon: Natale MilchSchuman, Christanna R, MD;  Location: [facility] ORS;  Service: Gynecology;  Laterality: Left;    There were no vitals filed for this visit.   Subjective Assessment - 03/21/21 1103     Currently in Pain? No/denies                                OT Education - 03/21/21 1103     Education Details Educated on OT role within [employer] programming    Person(s) Educated Patient    Methods Explanation;Handout    Comprehension Verbalized understanding              OT Short Term Goals - 03/21/21 1104       OT SHORT TERM GOAL #1   Title Pt will actively engage in OT group sessions throughout duration of PHP programming, in order to promote daily structure, social engagement, and opportunities to develop and utilize adaptive strategies to maximize  functional performance in preparation for safe transition and integration back into school, work, and the community.    Time 4    Period Weeks    Status New    Target Date 04/18/21      OT SHORT TERM GOAL #2   Title Pt will identify a minimum of two self-soothing relaxation strategies she can utilize in the moment, to safely manage increased anxiety/PTSD flashbacks, without engaging in self-harm behaviors,  in preparation for safe community reintegration.    Time 4    Period Weeks    Status New    Target Date 04/18/21      OT SHORT TERM GOAL #3   Title Pt will identify and re-engage in 2-3 ADL/iADL routines, as it relates to re-engagement in self-care activities, in order to promote re-establishment of daily routines in preparation  for reintegration back into the community.    Time 4    Period Weeks    Status New    Target Date 04/18/21            Occupational Therapy Assessment 03/21/2021  Sharon Gibson is a 40 y/o female with PMHx of depression, anxiety, bulimia, and PTSD who was referred to the [facility] program from a residential program, the [facility] in [place] where she recently completed a 28 day program. Pt reports difficulty managing her mental health, reporting panic attacks, trauma flashbacks, and not feeling safe. 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 Pt reports difficulty with ADL/iADLs and not wanting to leave the house. Pt reports a desire to engage in PHP programming in order to manage identified stressors and to engage meaningfully in identified areas of occupation and ADL/iADLs. Upon approach, pt presents calm, cooperative, and forthcoming with information throughout OT evaluation. Pt reports enjoying meditation and diamond art and identifies goal for admission "feel safe".   Precautions/Limitations: None noted/observed  Cognition: Appears intact    Visual Motor: Pt reports wearing glasses for visual deficits   Living Situation: Pt lives alone with her jack russel Lily; recently returned from a 28 residential treatment facility in [place]  School/Work: Pt works an office job FT, reports she feels safe there/enjoys the people, however hates the work  ADL/iADL Performance: Pt reports difficulty leaving the house and when she does, often wears a 'mask' and portrays herself as put together, however notes when she does not have to leave the house she has difficult engaging in basic self-care and hygiene. Pt also has hx eating disorder and reports she is currently managing this, however has fluctuation in appetite and eating patterns.   Leisure Interests and Hobbies: Enjoys Insurance risk surveyor and meditation  Social Support: Mom, Dad, and two friends    What do you do when you  are very stressed, angry, upset, sad or anxious? Isolate from others, Cry, Talk to someone, and Sleep   What helps when you are not feeling well? Deep breathing, Going for a walk, Eating something, and Playing a game  What are some things that make it MORE difficult for you when you are already upset? Being touched, Not having choices/input, Bedroom door being open, Loud noises, People staring at me, and Yelling  Is there anything specific that you would like help with while you're in the partial hospitalization program? Coping Skills, Relationships, Stress Management, Self-esteem , and Finances  What is your goal while you are here?  "Feel safe"  Assessment: Pt demonstrates behavior that inhibits/restricts participation in occupation  and would benefit from skilled occupational therapy services to address current difficulties with symptom management, emotion regulation, socialization, stress management, time management, job readiness, financial wellness, health and nutrition, sleep hygiene, ADL/iADL performance and leisure participation, in preparation for reintegration and return to community at discharge.   Plan: Pt will participate in skilled occupational therapy sessions (group and/or individual) in order to promote daily structure, social engagement, and opportunities to develop and utilize adaptive strategies to maximize functional performance in preparation for safe transition and integration back into school, work, and/or the community at discharge. OT sessions will occur 4-5 x per week for 2-4 weeks.   Donne Hazel, MOT, OTR/L  Plan - 03/21/21 1104     Clinical Impression Statement Sharon Gibson is a 40 y/o female with PMHx of depression, anxiety, bulimia, and PTSD who was referred to the [facility] program from a residential program, the [facility] in [place] where she recently completed a 28 day program. Pt reports difficulty managing her mental health, reporting panic attacks, trauma flashbacks, and  not feeling safe. 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 Pt reports difficulty with ADL/iADLs and not wanting to leave the house. Pt reports a desire to engage in PHP programming in order to manage identified stressors and to engage meaningfully in identified areas of occupation and ADL/iADLs.    OT Occupational Profile and History Problem Focused Assessment - Including review of records relating to presenting problem    Occupational performance deficits (Please refer to evaluation for details): ADL's;IADL's;Rest and Sleep;Education;Work;Leisure;Social Participation    Body Structure / Function / Physical Skills ADL    Cognitive Skills Attention;Emotional;Energy/Drive;Learn;Memory;Perception;Problem Solve;Safety Awareness;Temperament/Personality;Thought;Understand    Psychosocial Skills Coping Strategies;Environmental  Adaptations;Habits;Interpersonal Interaction;Routines and Behaviors    Rehab Potential Good    Clinical Decision Making Limited treatment options, no task modification necessary    Comorbidities Affecting Occupational Performance: May have comorbidities impacting occupational performance    Modification or Assistance to Complete Evaluation  No modification of tasks or assist necessary to complete eval    OT Frequency 5x / week    OT Duration 4 weeks    OT Treatment/Interventions Self-care/ADL training;Patient/family education;Coping strategies training;Psychosocial skills training    Consulted and Agree with Plan of Care Patient             Patient will benefit from skilled therapeutic intervention in order to improve the following deficits and impairments:   Body Structure / Function / Physical Skills: ADL Cognitive Skills: Attention, Emotional, Energy/Drive, Learn, Memory, Perception, Problem Solve, Safety Awareness, Temperament/Personality, Thought, Understand Psychosocial Skills: Coping Strategies, Environmental   Adaptations, Habits, Interpersonal Interaction, Routines and Behaviors   Visit Diagnosis: Difficulty coping  Frontal lobe and executive function deficit  Severe episode of recurrent major depressive disorder, without psychotic features (HCC)    Problem List Patient Active Problem List   Diagnosis Date Noted   Cervical strain 11/28/2019   Endometriosis determined by laparoscopy 06/13/2018   Ovarian cyst 03/01/2018   Hx pulmonary embolism 02/03/2018   Rash 10/16/2016   Generalized anxiety disorder 10/12/2016   Right knee pain 09/02/2016   Severe recurrent major depression without psychotic features (HCC) 07/23/2016   Bulimia nervosa, in partial remission, mild 07/23/2016    Class: Chronic   Iron deficiency anemia 04/08/2016   B12 deficiency 04/08/2016   S/P gastric bypass 03/20/2016   History of eating disorder 03/20/2016   Insomnia 03/20/2016   PTSD (post-traumatic stress disorder) 03/20/2016   Hypothyroidism 03/20/2016  PCOS (polycystic ovarian syndrome) 12/14/2014    03/21/2021  Donne Hazel, MOT, OTR/L  03/21/2021, 11:06 AM  [facility] HOSPITALIZATION PROGRAM [address] [place], [place], [address] Phone: [phone]   Fax:  [phone]  Name: Sharon Gibson MRN: [id number] Date of Birth: [birth year]
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
## 2021-03-22 ENCOUNTER — Encounter (HOSPITAL_COMMUNITY): Payer: Self-pay | Admitting: Family

## 2021-03-24 ENCOUNTER — Other Ambulatory Visit (HOSPITAL_COMMUNITY): Payer: BC Managed Care – PPO | Admitting: Licensed Clinical Social Worker

## 2021-03-24 ENCOUNTER — Encounter (HOSPITAL_COMMUNITY): Payer: Self-pay

## 2021-03-24 ENCOUNTER — Other Ambulatory Visit (HOSPITAL_COMMUNITY): Payer: BC Managed Care – PPO | Admitting: Occupational Therapy

## 2021-03-24 ENCOUNTER — Other Ambulatory Visit: Payer: Self-pay

## 2021-03-24 DIAGNOSIS — F332 Major depressive disorder, recurrent severe without psychotic features: Secondary | ICD-10-CM

## 2021-03-24 DIAGNOSIS — R41844 Frontal lobe and executive function deficit: Secondary | ICD-10-CM

## 2021-03-24 DIAGNOSIS — F4310 Post-traumatic stress disorder, unspecified: Secondary | ICD-10-CM

## 2021-03-24 DIAGNOSIS — F329 Major depressive disorder, single episode, unspecified: Secondary | ICD-10-CM | POA: Diagnosis not present

## 2021-03-24 DIAGNOSIS — R4589 Other symptoms and signs involving emotional state: Secondary | ICD-10-CM

## 2021-03-24 DIAGNOSIS — F411 Generalized anxiety disorder: Secondary | ICD-10-CM

## 2021-03-24 NOTE — Therapy (Signed)
[facility] PARTIAL HOSPITALIZATION PROGRAM [address] [place], [place], [address] Phone: [phone]   Fax:  [phone] Virtual Visit via Video Note  I connected with Sharon Gibson on 03/24/21 at  11:00 AM EDT by a video enabled telemedicine application and verified that I am speaking with the correct person using two identifiers.  Location: Patient: Patient Home Provider: Clinic Office   I discussed the limitations of evaluation and management by telemedicine and the availability of in person appointments. The patient expressed understanding and agreed to proceed.   I discussed the assessment and treatment plan with the patient. The patient was provided an opportunity to ask questions and all were answered. The patient agreed with the plan and demonstrated an understanding of the instructions.   The patient was advised to call back or seek an in-person evaluation if the symptoms worsen or if the condition fails to improve as anticipated.  I provided 60 minutes of non-face-to-face time during this encounter.   Donne Hazel, OT   Occupational Therapy Treatment  Patient Details  Name: Sharon Gibson MRN: [id number] Date of Birth: [birth year] Referring Provider (OT): Hillery Jacks   Encounter Date: 03/24/2021   OT End of Session - 03/24/21 1344     Visit Number 2    Number of Visits 20    Date for OT Re-Evaluation 04/18/21    Authorization Type BCBS    OT Start Time 1115    OT Stop Time 1215    OT Time Calculation (min) 60 min    Activity Tolerance Patient tolerated treatment well    Behavior During Therapy WFL for tasks assessed/performed             Past Medical History:  Diagnosis Date   Anemia    Anxiety    Chicken pox    Depression    Eating disorder    Has had residential treatment and hospitalization previously.   Heart murmur    as a child   History of anemia    RECIEVES IRON INFUSIONS YEARLY   History of  pulmonary embolus (PE) 2002   bilaterally-followed by [facility] hematology-w/u negative per pt   Hypothyroidism    no meds currently   PTSD (post-traumatic stress disorder)     Past Surgical History:  Procedure Laterality Date   CHOLECYSTECTOMY  2003   EXCISION OF ENDOMETRIOMA     GASTRIC BYPASS  2003   LAPAROSCOPIC OVARIAN CYSTECTOMY Left 05/10/2018   Procedure: LAPAROSCOPIC OVARIAN CYSTECTOMY;  Surgeon: Natale Milch, MD;  Location: [facility] ORS;  Service: Gynecology;  Laterality: Left;    There were no vitals filed for this visit.   Subjective Assessment - 03/24/21 1343     Currently in Pain? No/denies                   OT Education - 03/24/21 1343     Education Details Educated on self-care and provided tips/strategies to improving specific areas of self-care as it relates to one's mental health    Person(s) Educated Patient    Methods Explanation;Handout    Comprehension Verbalized understanding              OT Short Term Goals - 03/24/21 1344       OT SHORT TERM GOAL #1   Status On-going      OT SHORT TERM GOAL #2   Status On-going      OT SHORT TERM GOAL #3   Status On-going  Group Session:  S: "I feel guilty when I engage in self-care, because I think it's selfish, but no one else has ever told me that."  O: Today's group session focused on the topic of self-care and group member completed a self-care assessment that identified specific categories within self-care that needed improvement, including physical, emotional/psychological, social, spiritual, and professional. Discussion focused on identifying which areas need the most work/improvement and brainstormed strategies and tips to improve self-care. Group members were also encouraged to identify strengths and areas of improvement.     A: Sharon Gibson was active and independent in her participation of discussion and activity, sharing her current self-care strengths are following a meal  plan that was set up for her and her eating disorder when she was at residential. She identified areas of improvement as setting boundaries, social self-care overall, and taking time off when she needs to rest (and not feel guilty about it). Receptive and open to support and additional feedback provided to her during group session.   P: Continue to attend PHP OT group sessions 5x week for 3 weeks to promote daily structure, social engagement, and opportunities to develop and utilize adaptive strategies to maximize functional performance in preparation for safe transition and integration back into school, work, and the community. Plan to address topic of communication in next OT group session.  Plan - 03/24/21 1344     Occupational performance deficits (Please refer to evaluation for details): ADL's;IADL's;Rest and Sleep;Education;Work;Leisure;Social Participation    Body Structure / Function / Physical Skills ADL    Cognitive Skills Attention;Emotional;Energy/Drive;Learn;Memory;Perception;Problem Solve;Safety Awareness;Temperament/Personality;Thought;Understand    Psychosocial Skills Coping Strategies;Environmental  Adaptations;Habits;Interpersonal Interaction;Routines and Behaviors             Patient will benefit from skilled therapeutic intervention in order to improve the following deficits and impairments:   Body Structure / Function / Physical Skills: ADL Cognitive Skills: Attention, Emotional, Energy/Drive, Learn, Memory, Perception, Problem Solve, Safety Awareness, Temperament/Personality, Thought, Understand Psychosocial Skills: Coping Strategies, Environmental  Adaptations, Habits, Interpersonal Interaction, Routines and Behaviors   Visit Diagnosis: Difficulty coping  Frontal lobe and executive function deficit  Severe episode of recurrent major depressive disorder, without psychotic features (HCC)    Problem List Patient Active Problem List   Diagnosis Date Noted    Cervical strain 11/28/2019   Endometriosis determined by laparoscopy 06/13/2018   Ovarian cyst 03/01/2018   Hx pulmonary embolism 02/03/2018   Rash 10/16/2016   Generalized anxiety disorder 10/12/2016   Right knee pain 09/02/2016   Severe recurrent major depression without psychotic features (HCC) 07/23/2016   Bulimia nervosa, in partial remission, mild 07/23/2016    Class: Chronic   Iron deficiency anemia 04/08/2016   B12 deficiency 04/08/2016   S/P gastric bypass 03/20/2016   History of eating disorder 03/20/2016   Insomnia 03/20/2016   PTSD (post-traumatic stress disorder) 03/20/2016   Hypothyroidism 03/20/2016   PCOS (polycystic ovarian syndrome) 12/14/2014    03/24/2021  Donne Hazel, MOT, OTR/L  03/24/2021, 1:44 PM  [facility] PARTIAL HOSPITALIZATION PROGRAM [address] [place], [place], [address] Phone: [phone]   Fax:  [phone]  Name: Sharon Gibson MRN: [id number] Date of Birth: December 31, 1980

## 2021-03-25 ENCOUNTER — Other Ambulatory Visit (HOSPITAL_COMMUNITY): Payer: BC Managed Care – PPO | Admitting: Licensed Clinical Social Worker

## 2021-03-25 ENCOUNTER — Other Ambulatory Visit (HOSPITAL_COMMUNITY): Payer: BC Managed Care – PPO | Admitting: Occupational Therapy

## 2021-03-25 ENCOUNTER — Other Ambulatory Visit: Payer: Self-pay

## 2021-03-25 ENCOUNTER — Encounter (HOSPITAL_COMMUNITY): Payer: Self-pay

## 2021-03-25 DIAGNOSIS — R4589 Other symptoms and signs involving emotional state: Secondary | ICD-10-CM

## 2021-03-25 DIAGNOSIS — F4310 Post-traumatic stress disorder, unspecified: Secondary | ICD-10-CM

## 2021-03-25 DIAGNOSIS — R41844 Frontal lobe and executive function deficit: Secondary | ICD-10-CM

## 2021-03-25 DIAGNOSIS — F411 Generalized anxiety disorder: Secondary | ICD-10-CM

## 2021-03-25 DIAGNOSIS — F332 Major depressive disorder, recurrent severe without psychotic features: Secondary | ICD-10-CM

## 2021-03-25 DIAGNOSIS — F329 Major depressive disorder, single episode, unspecified: Secondary | ICD-10-CM | POA: Diagnosis not present

## 2021-03-25 NOTE — Therapy (Signed)
[facility] PARTIAL HOSPITALIZATION PROGRAM [address] [place], [place], [address] Phone: [phone]   Fax:  [phone] Virtual Visit via Video Note  I connected with [name] on 03/25/21 at  11:00 AM EDT by a video enabled telemedicine application and verified that I am speaking with the correct person using two identifiers.  Location: Patient: Patient Home Provider: Clinic Office   I discussed the limitations of evaluation and management by telemedicine and the availability of in person appointments. The patient expressed understanding and agreed to proceed.   I discussed the assessment and treatment plan with the patient. The patient was provided an opportunity to ask questions and all were answered. The patient agreed with the plan and demonstrated an understanding of the instructions.   The patient was advised to call back or seek an in-person evaluation if the symptoms worsen or if the condition fails to improve as anticipated.  I provided 60 minutes of non-face-to-face time during this encounter.   Donne Hazel, OT   Occupational Therapy Treatment  Patient Details  Name: Sharon Gibson MRN: [id number] Date of Birth: September 21, 1981 Referring Provider (OT): Hillery Jacks   Encounter Date: 03/25/2021   OT End of Session - 03/25/21 1434     Visit Number 3    Number of Visits 20    Date for OT Re-Evaluation 04/18/21    Authorization Type BCBS    OT Start Time 1105    OT Stop Time 1205    OT Time Calculation (min) 60 min    Activity Tolerance Patient tolerated treatment well    Behavior During Therapy WFL for tasks assessed/performed             Past Medical History:  Diagnosis Date   Anemia    Anxiety    Chicken pox    Depression    Eating disorder    Has had residential treatment and hospitalization previously.   Heart murmur    as a child   History of anemia    RECIEVES IRON INFUSIONS YEARLY   History of  pulmonary embolus (PE) 2002   bilaterally-followed by [facility] hematology-w/u negative per pt   Hypothyroidism    no meds currently   PTSD (post-traumatic stress disorder)     Past Surgical History:  Procedure Laterality Date   CHOLECYSTECTOMY  2003   EXCISION OF ENDOMETRIOMA     GASTRIC BYPASS  2003   LAPAROSCOPIC OVARIAN CYSTECTOMY Left 05/10/2018   Procedure: LAPAROSCOPIC OVARIAN CYSTECTOMY;  Surgeon: Natale Milch, MD;  Location: [facility] ORS;  Service: Gynecology;  Laterality: Left;    There were no vitals filed for this visit.   Subjective Assessment - 03/25/21 1433     Currently in Pain? No/denies               OT Education - 03/25/21 1433     Education Details Educated on different communication styles and identified strategies/tips to practice being more assertive    Person(s) Educated Patient    Methods Explanation;Handout    Comprehension Verbalized understanding              OT Short Term Goals - 03/24/21 1344       OT SHORT TERM GOAL #1   Status On-going      OT SHORT TERM GOAL #2   Status On-going      OT SHORT TERM GOAL #3   Status On-going           Group  Session:  S: "I'm definitely a passive communicator and put on a mask, so much so that my nickname at work, ironically is 'sunshine."  O: Group began with a reflection from previous OT session on self-care. Today's group focused on topic of Communication Styles. Group members were educated on the different styles including passive, aggressive, and assertive communication. Members shared and reflected on which style they most often find themselves communicating in and how to transition to a more assertive approach.   A: Marciel was active and independent in her participation of discussion and activity, sharing that she wears a mask and has difficulty expressing and communicating her feelings. 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 Appeared receptive to education, support provided from peers, and listening to additional strategies to improve communication.   P: Continue to attend PHP OT group sessions 5x week for 2 weeks to promote daily structure, social engagement, and opportunities to develop and utilize adaptive strategies to maximize functional performance in preparation for safe transition and integration back into school, work, and the community. Plan to address topic of assertiveness in next OT group session.  Plan - 03/25/21 1434     Occupational performance deficits (Please refer to evaluation for details): ADL's;IADL's;Rest and Sleep;Education;Work;Leisure;Social Participation    Body Structure / Function / Physical Skills ADL    Cognitive Skills Attention;Emotional;Energy/Drive;Learn;Memory;Perception;Problem Solve;Safety Awareness;Temperament/Personality;Thought;Understand    Psychosocial Skills Coping Strategies;Environmental  Adaptations;Habits;Interpersonal Interaction;Routines and Behaviors             Patient will benefit from skilled therapeutic intervention in order to improve the following deficits and impairments:   Body Structure / Function / Physical Skills: ADL Cognitive Skills: Attention, Emotional, Energy/Drive, Learn, Memory, Perception, Problem Solve, Safety Awareness, Temperament/Personality, Thought, Understand Psychosocial Skills: Coping Strategies, Environmental  Adaptations, Habits, Interpersonal Interaction, Routines and Behaviors   Visit Diagnosis: Difficulty coping  Frontal lobe and executive function deficit  Severe episode of recurrent major depressive disorder, without psychotic features (HCC)    Problem List Patient Active Problem List   Diagnosis Date Noted   Cervical strain 11/28/2019   Endometriosis determined by laparoscopy 06/13/2018   Ovarian cyst 03/01/2018    Hx pulmonary embolism 02/03/2018   Rash 10/16/2016   Generalized anxiety disorder 10/12/2016   Right knee pain 09/02/2016   Severe recurrent major depression without psychotic features (HCC) 07/23/2016   Bulimia nervosa, in partial remission, mild 07/23/2016    Class: Chronic   Iron deficiency anemia 04/08/2016   B12 deficiency 04/08/2016   S/P gastric bypass 03/20/2016   History of eating disorder 03/20/2016   Insomnia 03/20/2016   PTSD (post-traumatic stress disorder) 03/20/2016   Hypothyroidism 03/20/2016   PCOS (polycystic ovarian syndrome) 12/14/2014    03/25/2021  Donne Hazel, MOT, OTR/L 03/25/2021, 2:35 PM  [facility] PARTIAL HOSPITALIZATION PROGRAM [address] [place], [place], [address] Phone: [phone]   Fax:  [phone]  Name: Saran Laviolette MRN: [id number] Date of Birth: [birth year]

## 2021-03-25 NOTE — Progress Notes (Signed)
Spoke with patient via Webex video call, used 2 identifiers to correctly identify patient. She has done PHP in the past and was recently in a residential program that referred her back to [facility]. She has been diagnosed with PTSD, GAD, and MDD. She denies feeling depressed but admits to hoplessness. Has thoughts of suicide every other day but has no plan or intent. Last attempt was many years ago. Has daily chest pain and was told that its a tight muscle causing the pain. Feels in a state of constant anxiety and panic. Never feels safe and has lots of nightmares. On scale 1-10 as 10 being worst she rates depression at 0 and anxiety at 10. Denies HI or AV hallucinations. PHQ9=26. No issues or complaints. No side effects from medications.

## 2021-03-26 ENCOUNTER — Encounter: Payer: BC Managed Care – PPO | Admitting: Family Medicine

## 2021-03-26 ENCOUNTER — Other Ambulatory Visit (HOSPITAL_COMMUNITY): Payer: BC Managed Care – PPO | Admitting: Occupational Therapy

## 2021-03-26 ENCOUNTER — Other Ambulatory Visit (HOSPITAL_COMMUNITY): Payer: BC Managed Care – PPO | Admitting: Licensed Clinical Social Worker

## 2021-03-26 ENCOUNTER — Other Ambulatory Visit: Payer: Self-pay

## 2021-03-26 ENCOUNTER — Encounter (HOSPITAL_COMMUNITY): Payer: Self-pay

## 2021-03-26 DIAGNOSIS — R41844 Frontal lobe and executive function deficit: Secondary | ICD-10-CM

## 2021-03-26 DIAGNOSIS — F332 Major depressive disorder, recurrent severe without psychotic features: Secondary | ICD-10-CM

## 2021-03-26 DIAGNOSIS — F329 Major depressive disorder, single episode, unspecified: Secondary | ICD-10-CM | POA: Diagnosis not present

## 2021-03-26 DIAGNOSIS — F4310 Post-traumatic stress disorder, unspecified: Secondary | ICD-10-CM

## 2021-03-26 DIAGNOSIS — R4589 Other symptoms and signs involving emotional state: Secondary | ICD-10-CM

## 2021-03-26 NOTE — Therapy (Signed)
[facility] PARTIAL HOSPITALIZATION PROGRAM [address] [place], [place], [address] Phone: [phone]   Fax:  [phone] Virtual Visit via Video Note  I connected with Sharon Gibson on 03/26/21 at  12:00 PM EDT by a video enabled telemedicine application and verified that I am speaking with the correct person using two identifiers.  Location: Patient: Patient Home Provider: Clinic Office   I discussed the limitations of evaluation and management by telemedicine and the availability of in person appointments. The patient expressed understanding and agreed to proceed.   I discussed the assessment and treatment plan with the patient. The patient was provided an opportunity to ask questions and all were answered. The patient agreed with the plan and demonstrated an understanding of the instructions.   The patient was advised to call back or seek an in-person evaluation if the symptoms worsen or if the condition fails to improve as anticipated.  I provided 50 minutes of non-face-to-face time during this encounter.   Sharon Gibson, OT   Occupational Therapy Treatment  Patient Details  Name: Sharon Gibson MRN: [id number] Date of Birth: July 09, 1981 Referring Provider (OT): Hillery Jacks   Encounter Date: 03/26/2021   OT End of Session - 03/26/21 1432     Visit Number 4    Number of Visits 20    Date for OT Re-Evaluation 04/18/21    Authorization Type BCBS    OT Start Time 1200    OT Stop Time 1250    OT Time Calculation (min) 50 min    Activity Tolerance Patient tolerated treatment well    Behavior During Therapy WFL for tasks assessed/performed             Past Medical History:  Diagnosis Date   Anemia    Anxiety    Chicken pox    Depression    Eating disorder    Has had residential treatment and hospitalization previously.   Heart murmur    as a child   History of anemia    RECIEVES IRON INFUSIONS YEARLY   History of  pulmonary embolus (PE) 2002   bilaterally-followed by [facility] hematology-w/u negative per pt   Hypothyroidism    no meds currently   PTSD (post-traumatic stress disorder)     Past Surgical History:  Procedure Laterality Date   CHOLECYSTECTOMY  2003   EXCISION OF ENDOMETRIOMA     GASTRIC BYPASS  2003   LAPAROSCOPIC OVARIAN CYSTECTOMY Left 05/10/2018   Procedure: LAPAROSCOPIC OVARIAN CYSTECTOMY;  Surgeon: Natale Milch, MD;  Location: [facility] ORS;  Service: Gynecology;  Laterality: Left;    There were no vitals filed for this visit.   Subjective Assessment - 03/26/21 1431     Currently in Pain? No/denies              OT Education - 03/26/21 1431     Education Details Continued education on different communication styles with strategies to become more assertive with use of XYZ communication tool    Person(s) Educated Patient    Methods Explanation;Handout    Comprehension Verbalized understanding              OT Short Term Goals - 03/24/21 1344       OT SHORT TERM GOAL #1   Status On-going      OT SHORT TERM GOAL #2   Status On-going      OT SHORT TERM GOAL #3   Status On-going  Group Session:  S: "My coworkers can be very nosey and I feel obligated to answer their questions, even if I don't want to or feel uncomfortable doing so."  O: Group began with a reflection from previous OT session focused on communication styles and group members re-iterated what was learned during previous session. Members shared and reflected on any opportunities they were presented with last evening to practice their assertiveness skills or recognize patterns of communication observed. Today's group focused on assertiveness skills training and use of the XYZ* assertive communication tool was introduced. The XYZ communication tool states: I feel X when you do Y in situation Z and I would like _________. X is the emotion, Y is the specific behavior, and Z is the specific  situation. Group members each formulated their own XYZ statement and shared with the group to discuss and offer feedback. Additional tips and strategies to practice being assertive were also introduced and discussed.   A: Sharon Gibson was active and independent in her participation of discussion and activity, nodding along in agreement and asking appropriate questions. She asked how to set boundaries at work or answer peers when she does not want too, but feels obligated. She gave the example of coworkers asking why she has been out and proved receptive to strategy to set boundary and repeat blanket statement "I was taking a personal day" to establish a boundary that she does not want to share more. Pt appeared open and receptive to use of XYZ formula and expressed interest in utilizing assertive tips reviewed in the future.   P: Continue to attend PHP OT group sessions 5x week for 2 weeks to promote daily structure, social engagement, and opportunities to develop and utilize adaptive strategies to maximize functional performance in preparation for safe transition and integration back into school, work, and the community. Plan to address topic of stress management in next OT group session.  Plan - 03/26/21 1432     Occupational performance deficits (Please refer to evaluation for details): ADL's;IADL's;Rest and Sleep;Education;Work;Leisure;Social Participation    Body Structure / Function / Physical Skills ADL    Cognitive Skills Attention;Emotional;Energy/Drive;Learn;Memory;Perception;Problem Solve;Safety Awareness;Temperament/Personality;Thought;Understand    Psychosocial Skills Coping Strategies;Environmental  Adaptations;Habits;Interpersonal Interaction;Routines and Behaviors             Patient will benefit from skilled therapeutic intervention in order to improve the following deficits and impairments:   Body Structure / Function / Physical Skills: ADL Cognitive Skills: Attention, Emotional,  Energy/Drive, Learn, Memory, Perception, Problem Solve, Safety Awareness, Temperament/Personality, Thought, Understand Psychosocial Skills: Coping Strategies, Environmental  Adaptations, Habits, Interpersonal Interaction, Routines and Behaviors   Visit Diagnosis: Difficulty coping  Frontal lobe and executive function deficit  Severe episode of recurrent major depressive disorder, without psychotic features (HCC)    Problem List Patient Active Problem List   Diagnosis Date Noted   Cervical strain 11/28/2019   Endometriosis determined by laparoscopy 06/13/2018   Ovarian cyst 03/01/2018   Hx pulmonary embolism 02/03/2018   Rash 10/16/2016   Generalized anxiety disorder 10/12/2016   Right knee pain 09/02/2016   Severe recurrent major depression without psychotic features (HCC) 07/23/2016   Bulimia nervosa, in partial remission, mild 07/23/2016    Class: Chronic   Iron deficiency anemia 04/08/2016   B12 deficiency 04/08/2016   S/P gastric bypass 03/20/2016   History of eating disorder 03/20/2016   Insomnia 03/20/2016   PTSD (post-traumatic stress disorder) 03/20/2016   Hypothyroidism 03/20/2016   PCOS (polycystic ovarian syndrome) 12/14/2014    03/26/2021  Sharon Gibson, MOT, OTR/L  03/26/2021, 2:35 PM  [facility] HOSPITALIZATION PROGRAM [address] [place], [place], [address] Phone: [phone]   Fax:  [phone]  Name: Sharon Gibson MRN: [id number] Date of Birth: 02/25/1981

## 2021-03-26 NOTE — Psych (Signed)
Virtual Visit via Video Note  I connected with Sharon Gibson on 03/21/21 at  9:00 AM EDT by a video enabled telemedicine application and verified that I am speaking with the correct person using two identifiers.  Location: Patient: patient home Provider: clinical home office   I discussed the limitations of evaluation and management by telemedicine and the availability of in person appointments. The patient expressed understanding and agreed to proceed.  I discussed the assessment and treatment plan with the patient. The patient was provided an opportunity to ask questions and all were answered. The patient agreed with the plan and demonstrated an understanding of the instructions.   The patient was advised to call back or seek an in-person evaluation if the symptoms worsen or if the condition fails to improve as anticipated.   Pt completed PHP intake process. Cln oriented pt to [facility] and answered any questions regarding treatment. Pt gives verbal consent to treatment, group commitments, and virtual treatment. Cln and pt completed treatment plan and pt states verbal alignment with plan.  Pt completed admission eval with NP T. Lewis and completed OT eval with cln C. Cycenas during this intake.  Pt will begin group sessions on 03/24/21 at 9am.   I provided 60 minutes of non-face-to-face time during this encounter.   Donia Guiles, LCSW

## 2021-03-27 ENCOUNTER — Other Ambulatory Visit (HOSPITAL_COMMUNITY): Payer: BC Managed Care – PPO | Admitting: Licensed Clinical Social Worker

## 2021-03-27 ENCOUNTER — Other Ambulatory Visit: Payer: Self-pay

## 2021-03-27 ENCOUNTER — Other Ambulatory Visit (HOSPITAL_COMMUNITY): Payer: BC Managed Care – PPO | Admitting: Occupational Therapy

## 2021-03-27 ENCOUNTER — Encounter (HOSPITAL_COMMUNITY): Payer: Self-pay

## 2021-03-27 DIAGNOSIS — R4589 Other symptoms and signs involving emotional state: Secondary | ICD-10-CM

## 2021-03-27 DIAGNOSIS — F329 Major depressive disorder, single episode, unspecified: Secondary | ICD-10-CM | POA: Diagnosis not present

## 2021-03-27 DIAGNOSIS — R41844 Frontal lobe and executive function deficit: Secondary | ICD-10-CM

## 2021-03-27 DIAGNOSIS — F4310 Post-traumatic stress disorder, unspecified: Secondary | ICD-10-CM

## 2021-03-27 DIAGNOSIS — F332 Major depressive disorder, recurrent severe without psychotic features: Secondary | ICD-10-CM

## 2021-03-27 NOTE — Therapy (Signed)
[facility] PARTIAL HOSPITALIZATION PROGRAM [address] [place], [place], [address] Phone: [phone]   Fax:  [phone] Virtual Visit via Video Note  I connected with [name] on 03/27/21 at  11:15 AM EDT by a video enabled telemedicine application and verified that I am speaking with the correct person using two identifiers.  Location: Patient: Patient Home Provider: Clinic Office   I discussed the limitations of evaluation and management by telemedicine and the availability of in person appointments. The patient expressed understanding and agreed to proceed.  I discussed the assessment and treatment plan with the patient. The patient was provided an opportunity to ask questions and all were answered. The patient agreed with the plan and demonstrated an understanding of the instructions.   The patient was advised to call back or seek an in-person evaluation if the symptoms worsen or if the condition fails to improve as anticipated.  I provided 60 minutes of non-face-to-face time during this encounter.   Donne Hazel, OT   Occupational Therapy Treatment  Patient Details  Name: Sharon Gibson MRN: [id number] Date of Birth: Sep 12, 1981 Referring Provider (OT): Hillery Jacks   Encounter Date: 03/27/2021   OT End of Session - 03/27/21 1315     Visit Number 5    Number of Visits 20    Date for OT Re-Evaluation 04/18/21    Authorization Type [employer]    OT Start Time 1115    OT Stop Time 1215    OT Time Calculation (min) 60 min    Activity Tolerance Patient tolerated treatment well    Behavior During Therapy WFL for tasks assessed/performed             Past Medical History:  Diagnosis Date   Anemia    Anxiety    Chicken pox    Depression    Eating disorder    Has had residential treatment and hospitalization previously.   Heart murmur    as a child   History of anemia    RECIEVES IRON INFUSIONS YEARLY   History of  pulmonary embolus (PE) 2002   bilaterally-followed by [facility] hematology-w/u negative per pt   Hypothyroidism    no meds currently   PTSD (post-traumatic stress disorder)     Past Surgical History:  Procedure Laterality Date   CHOLECYSTECTOMY  2003   EXCISION OF ENDOMETRIOMA     GASTRIC BYPASS  2003   LAPAROSCOPIC OVARIAN CYSTECTOMY Left 05/10/2018   Procedure: LAPAROSCOPIC OVARIAN CYSTECTOMY;  Surgeon: Natale Milch, MD;  Location: [facility] ORS;  Service: Gynecology;  Laterality: Left;    There were no vitals filed for this visit.   Subjective Assessment - 03/27/21 1314     Currently in Pain? No/denies              OT Education - 03/27/21 1314     Education Details Educated on sleep hygiene and strategies to improve overall sleep quality    Person(s) Educated Patient    Methods Explanation;Handout    Comprehension Verbalized understanding              OT Short Term Goals - 03/24/21 1344       OT SHORT TERM GOAL #1   Status On-going      OT SHORT TERM GOAL #2   Status On-going      OT SHORT TERM GOAL #3   Status On-going           Group Session:  S: "I  have nightmares that have been getting really intense lately and it affects my sleep quality."  O: Today's group discussion focused on topic of Sleep Hygiene. Patients reflected on the quality of sleep they typically receive and identified areas that need improvement. Group was given background information on sleep and sleep hygiene, including common sleep disorders. Group members also received information on how to improve one's sleep and introduced a sleep diary as a tool that can be utilized to track sleep quality over a length of time. Group session ended with patients identifying one or more strategies they could utilize or implement into their sleep routine in order to improve overall sleep quality.    A: Senetra was active and independent in her participation of discussion and activity, sharing that  she recently started having intense nightmares, which has impacted her sleep quality and ability to sleep through the night. She is not aware of the trigger for them, however reports trauma-focused therapy when she was in residential and believes these flashbacks and PTSD may be a factor. Appeared open and receptive to further sleep hygiene habits and shared some of her own strategies that she has found useful, including white noise and meditation.   P: Continue to attend PHP OT group sessions 5x week for 2 weeks to promote daily structure, social engagement, and opportunities to develop and utilize adaptive strategies to maximize functional performance in preparation for safe transition and integration back into school, work, and the community. Plan to address topic of wellness in next OT group session.  Plan - 03/27/21 1315     Occupational performance deficits (Please refer to evaluation for details): ADL's;IADL's;Rest and Sleep;Education;Work;Leisure;Social Participation    Body Structure / Function / Physical Skills ADL    Cognitive Skills Attention;Emotional;Energy/Drive;Learn;Memory;Perception;Problem Solve;Safety Awareness;Temperament/Personality;Thought;Understand    Psychosocial Skills Coping Strategies;Environmental  Adaptations;Habits;Interpersonal Interaction;Routines and Behaviors             Patient will benefit from skilled therapeutic intervention in order to improve the following deficits and impairments:   Body Structure / Function / Physical Skills: ADL Cognitive Skills: Attention, Emotional, Energy/Drive, Learn, Memory, Perception, Problem Solve, Safety Awareness, Temperament/Personality, Thought, Understand Psychosocial Skills: Coping Strategies, Environmental  Adaptations, Habits, Interpersonal Interaction, Routines and Behaviors   Visit Diagnosis: Difficulty coping  Frontal lobe and executive function deficit  Severe episode of recurrent major depressive disorder,  without psychotic features (HCC)    Problem List Patient Active Problem List   Diagnosis Date Noted   Cervical strain 11/28/2019   Endometriosis determined by laparoscopy 06/13/2018   Ovarian cyst 03/01/2018   Hx pulmonary embolism 02/03/2018   Rash 10/16/2016   Generalized anxiety disorder 10/12/2016   Right knee pain 09/02/2016   Severe recurrent major depression without psychotic features (HCC) 07/23/2016   Bulimia nervosa, in partial remission, mild 07/23/2016    Class: Chronic   Iron deficiency anemia 04/08/2016   B12 deficiency 04/08/2016   S/P gastric bypass 03/20/2016   History of eating disorder 03/20/2016   Insomnia 03/20/2016   PTSD (post-traumatic stress disorder) 03/20/2016   Hypothyroidism 03/20/2016   PCOS (polycystic ovarian syndrome) 12/14/2014    03/27/2021  Donne Hazel, MOT, OTR/L  03/27/2021, 1:16 PM  [facility] PARTIAL HOSPITALIZATION PROGRAM [address] [place], [place], [address] Phone: [phone]   Fax:  [phone]  Name: Gwenneth Whiteman MRN: [id number] Date of Birth: 03-Jan-1981

## 2021-03-28 ENCOUNTER — Other Ambulatory Visit: Payer: Self-pay

## 2021-03-28 ENCOUNTER — Other Ambulatory Visit (HOSPITAL_COMMUNITY): Payer: BC Managed Care – PPO | Attending: Psychiatry | Admitting: Licensed Clinical Social Worker

## 2021-03-28 ENCOUNTER — Other Ambulatory Visit (HOSPITAL_COMMUNITY): Payer: BC Managed Care – PPO

## 2021-03-28 DIAGNOSIS — F332 Major depressive disorder, recurrent severe without psychotic features: Secondary | ICD-10-CM | POA: Diagnosis not present

## 2021-03-28 DIAGNOSIS — F329 Major depressive disorder, single episode, unspecified: Secondary | ICD-10-CM | POA: Diagnosis present

## 2021-03-28 DIAGNOSIS — R4589 Other symptoms and signs involving emotional state: Secondary | ICD-10-CM

## 2021-03-28 DIAGNOSIS — F411 Generalized anxiety disorder: Secondary | ICD-10-CM | POA: Diagnosis not present

## 2021-03-28 DIAGNOSIS — F4310 Post-traumatic stress disorder, unspecified: Secondary | ICD-10-CM | POA: Diagnosis not present

## 2021-03-28 NOTE — Progress Notes (Signed)
Virtual Visit via Video Note  I connected with Sharon Gibson on 03/30/21 at  9:00 AM EDT by a video enabled telemedicine application and verified that I am speaking with the correct person using two identifiers.  Location: Patient: Home Provider: Office   I discussed the limitations of evaluation and management by telemedicine and the availability of in person appointments. The patient expressed understanding and agreed to proceed.   I discussed the assessment and treatment plan with the patient. The patient was provided an opportunity to ask questions and all were answered. The patient agreed with the plan and demonstrated an understanding of the instructions.   The patient was advised to call back or seek an in-person evaluation if the symptoms worsen or if the condition fails to improve as anticipated.  I provided 15 minutes of non-face-to-face time during this encounter.   Sharon Center, NP   [facility] MD/PA/NP OP Progress Note  03/30/2021 4:30 PM Sharon Gibson  MRN:  [id number]  Chief Complaint:  Sharon Gibson reported " overall I am doing and feeling better."  HPI: Sharon Gibson was seen and evaluated via WebEx.  She presents with a bright pleasant affect.  Denying suicidal or homicidal ideations.  Denies auditory or visual hallucinations.  Does report passive ideations denied plan or intent.  Was reported patient continues to endorse reoccurring nightmares.  Discussed initiating Minipress however patient declined at this time.  She states " I do not want to take any medication because I will be unable to process my trauma.  I was told that medication may suppresses trauma memories."   Reports mild anxiety attacks.  Discussed increasing hydroxyzine from 61m to 566mPRN patient was receptive to plan.  Patient to continue partial hospitalization programming.  Support ,encouragement  and reassurance was provided.  Visit Diagnosis:    ICD-10-CM   1. Difficulty coping  R45.89     2.  Severe episode of recurrent major depressive disorder, without psychotic features (HC[facility] F33.2       Past Psychiatric History:   Past Medical History:  Past Medical History:  Diagnosis Date   Anemia    Anxiety    Chicken pox    Depression    Eating disorder    Has had residential treatment and hospitalization previously.   Heart murmur    as a child   History of anemia    RECIEVES IRON INFUSIONS YEARLY   History of pulmonary embolus (PE) 2002   [facility]   Hypothyroidism    no meds currently   PTSD (post-traumatic stress disorder)     Past Surgical History:  Procedure Laterality Date   CHOLECYSTECTOMY  2003   EXCISION OF ENDOMETRIOMA     GASTRIC BYPASS  2003   LAPAROSCOPIC OVARIAN CYSTECTOMY Left 05/10/2018   Procedure: LAPAROSCOPIC OVARIAN CYSTECTOMY;  Surgeon: ScHomero FellersMD;  Location: [facility] ORS;  Service: Gynecology;  Laterality: Left;    Family Psychiatric History:   Family History:  Family History  Problem Relation Age of Onset   Depression Father    Diabetes Father    Anxiety disorder Father    Alzheimer's disease Maternal Grandfather    Alzheimer's disease Maternal Grandmother    Prostate cancer Paternal Grandfather    Lung cancer Paternal Grandmother     Social History:  Social History   Socioeconomic History   Marital status: Single    Spouse name: Not on file   Number of children: 0   Years  of education: Not on file   Highest education level: Master's degree (e.g., MA, MS, MEng, MEd, MSW, MBA)  Occupational History   Not on file  Tobacco Use   Smoking status: Never   Smokeless tobacco: Never  Vaping Use   Vaping Use: Never used  Substance and Sexual Activity   Alcohol use: Yes    Alcohol/week: 0.0 - 1.0 standard drinks    Comment: rare   Drug use: No   Sexual activity: Yes    Birth control/protection: I.U.D.    Comment: Mirena   Other Topics Concern   Not on file  Social  History Narrative   Not on file   Social Determinants of Health   Financial Resource Strain: Not on file  Food Insecurity: Not on file  Transportation Needs: Not on file  Physical Activity: Not on file  Stress: Not on file  Social Connections: Not on file    Allergies:  Allergies  Allergen Reactions   Penicillins Hives    Has patient had a PCN reaction causing immediate rash, facial/tongue/throat swelling, SOB or lightheadedness with hypotension: yes Has patient had a PCN reaction causing severe rash involving mucus membranes or skin necrosis: no Has patient had a PCN reaction that required hospitalization: yes Has patient had a PCN reaction occurring within the last 10 years: no If all of the above answers are "NO", then may proceed with Cephalosporin use.    Azithromycin    Other     Narcotics- Burning in the stomach (whether PO or IV)   Strawberry (Diagnostic) Swelling    AND WALNUTS-TONGUE SWELLING    Metabolic Disorder Labs: Lab Results  Component Value Date   HGBA1C 5.0 11/02/2019   No results found for: PROLACTIN Lab Results  Component Value Date   CHOL 173 08/16/2020   TRIG 60 08/16/2020   HDL 66 08/16/2020   CHOLHDL 3 03/20/2016   VLDL 12.2 03/20/2016   LDLCALC 95 08/16/2020   LDLCALC 89 02/01/2020   Lab Results  Component Value Date   TSH 3.997 11/14/2020   TSH 8.770 (H) 08/16/2020    Therapeutic Level Labs: No results found for: LITHIUM No results found for: VALPROATE No components found for:  CBMZ  Current Medications: Current Outpatient Medications  Medication Sig Dispense Refill   Azelaic Acid (FINACEA) 15 % FOAM Apply to face 1-2 times a day for rosacea. (Patient not taking: No sig reported) 50 g 5   ciprofloxacin (CIPRO) 500 MG tablet Take 1 tablet (500 mg total) by mouth 2 (two) times daily. (Patient not taking: Reported on 03/25/2021) 14 tablet 0   clobetasol cream (TEMOVATE) 0.05 % Apply to affected areas rash 1-2 times a day as  directed. Avoid face, groin, underarms. (Patient not taking: No sig reported) 60 g 0   clonazePAM (KLONOPIN) 0.5 MG tablet Take 1 tablet (0.5 mg total) by mouth 2 (two) times daily as needed for anxiety. 60 tablet 1   Cyanocobalamin (B-12 COMPLIANCE INJECTION) 1000 MCG/ML KIT Inject 1,000 mcg as directed every 30 (thirty) days. (Patient not taking: Reported on 03/25/2021) 1 kit 3   EPINEPHrine 0.3 mg/0.3 mL IJ SOAJ injection SMARTSIG:0.3 Milliliter(s) IM Once PRN     gabapentin (NEURONTIN) 100 MG capsule Take 1 capsule (100 mg total) by mouth at bedtime. 90 capsule 1   hydrOXYzine (ATARAX/VISTARIL) 25 MG tablet Take 25 mg by mouth 3 (three) times daily.     levocetirizine (XYZAL) 5 MG tablet Take 1 tablet (5 mg total) by mouth  every evening. (Patient not taking: No sig reported) 30 tablet 3   levonorgestrel (MIRENA) 20 MCG/24HR IUD 1 each by Intrauterine route once.     levothyroxine (SYNTHROID) 100 MCG tablet Take 1 tablet (100 mcg total) by mouth daily. (Patient taking differently: Take 125 mcg by mouth daily.) 30 tablet 1   Multiple Vitamin (MULTIVITAMIN) tablet Take 1 tablet by mouth daily. (Patient not taking: Reported on 03/25/2021)     sertraline (ZOLOFT) 100 MG tablet Take 2 tablets (200 mg total) by mouth at bedtime. (Patient taking differently: Take 250 mg by mouth at bedtime.) 180 tablet 1   No current facility-administered medications for this visit.     Musculoskeletal: Strength & Muscle Tone: within normal limits Gait & Station: normal Patient leans: N/A  Psychiatric Specialty Exam: Review of Systems  There were no vitals taken for this visit.There is no height or weight on file to calculate BMI.  General Appearance: Negative  Eye Contact:  Good  Speech:  Clear and Coherent  Volume:  Normal  Mood:  Anxious and Depressed  Affect:  Appropriate  Thought Process:  Coherent  Orientation:  Full (Time, Place, and Person)  Thought Content: Logical   Suicidal Thoughts:  No   Homicidal Thoughts:  No  Memory:  Immediate;   Fair Recent;   Fair  Judgement:  Fair  Insight:  Fair  Psychomotor Activity:  Normal  Concentration:  Concentration: Good  Recall:  Good  Fund of Knowledge: Good  Language: Good  Akathisia:  No  Handed:  Right  AIMS (if indicated): done  Assets:  Communication Skills Desire for Improvement Social Support  ADL's:  Intact  Cognition: WNL  Sleep:  Fair   Screenings: GAD-7    Flowsheet Row Office Visit from 11/11/2020 in [place] Office Visit from 08/16/2020 in [facility] Office Visit from 02/01/2020 in [place] Visit from 12/11/2019 in [place] Visit from 06/06/2019 in [place]  Total GAD-7 Score '15 16 20 21 5      ' PHQ2-9    Flowsheet Row Counselor from 03/25/2021 in [place] Office Visit from 11/11/2020 in [place] Visit from 08/16/2020 in [place] Visit from 02/01/2020 in [place] Visit from 12/11/2019 in [place]  PHQ-2 Total Score 6 0 '4 4 3  ' PHQ-9 Total Score 26 0 '17 23 15      ' Flowsheet Row Counselor from 03/25/2021 in [place] and Plan:  Continue partial hospitalization program Increase hydroxyzine 25 mg to 50 mg p.o. as needed 3 times daily  Treatment plan was reviewed and agreed upon by NPT Numa Schroeter and Jazariah Mcclarty's need for continued group services   Sharon Center, NP 03/30/2021, 4:30 PM

## 2021-03-30 ENCOUNTER — Encounter (HOSPITAL_COMMUNITY): Payer: Self-pay | Admitting: Family
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
## 2021-04-01 ENCOUNTER — Other Ambulatory Visit (HOSPITAL_COMMUNITY): Payer: BC Managed Care – PPO | Admitting: Occupational Therapy

## 2021-04-01 ENCOUNTER — Other Ambulatory Visit: Payer: Self-pay

## 2021-04-01 ENCOUNTER — Other Ambulatory Visit (HOSPITAL_COMMUNITY): Payer: BC Managed Care – PPO | Admitting: Licensed Clinical Social Worker

## 2021-04-01 ENCOUNTER — Encounter (HOSPITAL_COMMUNITY): Payer: Self-pay

## 2021-04-01 DIAGNOSIS — F332 Major depressive disorder, recurrent severe without psychotic features: Secondary | ICD-10-CM | POA: Diagnosis not present

## 2021-04-01 DIAGNOSIS — R41844 Frontal lobe and executive function deficit: Secondary | ICD-10-CM

## 2021-04-01 DIAGNOSIS — F329 Major depressive disorder, single episode, unspecified: Secondary | ICD-10-CM | POA: Diagnosis present

## 2021-04-01 DIAGNOSIS — F4310 Post-traumatic stress disorder, unspecified: Secondary | ICD-10-CM | POA: Diagnosis not present

## 2021-04-01 DIAGNOSIS — R4589 Other symptoms and signs involving emotional state: Secondary | ICD-10-CM

## 2021-04-01 DIAGNOSIS — F411 Generalized anxiety disorder: Secondary | ICD-10-CM | POA: Diagnosis not present

## 2021-04-01 NOTE — Therapy (Signed)
[facility] PARTIAL HOSPITALIZATION PROGRAM [address] [place], [place], [address] Phone: [phone]   Fax:  [phone] Virtual Visit via Video Note  I connected with Sharon Gibson on 04/01/21 at  11:00 AM EDT by a video enabled telemedicine application and verified that I am speaking with the correct person using two identifiers.  Location: Patient: Patient Home Provider: Clinic Office   I discussed the limitations of evaluation and management by telemedicine and the availability of in person appointments. The patient expressed understanding and agreed to proceed.     I discussed the assessment and treatment plan with the patient. The patient was provided an opportunity to ask questions and all were answered. The patient agreed with the plan and demonstrated an understanding of the instructions.   The patient was advised to call back or seek an in-person evaluation if the symptoms worsen or if the condition fails to improve as anticipated.  I provided 60 minutes of non-face-to-face time during this encounter.   Sharon Gibson, OT   Occupational Therapy Treatment  Patient Details  Name: Sharon Gibson MRN: [id number] Date of Birth: 07-11-1981 Referring Provider (OT): Hillery Jacks   Encounter Date: 04/01/2021   OT End of Session - 04/01/21 1514     Visit Number 6    Number of Visits 20    Date for OT Re-Evaluation 04/18/21    Authorization Type BC[employer]    OT Start Time 1140    OT Stop Time 1215    OT Time Calculation (min) 35 min    Activity Tolerance Patient tolerated treatment well    Behavior During Therapy WFL for tasks assessed/performed             Past Medical History:  Diagnosis Date   Anemia    Anxiety    Chicken pox    Depression    Eating disorder    Has had residential treatment and hospitalization previously.   Heart murmur    as a child   History of anemia    RECIEVES IRON INFUSIONS YEARLY   History of  pulmonary embolus (PE) 2002   bilaterally-followed by [facility] hematology-w/u negative per pt   Hypothyroidism    no meds currently   PTSD (post-traumatic stress disorder)     Past Surgical History:  Procedure Laterality Date   CHOLECYSTECTOMY  2003   EXCISION OF ENDOMETRIOMA     GASTRIC BYPASS  2003   LAPAROSCOPIC OVARIAN CYSTECTOMY Left 05/10/2018   Procedure: LAPAROSCOPIC OVARIAN CYSTECTOMY;  Surgeon: Natale Milch, MD;  Location: [facility] ORS;  Service: Gynecology;  Laterality: Left;    There were no vitals filed for this visit.   Subjective Assessment - 04/01/21 1513     Currently in Pain? No/denies              OT Education - 04/01/21 1514     Education Details Educated on the 5 F's and provided resources/strategies/tips to improve overall health and wellness    Person(s) Educated Patient    Methods Explanation;Handout    Comprehension Verbalized understanding              OT Short Term Goals - 03/24/21 1344       OT SHORT TERM GOAL #1   Status On-going      OT SHORT TERM GOAL #2   Status On-going      OT SHORT TERM GOAL #3   Status On-going  Group Session:  S: "I definitely struggle with isolation, so fellowship would be the area I need the most work on right now."  O: Today's group session focused on the topic of health and wellness as it relates to the impact on mental health. Discussion focused on identifying the 5 F's to wellness including Food, Fitness, Fresh air, Fellowship, and Friendship with self and soul. Group members identified areas of wellness that they would like to improve upon and were educated and offered various resources. Discussion also focused on how the food we eat impacts our mental health, along with the benefits of engaging in physical activity/exercise and getting outside for fresh air. Discussion wrapped up with group members identifying one area of wellness they could improve upon and identified a strategy to do  so.    A: Sharon Gibson was active and independent in her participation of discussion and activity, sharing that she struggles with all areas of wellness, though identified fellowship as most notable. She shared that she tends to isolate and stay at home, keep to herself, and this has played a role/impact on her overall wellness and mental health. She appeared receptive to support provided from peers, as well as looking at additional education provided.   P: Continue to attend PHP OT group sessions 5x week for 1 weeks to promote daily structure, social engagement, and opportunities to develop and utilize adaptive strategies to maximize functional performance in preparation for safe transition and integration back into school, work, and the community.   Plan - 04/01/21 1514     Occupational performance deficits (Please refer to evaluation for details): ADL's;IADL's;Rest and Sleep;Education;Work;Leisure;Social Participation    Body Structure / Function / Physical Skills ADL    Cognitive Skills Attention;Emotional;Energy/Drive;Learn;Memory;Perception;Problem Solve;Safety Awareness;Temperament/Personality;Thought;Understand    Psychosocial Skills Coping Strategies;Environmental  Adaptations;Habits;Interpersonal Interaction;Routines and Behaviors             Patient will benefit from skilled therapeutic intervention in order to improve the following deficits and impairments:   Body Structure / Function / Physical Skills: ADL Cognitive Skills: Attention, Emotional, Energy/Drive, Learn, Memory, Perception, Problem Solve, Safety Awareness, Temperament/Personality, Thought, Understand Psychosocial Skills: Coping Strategies, Environmental  Adaptations, Habits, Interpersonal Interaction, Routines and Behaviors   Visit Diagnosis: Difficulty coping  Severe episode of recurrent major depressive disorder, without psychotic features (HCC)  Frontal lobe and executive function deficit    Problem  List Patient Active Problem List   Diagnosis Date Noted   Cervical strain 11/28/2019   Endometriosis determined by laparoscopy 06/13/2018   Ovarian cyst 03/01/2018   Hx pulmonary embolism 02/03/2018   Rash 10/16/2016   Generalized anxiety disorder 10/12/2016   Right knee pain 09/02/2016   Severe recurrent major depression without psychotic features (HCC) 07/23/2016   Bulimia nervosa, in partial remission, mild 07/23/2016    Class: Chronic   Iron deficiency anemia 04/08/2016   B12 deficiency 04/08/2016   S/P gastric bypass 03/20/2016   History of eating disorder 03/20/2016   Insomnia 03/20/2016   PTSD (post-traumatic stress disorder) 03/20/2016   Hypothyroidism 03/20/2016   PCOS (polycystic ovarian syndrome) 12/14/2014    04/01/2021  Sharon Gibson, MOT, OTR/L  04/01/2021, 3:15 PM  [facility] PARTIAL HOSPITALIZATION PROGRAM [address] [place], [place], [address] Phone: [phone]   Fax:  [phone]  Name: Sharon Gibson MRN: [id number] Date of Birth: Oct 05, 1980

## 2021-04-02 ENCOUNTER — Other Ambulatory Visit (HOSPITAL_COMMUNITY): Payer: BC Managed Care – PPO | Admitting: Occupational Therapy

## 2021-04-02 ENCOUNTER — Other Ambulatory Visit (HOSPITAL_COMMUNITY): Payer: BC Managed Care – PPO | Admitting: Licensed Clinical Social Worker

## 2021-04-02 ENCOUNTER — Other Ambulatory Visit: Payer: Self-pay

## 2021-04-02 ENCOUNTER — Encounter (HOSPITAL_COMMUNITY): Payer: Self-pay | Admitting: Family

## 2021-04-02 DIAGNOSIS — R41844 Frontal lobe and executive function deficit: Secondary | ICD-10-CM

## 2021-04-02 DIAGNOSIS — F332 Major depressive disorder, recurrent severe without psychotic features: Secondary | ICD-10-CM

## 2021-04-02 DIAGNOSIS — F411 Generalized anxiety disorder: Secondary | ICD-10-CM

## 2021-04-02 DIAGNOSIS — F4310 Post-traumatic stress disorder, unspecified: Secondary | ICD-10-CM

## 2021-04-02 DIAGNOSIS — R4589 Other symptoms and signs involving emotional state: Secondary | ICD-10-CM

## 2021-04-02 MED ORDER — PRAZOSIN HCL 1 MG PO CAPS: 1.0000 mg | ORAL_CAPSULE | Freq: Every day | ORAL | 0 refills | Status: DC

## 2021-04-02 NOTE — Progress Notes (Signed)
[facility] MD/PA/NP OP Progress Note  04/02/2021 11:39 AM Sharon Gibson  MRN:  [id number]  Sharon Gibson was seen and evaluated via WebEx. Patient has a charted history of a posttraumatic stress disorder, major depressive disorder and generalized anxiety. She is requesting to be started on Minipress (Prazosin) for nightmares.  As she reports ongoing nightmares with worsening intensity. "  My trauma is overwhelming, and I want to try medication now."  Sharon Gibson is currently denying suicidal or homicidal ideations.  Denies auditory or visual hallucinations.  Does report intermittent passive suicidal ideations however denied plan or intent.  Currently rating her depression 7 out of 10 with 10 being the worst.  States overall group has been helpful and enjoys the support/structure.    Sharon Gibson  to continue partial hospitalization programming.  We will continue to monitor medications.  Support, encouragement  and reassurance was provided.  Visit Diagnosis:    ICD-10-CM   1. Severe episode of recurrent major depressive disorder, without psychotic features ([name])  F33.2     2. PTSD (post-traumatic stress disorder)  F43.10     3. GAD (generalized anxiety disorder)  F41.1       Past Psychiatric History:   Past Medical History:  Past Medical History:  Diagnosis Date   Anemia    Anxiety    Chicken pox    Depression    Eating disorder    Has had residential treatment and hospitalization previously.   Heart murmur    as a child   History of anemia    RECIEVES IRON INFUSIONS YEARLY   History of pulmonary embolus (PE) 2002   bilaterally-followed by [facility] hematology-w/u negative per pt   Hypothyroidism    no meds currently   PTSD (post-traumatic stress disorder)     Past Surgical History:  Procedure Laterality Date   CHOLECYSTECTOMY  2003   EXCISION OF ENDOMETRIOMA     GASTRIC BYPASS  2003   LAPAROSCOPIC OVARIAN CYSTECTOMY Left 05/10/2018   Procedure: LAPAROSCOPIC OVARIAN CYSTECTOMY;  Surgeon:  Homero Fellers, MD;  Location: [facility] ORS;  Service: Gynecology;  Laterality: Left;    Family Psychiatric History:   Family History:  Family History  Problem Relation Age of Onset   Depression Father    Diabetes Father    Anxiety disorder Father    Alzheimer's disease Maternal Grandfather    Alzheimer's disease Maternal Grandmother    Prostate cancer Paternal Grandfather    Lung cancer Paternal Grandmother     Social History:  Social History   Socioeconomic History   Marital status: Single    Spouse name: Not on file   Number of children: 0   Years of education: Not on file   Highest education level: Master's degree (e.g., MA, MS, MEng, MEd, MSW, MBA)  Occupational History   Not on file  Tobacco Use   Smoking status: Never   Smokeless tobacco: Never  Vaping Use   Vaping Use: Never used  Substance and Sexual Activity   Alcohol use: Yes    Alcohol/week: 0.0 - 1.0 standard drinks    Comment: rare   Drug use: No   Sexual activity: Yes    Birth control/protection: I.U.D.    Comment: Mirena   Other Topics Concern   Not on file  Social History Narrative   Not on file   Social Determinants of Health   Financial Resource Strain: Not on file  Food Insecurity: Not on file  Transportation Needs: Not on file  Physical Activity: Not on  file  Stress: Not on file  Social Connections: Not on file    Allergies:  Allergies  Allergen Reactions   Penicillins Hives    Has patient had a PCN reaction causing immediate rash, facial/tongue/throat swelling, SOB or lightheadedness with hypotension: yes Has patient had a PCN reaction causing severe rash involving mucus membranes or skin necrosis: no Has patient had a PCN reaction that required hospitalization: yes Has patient had a PCN reaction occurring within the last 10 years: no If all of the above answers are "NO", then may proceed with Cephalosporin use.    Azithromycin    Other     Narcotics- Burning in the stomach  (whether PO or IV)   Strawberry (Diagnostic) Swelling    AND WALNUTS-TONGUE SWELLING    Metabolic Disorder Labs: Lab Results  Component Value Date   HGBA1C 5.0 11/02/2019   No results found for: PROLACTIN Lab Results  Component Value Date   CHOL 173 08/16/2020   TRIG 60 08/16/2020   HDL 66 08/16/2020   CHOLHDL 3 03/20/2016   VLDL 12.2 03/20/2016   LDLCALC 95 08/16/2020   LDLCALC 89 02/01/2020   Lab Results  Component Value Date   TSH 3.997 11/14/2020   TSH 8.770 (H) 08/16/2020    Therapeutic Level Labs: No results found for: LITHIUM No results found for: VALPROATE No components found for:  CBMZ  Current Medications: Current Outpatient Medications  Medication Sig Dispense Refill   prazosin (MINIPRESS) 1 MG capsule Take 1 capsule (1 mg total) by mouth at bedtime. 30 capsule 0   Azelaic Acid (FINACEA) 15 % FOAM Apply to face 1-2 times a day for rosacea. (Patient not taking: No sig reported) 50 g 5   ciprofloxacin (CIPRO) 500 MG tablet Take 1 tablet (500 mg total) by mouth 2 (two) times daily. (Patient not taking: Reported on 03/25/2021) 14 tablet 0   clobetasol cream (TEMOVATE) 0.05 % Apply to affected areas rash 1-2 times a day as directed. Avoid face, groin, underarms. (Patient not taking: No sig reported) 60 g 0   clonazePAM (KLONOPIN) 0.5 MG tablet Take 1 tablet (0.5 mg total) by mouth 2 (two) times daily as needed for anxiety. 60 tablet 1   Cyanocobalamin (B-12 COMPLIANCE INJECTION) 1000 MCG/ML KIT Inject 1,000 mcg as directed every 30 (thirty) days. (Patient not taking: Reported on 03/25/2021) 1 kit 3   EPINEPHrine 0.3 mg/0.3 mL IJ SOAJ injection SMARTSIG:0.3 Milliliter(s) IM Once PRN     gabapentin (NEURONTIN) 100 MG capsule Take 1 capsule (100 mg total) by mouth at bedtime. 90 capsule 1   hydrOXYzine (ATARAX/VISTARIL) 25 MG tablet Take 25 mg by mouth 3 (three) times daily.     levocetirizine (XYZAL) 5 MG tablet Take 1 tablet (5 mg total) by mouth every evening. (Patient  not taking: No sig reported) 30 tablet 3   levonorgestrel (MIRENA) 20 MCG/24HR IUD 1 each by Intrauterine route once.     levothyroxine (SYNTHROID) 100 MCG tablet Take 1 tablet (100 mcg total) by mouth daily. (Patient taking differently: Take 125 mcg by mouth daily.) 30 tablet 1   Multiple Vitamin (MULTIVITAMIN) tablet Take 1 tablet by mouth daily. (Patient not taking: Reported on 03/25/2021)     sertraline (ZOLOFT) 100 MG tablet Take 2 tablets (200 mg total) by mouth at bedtime. (Patient taking differently: Take 250 mg by mouth at bedtime.) 180 tablet 1   No current facility-administered medications for this visit.     Musculoskeletal: Strength & Muscle Tone: within normal  limits Gait & Station: normal Patient leans: N/A  Psychiatric Specialty Exam: Review of Systems  HENT: Negative.    Respiratory: Negative.    Gastrointestinal: Negative.   Musculoskeletal: Negative.   Psychiatric/Behavioral:  Positive for decreased concentration. Suicidal ideas: passive ideatiobns.  All other systems reviewed and are negative.  There were no vitals taken for this visit.There is no height or weight on file to calculate BMI.  General Appearance: Casual  Eye Contact:  Good  Speech:  Clear and Coherent  Volume:  Normal  Mood:  Anxious and Depressed  Affect:  Congruent  Thought Process:  Coherent  Orientation:  Full (Time, Place, and Person)  Thought Content: Logical   Suicidal Thoughts:  No  Homicidal Thoughts:  No  Memory:  Immediate;   Good Recent;   Good  Judgement:  Good  Insight:  Fair  Psychomotor Activity:  Normal  Concentration:  Concentration: Good  Recall:  Good  Fund of Knowledge: Good  Language: Fair  Akathisia:  No  Handed:  Right  AIMS (if indicated): done  Assets:  Communication Skills Resilience Social Support  ADL's:  Intact  Cognition: WNL  Sleep:  Poor   Screenings: GAD-7    Flowsheet Row Office Visit from 11/11/2020 in [place] Visit  from 08/16/2020 in [place] Visit from 02/01/2020 in [place] Visit from 12/11/2019 in [place] Visit from 06/06/2019 in [place]  Total GAD-7 Score _0 PHQ2-9    Flowsheet Row Counselor from 03/25/2021 in [place] Visit from 11/11/2020 in [place] Visit from 08/16/2020 in [place] Visit from 02/01/2020 in [place] Visit from 12/11/2019 in [place]  PHQ-2 Total Score 6 0 _1 PHQ-9 Total Score 26 0 _2 Flowsheet Row Counselor from 03/25/2021 in [place] and Plan:  Continue Partial hospitalization Programming (PHP)   Initiated prazosin (Minipress) 1 mg p.o. nightly  Treatment plan was reviewed and agreed upon by NP Myrle Sheng and patient Sharon Gibson need for continued group services    [place], NP 04/02/2021, 11:39 AM

## 2021-04-03 ENCOUNTER — Other Ambulatory Visit (HOSPITAL_COMMUNITY): Payer: BC Managed Care – PPO | Admitting: Occupational Therapy

## 2021-04-03 ENCOUNTER — Encounter (HOSPITAL_COMMUNITY): Payer: Self-pay

## 2021-04-03 ENCOUNTER — Ambulatory Visit (INDEPENDENT_AMBULATORY_CARE_PROVIDER_SITE_OTHER): Payer: BC Managed Care – PPO | Admitting: Family Medicine

## 2021-04-03 ENCOUNTER — Other Ambulatory Visit: Payer: Self-pay

## 2021-04-03 ENCOUNTER — Encounter: Payer: Self-pay | Admitting: Family Medicine

## 2021-04-03 ENCOUNTER — Other Ambulatory Visit (HOSPITAL_COMMUNITY): Payer: BC Managed Care – PPO | Admitting: Licensed Clinical Social Worker

## 2021-04-03 VITALS — BP 101/66 | HR 74 | Temp 98.4°F | Ht 63.0 in

## 2021-04-03 DIAGNOSIS — D509 Iron deficiency anemia, unspecified: Secondary | ICD-10-CM

## 2021-04-03 DIAGNOSIS — E538 Deficiency of other specified B group vitamins: Secondary | ICD-10-CM

## 2021-04-03 DIAGNOSIS — F411 Generalized anxiety disorder: Secondary | ICD-10-CM

## 2021-04-03 DIAGNOSIS — R41844 Frontal lobe and executive function deficit: Secondary | ICD-10-CM

## 2021-04-03 DIAGNOSIS — L740 Miliaria rubra: Secondary | ICD-10-CM

## 2021-04-03 DIAGNOSIS — Z1231 Encounter for screening mammogram for malignant neoplasm of breast: Secondary | ICD-10-CM

## 2021-04-03 DIAGNOSIS — Z9884 Bariatric surgery status: Secondary | ICD-10-CM | POA: Diagnosis not present

## 2021-04-03 DIAGNOSIS — Z0000 Encounter for general adult medical examination without abnormal findings: Secondary | ICD-10-CM

## 2021-04-03 DIAGNOSIS — R8281 Pyuria: Secondary | ICD-10-CM

## 2021-04-03 DIAGNOSIS — E039 Hypothyroidism, unspecified: Secondary | ICD-10-CM

## 2021-04-03 DIAGNOSIS — R4589 Other symptoms and signs involving emotional state: Secondary | ICD-10-CM

## 2021-04-03 DIAGNOSIS — Z8659 Personal history of other mental and behavioral disorders: Secondary | ICD-10-CM

## 2021-04-03 DIAGNOSIS — F332 Major depressive disorder, recurrent severe without psychotic features: Secondary | ICD-10-CM

## 2021-04-03 DIAGNOSIS — F4310 Post-traumatic stress disorder, unspecified: Secondary | ICD-10-CM

## 2021-04-03 LAB — URINALYSIS, ROUTINE W REFLEX MICROSCOPIC
Bilirubin, UA: NEGATIVE
Glucose, UA: NEGATIVE
Ketones, UA: NEGATIVE
Nitrite, UA: NEGATIVE
Protein,UA: NEGATIVE
RBC, UA: NEGATIVE
Specific Gravity, UA: 1.025 (ref 1.005–1.030)
Urobilinogen, Ur: 0.2 mg/dL (ref 0.2–1.0)
pH, UA: 5.5 (ref 5.0–7.5)

## 2021-04-03 LAB — MICROSCOPIC EXAMINATION: RBC, Urine: NONE SEEN /hpf (ref 0–2)

## 2021-04-03 MED ORDER — NYSTATIN-TRIAMCINOLONE 100000-0.1 UNIT/GM-% EX OINT: 1.0000 "application " | TOPICAL_OINTMENT | Freq: Two times a day (BID) | CUTANEOUS | 0 refills | Status: DC

## 2021-04-03 NOTE — Therapy (Signed)
[facility] PARTIAL HOSPITALIZATION PROGRAM [address] [place], [place], [address] Phone: [phone]   Fax:  [phone] Virtual Visit via Video Note  I connected with Sharon Gibson on 04/03/21 at  11:00 AM EDT by a video enabled telemedicine application and verified that I am speaking with the correct person using two identifiers.  Location: Patient: Patient Home Provider: Clinic Office   I discussed the limitations of evaluation and management by telemedicine and the availability of in person appointments. The patient expressed understanding and agreed to proceed.   I discussed the assessment and treatment plan with the patient. The patient was provided an opportunity to ask questions and all were answered. The patient agreed with the plan and demonstrated an understanding of the instructions.   The patient was advised to call back or seek an in-person evaluation if the symptoms worsen or if the condition fails to improve as anticipated.  I provided 60 minutes of non-face-to-face time during this encounter.   Sharon Gibson, OT   Occupational Therapy Treatment  Patient Details  Name: Sharon Gibson MRN: [id number] Date of Birth: 1980/12/12 Referring Provider (OT): Hillery Jacks   Encounter Date: 04/03/2021   OT End of Session - 04/03/21 1305     Visit Number 8    Number of Visits 20    Date for OT Re-Evaluation 04/18/21    Authorization Type BCBS    OT Start Time 1115    OT Stop Time 1215    OT Time Calculation (min) 60 min    Activity Tolerance Patient tolerated treatment well    Behavior During Therapy WFL for tasks assessed/performed             Past Medical History:  Diagnosis Date   Anemia    Anxiety    Chicken pox    Depression    Eating disorder    Has had residential treatment and hospitalization previously.   Heart murmur    as a child   History of anemia    RECIEVES IRON INFUSIONS YEARLY   History of  pulmonary embolus (PE) 2002   bilaterally-followed by [facility] hematology-w/u negative per pt   Hypothyroidism    no meds currently   PTSD (post-traumatic stress disorder)     Past Surgical History:  Procedure Laterality Date   CHOLECYSTECTOMY  2003   EXCISION OF ENDOMETRIOMA     GASTRIC BYPASS  2003   LAPAROSCOPIC OVARIAN CYSTECTOMY Left 05/10/2018   Procedure: LAPAROSCOPIC OVARIAN CYSTECTOMY;  Surgeon: Natale Milch, MD;  Location: [facility] ORS;  Service: Gynecology;  Laterality: Left;    There were no vitals filed for this visit.   Subjective Assessment - 04/03/21 1305     Currently in Pain? No/denies                   OT Education - 04/03/21 1305     Education Details Educated on identifying worry and utilized circle on control tool to categorize what we do and do not have control over    Person(s) Educated Patient    Methods Explanation;Handout    Comprehension Verbalized understanding              OT Short Term Goals - 03/24/21 1344       OT SHORT TERM GOAL #1   Status On-going      OT SHORT TERM GOAL #2   Status On-going      OT SHORT TERM GOAL #3   Status  On-going           Group Session:  S: "I am definitely a panick-er and worry about my safety and the intentions of other people."  O: Group session encouraged increased participation and engagement through discussion focused on worry and our circle of control. Group reviewed a powerpoint that discussed healthy vs unhealthy worry with specific examples provided. Discussion also focused on utilizing the circle of control outline to identify what is within our control, what we have influence on, and what is not in our control. Group members shared specific examples and worries and identified what categories they fell in within the circle of control.   A: Sharon Gibson was active and independent in her participation of discussion and activity, sharing that she worries all of the time and is a  'panicker', worrying about her safety and often the intentions of the people around her and is she safe around them. She shared that her worry can spiral into panic attacks and unhealthy boundaries, recognizing the need and importance of managing it with possible use of the circle of control tool. Pt actively practiced using the tool and identified a small worry of hers "driving to the doctor appointment tomorrow."  P: Continue to attend PHP OT group sessions 5x week for 2 weeks to promote daily structure, social engagement, and opportunities to develop and utilize adaptive strategies to maximize functional performance in preparation for safe transition and integration back into school, work, and the community.  Plan - 04/03/21 1305     Occupational performance deficits (Please refer to evaluation for details): ADL's;IADL's;Rest and Sleep;Education;Work;Leisure;Social Participation    Body Structure / Function / Physical Skills ADL    Cognitive Skills Attention;Emotional;Energy/Drive;Learn;Memory;Perception;Problem Solve;Safety Awareness;Temperament/Personality;Thought;Understand    Psychosocial Skills Coping Strategies;Environmental  Adaptations;Habits;Interpersonal Interaction;Routines and Behaviors             Patient will benefit from skilled therapeutic intervention in order to improve the following deficits and impairments:   Body Structure / Function / Physical Skills: ADL Cognitive Skills: Attention, Emotional, Energy/Drive, Learn, Memory, Perception, Problem Solve, Safety Awareness, Temperament/Personality, Thought, Understand Psychosocial Skills: Coping Strategies, Environmental  Adaptations, Habits, Interpersonal Interaction, Routines and Behaviors   Visit Diagnosis: Difficulty coping  Frontal lobe and executive function deficit  Severe episode of recurrent major depressive disorder, without psychotic features (HCC)    Problem List Patient Active Problem List   Diagnosis  Date Noted   Cervical strain 11/28/2019   Endometriosis determined by laparoscopy 06/13/2018   Ovarian cyst 03/01/2018   Hx pulmonary embolism 02/03/2018   Rash 10/16/2016   Generalized anxiety disorder 10/12/2016   Right knee pain 09/02/2016   Severe recurrent major depression without psychotic features (HCC) 07/23/2016   Bulimia nervosa, in partial remission, mild 07/23/2016    Class: Chronic   Iron deficiency anemia 04/08/2016   B12 deficiency 04/08/2016   S/P gastric bypass 03/20/2016   History of eating disorder 03/20/2016   Insomnia 03/20/2016   PTSD (post-traumatic stress disorder) 03/20/2016   Hypothyroidism 03/20/2016   PCOS (polycystic ovarian syndrome) 12/14/2014    04/03/2021  Sharon Gibson, MOT, OTR/L  04/03/2021, 1:06 PM  [facility] PARTIAL HOSPITALIZATION PROGRAM [address] [place], [place], [address] Phone: [phone]   Fax:  [phone]  Name: Sharon Gibson MRN: [id number] Date of Birth: 1981-03-03

## 2021-04-03 NOTE — Progress Notes (Signed)
Spiritual care group    11:00-12:00  Group met via web-ex due to COVID-19 precautions.  Group facilitated by Chaplain Valia Wingard, MDiv, BCC   Group focused on topic of "self-care"  Patients engaged in facilitated discussion about topic.  Explored quotes related to self care and chose one which they agreed with and one which they disliked.  Engaged in discussion around quote choices and their experience / understanding of care for themselves.   

## 2021-04-03 NOTE — Progress Notes (Signed)
Spoke with patient via Webex video call, used 2 identifiers to correctly identify patient. States that groups are going good and are helpful to her. She continues to have poor sleep and nightmares. She will start Minipress tonight but had questions about it being used for hypertension. Went over side effects to watch for, she will monitor and let staff know if she has any side effects. She is hopeful that the nightmares will decrease so her sleep improves. She does not feel safe and has a lot of anxiety around those feelings. Has chest pain when her anxiety is really bad. On scale 1-10 as 10 being worst she rates depression at 0 but feels very hopeless. She rates hopelessness at 8 and anxiety at 10. Has daily thoughts about dying but does not feel suicidal nor does she have a plan. Wishes to be dead. Contracts for safety. Denies HI or AV hallucinations. No issues or complaints.

## 2021-04-03 NOTE — Therapy (Signed)
[facility] PARTIAL HOSPITALIZATION PROGRAM [address] [place], [place], [address] Phone: [phone]   Fax:  [phone] Virtual Visit via Video Note  I connected with Enrigue Gibson on 04/02/21 at  12:00 PM EDT by a video enabled telemedicine application and verified that I am speaking with the correct person using two identifiers.  Location: Patient: Patient Home Provider: Clinic Office    I discussed the limitations of evaluation and management by telemedicine and the availability of in person appointments. The patient expressed understanding and agreed to proceed.   I discussed the assessment and treatment plan with the patient. The patient was provided an opportunity to ask questions and all were answered. The patient agreed with the plan and demonstrated an understanding of the instructions.   The patient was advised to call back or seek an in-person evaluation if the symptoms worsen or if the condition fails to improve as anticipated.  I provided 40 minutes of non-face-to-face time during this encounter.   Donne Hazel, OT   Occupational Therapy Treatment  Patient Details  Name: Sharon Gibson MRN: [id number] Date of Birth: 01-08-1981 Referring Provider (OT): Hillery Jacks   Encounter Date: 04/02/2021   OT End of Session - 04/03/21 0817     Visit Number 7    Number of Visits 20    Date for OT Re-Evaluation 04/18/21    Authorization Type BCBS    OT Start Time 1210    OT Stop Time 1250    OT Time Calculation (min) 40 min    Activity Tolerance Patient tolerated treatment well    Behavior During Therapy WFL for tasks assessed/performed             Past Medical History:  Diagnosis Date   Anemia    Anxiety    Chicken pox    Depression    Eating disorder    Has had residential treatment and hospitalization previously.   Heart murmur    as a child   History of anemia    RECIEVES IRON INFUSIONS YEARLY   History of  pulmonary embolus (PE) 2002   bilaterally-followed by [facility] hematology-w/u negative per pt   Hypothyroidism    no meds currently   PTSD (post-traumatic stress disorder)     Past Surgical History:  Procedure Laterality Date   CHOLECYSTECTOMY  2003   EXCISION OF ENDOMETRIOMA     GASTRIC BYPASS  2003   LAPAROSCOPIC OVARIAN CYSTECTOMY Left 05/10/2018   Procedure: LAPAROSCOPIC OVARIAN CYSTECTOMY;  Surgeon: Natale Milch, MD;  Location: [facility] ORS;  Service: Gynecology;  Laterality: Left;    There were no vitals filed for this visit.   Subjective Assessment - 04/03/21 0817     Currently in Pain? No/denies                                  OT Education - 04/03/21 0817     Education Details Educated on how to navigate aggressive communication and reviewed strategies/tips to "fight fair"    Person(s) Educated Patient    Methods Explanation;Handout    Comprehension Verbalized understanding              OT Short Term Goals - 03/24/21 1344       OT SHORT TERM GOAL #1   Status On-going      OT SHORT TERM GOAL #2   Status On-going  OT SHORT TERM GOAL #3   Status On-going           Group Session:  S: "I think I was pretty assertive the other day with the guy from work and it turned out well."  O: Group began with a reflection from previous OT session on assertiveness skills and training. Today's group was an expansion on assertive training and communication with a focus on Fair Fighting. Group members were provided with a set of 'rules' and tips on how to fight fairly, in order to come off as more assertive and less aggressive. Members identified one rule or tip they felt they struggled most with and were encouraged to focus on improving their communication within identified rule.    A: Yaniyah was active and independent in her participation of discussion, sharing that although she does not directly relate to the need for fair fighting  skills, could see how it would be important or helpful information to have in the future. She also shared that she has started to practice her assertive skills and was able to do so effectively in a conversation with a female friend from work. Pt also able to offer support and suggestions to other peers in the group.   P: Continue to attend PHP OT group sessions 5x week for 2 weeks to promote daily structure, social engagement, and opportunities to develop and utilize adaptive strategies to maximize functional performance in preparation for safe transition and integration back into school, work, and the community. Plan to address topic of stress management in next OT group session.  Plan - 04/03/21 0818     Occupational performance deficits (Please refer to evaluation for details): ADL's;IADL's;Rest and Sleep;Education;Work;Leisure;Social Participation    Body Structure / Function / Physical Skills ADL    Cognitive Skills Attention;Emotional;Energy/Drive;Learn;Memory;Perception;Problem Solve;Safety Awareness;Temperament/Personality;Thought;Understand    Psychosocial Skills Coping Strategies;Environmental  Adaptations;Habits;Interpersonal Interaction;Routines and Behaviors             Patient will benefit from skilled therapeutic intervention in order to improve the following deficits and impairments:   Body Structure / Function / Physical Skills: ADL Cognitive Skills: Attention, Emotional, Energy/Drive, Learn, Memory, Perception, Problem Solve, Safety Awareness, Temperament/Personality, Thought, Understand Psychosocial Skills: Coping Strategies, Environmental  Adaptations, Habits, Interpersonal Interaction, Routines and Behaviors   Visit Diagnosis: Difficulty coping  Frontal lobe and executive function deficit  Severe episode of recurrent major depressive disorder, without psychotic features (HCC)    Problem List Patient Active Problem List   Diagnosis Date Noted   Cervical strain  11/28/2019   Endometriosis determined by laparoscopy 06/13/2018   Ovarian cyst 03/01/2018   Hx pulmonary embolism 02/03/2018   Rash 10/16/2016   Generalized anxiety disorder 10/12/2016   Right knee pain 09/02/2016   Severe recurrent major depression without psychotic features (HCC) 07/23/2016   Bulimia nervosa, in partial remission, mild 07/23/2016    Class: Chronic   Iron deficiency anemia 04/08/2016   B12 deficiency 04/08/2016   S/P gastric bypass 03/20/2016   History of eating disorder 03/20/2016   Insomnia 03/20/2016   PTSD (post-traumatic stress disorder) 03/20/2016   Hypothyroidism 03/20/2016   PCOS (polycystic ovarian syndrome) 12/14/2014    04/03/2021  Donne Hazel, MOT, OTR/L  04/03/2021, 8:18 AM  [facility] PARTIAL HOSPITALIZATION PROGRAM [address] [place], [place], [address] Phone: [phone]   Fax:  [phone]  Name: Kynzlee Hucker MRN: [id number] Date of Birth: 1981-08-26

## 2021-04-03 NOTE — Progress Notes (Signed)
BP 101/66   Pulse 74   Temp 98.4 F (36.9 C)   Ht '5\' 3"'  (1.6 m)   SpO2 97%   BMI 51.18 kg/m    Subjective:    Patient ID: Sharon Gibson, female    DOB: [birth year], 40 y.o.   MRN: [id number]  HPI: Sharon Gibson is a 40 y.o. female presenting on 04/03/2021 for comprehensive medical examination. Current medical complaints include:  Was in residential treatment for about the past 6 weeks. Was taken off her wellbutrin. [place] and has been off her klonopin. Has just been on zoloft and hydroxyzine. Has been doing some trauma work. Had to go home early because of a COVID outbreak. Doing a partial program right now. Driving has been a problem for her as well. Currently on STD and FMLA. Has been seeing a provider and an a Marine scientist. She just got started on prazosin with them. She is looking to get into [facility] to continue her trauma therapy  DEPRESSION Mood status: uncontrolled Satisfied with current treatment?: working on it Symptom severity: severe  Duration of current treatment :  few weeks Side effects: no Medication compliance: excellent compliance Psychotherapy/counseling: yes current Previous psychiatric medications: yes Depressed mood: yes Anxious mood: yes Anhedonia: yes Insomnia: yes  Fatigue: yes Feelings of worthlessness or guilt: yes Impaired concentration/indecisiveness: yes Suicidal ideations: yes Hopelessness: yes Crying spells: yes Depression screen [facility] 2/9 04/03/2021 11/11/2020 08/16/2020 02/01/2020 12/11/2019  Decreased Interest 3 0 '2 2 2  ' Down, Depressed, Hopeless 3 0 '2 2 1  ' PHQ - 2 Score 6 0 '4 4 3  ' Altered sleeping 3 0 '3 3 2  ' Tired, decreased energy 3 0 '3 3 3  ' Change in appetite 3 0 '3 3 3  ' Feeling bad or failure about yourself  3 0 '2 3 2  ' Trouble concentrating 3 0 '2 3 1  ' Moving slowly or fidgety/restless 2 0 0 3 0  Suicidal thoughts 3 0 0 1 1  PHQ-9 Score 26 0 '17 23 15  ' Difficult doing work/chores Extremely dIfficult - Somewhat  difficult Very difficult Very difficult  Some encounter information is confidential and restricted. Go to Review Flowsheets activity to see all data.  Some recent data might be hidden   HYPOTHYROIDISM Thyroid control status:stable Satisfied with current treatment? yes Medication side effects: no Medication compliance: excellent compliance Recent dose adjustment:no Fatigue: yes Cold intolerance: no Heat intolerance: no Weight gain: no Weight loss: no Constipation: no Diarrhea/loose stools: no Palpitations: no Lower extremity edema: no Anxiety/depressed mood: yes   Depression Screen done today and results listed below:  Depression screen [facility] 2/9 04/03/2021 11/11/2020 08/16/2020 02/01/2020 12/11/2019  Decreased Interest 3 0 '2 2 2  ' Down, Depressed, Hopeless 3 0 '2 2 1  ' PHQ - 2 Score 6 0 '4 4 3  ' Altered sleeping 3 0 '3 3 2  ' Tired, decreased energy 3 0 '3 3 3  ' Change in appetite 3 0 '3 3 3  ' Feeling bad or failure about yourself  3 0 '2 3 2  ' Trouble concentrating 3 0 '2 3 1  ' Moving slowly or fidgety/restless 2 0 0 3 0  Suicidal thoughts 3 0 0 1 1  PHQ-9 Score 26 0 '17 23 15  ' Difficult doing work/chores Extremely dIfficult - Somewhat difficult Very difficult Very difficult  Some encounter information is confidential and restricted. Go to Review Flowsheets activity to see all data.  Some recent data might be hidden    Past  Medical History:  Past Medical History:  Diagnosis Date   Anemia    Anxiety    Chicken pox    Depression    Eating disorder    Has had residential treatment and hospitalization previously.   Heart murmur    as a child   History of anemia    RECIEVES IRON INFUSIONS YEARLY   History of pulmonary embolus (PE) 2002   bilaterally-followed by [facility] hematology-w/u negative per pt   Hypothyroidism    no meds currently   PTSD (post-traumatic stress disorder)     Surgical History:  Past Surgical History:  Procedure Laterality Date   CHOLECYSTECTOMY  2003   EXCISION OF  ENDOMETRIOMA     GASTRIC BYPASS  2003   LAPAROSCOPIC OVARIAN CYSTECTOMY Left 05/10/2018   Procedure: LAPAROSCOPIC OVARIAN CYSTECTOMY;  Surgeon: Homero Fellers, MD;  Location: [facility] ORS;  Service: Gynecology;  Laterality: Left;    Medications:  Current Outpatient Medications on File Prior to Visit  Medication Sig   Cyanocobalamin (B-12 COMPLIANCE INJECTION) 1000 MCG/ML KIT Inject 1,000 mcg as directed every 30 (thirty) days.   EPINEPHrine 0.3 mg/0.3 mL IJ SOAJ injection SMARTSIG:0.3 Milliliter(s) IM Once PRN   gabapentin (NEURONTIN) 100 MG capsule Take 1 capsule (100 mg total) by mouth at bedtime.   hydrOXYzine (ATARAX/VISTARIL) 25 MG tablet Take 25 mg by mouth 3 (three) times daily.   levonorgestrel (MIRENA) 20 MCG/24HR IUD 1 each by Intrauterine route once.   Multiple Vitamin (MULTIVITAMIN) tablet Take 1 tablet by mouth daily.   sertraline (ZOLOFT) 100 MG tablet Take 2 tablets (200 mg total) by mouth at bedtime. (Patient taking differently: Take 250 mg by mouth at bedtime.)   Azelaic Acid (FINACEA) 15 % FOAM Apply to face 1-2 times a day for rosacea. (Patient not taking: No sig reported)   clobetasol cream (TEMOVATE) 0.05 % Apply to affected areas rash 1-2 times a day as directed. Avoid face, groin, underarms. (Patient not taking: No sig reported)   cloNIDine (CATAPRES) 0.1 MG tablet Take 0.1 mg by mouth 2 (two) times daily.   prazosin (MINIPRESS) 1 MG capsule Take 1 capsule (1 mg total) by mouth at bedtime. (Patient not taking: Reported on 04/03/2021)   No current facility-administered medications on file prior to visit.    Allergies:  Allergies  Allergen Reactions   Penicillins Hives    Has patient had a PCN reaction causing immediate rash, facial/tongue/throat swelling, SOB or lightheadedness with hypotension: yes Has patient had a PCN reaction causing severe rash involving mucus membranes or skin necrosis: no Has patient had a PCN reaction that required hospitalization: yes Has  patient had a PCN reaction occurring within the last 10 years: no If all of the above answers are "NO", then may proceed with Cephalosporin use.    Azithromycin    Other     Narcotics- Burning in the stomach (whether PO or IV)   Strawberry (Diagnostic) Swelling    AND WALNUTS-TONGUE SWELLING    Social History:  Social History   Socioeconomic History   Marital status: Single    Spouse name: Not on file   Number of children: 0   Years of education: Not on file   Highest education level: Master's degree (e.g., MA, MS, MEng, MEd, MSW, MBA)  Occupational History   Not on file  Tobacco Use   Smoking status: Never   Smokeless tobacco: Never  Vaping Use   Vaping Use: Never used  Substance and Sexual Activity   Alcohol  use: Yes    Alcohol/week: 0.0 - 1.0 standard drinks    Comment: rare   Drug use: No   Sexual activity: Yes    Birth control/protection: I.U.D.    Comment: Mirena   Other Topics Concern   Not on file  Social History Narrative   Not on file   Social Determinants of Health   Financial Resource Strain: Not on file  Food Insecurity: Not on file  Transportation Needs: Not on file  Physical Activity: Not on file  Stress: Not on file  Social Connections: Not on file  Intimate Partner Violence: Not on file   Social History   Tobacco Use  Smoking Status Never  Smokeless Tobacco Never   Social History   Substance and Sexual Activity  Alcohol Use Yes   Alcohol/week: 0.0 - 1.0 standard drinks   Comment: rare    Family History:  Family History  Problem Relation Age of Onset   Depression Father    Diabetes Father    Anxiety disorder Father    Alzheimer's disease Maternal Grandfather    Alzheimer's disease Maternal Grandmother    Prostate cancer Paternal Grandfather    Lung cancer Paternal Grandmother     Past medical history, surgical history, medications, allergies, family history and social history reviewed with patient today and changes made to  appropriate areas of the chart.   Review of Systems  Constitutional: Negative.   HENT: Negative.    Eyes: Negative.   Respiratory: Negative.    Cardiovascular:  Positive for chest pain and palpitations. Negative for orthopnea, claudication, leg swelling and PND.  Gastrointestinal: Negative.   Genitourinary: Negative.   Musculoskeletal: Negative.   Skin:  Positive for rash. Negative for itching.  Neurological:  Positive for dizziness. Negative for tingling, tremors, sensory change, speech change, focal weakness, seizures, loss of consciousness, weakness and headaches.  Endo/Heme/Allergies:  Positive for environmental allergies. Negative for polydipsia. Does not bruise/bleed easily.  Psychiatric/Behavioral:  Positive for depression and suicidal ideas. Negative for hallucinations, memory loss and substance abuse. The patient is nervous/anxious and has insomnia.   All other ROS negative except what is listed above and in the HPI.      Objective:    BP 101/66   Pulse 74   Temp 98.4 F (36.9 C)   Ht '5\' 3"'  (1.6 m)   SpO2 97%   BMI 51.18 kg/m   Wt Readings from Last 3 Encounters:  11/07/20 288 lb 14.6 oz (131.1 kg)  05/20/20 280 lb (127 kg)  05/08/20 (!) 304 lb 3.8 oz (138 kg)    Physical Exam Vitals and nursing note reviewed.  Constitutional:      General: She is not in acute distress.    Appearance: Normal appearance. She is not ill-appearing, toxic-appearing or diaphoretic.  HENT:     Head: Normocephalic and atraumatic.     Right Ear: Tympanic membrane, ear canal and external ear normal. There is no impacted cerumen.     Left Ear: Tympanic membrane, ear canal and external ear normal. There is no impacted cerumen.     Nose: Nose normal. No congestion or rhinorrhea.     Mouth/Throat:     Mouth: Mucous membranes are moist.     Pharynx: Oropharynx is clear. No oropharyngeal exudate or posterior oropharyngeal erythema.  Eyes:     General: No scleral icterus.       Right eye: No  discharge.        Left eye: No discharge.  Extraocular Movements: Extraocular movements intact.     Conjunctiva/sclera: Conjunctivae normal.     Pupils: Pupils are equal, round, and reactive to light.  Neck:     Vascular: No carotid bruit.  Cardiovascular:     Rate and Rhythm: Normal rate and regular rhythm.     Pulses: Normal pulses.     Heart sounds: No murmur heard.   No friction rub. No gallop.  Pulmonary:     Effort: Pulmonary effort is normal. No respiratory distress.     Breath sounds: Normal breath sounds. No stridor. No wheezing, rhonchi or rales.  Chest:     Chest wall: No tenderness.  Abdominal:     General: Abdomen is flat. Bowel sounds are normal. There is no distension.     Palpations: Abdomen is soft. There is no mass.     Tenderness: There is no abdominal tenderness. There is no right CVA tenderness, left CVA tenderness, guarding or rebound.     Hernia: No hernia is present.  Genitourinary:    Comments: Breast and pelvic exams deferred with shared decision making Musculoskeletal:        General: No swelling, tenderness, deformity or signs of injury.     Cervical back: Normal range of motion and neck supple. No rigidity. No muscular tenderness.     Right lower leg: No edema.     Left lower leg: No edema.  Lymphadenopathy:     Cervical: No cervical adenopathy.  Skin:    General: Skin is warm and dry.     Capillary Refill: Capillary refill takes less than 2 seconds.     Coloration: Skin is not jaundiced or pale.     Findings: No bruising, erythema, lesion or rash.  Neurological:     General: No focal deficit present.     Mental Status: She is alert and oriented to person, place, and time. Mental status is at baseline.     Cranial Nerves: No cranial nerve deficit.     Sensory: No sensory deficit.     Motor: No weakness.     Coordination: Coordination normal.     Gait: Gait normal.     Deep Tendon Reflexes: Reflexes normal.  Psychiatric:        Mood and  Affect: Mood normal.        Behavior: Behavior normal.        Thought Content: Thought content normal.        Judgment: Judgment normal.    Results for orders placed or performed in visit on 04/03/21  Urine Culture   Specimen: Urine   UR  Result Value Ref Range   Urine Culture, Routine Final report    Organism ID, Bacteria No growth   Microscopic Examination   Urine  Result Value Ref Range   WBC, UA 0-5 0 - 5 /hpf   RBC None seen 0 - 2 /hpf   Epithelial Cells (non renal) 0-10 0 - 10 /hpf   Bacteria, UA Few (A) None seen/Few  CBC with Differential/Platelet  Result Value Ref Range   WBC 10.1 3.4 - 10.8 x10E3/uL   RBC 5.13 3.77 - 5.28 x10E6/uL   Hemoglobin 14.6 11.1 - 15.9 g/dL   Hematocrit 44.0 34.0 - 46.6 %   MCV 86 79 - 97 fL   MCH 28.5 26.6 - 33.0 pg   MCHC 33.2 31.5 - 35.7 g/dL   RDW 12.9 11.7 - 15.4 %   Platelets 317 150 - 450 x10E3/uL   Neutrophils  64 Not Estab. %   Lymphs 24 Not Estab. %   Monocytes 7 Not Estab. %   Eos 4 Not Estab. %   Basos 1 Not Estab. %   Neutrophils Absolute 6.5 1.4 - 7.0 x10E3/uL   Lymphocytes Absolute 2.4 0.7 - 3.1 x10E3/uL   Monocytes Absolute 0.7 0.1 - 0.9 x10E3/uL   EOS (ABSOLUTE) 0.4 0.0 - 0.4 x10E3/uL   Basophils Absolute 0.1 0.0 - 0.2 x10E3/uL   Immature Granulocytes 0 Not Estab. %   Immature Grans (Abs) 0.0 0.0 - 0.1 x10E3/uL  Comprehensive metabolic panel  Result Value Ref Range   Glucose 93 65 - 99 mg/dL   BUN 23 6 - 24 mg/dL   Creatinine, Ser 0.83 0.57 - 1.00 mg/dL   eGFR 91 >59 mL/min/1.73   BUN/Creatinine Ratio 28 (H) 9 - 23   Sodium 139 134 - 144 mmol/L   Potassium 4.6 3.5 - 5.2 mmol/L   Chloride 103 96 - 106 mmol/L   CO2 20 20 - 29 mmol/L   Calcium 10.1 8.7 - 10.2 mg/dL   Total Protein 6.9 6.0 - 8.5 g/dL   Albumin 4.8 3.8 - 4.8 g/dL   Globulin, Total 2.1 1.5 - 4.5 g/dL   Albumin/Globulin Ratio 2.3 (H) 1.2 - 2.2   Bilirubin Total 0.2 0.0 - 1.2 mg/dL   Alkaline Phosphatase 97 44 - 121 IU/L   AST 16 0 - 40 IU/L    ALT 15 0 - 32 IU/L  Lipid Panel w/o Chol/HDL Ratio  Result Value Ref Range   Cholesterol, Total 242 (H) 100 - 199 mg/dL   Triglycerides 153 (H) 0 - 149 mg/dL   HDL 67 >39 mg/dL   VLDL Cholesterol Cal 27 5 - 40 mg/dL   LDL Chol Calc (NIH) 148 (H) 0 - 99 mg/dL  Urinalysis, Routine w reflex microscopic  Result Value Ref Range   Specific Gravity, UA 1.025 1.005 - 1.030   pH, UA 5.5 5.0 - 7.5   Color, UA Yellow Yellow   Appearance Ur Cloudy (A) Clear   Leukocytes,UA Trace (A) Negative   Protein,UA Negative Negative/Trace   Glucose, UA Negative Negative   Ketones, UA Negative Negative   RBC, UA Negative Negative   Bilirubin, UA Negative Negative   Urobilinogen, Ur 0.2 0.2 - 1.0 mg/dL   Nitrite, UA Negative Negative   Microscopic Examination See below:   TSH  Result Value Ref Range   TSH 2.100 0.450 - 4.500 uIU/mL  B12  Result Value Ref Range   Vitamin B-12 463 232 - 1,245 pg/mL  VITAMIN D 25 Hydroxy (Vit-D Deficiency, Fractures)  Result Value Ref Range   Vit D, 25-Hydroxy 12.8 (L) 30.0 - 100.0 ng/mL  Iron and TIBC  Result Value Ref Range   Total Iron Binding Capacity 283 250 - 450 ug/dL   UIBC 227 131 - 425 ug/dL   Iron 56 27 - 159 ug/dL   Iron Saturation 20 15 - 55 %  Ferritin  Result Value Ref Range   Ferritin 134 15 - 150 ng/mL      Assessment & Plan:   Problem List Items Addressed This Visit       Endocrine   Hypothyroidism    Rechecking labs today. Await results. Treat as needed.        Relevant Medications   levothyroxine (SYNTHROID) 125 MCG tablet   Other Relevant Orders   TSH (Completed)     Other   S/P gastric bypass  Rechecking labs today Await results. Treat as needed.        Relevant Orders   Comprehensive metabolic panel (Completed)   Lipid Panel w/o Chol/HDL Ratio (Completed)   B12 (Completed)   VITAMIN D 25 Hydroxy (Vit-D Deficiency, Fractures) (Completed)   Vitamin K1, Serum   Iron and TIBC (Completed)   Ferritin (Completed)    History of eating disorder    See discussion under depression       Relevant Orders   Ambulatory referral to Psychiatry   PTSD (post-traumatic stress disorder)    See discussion under depression.        Relevant Orders   Ambulatory referral to Psychiatry   Iron deficiency anemia    Rechecking labs today Await results. Treat as needed.        Relevant Orders   CBC with Differential/Platelet (Completed)   B12 deficiency    Rechecking labs today Await results. Treat as needed.        Relevant Orders   CBC with Differential/Platelet (Completed)   Severe recurrent major depression without psychotic features ([place])    Hanging in there, but not doing great. Currently in a partial hospitalization program. Looking for someone to continue trauma work with. Will refer to [employer]. May be a little bit of a wait. Continue to monitor closely. Call with any concerns.        Relevant Orders   Ambulatory referral to Psychiatry   Generalized anxiety disorder    See discussion under depression       Relevant Orders   Ambulatory referral to Psychiatry   Other Visit Diagnoses     Routine general medical examination at a health care facility    -  Primary   Vaccines up to date. Screening labs checked today. Pap up to date. Mammogram ordered today. Call with any concerns. Continue to monitor.    Relevant Orders   CBC with Differential/Platelet (Completed)   Comprehensive metabolic panel (Completed)   Lipid Panel w/o Chol/HDL Ratio (Completed)   Urinalysis, Routine w reflex microscopic (Completed)   TSH (Completed)   B12 (Completed)   VITAMIN D 25 Hydroxy (Vit-D Deficiency, Fractures) (Completed)   Vitamin K1, Serum   Iron and TIBC (Completed)   Ferritin (Completed)   Heat rash       Will treat with lotrsone. Call if not getting better or getting worse.    Pyuria       Checking culture. Await results.    Relevant Orders   Urine Culture (Completed)   Encounter for  screening mammogram for malignant neoplasm of breast       Mammogram ordered today.   Relevant Orders   MM DIAG BREAST TOMO BILATERAL        Follow up plan: Return in about 4 weeks (around 05/01/2021).   LABORATORY TESTING:  - Pap smear: done elsewhere  IMMUNIZATIONS:   - Tdap: Tetanus vaccination status reviewed: last tetanus booster within 10 years. - Influenza: Postponed to flu season - Pneumovax: Not applicable - Prevnar: Not applicable - COVID: Up to date  SCREENING: -Mammogram: Ordered today   PATIENT COUNSELING:   Advised to take 1 mg of folate supplement per day if capable of pregnancy.   Sexuality: Discussed sexually transmitted diseases, partner selection, use of condoms, avoidance of unintended pregnancy  and contraceptive alternatives.   Advised to avoid cigarette smoking.  I discussed with the patient that most people either abstain from alcohol or drink within safe limits (<=14/week and <=4  drinks/occasion for males, <=7/weeks and <= 3 drinks/occasion for females) and that the risk for alcohol disorders and other health effects rises proportionally with the number of drinks per week and how often a drinker exceeds daily limits.  Discussed cessation/primary prevention of drug use and availability of treatment for abuse.   Diet: Encouraged to adjust caloric intake to maintain  or achieve ideal body weight, to reduce intake of dietary saturated fat and total fat, to limit sodium intake by avoiding high sodium foods and not adding table salt, and to maintain adequate dietary potassium and calcium preferably from fresh fruits, vegetables, and low-fat dairy products.    stressed the importance of regular exercise  Injury prevention: Discussed safety belts, safety helmets, smoke detector, smoking near bedding or upholstery.   Dental health: Discussed importance of regular tooth brushing, flossing, and dental visits.    NEXT PREVENTATIVE PHYSICAL DUE IN 1  YEAR. Return in about 4 weeks (around 05/01/2021).

## 2021-04-04 ENCOUNTER — Other Ambulatory Visit: Payer: Self-pay | Admitting: Family Medicine

## 2021-04-04 ENCOUNTER — Other Ambulatory Visit (HOSPITAL_COMMUNITY): Payer: BC Managed Care – PPO | Admitting: Licensed Clinical Social Worker

## 2021-04-04 ENCOUNTER — Other Ambulatory Visit: Payer: Self-pay

## 2021-04-04 ENCOUNTER — Encounter (HOSPITAL_COMMUNITY): Payer: Self-pay

## 2021-04-04 ENCOUNTER — Other Ambulatory Visit (HOSPITAL_COMMUNITY): Payer: BC Managed Care – PPO | Admitting: Occupational Therapy

## 2021-04-04 DIAGNOSIS — F332 Major depressive disorder, recurrent severe without psychotic features: Secondary | ICD-10-CM

## 2021-04-04 DIAGNOSIS — F4310 Post-traumatic stress disorder, unspecified: Secondary | ICD-10-CM

## 2021-04-04 DIAGNOSIS — R4589 Other symptoms and signs involving emotional state: Secondary | ICD-10-CM

## 2021-04-04 DIAGNOSIS — R41844 Frontal lobe and executive function deficit: Secondary | ICD-10-CM

## 2021-04-04 LAB — COMPREHENSIVE METABOLIC PANEL
ALT: 15 IU/L (ref 0–32)
AST: 16 IU/L (ref 0–40)
Albumin/Globulin Ratio: 2.3 — ABNORMAL HIGH (ref 1.2–2.2)
Albumin: 4.8 g/dL (ref 3.8–4.8)
Alkaline Phosphatase: 97 IU/L (ref 44–121)
BUN/Creatinine Ratio: 28 — ABNORMAL HIGH (ref 9–23)
BUN: 23 mg/dL (ref 6–24)
Bilirubin Total: 0.2 mg/dL (ref 0.0–1.2)
CO2: 20 mmol/L (ref 20–29)
Calcium: 10.1 mg/dL (ref 8.7–10.2)
Chloride: 103 mmol/L (ref 96–106)
Creatinine, Ser: 0.83 mg/dL (ref 0.57–1.00)
Globulin, Total: 2.1 g/dL (ref 1.5–4.5)
Glucose: 93 mg/dL (ref 65–99)
Potassium: 4.6 mmol/L (ref 3.5–5.2)
Sodium: 139 mmol/L (ref 134–144)
Total Protein: 6.9 g/dL (ref 6.0–8.5)
eGFR: 91 mL/min/{1.73_m2} (ref >59)

## 2021-04-04 LAB — FERRITIN: Ferritin: 134 ng/mL (ref 15–150)

## 2021-04-04 LAB — CBC WITH DIFFERENTIAL/PLATELET
Basophils Absolute: 0.1 10*3/uL (ref 0.0–0.2)
Basos: 1 %
EOS (ABSOLUTE): 0.4 10*3/uL (ref 0.0–0.4)
Eos: 4 %
Hematocrit: 44 % (ref 34.0–46.6)
Hemoglobin: 14.6 g/dL (ref 11.1–15.9)
Immature Grans (Abs): 0 10*3/uL (ref 0.0–0.1)
Immature Granulocytes: 0 %
Lymphocytes Absolute: 2.4 10*3/uL (ref 0.7–3.1)
Lymphs: 24 %
MCH: 28.5 pg (ref 26.6–33.0)
MCHC: 33.2 g/dL (ref 31.5–35.7)
MCV: 86 fL (ref 79–97)
Monocytes Absolute: 0.7 10*3/uL (ref 0.1–0.9)
Monocytes: 7 %
Neutrophils Absolute: 6.5 10*3/uL (ref 1.4–7.0)
Neutrophils: 64 %
Platelets: 317 10*3/uL (ref 150–450)
RBC: 5.13 x10E6/uL (ref 3.77–5.28)
RDW: 12.9 % (ref 11.7–15.4)
WBC: 10.1 10*3/uL (ref 3.4–10.8)

## 2021-04-04 LAB — IRON AND TIBC
Iron Saturation: 20 % (ref 15–55)
Iron: 56 ug/dL (ref 27–159)
Total Iron Binding Capacity: 283 ug/dL (ref 250–450)
UIBC: 227 ug/dL (ref 131–425)

## 2021-04-04 LAB — LIPID PANEL W/O CHOL/HDL RATIO
Cholesterol, Total: 242 mg/dL — ABNORMAL HIGH (ref 100–199)
HDL: 67 mg/dL (ref >39)
LDL Chol Calc (NIH): 148 mg/dL — ABNORMAL HIGH (ref 0–99)
Triglycerides: 153 mg/dL — ABNORMAL HIGH (ref 0–149)
VLDL Cholesterol Cal: 27 mg/dL (ref 5–40)

## 2021-04-04 LAB — VITAMIN D 25 HYDROXY (VIT D DEFICIENCY, FRACTURES): Vit D, 25-Hydroxy: 12.8 ng/mL — ABNORMAL LOW (ref 30.0–100.0)

## 2021-04-04 LAB — TSH: TSH: 2.1 u[IU]/mL (ref 0.450–4.500)

## 2021-04-04 LAB — VITAMIN B12: Vitamin B-12: 463 pg/mL (ref 232–1245)

## 2021-04-04 MED ORDER — VITAMIN D (ERGOCALCIFEROL) 1.25 MG (50000 UNIT) PO CAPS: 50000.0000 [IU] | ORAL_CAPSULE | ORAL | 1 refills | Status: DC

## 2021-04-04 NOTE — Therapy (Signed)
[facility] PARTIAL HOSPITALIZATION PROGRAM [address] [place], [place], [address] Phone: [phone]   Fax:  [phone] Virtual Visit via Video Note  I connected with Sharon Gibson on 04/04/21 at  11:00 AM EDT by a video enabled telemedicine application and verified that I am speaking with the correct person using two identifiers.  Location: Patient: Patient Home Provider: Clinic Office   I discussed the limitations of evaluation and management by telemedicine and the availability of in person appointments. The patient expressed understanding and agreed to proceed.   I discussed the assessment and treatment plan with the patient. The patient was provided an opportunity to ask questions and all were answered. The patient agreed with the plan and demonstrated an understanding of the instructions.   The patient was advised to call back or seek an in-person evaluation if the symptoms worsen or if the condition fails to improve as anticipated.  I provided 60 minutes of non-face-to-face time during this encounter.   Donne Hazel, OT   Occupational Therapy Treatment  Patient Details  Name: Sharon Gibson MRN: [id number] Date of Birth: 1981-08-25 Referring Provider (OT): Hillery Jacks   Encounter Date: 04/04/2021   OT End of Session - 04/04/21 1423     Visit Number 9    Number of Visits 20    Date for OT Re-Evaluation 04/18/21    Authorization Type BCBS    OT Start Time 1115    OT Stop Time 1215    OT Time Calculation (min) 60 min    Activity Tolerance Patient tolerated treatment well    Behavior During Therapy WFL for tasks assessed/performed             Past Medical History:  Diagnosis Date   Anemia    Anxiety    Chicken pox    Depression    Eating disorder    Has had residential treatment and hospitalization previously.   Heart murmur    as a child   History of anemia    RECIEVES IRON INFUSIONS YEARLY   History of  pulmonary embolus (PE) 2002   bilaterally-followed by [facility] hematology-w/u negative per pt   Hypothyroidism    no meds currently   PTSD (post-traumatic stress disorder)     Past Surgical History:  Procedure Laterality Date   CHOLECYSTECTOMY  2003   EXCISION OF ENDOMETRIOMA     GASTRIC BYPASS  2003   LAPAROSCOPIC OVARIAN CYSTECTOMY Left 05/10/2018   Procedure: LAPAROSCOPIC OVARIAN CYSTECTOMY;  Surgeon: Natale Milch, MD;  Location: [facility] ORS;  Service: Gynecology;  Laterality: Left;    There were no vitals filed for this visit.   Subjective Assessment - 04/04/21 1423     Currently in Pain? No/denies              OT Education - 04/04/21 1423     Education Details Educated on concept of sensory modulation and self-soothing as coping strategies through use of the eight senses    Person(s) Educated Patient    Methods Explanation;Handout    Comprehension Verbalized understanding              OT Short Term Goals - 03/24/21 1344       OT SHORT TERM GOAL #1   Status On-going      OT SHORT TERM GOAL #2   Status On-going      OT SHORT TERM GOAL #3   Status On-going  Group Session:  S: "I heard about the frozen orange trick, but that doesn't work for me."  O: Today's group session focused on topic of sensory modulation and self-soothing through use of the 8 senses. Discussion introduced the concept of sensory modulation and integration, focusing on how we can utilize our body and it's senses to self-soothe or cope, when we are experiencing an over or under-whelming sensation or feeling. Group members were introduced to a sensory diet checklist as a helpful tool/resource that can be utilized to identify what activities and strategies we prefer and do not prefer based upon our response to different stimulus. The concept of alerting vs calming activities was also introduced to understand how to counteract how we are feeling (Example: when we are feeling  overwhelmed/stressed, engage in something calming. When we are feeling depressed/low energy, engage in something alerting). Group members engaged actively in discussion sharing their own personal sensory likes/dislikes.    A: Sharon Gibson was active and independent in her participation of discussion and activity, sharing sensory strategies and preferences that she has used in the past and current. Pt expressed benefit of going over and creating a sensory diet checklist and while present identified several calming activities including stretching, yoga, and the feeling of certain fabrics. Pt identified several alerting activities including dancing and re-arranging furniture. Appeared open and receptive to education received on use of sensory strategies as a way to self-soothe.   P: Continue to attend PHP OT group sessions 5x week for 1 weeks to promote daily structure, social engagement, and opportunities to develop and utilize adaptive strategies to maximize functional performance in preparation for safe transition and integration back into school, work, and the community.   Plan - 04/04/21 1423     Occupational performance deficits (Please refer to evaluation for details): ADL's;IADL's;Rest and Sleep;Education;Work;Leisure;Social Participation    Body Structure / Function / Physical Skills ADL    Cognitive Skills Attention;Emotional;Energy/Drive;Learn;Memory;Perception;Problem Solve;Safety Awareness;Temperament/Personality;Thought;Understand    Psychosocial Skills Coping Strategies;Environmental  Adaptations;Habits;Interpersonal Interaction;Routines and Behaviors             Patient will benefit from skilled therapeutic intervention in order to improve the following deficits and impairments:   Body Structure / Function / Physical Skills: ADL Cognitive Skills: Attention, Emotional, Energy/Drive, Learn, Memory, Perception, Problem Solve, Safety Awareness, Temperament/Personality, Thought,  Understand Psychosocial Skills: Coping Strategies, Environmental  Adaptations, Habits, Interpersonal Interaction, Routines and Behaviors   Visit Diagnosis: Difficulty coping  Frontal lobe and executive function deficit  Severe episode of recurrent major depressive disorder, without psychotic features (HCC)    Problem List Patient Active Problem List   Diagnosis Date Noted   Cervical strain 11/28/2019   Endometriosis determined by laparoscopy 06/13/2018   Ovarian cyst 03/01/2018   Hx pulmonary embolism 02/03/2018   Rash 10/16/2016   Generalized anxiety disorder 10/12/2016   Right knee pain 09/02/2016   Severe recurrent major depression without psychotic features (HCC) 07/23/2016   Bulimia nervosa, in partial remission, mild 07/23/2016    Class: Chronic   Iron deficiency anemia 04/08/2016   B12 deficiency 04/08/2016   S/P gastric bypass 03/20/2016   History of eating disorder 03/20/2016   Insomnia 03/20/2016   PTSD (post-traumatic stress disorder) 03/20/2016   Hypothyroidism 03/20/2016   PCOS (polycystic ovarian syndrome) 12/14/2014    04/04/2021  Donne Hazel, MOT, OTR/L  04/04/2021, 2:24 PM  [facility] PARTIAL HOSPITALIZATION PROGRAM [address] [place], [place], [address] Phone: [phone]   Fax:  [phone]  Name:  Sharon Gibson MRN: [id number] Date of Birth: 08/29/1981

## 2021-04-05 ENCOUNTER — Encounter: Payer: Self-pay | Admitting: Family Medicine

## 2021-04-05 LAB — URINE CULTURE: Organism ID, Bacteria: NO GROWTH

## 2021-04-05 MED ORDER — LEVOTHYROXINE SODIUM 125 MCG PO TABS: 125.0000 ug | ORAL_TABLET | Freq: Every day | ORAL | 3 refills | Status: DC

## 2021-04-05 NOTE — Assessment & Plan Note (Signed)
See discussion under depression.  ?

## 2021-04-05 NOTE — Assessment & Plan Note (Signed)
Rechecking labs today. Await results. Treat as needed.  °

## 2021-04-05 NOTE — Assessment & Plan Note (Signed)
Hanging in there, but not doing great. Currently in a partial hospitalization program. Looking for someone to continue trauma work with. Will refer to [employer]. May be a little bit of a wait. Continue to monitor closely. Call with any concerns.

## 2021-04-07 ENCOUNTER — Encounter (HOSPITAL_COMMUNITY): Payer: Self-pay

## 2021-04-07 ENCOUNTER — Other Ambulatory Visit (HOSPITAL_COMMUNITY): Payer: BC Managed Care – PPO | Admitting: Licensed Clinical Social Worker

## 2021-04-07 ENCOUNTER — Other Ambulatory Visit (HOSPITAL_COMMUNITY): Payer: BC Managed Care – PPO | Admitting: Occupational Therapy

## 2021-04-07 ENCOUNTER — Other Ambulatory Visit: Payer: Self-pay

## 2021-04-07 DIAGNOSIS — F4310 Post-traumatic stress disorder, unspecified: Secondary | ICD-10-CM

## 2021-04-07 DIAGNOSIS — F411 Generalized anxiety disorder: Secondary | ICD-10-CM

## 2021-04-07 DIAGNOSIS — R41844 Frontal lobe and executive function deficit: Secondary | ICD-10-CM

## 2021-04-07 DIAGNOSIS — F332 Major depressive disorder, recurrent severe without psychotic features: Secondary | ICD-10-CM | POA: Diagnosis not present

## 2021-04-07 DIAGNOSIS — R4589 Other symptoms and signs involving emotional state: Secondary | ICD-10-CM

## 2021-04-07 NOTE — Therapy (Signed)
[facility] PARTIAL HOSPITALIZATION PROGRAM [address] [place], [place], [address] Phone: [phone]   Fax:  [phone] Virtual Visit via Video Note  I connected with Sharon Gibson on 04/07/21 at  11:00 AM EDT by a video enabled telemedicine application and verified that I am speaking with the correct person using two identifiers.  Location: Patient: Patient Home Provider: Clinic Office   I discussed the limitations of evaluation and management by telemedicine and the availability of in person appointments. The patient expressed understanding and agreed to proceed.   I discussed the assessment and treatment plan with the patient. The patient was provided an opportunity to ask questions and all were answered. The patient agreed with the plan and demonstrated an understanding of the instructions.   The patient was advised to call back or seek an in-person evaluation if the symptoms worsen or if the condition fails to improve as anticipated.  I provided 60 minutes of non-face-to-face time during this encounter.   Sharon Gibson, OT   Occupational Therapy Treatment  Patient Details  Name: Sharon Gibson MRN: [id number] Date of Birth: [birth year] Referring Provider (OT): Hillery Jacks   Encounter Date: 04/07/2021   OT End of Session - 04/07/21 1310     Visit Number 10    Number of Visits 20    Date for OT Re-Evaluation 04/18/21    Authorization Type BCBS    OT Start Time 1030    OT Stop Time 1130    OT Time Calculation (min) 60 min    Activity Tolerance Patient tolerated treatment well    Behavior During Therapy WFL for tasks assessed/performed             Past Medical History:  Diagnosis Date   Anemia    Anxiety    Chicken pox    Depression    Eating disorder    Has had residential treatment and hospitalization previously.   Heart murmur    as a child   History of anemia    RECIEVES IRON INFUSIONS YEARLY   History of  pulmonary embolus (PE) 2002   bilaterally-followed by [facility] hematology-w/u negative per pt   Hypothyroidism    no meds currently   PTSD (post-traumatic stress disorder)     Past Surgical History:  Procedure Laterality Date   CHOLECYSTECTOMY  2003   EXCISION OF ENDOMETRIOMA     GASTRIC BYPASS  2003   LAPAROSCOPIC OVARIAN CYSTECTOMY Left 05/10/2018   Procedure: LAPAROSCOPIC OVARIAN CYSTECTOMY;  Surgeon: Sharon Milch, MD;  Location: [facility] ORS;  Service: Gynecology;  Laterality: Left;    There were no vitals filed for this visit.   Subjective Assessment - 04/07/21 1310     Currently in Pain? No/denies               OT Education - 04/07/21 1310     Education Details Educated on physical symptomology of stress and its effects on the body, along with positive stress management tips/strategies    Person(s) Educated Patient    Methods Explanation;Handout    Comprehension Verbalized understanding              OT Short Term Goals - 03/24/21 1344       OT SHORT TERM GOAL #1   Status On-going      OT SHORT TERM GOAL #2   Status On-going      OT SHORT TERM GOAL #3   Status On-going  Group Session:  S: "My biggest stressors right now are watching the news and anticipating going back to work, even if it's 4 weeks apart."  O: Group began with a check-in and review of previous OT session focused on relaxation strategies. Today's discussion focused on the topic of stress management. Group members worked collaboratively to create a Microbiologist identifying physical signs, behavioral signs, emotional/psychological, and cognitive signs of stress. Discussion then focused and encouraged group members to identify positive stress management strategies they could utilize in those moments to manage identified signs.  A: Sharon Gibson was active and independent in her participation of discussion and activity , sharing that her current stressors are watching  the news (and the events happening in the world) and the anticipation of returning to work. Pt was quite active in her contributions to group brainstorm, highlighting what stress looks likes for her and shared that recently it has manifested itself in the form of physical panic attacks that result in chest pain. Pt was open to strategies reviewed and appeared receptive to further education identifying positive and healthy stress management techniques.   P: Continue to attend PHP OT group sessions 5x week for 1 weeks to promote daily structure, social engagement, and opportunities to develop and utilize adaptive strategies to maximize functional performance in preparation for safe transition and integration back into school, work, and the community.    Plan - 04/07/21 1311     Occupational performance deficits (Please refer to evaluation for details): ADL's;IADL's;Rest and Sleep;Education;Work;Leisure;Social Participation    Body Structure / Function / Physical Skills ADL    Cognitive Skills Attention;Emotional;Energy/Drive;Learn;Memory;Perception;Problem Solve;Safety Awareness;Temperament/Personality;Thought;Understand    Psychosocial Skills Coping Strategies;Environmental  Adaptations;Habits;Interpersonal Interaction;Routines and Behaviors             Patient will benefit from skilled therapeutic intervention in order to improve the following deficits and impairments:   Body Structure / Function / Physical Skills: ADL Cognitive Skills: Attention, Emotional, Energy/Drive, Learn, Memory, Perception, Problem Solve, Safety Awareness, Temperament/Personality, Thought, Understand Psychosocial Skills: Coping Strategies, Environmental  Adaptations, Habits, Interpersonal Interaction, Routines and Behaviors   Visit Diagnosis: Difficulty coping  Frontal lobe and executive function deficit  Severe episode of recurrent major depressive disorder, without psychotic features (HCC)    Problem  List Patient Active Problem List   Diagnosis Date Noted   Cervical strain 11/28/2019   Endometriosis determined by laparoscopy 06/13/2018   Ovarian cyst 03/01/2018   Hx pulmonary embolism 02/03/2018   Rash 10/16/2016   Generalized anxiety disorder 10/12/2016   Right knee pain 09/02/2016   Severe recurrent major depression without psychotic features (HCC) 07/23/2016   Bulimia nervosa, in partial remission, mild 07/23/2016    Class: Chronic   Iron deficiency anemia 04/08/2016   B12 deficiency 04/08/2016   S/P gastric bypass 03/20/2016   History of eating disorder 03/20/2016   Insomnia 03/20/2016   PTSD (post-traumatic stress disorder) 03/20/2016   Hypothyroidism 03/20/2016   PCOS (polycystic ovarian syndrome) 12/14/2014    04/07/2021  Sharon Gibson, MOT, OTR/L  04/07/2021, 1:11 PM  [facility] PARTIAL HOSPITALIZATION PROGRAM [address] [place], [place], [address] Phone: [phone]   Fax:  [phone]  Name: Sharon Gibson MRN: [id number] Date of Birth: 05-Apr-1981

## 2021-04-08 ENCOUNTER — Other Ambulatory Visit (HOSPITAL_COMMUNITY): Payer: BC Managed Care – PPO | Admitting: Occupational Therapy

## 2021-04-08 ENCOUNTER — Other Ambulatory Visit (HOSPITAL_COMMUNITY): Payer: BC Managed Care – PPO | Admitting: Licensed Clinical Social Worker

## 2021-04-08 ENCOUNTER — Other Ambulatory Visit: Payer: Self-pay

## 2021-04-08 ENCOUNTER — Telehealth (HOSPITAL_COMMUNITY): Payer: Self-pay | Admitting: Psychiatry

## 2021-04-08 ENCOUNTER — Encounter (HOSPITAL_COMMUNITY): Payer: Self-pay

## 2021-04-08 DIAGNOSIS — R41844 Frontal lobe and executive function deficit: Secondary | ICD-10-CM

## 2021-04-08 DIAGNOSIS — F332 Major depressive disorder, recurrent severe without psychotic features: Secondary | ICD-10-CM | POA: Diagnosis not present

## 2021-04-08 DIAGNOSIS — R4589 Other symptoms and signs involving emotional state: Secondary | ICD-10-CM

## 2021-04-08 DIAGNOSIS — F4310 Post-traumatic stress disorder, unspecified: Secondary | ICD-10-CM

## 2021-04-08 NOTE — Telephone Encounter (Signed)
D:  Placed call to introduce and orient pt to MH-IOP, but there was no answer.  Pt will be starting on 04-15-21.  A:  Left cm name and phone #.

## 2021-04-08 NOTE — Progress Notes (Signed)
Virtual Visit via Video Note  I connected with Sharon Gibson on 04/09/21 at  9:00 AM EDT by a video enabled telemedicine application and verified that I am speaking with the correct person using two identifiers.  Location: Patient: Home Provider: Office   I discussed the limitations of evaluation and management by telemedicine and the availability of in person appointments. The patient expressed understanding and agreed to proceed.     I discussed the assessment and treatment plan with the patient. The patient was provided an opportunity to ask questions and all were answered. The patient agreed with the plan and demonstrated an understanding of the instructions.   The patient was advised to call back or seek an in-person evaluation if the symptoms worsen or if the condition fails to improve as anticipated.  I provided 15 minutes of non-face-to-face time during this encounter.   Derrill Center, NP   [facility] MD/PA/NP OP Progress Note  04/09/2021 9:13 AM Sharon Gibson  MRN:  [id number]  Chief Complaint: Sharon Gibson reported " I see a man standing in my hallway."   Evaluation: Sharon Gibson was seen and evaluated via telemedicine.  She continues to deny suicidal or homicidal ideation.  Reports intermittent auditory and visual hallucinations.  However describes paranoia which may be attributed to the suppressed trauma memories.  She recounts a story about a man who has been watching her and her family when she was younger.  States vivid memories of her mother telling the story.  She reports waking up "seeing a man" however, knows that the images not real.    Sharon Gibson stated that she feels that her nightmares are getting better since initiating Minipress.  We will continue 1 mg nightly dose.  She reports taking and tolerating medications well.  Sharon Gibson stated " Minipress has had positive impact on my sleep"  States frequent panic attacks however continues to work on coping mechanisms to alleviate  some of her symptoms.  patient to continue attending partial hospitalization programming.  What anticipated discharge early next week. Reports a good appetite.  States she is resting well throughout the night.  Support,encouragement and reassurance was provided.      Visit Diagnosis:    ICD-10-CM   1. PTSD (post-traumatic stress disorder)  F43.10     2. Severe recurrent major depression without psychotic features (Sharon Gibson)  F33.2       Past Psychiatric History:   Past Medical History:  Past Medical History:  Diagnosis Date   Anemia    Anxiety    Chicken pox    Depression    Eating disorder    Has had residential treatment and hospitalization previously.   Heart murmur    as a child   History of anemia    RECIEVES IRON INFUSIONS YEARLY   History of pulmonary embolus (PE) 2002   bilaterally-followed by [facility] hematology-w/u negative per pt   Hypothyroidism    no meds currently   PTSD (post-traumatic stress disorder)     Past Surgical History:  Procedure Laterality Date   CHOLECYSTECTOMY  2003   EXCISION OF ENDOMETRIOMA     GASTRIC BYPASS  2003   LAPAROSCOPIC OVARIAN CYSTECTOMY Left 05/10/2018   Procedure: LAPAROSCOPIC OVARIAN CYSTECTOMY;  Surgeon: Homero Fellers, MD;  Location: [facility] ORS;  Service: Gynecology;  Laterality: Left;    Family Psychiatric History:   Family History:  Family History  Problem Relation Age of Onset   Depression Father    Diabetes Father    Anxiety disorder Father  Alzheimer's disease Maternal Grandfather    Alzheimer's disease Maternal Grandmother    Prostate cancer Paternal Grandfather    Lung cancer Paternal Grandmother     Social History:  Social History   Socioeconomic History   Marital status: Single    Spouse name: Not on file   Number of children: 0   Years of education: Not on file   Highest education level: Master's degree (e.g., MA, MS, MEng, MEd, MSW, MBA)  Occupational History   Not on file  Tobacco Use    Smoking status: Never   Smokeless tobacco: Never  Vaping Use   Vaping Use: Never used  Substance and Sexual Activity   Alcohol use: Yes    Alcohol/week: 0.0 - 1.0 standard drinks    Comment: rare   Drug use: No   Sexual activity: Yes    Birth control/protection: I.U.D.    Comment: Mirena   Other Topics Concern   Not on file  Social History Narrative   Not on file   Social Determinants of Health   Financial Resource Strain: Not on file  Food Insecurity: Not on file  Transportation Needs: Not on file  Physical Activity: Not on file  Stress: Not on file  Social Connections: Not on file    Allergies:  Allergies  Allergen Reactions   Penicillins Hives    Has patient had a PCN reaction causing immediate rash, facial/tongue/throat swelling, SOB or lightheadedness with hypotension: yes Has patient had a PCN reaction causing severe rash involving mucus membranes or skin necrosis: no Has patient had a PCN reaction that required hospitalization: yes Has patient had a PCN reaction occurring within the last 10 years: no If all of the above answers are "NO", then may proceed with Cephalosporin use.    Azithromycin    Other     Narcotics- Burning in the stomach (whether PO or IV)   Strawberry (Diagnostic) Swelling    AND WALNUTS-TONGUE SWELLING    Metabolic Disorder Labs: Lab Results  Component Value Date   HGBA1C 5.0 11/02/2019   No results found for: PROLACTIN Lab Results  Component Value Date   CHOL 242 (H) 04/03/2021   TRIG 153 (H) 04/03/2021   HDL 67 04/03/2021   CHOLHDL 3 03/20/2016   VLDL 12.2 03/20/2016   LDLCALC 148 (H) 04/03/2021   LDLCALC 95 08/16/2020   Lab Results  Component Value Date   TSH 2.100 04/03/2021   TSH 3.997 11/14/2020    Therapeutic Level Labs: No results found for: LITHIUM No results found for: VALPROATE No components found for:  CBMZ  Current Medications: Current Outpatient Medications  Medication Sig Dispense Refill   Azelaic  Acid (FINACEA) 15 % FOAM Apply to face 1-2 times a day for rosacea. (Patient not taking: No sig reported) 50 g 5   clobetasol cream (TEMOVATE) 0.05 % Apply to affected areas rash 1-2 times a day as directed. Avoid face, groin, underarms. (Patient not taking: No sig reported) 60 g 0   cloNIDine (CATAPRES) 0.1 MG tablet Take 0.1 mg by mouth 2 (two) times daily.     Cyanocobalamin (B-12 COMPLIANCE INJECTION) 1000 MCG/ML KIT Inject 1,000 mcg as directed every 30 (thirty) days. 1 kit 3   EPINEPHrine 0.3 mg/0.3 mL IJ SOAJ injection SMARTSIG:0.3 Milliliter(s) IM Once PRN     gabapentin (NEURONTIN) 100 MG capsule Take 1 capsule (100 mg total) by mouth at bedtime. 90 capsule 1   hydrOXYzine (ATARAX/VISTARIL) 25 MG tablet Take 25 mg by mouth 3 (  three) times daily.     levonorgestrel (MIRENA) 20 MCG/24HR IUD 1 each by Intrauterine route once.     levothyroxine (SYNTHROID) 125 MCG tablet Take 1 tablet (125 mcg total) by mouth daily. 90 tablet 3   Multiple Vitamin (MULTIVITAMIN) tablet Take 1 tablet by mouth daily.     nystatin-triamcinolone ointment (MYCOLOG) Apply 1 application topically 2 (two) times daily. 30 g 0   prazosin (MINIPRESS) 1 MG capsule Take 1 capsule (1 mg total) by mouth at bedtime. (Patient not taking: Reported on 04/03/2021) 30 capsule 0   sertraline (ZOLOFT) 100 MG tablet Take 2 tablets (200 mg total) by mouth at bedtime. (Patient taking differently: Take 250 mg by mouth at bedtime.) 180 tablet 1   Vitamin D, Ergocalciferol, (DRISDOL) 1.25 MG (50000 UNIT) CAPS capsule Take 1 capsule (50,000 Units total) by mouth every 7 (seven) days. 12 capsule 1   No current facility-administered medications for this visit.     Musculoskeletal: Strength & Muscle Tone: within normal limits Gait & Station: normal Patient leans: N/A  Psychiatric Specialty Exam: Review of Systems  There were no vitals taken for this visit.There is no height or weight on file to calculate BMI.  General Appearance: Casual   Eye Contact:  Good  Speech:  Clear and Coherent  Volume:  Normal  Mood:  Anxious and Depressed  Affect:  Congruent  Thought Process:  Coherent  Orientation:  Full (Time, Place, and Person)  Thought Content: Logical   Suicidal Thoughts:  No  Homicidal Thoughts:  No  Memory:  Immediate;   Fair Recent;   Fair Remote;   Fair  Judgement:  Fair  Insight:  Fair  Psychomotor Activity:  Normal  Concentration:  Concentration: Fair  Recall:  Good  Fund of Knowledge: Good  Language: Good  Akathisia:  No  Handed:  Right  AIMS (if indicated): done  Assets:  Communication Skills Desire for Improvement Social Support  ADL's:  Intact  Cognition: WNL  Sleep:  NA   Screenings: GAD-7    Flowsheet Row Office Visit from 04/03/2021 in [place] Visit from 11/11/2020 in [place] Visit from 08/16/2020 in [place] Visit from 02/01/2020 in [place] Visit from 12/11/2019 in [place]  Total GAD-7 Score _0 PHQ2-9    [place] Visit from 04/03/2021 in [place] from 03/25/2021 in [place] Office Visit from 11/11/2020 in [place] Visit from 08/16/2020 in [place] Visit from 02/01/2020 in [place]  PHQ-2 Total Score 6 6 0 4 4  PHQ-9 Total Score 26 26 0 17 23      Flowsheet Row Counselor from 03/25/2021 in [place] and Plan:  Patient to continue Partial Hospitalization Continue Minipress 1 mg daily  Treatment plan was reviewed  and agreed upon by NP T.Bobby Rumpf and patient Sharon Gibson need for group services.     Derrill Center, NP 04/09/2021, 9:13 AM

## 2021-04-08 NOTE — Therapy (Signed)
[facility] PARTIAL HOSPITALIZATION PROGRAM [address] [place], [place], [address] Phone: [phone]   Fax:  [phone] Virtual Visit via Video Note  I connected with Sharon Gibson on 04/08/21 at  11:00 AM EDT by a video enabled telemedicine application and verified that I am speaking with the correct person using two identifiers.  Location: Patient: Patient Home Provider: Clinic Office   I discussed the limitations of evaluation and management by telemedicine and the availability of in person appointments. The patient expressed understanding and agreed to proceed.   I discussed the assessment and treatment plan with the patient. The patient was provided an opportunity to ask questions and all were answered. The patient agreed with the plan and demonstrated an understanding of the instructions.   The patient was advised to call back or seek an in-person evaluation if the symptoms worsen or if the condition fails to improve as anticipated.  I provided 60 minutes of non-face-to-face time during this encounter.   Donne Hazel, OT   Occupational Therapy Treatment  Patient Details  Name: Sharon Gibson MRN: [id number] Date of Birth: 04-Dec-1980 Referring Provider (OT): Hillery Jacks   Encounter Date: 04/08/2021   OT End of Session - 04/08/21 1554     Visit Number 11    Number of Visits 20    Date for OT Re-Evaluation 04/18/21    Authorization Type BCBS    OT Start Time 1100    OT Stop Time 1200    OT Time Calculation (min) 60 min    Activity Tolerance Patient tolerated treatment well    Behavior During Therapy WFL for tasks assessed/performed             Past Medical History:  Diagnosis Date   Anemia    Anxiety    Chicken pox    Depression    Eating disorder    Has had residential treatment and hospitalization previously.   Heart murmur    as a child   History of anemia    RECIEVES IRON INFUSIONS YEARLY   History of  pulmonary embolus (PE) 2002   bilaterally-followed by [facility] hematology-w/u negative per pt   Hypothyroidism    no meds currently   PTSD (post-traumatic stress disorder)     Past Surgical History:  Procedure Laterality Date   CHOLECYSTECTOMY  2003   EXCISION OF ENDOMETRIOMA     GASTRIC BYPASS  2003   LAPAROSCOPIC OVARIAN CYSTECTOMY Left 05/10/2018   Procedure: LAPAROSCOPIC OVARIAN CYSTECTOMY;  Surgeon: Natale Milch, MD;  Location: [facility] ORS;  Service: Gynecology;  Laterality: Left;    There were no vitals filed for this visit.   Subjective Assessment - 04/08/21 1554     Currently in Pain? No/denies                                  OT Education - 04/08/21 1554     Education Details Educated on different factors that contribute to our ability to manage our time, along with specific time management tips/strategies, including procrastination    Person(s) Educated Patient    Methods Explanation;Handout    Comprehension Verbalized understanding              OT Short Term Goals - 03/24/21 1344       OT SHORT TERM GOAL #1   Status On-going      OT SHORT TERM GOAL #2  Status On-going      OT SHORT TERM GOAL #3   Status On-going            Group Session:  S: "I don't have a work life balance, definitely not since I got this second job."  O: Group began with a warm-up ice breaker activity where patients were encouraged to reflect on the scenario "If you had 86,400 dollars dropped into your bank account at midnight and 24 hours to spend it, what you use the money for? The only rule was that any money not used disappeared after 24 hours." Warm-up activity was used as an Web designer and a way to connect that similar to the scenario of money, we do not get time back and there are 86,400 seconds in a day. Warm-up transitioned into today's group focused on topic of Time Management. Group members identified ways in which they currently struggle  with managing their time and discussion focused on alternative strategies to recognize how we can be more productive and intentional with our time and managing it appropriately. Group members also discussed specific strategies to overcome procrastination.   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 She shared that this is what led to her mental struggles before she went to the hospital and noted she did not have time for herself and self-care needs. She appeared open and receptive to strategies reviewed and shared, noting a need to reduce her hours with her second job to give herself a period of time for relaxation or self-care.   P: Continue to attend PHP OT group sessions 5x week for 1 weeks to promote daily structure, social engagement, and opportunities to develop and utilize adaptive strategies to maximize functional performance in preparation for safe transition and integration back into school, work, and the community. Plan to address topic of time management/procrastination in next OT group session.  Plan - 04/08/21 1555     Occupational performance deficits (Please refer to evaluation for details): ADL's;IADL's;Rest and Sleep;Education;Work;Leisure;Social Participation    Body Structure / Function / Physical Skills ADL    Cognitive Skills Attention;Emotional;Energy/Drive;Learn;Memory;Perception;Problem Solve;Safety Awareness;Temperament/Personality;Thought;Understand    Psychosocial Skills Coping Strategies;Environmental  Adaptations;Habits;Interpersonal Interaction;Routines and Behaviors             Patient will benefit from skilled therapeutic intervention in order to improve the following deficits and impairments:   Body Structure / Function / Physical Skills: ADL Cognitive Skills: Attention, Emotional, Energy/Drive, Learn,  Memory, Perception, Problem Solve, Safety Awareness, Temperament/Personality, Thought, Understand Psychosocial Skills: Coping Strategies, Environmental  Adaptations, Habits, Interpersonal Interaction, Routines and Behaviors   Visit Diagnosis: Difficulty coping  Frontal lobe and executive function deficit  Severe episode of recurrent major depressive disorder, without psychotic features (HCC)    Problem List Patient Active Problem List   Diagnosis Date Noted   Cervical strain 11/28/2019   Endometriosis determined by laparoscopy 06/13/2018   Ovarian cyst 03/01/2018   Hx pulmonary embolism 02/03/2018   Rash 10/16/2016   Generalized anxiety disorder 10/12/2016   Right knee pain 09/02/2016   Severe recurrent major depression without psychotic features (HCC) 07/23/2016   Bulimia nervosa, in partial remission, mild 07/23/2016    Class: Chronic   Iron deficiency anemia 04/08/2016   B12 deficiency 04/08/2016   S/P gastric bypass 03/20/2016   History of eating disorder 03/20/2016   Insomnia 03/20/2016  PTSD (post-traumatic stress disorder) 03/20/2016   Hypothyroidism 03/20/2016   PCOS (polycystic ovarian syndrome) 12/14/2014    04/08/2021  Donne Hazel, MOT, OTR/L 04/08/2021, 3:55 PM  [facility] PARTIAL HOSPITALIZATION PROGRAM [address] [place], [place], [address] Phone: [phone]   Fax:  [phone]  Name: Otila Starn MRN: [id number] Date of Birth: Nov 27, 1980

## 2021-04-09 ENCOUNTER — Other Ambulatory Visit (HOSPITAL_COMMUNITY): Payer: BC Managed Care – PPO

## 2021-04-09 ENCOUNTER — Other Ambulatory Visit: Payer: Self-pay

## 2021-04-09 ENCOUNTER — Encounter (HOSPITAL_COMMUNITY): Payer: Self-pay | Admitting: Family

## 2021-04-09 ENCOUNTER — Encounter (HOSPITAL_COMMUNITY): Payer: Self-pay

## 2021-04-09 ENCOUNTER — Other Ambulatory Visit (HOSPITAL_COMMUNITY): Payer: BC Managed Care – PPO | Admitting: Licensed Clinical Social Worker

## 2021-04-09 DIAGNOSIS — F332 Major depressive disorder, recurrent severe without psychotic features: Secondary | ICD-10-CM

## 2021-04-09 DIAGNOSIS — F4310 Post-traumatic stress disorder, unspecified: Secondary | ICD-10-CM

## 2021-04-09 NOTE — Psych (Signed)
Cln received email from pt after leaving group that stated the following: "I am with my parents now. We are not super on board with sending me to a mixed gender facility. I am spending the night at their house tonight so I'll be attending group from there tomorrow. I think inpatient would cause more crisis for me at the moment but I don't know what the answer is (especially with insurance)." Pt was advised to seek evaluation for inpatient care and has declined to so. Pt, pt's mother, and medical provider, T. Lewis, completed safety plan. Pt states she will stay with her parents and parents feel comfortable monitoring pt and pt's safety. Cln encouraged pt to reconsider inpatient evaluation should she have increase in symptoms.

## 2021-04-09 NOTE — Progress Notes (Signed)
[facility] MD/PA/NP OP Progress Note  04/09/2021 10:38 AM Sharon Gibson  MRN:  [id number]  Sharon Gibson seen and evaluated via WebEx.  She presents tearful and labile throughout this assessment.  Reporting suicidal ideations with a plan to overdose on medication.  States he did not want to "burden anybody because today is my parents anniversary".  Patient provided verbal authorization to follow-up with patient's mother [name] at [phone].  "  I do not think inpatient is best because last time that I was transferred to [facility] and they had mix gender patient's  and everybody in the same room. " Patient has trauma history.   Safety planning for patient's safety.  [name] reports she is on her way to patient's home.  patient to follow-up for mental health evaluation at the local emergency department/urgent care.  Discussed intensive therapy options.   Visit Diagnosis:    ICD-10-CM   1. Severe recurrent major depression without psychotic features ([place])  F33.2     2. PTSD (post-traumatic stress disorder)  F43.10       Past Psychiatric History:   Past Medical History:  Past Medical History:  Diagnosis Date   Anemia    Anxiety    Chicken pox    Depression    Eating disorder    Has had residential treatment and hospitalization previously.   Heart murmur    as a child   History of anemia    RECIEVES IRON INFUSIONS YEARLY   History of pulmonary embolus (PE) 2002   bilaterally-followed by [facility] hematology-w/u negative per pt   Hypothyroidism    no meds currently   PTSD (post-traumatic stress disorder)     Past Surgical History:  Procedure Laterality Date   CHOLECYSTECTOMY  2003   EXCISION OF ENDOMETRIOMA     GASTRIC BYPASS  2003   LAPAROSCOPIC OVARIAN CYSTECTOMY Left 05/10/2018   Procedure: LAPAROSCOPIC OVARIAN CYSTECTOMY;  Surgeon: Homero Fellers, MD;  Location: [facility] ORS;  Service: Gynecology;  Laterality: Left;    Family Psychiatric History:   Family History:  Family  History  Problem Relation Age of Onset   Depression Father    Diabetes Father    Anxiety disorder Father    Alzheimer's disease Maternal Grandfather    Alzheimer's disease Maternal Grandmother    Prostate cancer Paternal Grandfather    Lung cancer Paternal Grandmother     Social History:  Social History   Socioeconomic History   Marital status: Single    Spouse name: Not on file   Number of children: 0   Years of education: Not on file   Highest education level: Master's degree (e.g., MA, MS, MEng, MEd, MSW, MBA)  Occupational History   Not on file  Tobacco Use   Smoking status: Never   Smokeless tobacco: Never  Vaping Use   Vaping Use: Never used  Substance and Sexual Activity   Alcohol use: Yes    Alcohol/week: 0.0 - 1.0 standard drinks    Comment: rare   Drug use: No   Sexual activity: Yes    Birth control/protection: I.U.D.    Comment: Mirena   Other Topics Concern   Not on file  Social History Narrative   Not on file   Social Determinants of Health   Financial Resource Strain: Not on file  Food Insecurity: Not on file  Transportation Needs: Not on file  Physical Activity: Not on file  Stress: Not on file  Social Connections: Not on file    Allergies:  Allergies  Allergen Reactions   Penicillins Hives    Has patient had a PCN reaction causing immediate rash, facial/tongue/throat swelling, SOB or lightheadedness with hypotension: yes Has patient had a PCN reaction causing severe rash involving mucus membranes or skin necrosis: no Has patient had a PCN reaction that required hospitalization: yes Has patient had a PCN reaction occurring within the last 10 years: no If all of the above answers are "NO", then may proceed with Cephalosporin use.    Azithromycin    Other     Narcotics- Burning in the stomach (whether PO or IV)   Strawberry (Diagnostic) Swelling    AND WALNUTS-TONGUE SWELLING    Metabolic Disorder Labs: Lab Results  Component Value  Date   HGBA1C 5.0 11/02/2019   No results found for: PROLACTIN Lab Results  Component Value Date   CHOL 242 (H) 04/03/2021   TRIG 153 (H) 04/03/2021   HDL 67 04/03/2021   CHOLHDL 3 03/20/2016   VLDL 12.2 03/20/2016   LDLCALC 148 (H) 04/03/2021   LDLCALC 95 08/16/2020   Lab Results  Component Value Date   TSH 2.100 04/03/2021   TSH 3.997 11/14/2020    Therapeutic Level Labs: No results found for: LITHIUM No results found for: VALPROATE No components found for:  CBMZ  Current Medications: Current Outpatient Medications  Medication Sig Dispense Refill   Azelaic Acid (FINACEA) 15 % FOAM Apply to face 1-2 times a day for rosacea. (Patient not taking: No sig reported) 50 g 5   clobetasol cream (TEMOVATE) 0.05 % Apply to affected areas rash 1-2 times a day as directed. Avoid face, groin, underarms. (Patient not taking: No sig reported) 60 g 0   cloNIDine (CATAPRES) 0.1 MG tablet Take 0.1 mg by mouth 2 (two) times daily.     Cyanocobalamin (B-12 COMPLIANCE INJECTION) 1000 MCG/ML KIT Inject 1,000 mcg as directed every 30 (thirty) days. 1 kit 3   EPINEPHrine 0.3 mg/0.3 mL IJ SOAJ injection SMARTSIG:0.3 Milliliter(s) IM Once PRN     gabapentin (NEURONTIN) 100 MG capsule Take 1 capsule (100 mg total) by mouth at bedtime. 90 capsule 1   hydrOXYzine (ATARAX/VISTARIL) 25 MG tablet Take 25 mg by mouth 3 (three) times daily.     levonorgestrel (MIRENA) 20 MCG/24HR IUD 1 each by Intrauterine route once.     levothyroxine (SYNTHROID) 125 MCG tablet Take 1 tablet (125 mcg total) by mouth daily. 90 tablet 3   Multiple Vitamin (MULTIVITAMIN) tablet Take 1 tablet by mouth daily.     nystatin-triamcinolone ointment (MYCOLOG) Apply 1 application topically 2 (two) times daily. 30 g 0   prazosin (MINIPRESS) 1 MG capsule Take 1 capsule (1 mg total) by mouth at bedtime. (Patient not taking: Reported on 04/03/2021) 30 capsule 0   sertraline (ZOLOFT) 100 MG tablet Take 2 tablets (200 mg total) by mouth at  bedtime. (Patient taking differently: Take 250 mg by mouth at bedtime.) 180 tablet 1   Vitamin D, Ergocalciferol, (DRISDOL) 1.25 MG (50000 UNIT) CAPS capsule Take 1 capsule (50,000 Units total) by mouth every 7 (seven) days. 12 capsule 1   No current facility-administered medications for this visit.     Musculoskeletal: Strength & Muscle Tone: within normal limits Gait & Station: normal Patient leans: N/A  Psychiatric Specialty Exam: Review of Systems  There were no vitals taken for this visit.There is no height or weight on file to calculate BMI.  General Appearance: Casual  Eye Contact:  Good  Speech:  Clear and Coherent  Volume:  Normal  Mood:  Anxious and Depressed  Affect:  Congruent  Thought Process:  Coherent  Orientation:  Full (Time, Place, and Person)  Thought Content: Logical   Suicidal Thoughts:  Yes.  with intent/plan  Homicidal Thoughts:  No  Memory:  Immediate;   Fair Recent;   Fair  Judgement:  Good  Insight:  Fair  Psychomotor Activity:  Normal  Concentration:  Concentration: Good  Recall:  Good  Fund of Knowledge: Good  Language: Good  Akathisia:  No  Handed:  Right  AIMS (if indicated): not done  Assets:  Communication Skills Physical Health Resilience Social Support  ADL's:  Intact  Cognition: WNL  Sleep:  Fair   Screenings: GAD-7    Flowsheet Row Office Visit from 04/03/2021 in [place] Office Visit from 11/11/2020 in [facility] Office Visit from 08/16/2020 in [facility] Office Visit from 02/01/2020 in [place] Visit from 12/11/2019 in [place]  Total GAD-7 Score _0 PHQ2-9    [place] Visit from 04/03/2021 in [place] from 03/25/2021 in [place] Office Visit from 11/11/2020 in [place] Visit from 08/16/2020 in [place] Visit from  02/01/2020 in [place]  PHQ-2 Total Score 6 6 0 4 4  PHQ-9 Total Score 26 26 0 17 23      Flowsheet Row Counselor from 03/25/2021 in [place]        Assessment and Plan:  Follow-up with assessment at the local emergency department/urgent care Patient mother was receptive to plan.    Derrill [place], NP 04/09/2021, 10:38 AM

## 2021-04-10 ENCOUNTER — Other Ambulatory Visit: Payer: Self-pay

## 2021-04-10 ENCOUNTER — Encounter (HOSPITAL_COMMUNITY): Payer: Self-pay

## 2021-04-10 ENCOUNTER — Other Ambulatory Visit (HOSPITAL_COMMUNITY): Payer: BC Managed Care – PPO | Admitting: Licensed Clinical Social Worker

## 2021-04-10 ENCOUNTER — Other Ambulatory Visit (HOSPITAL_COMMUNITY): Payer: BC Managed Care – PPO | Admitting: Occupational Therapy

## 2021-04-10 DIAGNOSIS — F4310 Post-traumatic stress disorder, unspecified: Secondary | ICD-10-CM

## 2021-04-10 DIAGNOSIS — R41844 Frontal lobe and executive function deficit: Secondary | ICD-10-CM

## 2021-04-10 DIAGNOSIS — R4589 Other symptoms and signs involving emotional state: Secondary | ICD-10-CM

## 2021-04-10 DIAGNOSIS — F332 Major depressive disorder, recurrent severe without psychotic features: Secondary | ICD-10-CM

## 2021-04-10 NOTE — Therapy (Signed)
[facility] PARTIAL HOSPITALIZATION PROGRAM [address] [place], [place], [address] Phone: [phone]   Fax:  [phone] Virtual Visit via Video Note  I connected with Sharon Gibson on 04/10/21 at  11:00 AM EDT by a video enabled telemedicine application and verified that I am speaking with the correct person using two identifiers.  Location: Patient: Patient Home Provider: Clinic Office   I discussed the limitations of evaluation and management by telemedicine and the availability of in person appointments. The patient expressed understanding and agreed to proceed.   I discussed the assessment and treatment plan with the patient. The patient was provided an opportunity to ask questions and all were answered. The patient agreed with the plan and demonstrated an understanding of the instructions.   The patient was advised to call back or seek an in-person evaluation if the symptoms worsen or if the condition fails to improve as anticipated.  I provided 60 minutes of non-face-to-face time during this encounter.   Donne Hazel, OT   Occupational Therapy Treatment  Patient Details  Name: Sharon Gibson MRN: [id number] Date of Birth: 1981/05/31 Referring Provider (OT): Hillery Jacks   Encounter Date: 04/10/2021   OT End of Session - 04/10/21 1323     Visit Number 12    Number of Visits 20    Date for OT Re-Evaluation 04/18/21    Authorization Type BCBS    OT Start Time 1115    OT Stop Time 1215    OT Time Calculation (min) 60 min    Activity Tolerance Patient tolerated treatment well    Behavior During Therapy WFL for tasks assessed/performed             Past Medical History:  Diagnosis Date   Anemia    Anxiety    Chicken pox    Depression    Eating disorder    Has had residential treatment and hospitalization previously.   Heart murmur    as a child   History of anemia    RECIEVES IRON INFUSIONS YEARLY   History of  pulmonary embolus (PE) 2002   bilaterally-followed by [facility] hematology-w/u negative per pt   Hypothyroidism    no meds currently   PTSD (post-traumatic stress disorder)     Past Surgical History:  Procedure Laterality Date   CHOLECYSTECTOMY  2003   EXCISION OF ENDOMETRIOMA     GASTRIC BYPASS  2003   LAPAROSCOPIC OVARIAN CYSTECTOMY Left 05/10/2018   Procedure: LAPAROSCOPIC OVARIAN CYSTECTOMY;  Surgeon: Natale Milch, MD;  Location: [facility] ORS;  Service: Gynecology;  Laterality: Left;    There were no vitals filed for this visit.   Subjective Assessment - 04/10/21 1322     Currently in Pain? No/denies             OT Education - 04/10/21 1322     Education Details Educated on fight-or-flight response and use of relaxation strategies including deep breathing, guided imagery, and PMR    Person(s) Educated Patient    Methods Explanation;Handout    Comprehension Verbalized understanding              OT Short Term Goals - 03/24/21 1344       OT SHORT TERM GOAL #1   Status On-going      OT SHORT TERM GOAL #2   Status On-going      OT SHORT TERM GOAL #3   Status On-going  Group Session:  S: "I love guided imagery, and one of my favorite meditations is the 'f it' meditation."  O:  Group began with a warm-up activity and group members were encouraged to share and identify ways in which they have practiced relaxation in the past, including any specific strategies or hobbies. Today's discussion focused on the topic of RELAXATION and patients reviewed and engaged in a variety of relaxation strategies techniques including deep breathing, guided imagery/meditation, and progressive muscle relaxation. After each strategy was reviewed, group members were invited to engage in an active practice of relaxation strategies identified. Review and background on the fight-or-flight response was also provided.   A: Sharon Gibson was active and independent in her participation  of discussion and activity, offering support to peers and sharing several relaxation strategies that currently work for her and have worked in the past. Most notably, pt shared that her go to relaxation technique is to listen to the "f*ck it, let it go" meditation and reports she is able to calm herself after listening to it. Pt appeared engaged and receptive to additional strategies reviewed and discussed.   P: Continue to attend PHP OT group sessions 5x week for 2 weeks to promote daily structure, social engagement, and opportunities to develop and utilize adaptive strategies to maximize functional performance in preparation for safe transition and integration back into school, work, and the community.  Plan - 04/10/21 1323     Occupational performance deficits (Please refer to evaluation for details): ADL's;IADL's;Rest and Sleep;Education;Work;Leisure;Social Participation    Body Structure / Function / Physical Skills ADL    Cognitive Skills Attention;Emotional;Energy/Drive;Learn;Memory;Perception;Problem Solve;Safety Awareness;Temperament/Personality;Thought;Understand    Psychosocial Skills Coping Strategies;Environmental  Adaptations;Habits;Interpersonal Interaction;Routines and Behaviors             Patient will benefit from skilled therapeutic intervention in order to improve the following deficits and impairments:   Body Structure / Function / Physical Skills: ADL Cognitive Skills: Attention, Emotional, Energy/Drive, Learn, Memory, Perception, Problem Solve, Safety Awareness, Temperament/Personality, Thought, Understand Psychosocial Skills: Coping Strategies, Environmental  Adaptations, Habits, Interpersonal Interaction, Routines and Behaviors   Visit Diagnosis: Difficulty coping  Frontal lobe and executive function deficit  Severe episode of recurrent major depressive disorder, without psychotic features (HCC)    Problem List Patient Active Problem List   Diagnosis Date  Noted   Cervical strain 11/28/2019   Endometriosis determined by laparoscopy 06/13/2018   Ovarian cyst 03/01/2018   Hx pulmonary embolism 02/03/2018   Rash 10/16/2016   Generalized anxiety disorder 10/12/2016   Right knee pain 09/02/2016   Severe recurrent major depression without psychotic features (HCC) 07/23/2016   Bulimia nervosa, in partial remission, mild 07/23/2016    Class: Chronic   Iron deficiency anemia 04/08/2016   B12 deficiency 04/08/2016   S/P gastric bypass 03/20/2016   History of eating disorder 03/20/2016   Insomnia 03/20/2016   PTSD (post-traumatic stress disorder) 03/20/2016   Hypothyroidism 03/20/2016   PCOS (polycystic ovarian syndrome) 12/14/2014    04/10/2021  Donne Hazel, MOT, OTR/L  04/10/2021, 1:23 PM  [facility] PARTIAL HOSPITALIZATION PROGRAM [address] [place], [place], [address] Phone: [phone]   Fax:  [phone]  Name: Sharon Gibson MRN: [id number] Date of Birth: 02-17-1981
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
## 2021-04-11 ENCOUNTER — Other Ambulatory Visit (HOSPITAL_COMMUNITY): Payer: BC Managed Care – PPO | Admitting: Licensed Clinical Social Worker

## 2021-04-11 ENCOUNTER — Other Ambulatory Visit (HOSPITAL_COMMUNITY): Payer: BC Managed Care – PPO | Admitting: Occupational Therapy

## 2021-04-11 ENCOUNTER — Encounter (HOSPITAL_COMMUNITY): Payer: Self-pay

## 2021-04-11 DIAGNOSIS — F4310 Post-traumatic stress disorder, unspecified: Secondary | ICD-10-CM

## 2021-04-11 DIAGNOSIS — F411 Generalized anxiety disorder: Secondary | ICD-10-CM

## 2021-04-11 DIAGNOSIS — F332 Major depressive disorder, recurrent severe without psychotic features: Secondary | ICD-10-CM | POA: Diagnosis not present

## 2021-04-11 DIAGNOSIS — R4589 Other symptoms and signs involving emotional state: Secondary | ICD-10-CM

## 2021-04-11 DIAGNOSIS — R41844 Frontal lobe and executive function deficit: Secondary | ICD-10-CM

## 2021-04-11 NOTE — Therapy (Signed)
[facility] PARTIAL HOSPITALIZATION PROGRAM [address] [place], [place], [address] Phone: [phone]   Fax:  [phone] Virtual Visit via Video Note  I connected with Sharon Gibson on 04/11/21 at  11:00 AM EDT by a video enabled telemedicine application and verified that I am speaking with the correct person using two identifiers.  Location: Patient: Patient Home Provider: Clinic Office   I discussed the limitations of evaluation and management by telemedicine and the availability of in person appointments. The patient expressed understanding and agreed to proceed.  I discussed the assessment and treatment plan with the patient. The patient was provided an opportunity to ask questions and all were answered. The patient agreed with the plan and demonstrated an understanding of the instructions.   The patient was advised to call back or seek an in-person evaluation if the symptoms worsen or if the condition fails to improve as anticipated.  I provided 65 minutes of non-face-to-face time during this encounter.   Donne Hazel, OT   Occupational Therapy Treatment  Patient Details  Name: Sharon Gibson MRN: [id number] Date of Birth: May 24, 1981 Referring Provider (OT): Hillery Jacks   Encounter Date: 04/11/2021   OT End of Session - 04/11/21 1429     Visit Number 13    Number of Visits 20    Date for OT Re-Evaluation 04/18/21    Authorization Type BCBS    OT Start Time 1115    OT Stop Time 1220    OT Time Calculation (min) 65 min    Activity Tolerance Patient tolerated treatment well    Behavior During Therapy WFL for tasks assessed/performed             Past Medical History:  Diagnosis Date   Anemia    Anxiety    Chicken pox    Depression    Eating disorder    Has had residential treatment and hospitalization previously.   Heart murmur    as a child   History of anemia    RECIEVES IRON INFUSIONS YEARLY   History of  pulmonary embolus (PE) 2002   bilaterally-followed by [facility] hematology-w/u negative per pt   Hypothyroidism    no meds currently   PTSD (post-traumatic stress disorder)     Past Surgical History:  Procedure Laterality Date   CHOLECYSTECTOMY  2003   EXCISION OF ENDOMETRIOMA     GASTRIC BYPASS  2003   LAPAROSCOPIC OVARIAN CYSTECTOMY Left 05/10/2018   Procedure: LAPAROSCOPIC OVARIAN CYSTECTOMY;  Surgeon: Natale Milch, MD;  Location: [facility] ORS;  Service: Gynecology;  Laterality: Left;    There were no vitals filed for this visit.   Subjective Assessment - 04/11/21 1429     Currently in Pain? No/denies             OT Education - 04/11/21 1429     Education Details Educated on brain fitness and healthy distractions as use for positive coping    Person(s) Educated Patient    Methods Explanation;Handout    Comprehension Verbalized understanding              OT Short Term Goals - 03/24/21 1344       OT SHORT TERM GOAL #1   Status On-going      OT SHORT TERM GOAL #2   Status On-going      OT SHORT TERM GOAL #3   Status On-going           Group Session:  S: "I  used to sing, so learning to read music was brain fitness"  O: Group encouraged increased social engagement and participation through discussion/activity focused on brain fitness. Patients were provided education on various brain fitness activities/strategies, with explanation provided on the qualifying factors including: one, that is has to be challenging/hard and two, it has to be something that you do not do every day. Patients engaged actively during group session in various brain fitness activities to increase attention, concentration, and problem-solving skills. Discussion followed with a focus on identifying the benefits of brain fitness activities as use for adaptive coping strategies and distraction.     A: Tim was active in their participation of group discussion and activity, while  present, and shared benefit of activities and use of brain fitness. Pt was an active participant and was observed smiling, laughing and engaging with peers. Pt validated and expressed that activities presented were distracting and noted benefit of improved mood.    P: Continue to attend PHP OT group sessions 5x week for 2 weeks to promote daily structure, social engagement, and opportunities to develop and utilize adaptive strategies to maximize functional performance in preparation for safe transition and integration back into school, work, and the community. Plan to address topic of goal-setting in next OT group session.  Plan - 04/11/21 1430     Occupational performance deficits (Please refer to evaluation for details): ADL's;IADL's;Rest and Sleep;Education;Work;Leisure;Social Participation    Body Structure / Function / Physical Skills ADL    Cognitive Skills Attention;Emotional;Energy/Drive;Learn;Memory;Perception;Problem Solve;Safety Awareness;Temperament/Personality;Thought;Understand    Psychosocial Skills Coping Strategies;Environmental  Adaptations;Habits;Interpersonal Interaction;Routines and Behaviors             Patient will benefit from skilled therapeutic intervention in order to improve the following deficits and impairments:   Body Structure / Function / Physical Skills: ADL Cognitive Skills: Attention, Emotional, Energy/Drive, Learn, Memory, Perception, Problem Solve, Safety Awareness, Temperament/Personality, Thought, Understand Psychosocial Skills: Coping Strategies, Environmental  Adaptations, Habits, Interpersonal Interaction, Routines and Behaviors   Visit Diagnosis: Difficulty coping  Frontal lobe and executive function deficit  Severe episode of recurrent major depressive disorder, without psychotic features (HCC)    Problem List Patient Active Problem List   Diagnosis Date Noted   Cervical strain 11/28/2019   Endometriosis determined by laparoscopy  06/13/2018   Ovarian cyst 03/01/2018   Hx pulmonary embolism 02/03/2018   Rash 10/16/2016   Generalized anxiety disorder 10/12/2016   Right knee pain 09/02/2016   Severe recurrent major depression without psychotic features (HCC) 07/23/2016   Bulimia nervosa, in partial remission, mild 07/23/2016    Class: Chronic   Iron deficiency anemia 04/08/2016   B12 deficiency 04/08/2016   S/P gastric bypass 03/20/2016   History of eating disorder 03/20/2016   Insomnia 03/20/2016   PTSD (post-traumatic stress disorder) 03/20/2016   Hypothyroidism 03/20/2016   PCOS (polycystic ovarian syndrome) 12/14/2014    04/11/2021  Donne Hazel, MOT, OTR/L  04/11/2021, 2:31 PM  [facility] PARTIAL HOSPITALIZATION PROGRAM [address] [place], [place], [address] Phone: [phone]   Fax:  [phone]  Name: Sharon Gibson MRN: [id number] Date of Birth: 12/31/1980

## 2021-04-15 ENCOUNTER — Other Ambulatory Visit: Payer: Self-pay

## 2021-04-15 ENCOUNTER — Encounter (HOSPITAL_COMMUNITY): Payer: Self-pay

## 2021-04-15 ENCOUNTER — Other Ambulatory Visit (HOSPITAL_COMMUNITY): Payer: BC Managed Care – PPO | Admitting: Licensed Clinical Social Worker

## 2021-04-15 ENCOUNTER — Other Ambulatory Visit (HOSPITAL_COMMUNITY): Payer: BC Managed Care – PPO | Admitting: Occupational Therapy

## 2021-04-15 DIAGNOSIS — R4589 Other symptoms and signs involving emotional state: Secondary | ICD-10-CM

## 2021-04-15 DIAGNOSIS — F332 Major depressive disorder, recurrent severe without psychotic features: Secondary | ICD-10-CM | POA: Diagnosis not present

## 2021-04-15 DIAGNOSIS — F4310 Post-traumatic stress disorder, unspecified: Secondary | ICD-10-CM

## 2021-04-15 DIAGNOSIS — R41844 Frontal lobe and executive function deficit: Secondary | ICD-10-CM

## 2021-04-15 DIAGNOSIS — F411 Generalized anxiety disorder: Secondary | ICD-10-CM

## 2021-04-15 NOTE — Therapy (Signed)
[facility] PARTIAL HOSPITALIZATION PROGRAM [address] [place], [place], [address] Phone: [phone]   Fax:  [phone] Virtual Visit via Video Note  I connected with [name] on 04/15/21 at  11:00 AM EDT by a video enabled telemedicine application and verified that I am speaking with the correct person using two identifiers.  Location: Patient: Patient Home Provider: Clinic Office   I discussed the limitations of evaluation and management by telemedicine and the availability of in person appointments. The patient expressed understanding and agreed to proceed.   I discussed the assessment and treatment plan with the patient. The patient was provided an opportunity to ask questions and all were answered. The patient agreed with the plan and demonstrated an understanding of the instructions.   The patient was advised to call back or seek an in-person evaluation if the symptoms worsen or if the condition fails to improve as anticipated.  I provided 60 minutes of non-face-to-face time during this encounter.   Donne Hazel, OT   Occupational Therapy Treatment  Patient Details  Name: Sharon Gibson MRN: [id number] Date of Birth: 1981-03-01 Referring Provider (OT): Hillery Jacks   Encounter Date: 04/15/2021   OT End of Session - 04/15/21 1539     Visit Number 14    Number of Visits 20    Date for OT Re-Evaluation 04/18/21    Authorization Type BCBS    OT Start Time 1115    OT Stop Time 1215    OT Time Calculation (min) 60 min    Activity Tolerance Patient tolerated treatment well    Behavior During Therapy WFL for tasks assessed/performed             Past Medical History:  Diagnosis Date   Anemia    Anxiety    Chicken pox    Depression    Eating disorder    Has had residential treatment and hospitalization previously.   Heart murmur    as a child   History of anemia    RECIEVES IRON INFUSIONS YEARLY   History of  pulmonary embolus (PE) 2002   bilaterally-followed by [facility] hematology-w/u negative per pt   Hypothyroidism    no meds currently   PTSD (post-traumatic stress disorder)     Past Surgical History:  Procedure Laterality Date   CHOLECYSTECTOMY  2003   EXCISION OF ENDOMETRIOMA     GASTRIC BYPASS  2003   LAPAROSCOPIC OVARIAN CYSTECTOMY Left 05/10/2018   Procedure: LAPAROSCOPIC OVARIAN CYSTECTOMY;  Surgeon: Natale Milch, MD;  Location: [facility] ORS;  Service: Gynecology;  Laterality: Left;    There were no vitals filed for this visit.   Subjective Assessment - 04/15/21 1539     Currently in Pain? No/denies                                  OT Education - 04/15/21 1539     Education Details Educated on goal-setting strategies and goal-setting with use of the SMART goal framework    Person(s) Educated Patient    Methods Explanation;Handout    Comprehension Verbalized understanding              OT Short Term Goals - 03/24/21 1344       OT SHORT TERM GOAL #1   Status On-going      OT SHORT TERM GOAL #2   Status On-going      OT  SHORT TERM GOAL #3   Status On-going           Group Session:  S: "I always make goals that I want to get done tomorrow and then if I don't, I feel like I failed"  O: Today's group session encouraged engagement and participation through discussion focused on goal setting. Group members were introduced to goal-setting using the SMART Goal framework, identifying goals as Specific, Measureable, Acheivable, Relevant, and Time-Bound. Group members took time from group to create their own personal goal reflecting the SMART goal template and shared for review by peers and OT.     A: Darinda was active and independent in her participation of discussion and activity, sharing that she has experience with goal-setting in the past, however will often set her goals too high or make the time-bound piece un-attainable. During  session, group discussed the elements of the SMART goal framework and emphasized making goals attainable and relevant, to which pt appeared open and receptive too. 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 Actively engaged for duration.   P: Continue to attend PHP OT group sessions 5x week for 1 week to promote daily structure, social engagement, and opportunities to develop and utilize adaptive strategies to maximize functional performance in preparation for safe transition and integration back into school, work, and the community. Plan to address topic of self-esteem in next OT group session.  Plan - 04/15/21 1540     Occupational performance deficits (Please refer to evaluation for details): ADL's;IADL's;Rest and Sleep;Education;Work;Leisure;Social Participation    Body Structure / Function / Physical Skills ADL    Cognitive Skills Attention;Emotional;Energy/Drive;Learn;Memory;Perception;Problem Solve;Safety Awareness;Temperament/Personality;Thought;Understand    Psychosocial Skills Coping Strategies;Environmental  Adaptations;Habits;Interpersonal Interaction;Routines and Behaviors             Patient will benefit from skilled therapeutic intervention in order to improve the following deficits and impairments:   Body Structure / Function / Physical Skills: ADL Cognitive Skills: Attention, Emotional, Energy/Drive, Learn, Memory, Perception, Problem Solve, Safety Awareness, Temperament/Personality, Thought, Understand Psychosocial Skills: Coping Strategies, Environmental  Adaptations, Habits, Interpersonal Interaction, Routines and Behaviors   Visit Diagnosis: Difficulty coping  Frontal lobe and executive function deficit  Severe episode of recurrent major depressive disorder, without psychotic features (HCC)    Problem List Patient Active Problem List   Diagnosis Date Noted   Cervical strain 11/28/2019   Endometriosis determined by laparoscopy  06/13/2018   Ovarian cyst 03/01/2018   Hx pulmonary embolism 02/03/2018   Rash 10/16/2016   Generalized anxiety disorder 10/12/2016   Right knee pain 09/02/2016   Severe recurrent major depression without psychotic features (HCC) 07/23/2016   Bulimia nervosa, in partial remission, mild 07/23/2016    Class: Chronic   Iron deficiency anemia 04/08/2016   B12 deficiency 04/08/2016   S/P gastric bypass 03/20/2016   History of eating disorder 03/20/2016   Insomnia 03/20/2016   PTSD (post-traumatic stress disorder) 03/20/2016   Hypothyroidism 03/20/2016   PCOS (polycystic ovarian syndrome) 12/14/2014    04/15/2021  Donne Hazel, MOT, OTR/L 04/15/2021, 3:40 PM  [facility] PARTIAL HOSPITALIZATION PROGRAM [address] [place], [place], [address] Phone: [phone]   Fax:  [phone]  Name: Sharon Gibson MRN: [id number] Date of Birth: 02-20-1981

## 2021-04-15 NOTE — Progress Notes (Signed)
Spoke with patient via Webex video call, used 2 identifiers to correctly identify patient. States that she is doing better this week than she was last week. Her mother is staying with her at her home this week. She has passive suicidal thoughts daily but contracts for safety and has her parents staying with her. Thursday she has an appointment with a trauma specialist. 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 She has a goal of quitting her job and working with her own business. Driving is very stressful for her as well. On a scale 1-10 as 10 being worst she rates depression at 8 and anxiety at 10. Denies HI or AV hallucinations. No other issues or complaints.

## 2021-04-16 ENCOUNTER — Other Ambulatory Visit (HOSPITAL_COMMUNITY): Payer: BC Managed Care – PPO | Admitting: Licensed Clinical Social Worker

## 2021-04-16 ENCOUNTER — Other Ambulatory Visit: Payer: Self-pay

## 2021-04-16 ENCOUNTER — Encounter (HOSPITAL_COMMUNITY): Payer: Self-pay

## 2021-04-16 ENCOUNTER — Other Ambulatory Visit (HOSPITAL_COMMUNITY): Payer: BC Managed Care – PPO | Admitting: Occupational Therapy

## 2021-04-16 DIAGNOSIS — F4310 Post-traumatic stress disorder, unspecified: Secondary | ICD-10-CM

## 2021-04-16 DIAGNOSIS — R4589 Other symptoms and signs involving emotional state: Secondary | ICD-10-CM

## 2021-04-16 DIAGNOSIS — R41844 Frontal lobe and executive function deficit: Secondary | ICD-10-CM

## 2021-04-16 DIAGNOSIS — F332 Major depressive disorder, recurrent severe without psychotic features: Secondary | ICD-10-CM

## 2021-04-16 DIAGNOSIS — F411 Generalized anxiety disorder: Secondary | ICD-10-CM

## 2021-04-16 NOTE — Therapy (Signed)
[facility] PARTIAL HOSPITALIZATION PROGRAM [address] [place], [place], [address] Phone: [phone]   Fax:  [phone] Virtual Visit via Video Note  I connected with Sharon Gibson on 04/16/21 at  12:00 PM EDT by a video enabled telemedicine application and verified that I am speaking with the correct person using two identifiers.  Location: Patient: Patient Home Provider: Clinic Office   I discussed the limitations of evaluation and management by telemedicine and the availability of in person appointments. The patient expressed understanding and agreed to proceed.   I discussed the assessment and treatment plan with the patient. The patient was provided an opportunity to ask questions and all were answered. The patient agreed with the plan and demonstrated an understanding of the instructions.   The patient was advised to call back or seek an in-person evaluation if the symptoms worsen or if the condition fails to improve as anticipated.  I provided 50 minutes of non-face-to-face time during this encounter.   Sharon Gibson, OT   Occupational Therapy Treatment  Patient Details  Name: Sharon Gibson MRN: [id number] Date of Birth: Dec 18, 1980 Referring Provider (OT): Hillery Jacks   Encounter Date: 04/16/2021   OT End of Session - 04/16/21 1453     Visit Number 15    Number of Visits 20    Date for OT Re-Evaluation 04/18/21    Authorization Type BCBS    OT Start Time 1200    OT Stop Time 1250    OT Time Calculation (min) 50 min    Activity Tolerance Patient tolerated treatment well    Behavior During Therapy WFL for tasks assessed/performed             Past Medical History:  Diagnosis Date   Anemia    Anxiety    Chicken pox    Depression    Eating disorder    Has had residential treatment and hospitalization previously.   Heart murmur    as a child   History of anemia    RECIEVES IRON INFUSIONS YEARLY   History of  pulmonary embolus (PE) 2002   bilaterally-followed by [facility] hematology-w/u negative per pt   Hypothyroidism    no meds currently   PTSD (post-traumatic stress disorder)     Past Surgical History:  Procedure Laterality Date   CHOLECYSTECTOMY  2003   EXCISION OF ENDOMETRIOMA     GASTRIC BYPASS  2003   LAPAROSCOPIC OVARIAN CYSTECTOMY Left 05/10/2018   Procedure: LAPAROSCOPIC OVARIAN CYSTECTOMY;  Surgeon: Natale Milch, MD;  Location: [facility] ORS;  Service: Gynecology;  Laterality: Left;    There were no vitals filed for this visit.   Subjective Assessment - 04/16/21 1452     Currently in Pain? No/denies               OT Education - 04/16/21 1452     Education Details Continued to educate on goal-setting strategies and goal-setting with use of the SMART goal framework with active practice    Person(s) Educated Patient    Methods Explanation;Handout    Comprehension Verbalized understanding              OT Short Term Goals - 03/24/21 1344       OT SHORT TERM GOAL #1   Status On-going      OT SHORT TERM GOAL #2   Status On-going      OT SHORT TERM GOAL #3   Status On-going  Group Session:  S: "I found a job at [employer] that is also perfect for what I need and I think I might go for it."  O: Today's group session continued to encourage active engagement and participation through discussion focused on goal setting. Group members reflected on what was learned in previous session using the SMART goal framework and brought the goals they created to discussion. Discussion focused on patients sharing their goals created and adapting them to fit their values and the SMART goal framework.   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 She shared that she found a job listed at [employer] that she feels like really fits her needs and wants to apply. Pt actively brainstormed and created a goal around this fitting the SMART framework: I will apply and submit my application to the job position at [employer] by Friday at 9 AM.   P: Continue to attend PHP OT group sessions 5x week for 1 weeks to promote daily structure, social engagement, and opportunities to develop and utilize adaptive strategies to maximize functional performance in preparation for safe transition and integration back into school, work, and the community. Plan to address topic of self-esteem in next OT group session.   Plan - 04/16/21 1453     Occupational performance deficits (Please refer to evaluation for details): ADL's;IADL's;Rest and Sleep;Education;Work;Leisure;Social Participation    Body Structure / Function / Physical Skills ADL    Cognitive Skills Attention;Emotional;Energy/Drive;Learn;Memory;Perception;Problem Solve;Safety Awareness;Temperament/Personality;Thought;Understand    Psychosocial Skills Coping Strategies;Environmental  Adaptations;Habits;Interpersonal Interaction;Routines and Behaviors             Patient will benefit from skilled therapeutic intervention in order to improve the following deficits and impairments:   Body Structure / Function / Physical Skills: ADL Cognitive Skills: Attention, Emotional, Energy/Drive, Learn, Memory, Perception, Problem Solve, Safety Awareness, Temperament/Personality, Thought, Understand Psychosocial Skills: Coping Strategies, Environmental  Adaptations, Habits, Interpersonal Interaction, Routines and Behaviors   Visit Diagnosis: Difficulty coping  Frontal lobe and executive function deficit  Severe episode of recurrent major depressive disorder, without psychotic features (HCC)    Problem List Patient Active Problem List   Diagnosis Date Noted   Cervical strain 11/28/2019    Endometriosis determined by laparoscopy 06/13/2018   Ovarian cyst 03/01/2018   Hx pulmonary embolism 02/03/2018   Rash 10/16/2016   Generalized anxiety disorder 10/12/2016   Right knee pain 09/02/2016   Severe recurrent major depression without psychotic features (HCC) 07/23/2016   Bulimia nervosa, in partial remission, mild 07/23/2016    Class: Chronic   Iron deficiency anemia 04/08/2016   B12 deficiency 04/08/2016   S/P gastric bypass 03/20/2016   History of eating disorder 03/20/2016   Insomnia 03/20/2016   PTSD (post-traumatic stress disorder) 03/20/2016   Hypothyroidism 03/20/2016   PCOS (polycystic ovarian syndrome) 12/14/2014    04/16/2021  Sharon Gibson, MOT, OTR/L  04/16/2021, 2:53 PM  [facility] PARTIAL HOSPITALIZATION PROGRAM [address] [place], [place], [address] Phone: [phone]   Fax:  [phone]  Name: Sharon Gibson MRN: [id number] Date of Birth: 06-20-1981

## 2021-04-16 NOTE — Progress Notes (Signed)
Virtual Visit via Video Note  I connected with Sharon Gibson on 04/16/21 at  9:00 AM EDT by a video enabled telemedicine application and verified that I am speaking with the correct person using two identifiers.  Location: Patient: Home Provider: Office   I discussed the limitations of evaluation and management by telemedicine and the availability of in person appointments. The patient expressed understanding and agreed to proceed.   I discussed the assessment and treatment plan with the patient. The patient was provided an opportunity to ask questions and all were answered. The patient agreed with the plan and demonstrated an understanding of the instructions.   The patient was advised to call back or seek an in-person evaluation if the symptoms worsen or if the condition fails to improve as anticipated.  I provided 15  minutes of non-face-to-face time during this encounter.   Derrill Center, NP   [facility] MD/PA/NP OP Progress Note  04/16/2021 11:06 AM Sharon Gibson  MRN:  [id number]   Evaluation: Sharon Gibson was seen and evaluated via WebEx.  She presents with a brighter affect today.  Continues to endorse passive ideations however states she has been residing with her parents for the past couple of days.  She denies depression or depressive symptoms.  States her anxiety is a 10 out of 10 with 10 being the worst.  Reports her nightmares have subsided since taking Minipress.  She reported that she has continued to safety plan with patient regarding passive suicidal ideations.  Was reported patient has follow-up appointment with therapist biweekly.  Patient continues to need support and redirection.  Staff to continue to monitor for safety.  Support,encouragement and reassurance was provided.    Visit Diagnosis:    ICD-10-CM   1. Severe episode of recurrent major depressive disorder, without psychotic features ([place])  F33.2     2. PTSD (post-traumatic stress disorder)  F43.10      3. Generalized anxiety disorder  F41.1       Past Psychiatric History:   Past Medical History:  Past Medical History:  Diagnosis Date   Anemia    Anxiety    Chicken pox    Depression    Eating disorder    Has had residential treatment and hospitalization previously.   Heart murmur    as a child   History of anemia    RECIEVES IRON INFUSIONS YEARLY   History of pulmonary embolus (PE) 2002   bilaterally-followed by [facility]ology-w/u negative per pt   Hypothyroidism    no meds currently   PTSD (post-traumatic stress disorder)     Past Surgical History:  Procedure Laterality Date   CHOLECYSTECTOMY  2003   EXCISION OF ENDOMETRIOMA     GASTRIC BYPASS  2003   LAPAROSCOPIC OVARIAN CYSTECTOMY Left 05/10/2018   Procedure: LAPAROSCOPIC OVARIAN CYSTECTOMY;  Surgeon: Homero Fellers, MD;  Location: [facility] ORS;  Service: Gynecology;  Laterality: Left;    Family Psychiatric History:   Family History:  Family History  Problem Relation Age of Onset   Depression Father    Diabetes Father    Anxiety disorder Father    Alzheimer's disease Maternal Grandfather    Alzheimer's disease Maternal Grandmother    Prostate cancer Paternal Grandfather    Lung cancer Paternal Grandmother     Social History:  Social History   Socioeconomic History   Marital status: Single    Spouse name: Not on file   Number of children: 0   Years of education: Not on  file   Highest education level: Master's degree (e.g., MA, MS, MEng, MEd, MSW, MBA)  Occupational History   Not on file  Tobacco Use   Smoking status: Never   Smokeless tobacco: Never  Vaping Use   Vaping Use: Never used  Substance and Sexual Activity   Alcohol use: Yes    Alcohol/week: 0.0 - 1.0 standard drinks    Comment: rare   Drug use: No   Sexual activity: Yes    Birth control/protection: I.U.D.    Comment: Mirena   Other Topics Concern   Not on file  Social History Narrative   Not on file   Social Determinants  of Health   Financial Resource Strain: Not on file  Food Insecurity: Not on file  Transportation Needs: Not on file  Physical Activity: Not on file  Stress: Not on file  Social Connections: Not on file    Allergies:  Allergies  Allergen Reactions   Penicillins Hives    Has patient had a PCN reaction causing immediate rash, facial/tongue/throat swelling, SOB or lightheadedness with hypotension: yes Has patient had a PCN reaction causing severe rash involving mucus membranes or skin necrosis: no Has patient had a PCN reaction that required hospitalization: yes Has patient had a PCN reaction occurring within the last 10 years: no If all of the above answers are "NO", then may proceed with Cephalosporin use.    Azithromycin    Other     Narcotics- Burning in the stomach (whether PO or IV)   Strawberry (Diagnostic) Swelling    AND WALNUTS-TONGUE SWELLING    Metabolic Disorder Labs: Lab Results  Component Value Date   HGBA1C 5.0 11/02/2019   No results found for: PROLACTIN Lab Results  Component Value Date   CHOL 242 (H) 04/03/2021   TRIG 153 (H) 04/03/2021   HDL 67 04/03/2021   CHOLHDL 3 03/20/2016   VLDL 12.2 03/20/2016   LDLCALC 148 (H) 04/03/2021   LDLCALC 95 08/16/2020   Lab Results  Component Value Date   TSH 2.100 04/03/2021   TSH 3.997 11/14/2020    Therapeutic Level Labs: No results found for: LITHIUM No results found for: VALPROATE No components found for:  CBMZ  Current Medications: Current Outpatient Medications  Medication Sig Dispense Refill   Azelaic Acid (FINACEA) 15 % FOAM Apply to face 1-2 times a day for rosacea. (Patient not taking: No sig reported) 50 g 5   clobetasol cream (TEMOVATE) 0.05 % Apply to affected areas rash 1-2 times a day as directed. Avoid face, groin, underarms. (Patient not taking: No sig reported) 60 g 0   cloNIDine (CATAPRES) 0.1 MG tablet Take 0.1 mg by mouth 2 (two) times daily.     Cyanocobalamin (B-12 COMPLIANCE  INJECTION) 1000 MCG/ML KIT Inject 1,000 mcg as directed every 30 (thirty) days. 1 kit 3   EPINEPHrine 0.3 mg/0.3 mL IJ SOAJ injection SMARTSIG:0.3 Milliliter(s) IM Once PRN     gabapentin (NEURONTIN) 100 MG capsule Take 1 capsule (100 mg total) by mouth at bedtime. 90 capsule 1   hydrOXYzine (ATARAX/VISTARIL) 25 MG tablet Take 25 mg by mouth 3 (three) times daily.     levonorgestrel (MIRENA) 20 MCG/24HR IUD 1 each by Intrauterine route once.     levothyroxine (SYNTHROID) 125 MCG tablet Take 1 tablet (125 mcg total) by mouth daily. 90 tablet 3   Multiple Vitamin (MULTIVITAMIN) tablet Take 1 tablet by mouth daily. (Patient not taking: No sig reported)     nystatin-triamcinolone ointment (MYCOLOG)  Apply 1 application topically 2 (two) times daily. 30 g 0   prazosin (MINIPRESS) 1 MG capsule Take 1 capsule (1 mg total) by mouth at bedtime. 30 capsule 0   sertraline (ZOLOFT) 100 MG tablet Take 2 tablets (200 mg total) by mouth at bedtime. (Patient taking differently: Take 250 mg by mouth at bedtime.) 180 tablet 1   Vitamin D, Ergocalciferol, (DRISDOL) 1.25 MG (50000 UNIT) CAPS capsule Take 1 capsule (50,000 Units total) by mouth every 7 (seven) days. 12 capsule 1   No current facility-administered medications for this visit.     Musculoskeletal: Strength & Muscle Tone: within normal limits Gait & Station: normal Patient leans: N/A  Psychiatric Specialty Exam: Review of Systems  There were no vitals taken for this visit.There is no height or weight on file to calculate BMI.  General Appearance: Casual  Eye Contact:  Good  Speech:  Clear and Coherent  Volume:  Normal  Mood:  Anxious and Depressed  Affect:  Congruent  Thought Process:  Coherent  Orientation:  Full (Time, Place, and Person)  Thought Content: Logical   Suicidal Thoughts:  No passive ideations, no intent no plan currently.  Safety plan with parents.  Patient to continue group setting sessions.  Homicidal Thoughts:  No  Memory:   Immediate;   Good Recent;   Good  Judgement:  Fair  Insight:  Good  Psychomotor Activity:  Normal  Concentration:  Concentration: Good  Recall:  Good  Fund of Knowledge: Good  Language: Fair  Akathisia:  No  Handed:  Right  AIMS (if indicated): done  Assets:  Communication Skills Social Support  ADL's:  Intact  Cognition: WNL  Sleep:  Good   Screenings: GAD-7    Flowsheet Row Office Visit from 04/03/2021 in [place] Visit from 11/11/2020 in [place] Visit from 08/16/2020 in [facility] Office Visit from 02/01/2020 in [place] Visit from 12/11/2019 in [place]  Total GAD-7 Score '19 15 16 20 21      ' PHQ2-9    [place] Visit from 04/03/2021 in [place] from 03/25/2021 in [place] Office Visit from 11/11/2020 in [place] Visit from 08/16/2020 in [place] Visit from 02/01/2020 in [place]  PHQ-2 Total Score 6 6 0 4 4  PHQ-9 Total Score 26 26 0 17 23      Flowsheet Row Counselor from 03/25/2021 in [place] and Plan:  Patient to continue Partial Hospitalization Programming  Continue medication as directed   Treatment plan was reviewed and agreed upon by NP T. Bobby Rumpf and patient Sharon Gibson need for group services.    Derrill Center, NP 04/16/2021, 11:06 AM

## 2021-04-17 ENCOUNTER — Other Ambulatory Visit (HOSPITAL_COMMUNITY): Payer: BC Managed Care – PPO

## 2021-04-17 ENCOUNTER — Ambulatory Visit (HOSPITAL_COMMUNITY): Payer: BC Managed Care – PPO

## 2021-04-18 ENCOUNTER — Other Ambulatory Visit: Payer: Self-pay

## 2021-04-18 ENCOUNTER — Other Ambulatory Visit (HOSPITAL_COMMUNITY): Payer: BC Managed Care – PPO | Admitting: Occupational Therapy

## 2021-04-18 ENCOUNTER — Encounter (HOSPITAL_COMMUNITY): Payer: Self-pay

## 2021-04-18 ENCOUNTER — Other Ambulatory Visit (HOSPITAL_COMMUNITY): Payer: BC Managed Care – PPO | Admitting: Licensed Clinical Social Worker

## 2021-04-18 DIAGNOSIS — F332 Major depressive disorder, recurrent severe without psychotic features: Secondary | ICD-10-CM | POA: Diagnosis not present

## 2021-04-18 DIAGNOSIS — F4310 Post-traumatic stress disorder, unspecified: Secondary | ICD-10-CM

## 2021-04-18 DIAGNOSIS — R41844 Frontal lobe and executive function deficit: Secondary | ICD-10-CM

## 2021-04-18 DIAGNOSIS — R4589 Other symptoms and signs involving emotional state: Secondary | ICD-10-CM

## 2021-04-18 DIAGNOSIS — F411 Generalized anxiety disorder: Secondary | ICD-10-CM

## 2021-04-18 NOTE — Therapy (Signed)
[place], [place], [address] Phone: [phone]   Fax:  [phone] Virtual Visit via Video Note  I connected with [name] on 04/18/21 at  11:00 AM EDT by a video enabled telemedicine application and verified that I am speaking with the correct person using two identifiers.  Location: Patient: Patient Home Provider: Clinic Office   I discussed the limitations of evaluation and management by telemedicine and the availability of in person appointments. The patient expressed understanding and agreed to proceed.   I discussed the assessment and treatment plan with the patient. The patient was provided an opportunity to ask questions and all were answered. The patient agreed with the plan and demonstrated an understanding of the instructions.   The patient was advised to call back or seek an in-person evaluation if the symptoms worsen or if the condition fails to improve as anticipated.  I provided 65 minutes of non-face-to-face time during this encounter.   Ponciano Ort, OT   Occupational Therapy Treatment  Patient Details  Name: Sharon Gibson MRN: [id number] Date of Birth: 09/22/1981 Referring Provider (OT): Ricky Ala   Encounter Date: 04/18/2021   OT End of Session - 04/18/21 1430     Visit Number 16    Number of Visits 20    Date for OT Re-Evaluation 04/18/21    Authorization Type BC[employer]    OT Start Time 1110    OT Stop Time 1215    OT Time Calculation (min) 65 min    Activity Tolerance Patient tolerated treatment well    Behavior During Therapy WFL for tasks assessed/performed             Past Medical History:  Diagnosis Date   Anemia    Anxiety    Chicken pox    Depression    Eating disorder    Has had residential treatment and hospitalization previously.   Heart murmur    as a child   History of anemia    RECIEVES IRON INFUSIONS YEARLY   History of  pulmonary embolus (PE) 2002   bilaterally-followed by [facility] hematology-w/u negative per pt   Hypothyroidism    no meds currently   PTSD (post-traumatic stress disorder)     Past Surgical History:  Procedure Laterality Date   CHOLECYSTECTOMY  2003   EXCISION OF ENDOMETRIOMA     GASTRIC BYPASS  2003   LAPAROSCOPIC OVARIAN CYSTECTOMY Left 05/10/2018   Procedure: LAPAROSCOPIC OVARIAN CYSTECTOMY;  Surgeon: Homero Fellers, MD;  Location: [facility] ORS;  Service: Gynecology;  Laterality: Left;    There were no vitals filed for this visit.   Subjective Assessment - 04/18/21 1429     Currently in Pain? No/denies             OT Education - 04/18/21 1429     Education Details Educated on personal strengths/exploration and brainstormed ways in which we could utilize our strengths as a strategy to manage our mental health, in regards to personal fulfillment, professional life, and within our relationships.    Person(s) Educated Patient    Methods Explanation;Handout    Comprehension Verbalized understanding              OT Short Term Goals - 04/18/21 1430       OT SHORT TERM GOAL #1   Title Pt will actively engage in OT group sessions throughout duration of PHP programming, in order to promote daily structure, social engagement, and  opportunities to develop and utilize adaptive strategies to maximize functional performance in preparation for safe transition and integration back into school, work, and the community.    Status Achieved      OT SHORT TERM GOAL #2   Title Pt will identify a minimum of two self-soothing relaxation strategies she can utilize in the moment, to safely manage increased anxiety/PTSD flashbacks, without engaging in self-harm behaviors,  in preparation for safe community reintegration.    Status Achieved      OT SHORT TERM GOAL #3   Title Pt will identify and re-engage in 2-3 ADL/iADL routines, as it relates to re-engagement in self-care activities, in  order to promote re-establishment of daily routines in preparation for reintegration back into the community.    Status Partially Met           Group Session:  S: "I want to have forgiveness, but I also don't know if I will ever get there."  O: Today's group session focused on topic of strength and explored personal strengths an individual possesses. Group members were given a strengths exploration handout and identified from the list what strengths they currently have and why they are considered a strength. Discussion focused on identifying how we can use our strengths to manage our mental health, while also exploring use of our strengths for our personal fulfillment, professional life, and in relationships.    A: Sharon Gibson was active in her participation of discussion and activity, sharing that she did not realize how many strengths she currently possessed and shared them in group. Pt identified her strengths when it comes to relationships "social awareness, open-mindedness, honesty, kindness, empathy, fairness", strengths in her profession "discipline, assertiveness, love of learning, kindness, honesty, kindness, social awareness", and strengths in her personal fulfillment "love of learning,". Pt also shared the strength she would like to gain "forgiveness". Receptive to discussion and support provided.   P: Plan to discharge patient from [place] effective today 04/18/21 with plan to follow up and begin [place] 04/22/21   OCCUPATIONAL THERAPY DISCHARGE SUMMARY  Visits from Start of Care: 16  Current functional level related to goals / functional outcomes: Sharon Gibson has met 2/3 OT goals during her time with PHP and is ready to discharge. Pt is agreeable to this and will discharge from OT effective today 04/22/21 with plan to follow up and begin [place] on 04/22/21   Remaining deficits: See above   Education / Equipment: See above   Patient agrees to discharge. Patient goals were partially met.  Patient is being discharged due to meeting the stated rehab goals..     Plan - 04/18/21 1430     Occupational performance deficits (Please refer to evaluation for details): ADL's;IADL's;Rest and Sleep;Education;Work;Leisure;Social Participation    Body Structure / Function / Physical Skills ADL    Cognitive Skills Attention;Emotional;Energy/Drive;Learn;Memory;Perception;Problem Solve;Safety Awareness;Temperament/Personality;Thought;Understand    Psychosocial Skills Coping Strategies;Environmental  Adaptations;Habits;Interpersonal Interaction;Routines and Behaviors             Patient will benefit from skilled therapeutic intervention in order to improve the following deficits and impairments:   Body Structure / Function / Physical Skills: ADL Cognitive Skills: Attention, Emotional, Energy/Drive, Learn, Memory, Perception, Problem Solve, Safety Awareness, Temperament/Personality, Thought, Understand Psychosocial Skills: Coping Strategies, Environmental  Adaptations, Habits, Interpersonal Interaction, Routines and Behaviors   Visit Diagnosis: Difficulty coping  Frontal lobe and executive function deficit  Severe episode of recurrent major depressive disorder, without psychotic features [facility])    Problem List Patient Active Problem List  Diagnosis Date Noted   Cervical strain 11/28/2019   Endometriosis determined by laparoscopy 06/13/2018   Ovarian cyst 03/01/2018   Hx pulmonary embolism 02/03/2018   Rash 10/16/2016   Generalized anxiety disorder 10/12/2016   Right knee pain 09/02/2016   Severe recurrent major depression without psychotic features ([place]) 07/23/2016   Bulimia nervosa, in partial remission, mild 07/23/2016    Class: Chronic   Iron deficiency anemia 04/08/2016   B12 deficiency 04/08/2016   S/P gastric bypass 03/20/2016   History of eating disorder 03/20/2016   Insomnia 03/20/2016   PTSD (post-traumatic stress disorder) 03/20/2016   Hypothyroidism  03/20/2016   PCOS (polycystic ovarian syndrome) 12/14/2014    04/18/2021  Ponciano Ort, MOT, OTR/L  04/18/2021, 2:31 PM  [place], [place], [address] Phone: [phone]   Fax:  [phone]  Name: Sharon Gibson MRN: [id number] Date of Birth: 1981-05-22

## 2021-04-21 ENCOUNTER — Other Ambulatory Visit: Payer: Self-pay

## 2021-04-21 ENCOUNTER — Encounter (HOSPITAL_COMMUNITY): Payer: Self-pay | Admitting: Family

## 2021-04-21 ENCOUNTER — Other Ambulatory Visit (HOSPITAL_COMMUNITY): Payer: BC Managed Care – PPO | Admitting: Licensed Clinical Social Worker

## 2021-04-21 ENCOUNTER — Other Ambulatory Visit (HOSPITAL_COMMUNITY): Payer: BC Managed Care – PPO

## 2021-04-21 DIAGNOSIS — F332 Major depressive disorder, recurrent severe without psychotic features: Secondary | ICD-10-CM

## 2021-04-21 DIAGNOSIS — F411 Generalized anxiety disorder: Secondary | ICD-10-CM

## 2021-04-21 DIAGNOSIS — F4310 Post-traumatic stress disorder, unspecified: Secondary | ICD-10-CM

## 2021-04-21 NOTE — Progress Notes (Signed)
Virtual Visit via Video Note  I connected with Sharon Gibson on 04/21/21 at  9:00 AM EDT by a video enabled telemedicine application and verified that I am speaking with the correct person using two identifiers.  Location: Patient: Home Provider: Office   I discussed the limitations of evaluation and management by telemedicine and the availability of in person appointments. The patient expressed understanding and agreed to proceed.    I discussed the assessment and treatment plan with the patient. The patient was provided an opportunity to ask questions and all were answered. The patient agreed with the plan and demonstrated an understanding of the instructions.   The patient was advised to call back or seek an in-person evaluation if the symptoms worsen or if the condition fails to improve as anticipated.  I provided 15 minutes of non-face-to-face time during this encounter.   [name], NP   [facility] Partial Hospitalization Outpatient Program Discharge Summary  Sharon Gibson [id number]  Admission date: 03/21/2021 Discharge date: 04/21/2021  Reason for admission: Sharon Gibson is a 40 y.o. Caucasian female presents with reported depression, anxiety and PTD'S .  Stated she was recently discharged from inpatient in [place].  Reports she was at a [facility] when she had medication adjustments for her depression and anxiety.  States she is prescribed Zoloft 250 mg, gabapentin and recently discontinued from Klonopin to clonidine for anxiety.  Patient reports dental and multiple stressors.  Reports financial stressors states she is unhappy with the current employer.  States she often gets overwhelmed easily.    Progress in Program Toward Treatment Goals: Ongoing, patient attended and participated with daily group session with active and engaged participation.  Continues to struggle with passive suicidal ideations throughout partial hospitalization  programming.  Safety plan put in place.  Patient was offered assessment for possible inpatient admission however declined.  Was reported that patient will be attending therapy twice weekly and intensive outpatient programming 3 times a week..   Progress (rationale): Stepping down to Intensive Outpatient programming. (IOP)  Take all medications as prescribed. Keep all follow-up appointments as scheduled.  Do not consume alcohol or use illegal drugs while on prescription medications. Report any adverse effects from your medications to your primary care provider promptly.  In the event of recurrent symptoms or worsening symptoms, call 911, a crisis hotline, or go to the nearest emergency department for evaluation.    [name], NP 04/21/2021

## 2021-04-22 ENCOUNTER — Other Ambulatory Visit (HOSPITAL_COMMUNITY): Payer: BC Managed Care – PPO | Admitting: Psychiatry

## 2021-04-22 ENCOUNTER — Encounter (HOSPITAL_COMMUNITY): Payer: Self-pay | Admitting: Psychiatry

## 2021-04-22 ENCOUNTER — Other Ambulatory Visit: Payer: Self-pay

## 2021-04-22 ENCOUNTER — Ambulatory Visit (HOSPITAL_COMMUNITY): Payer: BC Managed Care – PPO

## 2021-04-22 DIAGNOSIS — F411 Generalized anxiety disorder: Secondary | ICD-10-CM

## 2021-04-22 DIAGNOSIS — F332 Major depressive disorder, recurrent severe without psychotic features: Secondary | ICD-10-CM

## 2021-04-22 DIAGNOSIS — F4310 Post-traumatic stress disorder, unspecified: Secondary | ICD-10-CM

## 2021-04-22 MED ORDER — HYDROXYZINE HCL 25 MG PO TABS: 25.0000 mg | ORAL_TABLET | Freq: Three times a day (TID) | ORAL | 1 refills | Status: DC

## 2021-04-22 MED ORDER — PRAZOSIN HCL 1 MG PO CAPS: 1.0000 mg | ORAL_CAPSULE | Freq: Every day | ORAL | 0 refills | Status: DC

## 2021-04-22 MED ORDER — GABAPENTIN 100 MG PO CAPS: 100.0000 mg | ORAL_CAPSULE | Freq: Every day | ORAL | 1 refills | Status: DC

## 2021-04-22 MED ORDER — SERTRALINE HCL 100 MG PO TABS: 250.0000 mg | ORAL_TABLET | Freq: Every day | ORAL | 1 refills | Status: DC
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
## 2021-04-22 NOTE — Progress Notes (Signed)
Virtual Visit via Video Note  I connected with Sharon Gibson on @TODAY @ at  9:00 AM EDT by a video enabled telemedicine application and verified that I am speaking with the correct person using two identifiers.  Location: Patient: at home Provider: at office   I discussed the limitations of evaluation and management by telemedicine and the availability of in person appointments. The patient expressed understanding and agreed to proceed.  I discussed the assessment and treatment plan with the patient. The patient was provided an opportunity to ask questions and all were answered. The patient agreed with the plan and demonstrated an understanding of the instructions.   The patient was advised to call back or seek an in-person evaluation if the symptoms worsen or if the condition fails to improve as anticipated.  I provided 20 minutes of non-face-to-face time during this encounter.   , M.Ed,CNA   Patient ID: Sharon [facility], female   DOB: December 31, 1980, 40 y.o.   MRN: [id number] As per previous CCA states:  Pt presents as PHP referral post-residential treatment at [facility] in [place]. Pt reports spending one month in residential treatment for PTSD, MDD, and GAD. Pt reports she gained insight and strategies for coping in the facility however does not feel as if she is stable and is very concerned about being out of a controlled environment. Pt reports she lives alone with her dog, however parents are within 15 min and are primary supports for her. Pt states she hates her job and feels stuck in her personal life. Pt reports history of trauma throughout her life which have gone untreated. Pt reports history of an easting disorder and having multiple residential and PHP admissions for the ED, last episode approximately 2017. Pt states she is managing her ED and feels the untreated underlying MH issues are most problematic for her. Pt states multiple Emergency Dept visists due to chest  pains that have been attributed to anxiety/panic. Pt denies AVH, problematic substance use, and HI. Pt reports daily passive SI and denies plan/intent.   Current Symptoms/Problems: Pt reports depressed mood, panic attacks, chest pains, constant worry, trauma flashbacks, hypervigilance, nightmares, negative outlook, ambivalence about life, passive SI.   Pt transitioned from PHP to MH-IOP today.  Attended PHP from 03-21-21 through 04-18-21.  Pt's mother is still staying with her at her home; d/t passive on and off SI (pt can contract for safety and has her parents staying with her).  On a scale of 1-10 (10 being the worst); pt rates her depression at a 8 and anxiety at a 10.  Denies HI or A/V hallucinations.  A:  Oriented pt.  Pt gave verbal consent for treatment, to release chart information to referred providers and to complete any forms if needed.  Pt also gave consent for attending group virtually d/t COVID social distancing restrictions.  Encouraged support groups.  Recommend DBT.  If pt doesn't have providers, cm will refer pt to outside providers.  R:  Pt receptive.  02-15-1994, M.Ed,CNA

## 2021-04-22 NOTE — Psych (Signed)
Virtual Visit via Video Note  I connected with Sharon Gibson on 03/24/21 at  9:00 AM EDT by a video enabled telemedicine application and verified that I am speaking with the correct person using two identifiers.  Location: Patient: patient home Provider: clinical home office   I discussed the limitations of evaluation and management by telemedicine and the availability of in person appointments. The patient expressed understanding and agreed to proceed.  I discussed the assessment and treatment plan with the patient. The patient was provided an opportunity to ask questions and all were answered. The patient agreed with the plan and demonstrated an understanding of the instructions.   The patient was advised to call back or seek an in-person evaluation if the symptoms worsen or if the condition fails to improve as anticipated.  Pt was provided 240 minutes of non-face-to-face time during this encounter.   Donia Guiles, LCSW    [facility] PHP THERAPIST PROGRESS NOTE  Sharon Gibson [id number]  Session Time: 9:00 - 10:00  Participation Level: Active  Behavioral Response: CasualAlertAnxious and Depressed  Type of Therapy: Group Therapy  Treatment Goals addressed: Coping  Interventions: CBT, DBT, Supportive, and Reframing  Summary: Clinician led check-in regarding current stressors and situation. Clinician utilized active listening and empathetic response and validated patient emotions. Clinician facilitated processing group on pertinent issues.   Therapist Response: Briana Farner is a 40 y.o. female who presents with trauma symptoms.  Patient arrived within time allowed and reports that she is feeling "really, really anxious" Patient rates her mood at a 1 on a scale of 1-10 with 10 being great. Pt reports feeling overwhelmed easily and struggling with te adjustment of coming home from residential treatment. Pt reports SI throughout the weekend and denies plan/intent. Pt  is tearful throughout session.  Pt able to process. Pt engaged in discussion.         Session Time: 10:00 - 11:00   Participation Level: Active   Behavioral Response: CasualAlertDepressed   Type of Therapy: Group Therapy   Treatment Goals addressed: Coping   Interventions: CBT, DBT, Supportive and Reframing   Summary: Cln led processing group for pt's current struggles. Group members shared stressors and provided support and feedback. Cln brought in topics of boundaries, healthy relationships, and unhealthy thought processes to inform discussion.      Therapist Response: Pt able to process and provide support to group.          Session Time: 11:00- 12:00   Participation Level: Active   Behavioral Response: CasualAlertDepressed   Type of Therapy: Group Therapy, OT   Treatment Goals addressed: Coping   Interventions: Psychosocial skills training, Supportive   Summary: Occupational Therapy group   Therapist Response: Patient engaged in group. See OT note.           Session Time: 12:00 -1:00   Participation Level: Active   Behavioral Response: CasualAlertDepressed   Type of Therapy: Group therapy   Treatment Goals addressed: Coping   Interventions: CBT; Solution focused; Supportive; Reframing   Summary: 12:00 - 12:50: Cln introduced topic of DBT distress tolerance skills. Cln provided context for distress tolerance skills and how to practice them. Cln introduced the ACCEPTS distraction skills and group discusses ways to utilize "A" activities.  12:50 -1:00 Clinician led check-out. Clinician assessed for immediate needs, medication compliance and efficacy, and safety concerns   Therapist Response: 12:50 - 1:00: Pt engaged in discussion and is able to state 2 ways to practice the "A" skill. 12:50 -  1:00: At check-out, patient rates her mood at a 5 on a scale of 1-10 with 10 being great. Pt reports afternoon plans of grocery shopping. Pt demonstrates some progress  as evidenced by participating in first group session. Patient reports fleeting SI without plan/intent at end of session and states she will reach out to crisis support if needed.      Suicidal/Homicidal: Yeswithout intent/plan  Plan: Pt will continue in PHP while working to decrease trauma symptoms, decrease SI, and increase ability to manage symptoms in a healthy manner.    Diagnosis: PTSD (post-traumatic stress disorder) [F43.10]    1. PTSD (post-traumatic stress disorder)   2. GAD (generalized anxiety disorder)       Donia Guiles, LCSW 04/22/2021

## 2021-04-23 ENCOUNTER — Telehealth (HOSPITAL_COMMUNITY): Payer: Self-pay | Admitting: Psychiatry

## 2021-04-23 ENCOUNTER — Ambulatory Visit (HOSPITAL_COMMUNITY): Payer: BC Managed Care – PPO

## 2021-04-23 ENCOUNTER — Ambulatory Visit (HOSPITAL_COMMUNITY): Payer: BC Managed Care – PPO | Admitting: Psychiatry

## 2021-04-24 ENCOUNTER — Other Ambulatory Visit (HOSPITAL_COMMUNITY): Payer: BC Managed Care – PPO | Admitting: Psychiatry

## 2021-04-24 ENCOUNTER — Other Ambulatory Visit: Payer: Self-pay

## 2021-04-24 ENCOUNTER — Encounter (HOSPITAL_COMMUNITY): Payer: Self-pay

## 2021-04-24 DIAGNOSIS — F4310 Post-traumatic stress disorder, unspecified: Secondary | ICD-10-CM

## 2021-04-24 DIAGNOSIS — F332 Major depressive disorder, recurrent severe without psychotic features: Secondary | ICD-10-CM

## 2021-04-24 NOTE — Progress Notes (Signed)
Virtual Visit via Video Note  I connected with [name] on 04/24/21 at  9:00 AM EDT by a video enabled telemedicine application and verified that I am speaking with the correct person using two identifiers.  At orientation to the IOP program, Case Manager discussed the limitations of evaluation and management by telemedicine and the availability of in person appointments. The patient expressed understanding and agreed to proceed with virtual visits throughout the duration of the program.   Location:  Patient: Patient Home Provider: Home Office   History of Present Illness: MDD and PTSD  Observations/Objective: Check In: Case Manager checked in with all participants to review discharge dates, insurance authorizations, work-related documents and needs from the treatment team regarding medications. Client stated needs and engaged in discussion. Case Manager introduced 2 new Clients to the group, with group members welcoming and starting the joining process.  Initial Therapeutic Activity: Counselor introduced [employer], [place] Chaplain to present information and discussion on Grief and Loss. Group members engaged in discussion, sharing how grief impacts them, what comforts them, what emotions are felt, labeling losses, etc. After guest speaker logged off, Counselor prompted group to spend 10-15 minutes journaling to process personal grief and loss situations. Counselor processed entries with group and client's identified areas for additional processing in individual therapy. Client noted grief related to trauma history.   Second Therapeutic Activity: Counselor facilitated a check-in with group members to assess mood and current functioning. Client shared details of their mental health management since our last session, including challenges and successes. Counselor engaged group in discussion, covering the following topics: mental health stigma in minority communities, surviving  suicide attempts/thoughts, progress in coping skill application, structuring time for self-care and employment concerns. Client presents with moderate depression and moderate anxiety. Client denied any current SI/HI/psychosis.  Check Out:  Counselor closed program by allowing time to celebrate a graduating group member. Counselor shared reflections on progress and allow space for group members to share well wishes and encouragements with the graduating client. Counselor prompted graduating client to share takeaways, reflect on progress and final thoughts for the group. Client endorsed safety plan to be followed to prevent safety issues.  Assessment and Plan: Clinician recommends that Client remain in IOP treatment to better manage mental health symptoms, stabilization and to address treatment plan goals. Clinician recommends adherence to crisis/safety plan, taking medications as prescribed, and following up with medical professionals if any issues arise.    Follow Up Instructions: Clinician will send Webex link for next session. The Client was advised to call back or seek an in-person evaluation if the symptoms worsen or if the condition fails to improve as anticipated.     I provided 180 minutes of non-face-to-face time during this encounter.     Hilbert Odor, LCSW

## 2021-04-25 ENCOUNTER — Ambulatory Visit (HOSPITAL_COMMUNITY): Payer: BC Managed Care – PPO | Admitting: Psychiatry

## 2021-04-28 ENCOUNTER — Other Ambulatory Visit: Payer: Self-pay

## 2021-04-28 ENCOUNTER — Other Ambulatory Visit (HOSPITAL_COMMUNITY): Payer: BC Managed Care – PPO | Attending: Psychiatry | Admitting: Psychiatry

## 2021-04-28 DIAGNOSIS — R45851 Suicidal ideations: Secondary | ICD-10-CM | POA: Insufficient documentation

## 2021-04-28 DIAGNOSIS — F332 Major depressive disorder, recurrent severe without psychotic features: Secondary | ICD-10-CM

## 2021-04-28 DIAGNOSIS — F4310 Post-traumatic stress disorder, unspecified: Secondary | ICD-10-CM

## 2021-04-28 DIAGNOSIS — F419 Anxiety disorder, unspecified: Secondary | ICD-10-CM | POA: Diagnosis not present

## 2021-04-28 NOTE — Psych (Signed)
Virtual Visit via Video Note  I connected with Sharon Gibson on 03/25/21 at  9:00 AM EDT by a video enabled telemedicine application and verified that I am speaking with the correct person using two identifiers.  Location: Patient: patient home Provider: clinical home office   I discussed the limitations of evaluation and management by telemedicine and the availability of in person appointments. The patient expressed understanding and agreed to proceed.  I discussed the assessment and treatment plan with the patient. The patient was provided an opportunity to ask questions and all were answered. The patient agreed with the plan and demonstrated an understanding of the instructions.   The patient was advised to call back or seek an in-person evaluation if the symptoms worsen or if the condition fails to improve as anticipated.  Pt was provided 240 minutes of non-face-to-face time during this encounter.   Sharon Guiles, LCSW    [facility] PHP THERAPIST PROGRESS NOTE  Zayden Hahne [id number]  Session Time: 9:00 - 10:00  Participation Level: Active  Behavioral Response: CasualAlertAnxious and Depressed  Type of Therapy: Group Therapy  Treatment Goals addressed: Coping  Interventions: CBT, DBT, Supportive, and Reframing  Summary: Clinician led check-in regarding current stressors and situation. Clinician utilized active listening and empathetic response and validated patient emotions. Clinician facilitated processing group on pertinent issues.   Therapist Response: Sharon Gibson is a 40 y.o. female who presents with trauma symptoms.  Patient arrived within time allowed and reports that she is feeling "bad" Patient rates her mood at a 1 on a scale of 1-10 with 10 being great. Pt reports her afternoon went well until the evening when she had an issue with a coworker and spiraled re: how people suck. Pt reports struggling with SI after this and through the night/morning.  Pt denies plan/intent. Pt able to process. Pt engaged in discussion.         Session Time: 10:00 - 11:00   Participation Level: Active   Behavioral Response: CasualAlertDepressed   Type of Therapy: Group Therapy   Treatment Goals addressed: Coping   Interventions: CBT, DBT, Supportive and Reframing   Summary: Cln led discussion on anxious behaviors. Group members shared patterns of anxiety they experience. Cln encouraged pt's to increase insight into the roots of their anxious behaviors to be able to address triggers.      Therapist Response: Pt engaged in discussion and reports increased insight into triggers.          Session Time: 11:00- 12:00   Participation Level: Active   Behavioral Response: CasualAlertDepressed   Type of Therapy: Group Therapy, OT   Treatment Goals addressed: Coping   Interventions: Psychosocial skills training, Supportive   Summary: Occupational Therapy group   Therapist Response: Patient engaged in group. See OT note.           Session Time: 12:00 -1:00   Participation Level: Active   Behavioral Response: CasualAlertDepressed   Type of Therapy: Group therapy   Treatment Goals addressed: Coping   Interventions: CBT; Solution focused; Supportive; Reframing   Summary: 12:00 - 12:50: Cln continued topic of DBT distress tolerance skills and the ACCEPTS distraction skill. Group reviewed C-C-E skills and discussed how they can practice them in their every day life.  12:50 -1:00 Clinician led check-out. Clinician assessed for immediate needs, medication compliance and efficacy, and safety concerns   Therapist Response: 12:50 - 1:00: Pt engaged in discussion and reports she is most likely to practice the "E" skill.  12:50 -  1:00: At check-out, patient rates her mood at a 2 on a scale of 1-10 with 10 being great. Pt reports afternoon plans of napping. Pt demonstrates some progress as evidenced by attempting coping skills. Patient reports  fleeting SI without plan/intent at end of session and states she will reach out to crisis support if needed.      Suicidal/Homicidal: Yeswithout intent/plan  Plan: Pt will continue in PHP while working to decrease trauma symptoms, decrease SI, and increase ability to manage symptoms in a healthy manner.    Diagnosis: PTSD (post-traumatic stress disorder) [F43.10]    1. PTSD (post-traumatic stress disorder)   2. GAD (generalized anxiety disorder)       Sharon Guiles, LCSW 04/28/2021

## 2021-04-29 ENCOUNTER — Other Ambulatory Visit: Payer: Self-pay

## 2021-04-29 ENCOUNTER — Encounter (HOSPITAL_COMMUNITY): Payer: Self-pay | Admitting: Psychiatry

## 2021-04-29 ENCOUNTER — Encounter (HOSPITAL_COMMUNITY): Payer: Self-pay

## 2021-04-29 ENCOUNTER — Other Ambulatory Visit (HOSPITAL_COMMUNITY): Payer: BC Managed Care – PPO | Admitting: Psychiatry

## 2021-04-29 DIAGNOSIS — F332 Major depressive disorder, recurrent severe without psychotic features: Secondary | ICD-10-CM | POA: Diagnosis not present

## 2021-04-29 DIAGNOSIS — F4310 Post-traumatic stress disorder, unspecified: Secondary | ICD-10-CM

## 2021-04-29 LAB — VITAMIN K1, SERUM: VITAMIN K1: 0.65 ng/mL (ref 0.10–2.20)

## 2021-04-29 NOTE — Progress Notes (Signed)
Virtual Visit via Video Note  I connected with Sharon Gibson on 04/29/21 at  9:00 AM EDT by a video enabled telemedicine application and verified that I am speaking with the correct person using two identifiers.  At orientation to the IOP program, Case Manager discussed the limitations of evaluation and management by telemedicine and the availability of in person appointments. The patient expressed understanding and agreed to proceed with virtual visits throughout the duration of the program.   Location:  Patient: Patient Home Provider: Home Office   History of Present Illness: MDD and PTSD  Observations/Objective: Check In: Case Manager checked in with all participants to review discharge dates, insurance authorizations, work-related documents and needs from the treatment team regarding medications. Client stated needs and engaged in discussion. Case Manager introduced a new group member, as group members welcoming and starting the joining process.   Initial Therapeutic Counselor facilitated a check-in with group members to assess mood and current functioning. Client shared details of their mental health management since our last session, including challenges and successes. Counselor engaged group in discussion, covering the following topics: strategies for caregiving, encouraging self-care, childhood trauma and affects into adulthood, pros and cons to family support, and assertively communicating with support system. Client presents with moderate depression and moderate anxiety. Client denied any current SI/HI/psychosis.   Second Therapeutic Activity: Counselor reviewed a variety of treatment modalities for individual therapy, including brain spotting, EMDR, DBT, ECT and TMS to address depression and trauma. Group members asked questions and shared personal experiences with treatments.   Check Out: Counselor encouraged group members to incorporate movement and self-care in their  afternoon. Client endorsed safety plan to be followed to prevent safety issues.  Assessment and Plan: Clinician recommends that Client remain in IOP treatment to better manage mental health symptoms, stabilization and to address treatment plan goals. Clinician recommends adherence to crisis/safety plan, taking medications as prescribed, and following up with medical professionals if any issues arise.    Follow Up Instructions: Clinician will send Webex link for next session. The Client was advised to call back or seek an in-person evaluation if the symptoms worsen or if the condition fails to improve as anticipated.     I provided 180 minutes of non-face-to-face time during this encounter.     Hilbert Odor, LCSW

## 2021-04-29 NOTE — Progress Notes (Signed)
Virtual Visit via Video Note  I connected with Sharon Gibson 04/28/21 at  9:00 AM EDT by a video enabled telemedicine application and verified that I am speaking with the correct person using two identifiers.  At orientation to the IOP program, Case Manager discussed the limitations of evaluation and management by telemedicine and the availability of in person appointments. The patient expressed understanding and agreed to proceed with virtual visits throughout the duration of the program.   Location:  Patient: Patient Home Provider: Home Office   History of Present Illness: MDD and PTSD  Observations/Objective: Check In: Case Manager checked in with all participants to review discharge dates, insurance authorizations, work-related documents and needs from the treatment team regarding medications. Client stated needs and engaged in discussion.  Initial Therapeutic Counselor facilitated a check-in with group members to assess mood and current functioning. Client shared details of their mental health management since our last session, including challenges and successes. Counselor engaged group in discussion, covering the following topics: boundary setting, advocating for self, healthy anger releases, self-care application, panic attacks, and trauma processing. Client presents with moderate depression and moderate anxiety. Client denied any current SI/HI/psychosis.   Second Therapeutic Activity: Counselor engaged group in identifying internal and external stressors in the workplace. Client engaged in conversation. Counselor shared psychoeducation on strategies for managing workplace stress through 2 videos created by a doctor medicine and a doctor in psychology. Counselor prompted group members to share takeaways and discuss application of concepts in a personalized manner to their unique work environments. Client shared several ways they plan to implement strategies.  Check Out:  Counselor  encouraged group members to make a plan for return to work and structuring personal time for homework. Client endorsed safety plan to be followed to prevent safety issues.  Assessment and Plan: Clinician recommends that Client remain in IOP treatment to better manage mental health symptoms, stabilization and to address treatment plan goals. Clinician recommends adherence to crisis/safety plan, taking medications as prescribed, and following up with medical professionals if any issues arise.    Follow Up Instructions: Clinician will send Webex link for next session. The Client was advised to call back or seek an in-person evaluation if the symptoms worsen or if the condition fails to improve as anticipated.     I provided 180 minutes of non-face-to-face time during this encounter.     Hilbert Odor, LCSW

## 2021-04-30 ENCOUNTER — Other Ambulatory Visit (HOSPITAL_COMMUNITY): Payer: BC Managed Care – PPO

## 2021-04-30 ENCOUNTER — Other Ambulatory Visit: Payer: Self-pay

## 2021-05-01 ENCOUNTER — Other Ambulatory Visit (HOSPITAL_COMMUNITY): Payer: BC Managed Care – PPO | Admitting: Psychiatry

## 2021-05-01 ENCOUNTER — Other Ambulatory Visit: Payer: Self-pay

## 2021-05-01 DIAGNOSIS — F332 Major depressive disorder, recurrent severe without psychotic features: Secondary | ICD-10-CM

## 2021-05-01 DIAGNOSIS — F4310 Post-traumatic stress disorder, unspecified: Secondary | ICD-10-CM

## 2021-05-02 ENCOUNTER — Other Ambulatory Visit: Payer: Self-pay

## 2021-05-02 ENCOUNTER — Encounter (HOSPITAL_COMMUNITY): Payer: Self-pay

## 2021-05-02 ENCOUNTER — Other Ambulatory Visit (HOSPITAL_COMMUNITY): Payer: BC Managed Care – PPO

## 2021-05-02 NOTE — Progress Notes (Signed)
Virtual Visit via Video Note  I connected with Sharon Gibson on 05/01/21 at  9:00 AM EDT by a video enabled telemedicine application and verified that I am speaking with the correct person using two identifiers.  At orientation to the IOP program, Case Manager discussed the limitations of evaluation and management by telemedicine and the availability of in person appointments. The patient expressed understanding and agreed to proceed with virtual visits throughout the duration of the program.   Location:  Patient: Patient Home Provider: Home Office   History of Present Illness: MDD and PTSD  Observations/Objective: Check In: Case Manager checked in with all participants to review discharge dates, insurance authorizations, work-related documents and needs from the treatment team regarding medications. Client stated needs and engaged in discussion.   Initial Therapeutic Activity: Counselor facilitated a check-in with group members to assess mood and current functioning. Client shared details of their mental health management since our last session, including challenges and successes. Counselor engaged group in discussion, covering the following topics: changing behavior patterns, addressing negative cognitions, self-sabotage, fight, flight or freeze response, specific treatments, history of self-harm, practical application of self-care, anger management, and impact of disordered eating. Client presents with moderate depression and moderate anxiety. Client denied any current SI/HI/psychosis.  Second Therapeutic Activity: Counselor shared psychoeducation about self-compassion practices, application, benefits. Counselor facilitated a Optician, dispensing, prompting Clients to engage in activity by identify what self-touch is most comforting for them, using research by Dr. Haze Rushing, leading researcher on self-compassion. Client and group members shared feedback on experience. Client  identified that many of the prompts triggered trauma reminders, however she identified the self-hug felt the most soothing.    Check Out: Counselor prompted group members to identify one self-care practice or productivity activity they would like to engage in today. Client plans to complete an application. Client endorsed safety plan to be followed to prevent safety issues.  Assessment and Plan: Clinician recommends that Client remain in IOP treatment to better manage mental health symptoms, stabilization and to address treatment plan goals. Clinician recommends adherence to crisis/safety plan, taking medications as prescribed, and following up with medical professionals if any issues arise.    Follow Up Instructions: Clinician will send Webex link for next session. The Client was advised to call back or seek an in-person evaluation if the symptoms worsen or if the condition fails to improve as anticipated.     I provided 180 minutes of non-face-to-face time during this encounter.     Hilbert Odor, LCSW

## 2021-05-05 ENCOUNTER — Other Ambulatory Visit: Payer: Self-pay

## 2021-05-05 ENCOUNTER — Other Ambulatory Visit (HOSPITAL_COMMUNITY): Payer: BC Managed Care – PPO | Admitting: Psychiatry

## 2021-05-05 ENCOUNTER — Encounter: Payer: Self-pay | Admitting: Family Medicine

## 2021-05-05 ENCOUNTER — Encounter (HOSPITAL_COMMUNITY): Payer: Self-pay

## 2021-05-05 ENCOUNTER — Ambulatory Visit: Payer: BC Managed Care – PPO | Admitting: Family Medicine

## 2021-05-05 VITALS — BP 108/68 | HR 74 | Temp 98.3°F

## 2021-05-05 DIAGNOSIS — F4310 Post-traumatic stress disorder, unspecified: Secondary | ICD-10-CM

## 2021-05-05 DIAGNOSIS — M79644 Pain in right finger(s): Secondary | ICD-10-CM | POA: Diagnosis not present

## 2021-05-05 DIAGNOSIS — F332 Major depressive disorder, recurrent severe without psychotic features: Secondary | ICD-10-CM

## 2021-05-05 MED ORDER — LEVOTHYROXINE SODIUM 125 MCG PO TABS: 125.0000 ug | ORAL_TABLET | Freq: Every day | ORAL | 3 refills | Status: DC

## 2021-05-05 MED ORDER — SERTRALINE HCL 100 MG PO TABS: 250.0000 mg | ORAL_TABLET | Freq: Every day | ORAL | 1 refills | Status: DC

## 2021-05-05 MED ORDER — PRAZOSIN HCL 1 MG PO CAPS: 1.0000 mg | ORAL_CAPSULE | Freq: Every day | ORAL | 1 refills | Status: DC

## 2021-05-05 MED ORDER — HYDROXYZINE HCL 25 MG PO TABS: 25.0000 mg | ORAL_TABLET | Freq: Three times a day (TID) | ORAL | 1 refills | Status: DC

## 2021-05-05 MED ORDER — GABAPENTIN 100 MG PO CAPS: 100.0000 mg | ORAL_CAPSULE | Freq: Every day | ORAL | 1 refills | Status: DC

## 2021-05-05 MED ORDER — DICLOFENAC SODIUM 1 % EX GEL: 4.0000 g | Freq: Four times a day (QID) | CUTANEOUS | 12 refills | Status: DC

## 2021-05-05 MED ORDER — CLONIDINE HCL 0.1 MG PO TABS: 0.1000 mg | ORAL_TABLET | Freq: Two times a day (BID) | ORAL | 1 refills | Status: DC

## 2021-05-05 NOTE — Assessment & Plan Note (Addendum)
Doing better. In therapy. Continue current regimen. Continue to monitor. Refills given today.

## 2021-05-05 NOTE — Progress Notes (Signed)
BP 108/68   Pulse 74   Temp 98.3 F (36.8 C) (Oral)   SpO2 98%    Subjective:    Patient ID: Sharon Gibson, female    DOB: 11-02-1980, 40 y.o.   MRN: [id number]  HPI: Sharon Gibson is a 40 y.o. female  Chief Complaint  Patient presents with   Depression    4 week f/up    DEPRESSION- has decided to quit her job due to panic attacks when she's driving. She is going to do another job which she can do from home. Finished partial, is in IOP. Has been doing trauma therapy at [facility]. Going 2x a week. Has been doing better.  Mood status: stable Satisfied with current treatment?: yes Symptom severity: severe  Duration of current treatment : months Side effects: no Medication compliance: excellent compliance Psychotherapy/counseling: yes current Previous psychiatric medications: sertraline, prazosin, hydroxyzine, clonidine Depressed mood: yes Anxious mood: yes Anhedonia: yes Significant weight loss or gain: no Insomnia: no  Fatigue: yes Feelings of worthlessness or guilt: yes Impaired concentration/indecisiveness: yes Suicidal ideations: yes Hopelessness: yes Crying spells: yes Depression screen [facility] 2/9 05/05/2021 04/03/2021 11/11/2020 08/16/2020 02/01/2020  Decreased Interest 3 3 0 2 2  Down, Depressed, Hopeless 3 3 0 2 2  PHQ - 2 Score 6 6 0 4 4  Altered sleeping 3 3 0 3 3  Tired, decreased energy 3 3 0 3 3  Change in appetite 1 3 0 3 3  Feeling bad or failure about yourself  3 3 0 2 3  Trouble concentrating 3 3 0 2 3  Moving slowly or fidgety/restless 0 2 0 0 3  Suicidal thoughts 3 3 0 0 1  PHQ-9 Score 22 26 0 17 23  Difficult doing work/chores Extremely dIfficult Extremely dIfficult - Somewhat difficult Very difficult  Some encounter information is confidential and restricted. Go to Review Flowsheets activity to see all data.  Some recent data might be hidden   HAND PAIN Duration: about a month Involved hand: R thumb Mechanism of injury:   overuse Location: radial side Onset: sudden Severity: moderate  Quality: sharp and aching Frequency: constant Radiation: into her wrist Aggravating factors: doing diamond dots Alleviating factors: rest Treatments attempted: rest  Relief with NSAIDs?: No NSAIDs Taken Weakness: no Numbness: no Redness: no Swelling:no Bruising: no Fevers: no  Relevant past medical, surgical, family and social history reviewed and updated as indicated. Interim medical history since our last visit reviewed. Allergies and medications reviewed and updated.  Review of Systems  Constitutional: Negative.   Respiratory: Negative.    Cardiovascular: Negative.   Gastrointestinal: Negative.   Musculoskeletal: Negative.   Neurological: Negative.   Psychiatric/Behavioral:  Positive for dysphoric mood and sleep disturbance. Negative for agitation, behavioral problems, confusion, decreased concentration, hallucinations, self-injury and suicidal ideas. The patient is nervous/anxious. The patient is not hyperactive.    Per HPI unless specifically indicated above     Objective:    BP 108/68   Pulse 74   Temp 98.3 F (36.8 C) (Oral)   SpO2 98%   Wt Readings from Last 3 Encounters:  11/07/20 288 lb 14.6 oz (131.1 kg)  05/20/20 280 lb (127 kg)  05/08/20 (!) 304 lb 3.8 oz (138 kg)    Physical Exam Vitals and nursing note reviewed.  Constitutional:      General: She is not in acute distress.    Appearance: Normal appearance. She is not ill-appearing, toxic-appearing or diaphoretic.  HENT:     Head:  Normocephalic and atraumatic.     Right Ear: External ear normal.     Left Ear: External ear normal.     Nose: Nose normal.     Mouth/Throat:     Mouth: Mucous membranes are moist.     Pharynx: Oropharynx is clear.  Eyes:     General: No scleral icterus.       Right eye: No discharge.        Left eye: No discharge.     Extraocular Movements: Extraocular movements intact.     Conjunctiva/sclera:  Conjunctivae normal.     Pupils: Pupils are equal, round, and reactive to light.  Cardiovascular:     Rate and Rhythm: Normal rate and regular rhythm.     Pulses: Normal pulses.     Heart sounds: Normal heart sounds. No murmur heard.   No friction rub. No gallop.  Pulmonary:     Effort: Pulmonary effort is normal. No respiratory distress.     Breath sounds: Normal breath sounds. No stridor. No wheezing, rhonchi or rales.  Chest:     Chest wall: No tenderness.  Musculoskeletal:        General: Normal range of motion.     Cervical back: Normal range of motion and neck supple.  Skin:    General: Skin is warm and dry.     Capillary Refill: Capillary refill takes less than 2 seconds.     Coloration: Skin is not jaundiced or pale.     Findings: No bruising, erythema, lesion or rash.  Neurological:     General: No focal deficit present.     Mental Status: She is alert and oriented to person, place, and time. Mental status is at baseline.  Psychiatric:        Mood and Affect: Mood normal.        Behavior: Behavior normal.        Thought Content: Thought content normal.        Judgment: Judgment normal.    Results for orders placed or performed in visit on 04/03/21  Urine Culture   Specimen: Urine   UR  Result Value Ref Range   Urine Culture, Routine Final report    Organism ID, Bacteria No growth   Microscopic Examination   Urine  Result Value Ref Range   WBC, UA 0-5 0 - 5 /hpf   RBC None seen 0 - 2 /hpf   Epithelial Cells (non renal) 0-10 0 - 10 /hpf   Bacteria, UA Few (A) None seen/Few  CBC with Differential/Platelet  Result Value Ref Range   WBC 10.1 3.4 - 10.8 x10E3/uL   RBC 5.13 3.77 - 5.28 x10E6/uL   Hemoglobin 14.6 11.1 - 15.9 g/dL   Hematocrit 44.0 34.0 - 46.6 %   MCV 86 79 - 97 fL   MCH 28.5 26.6 - 33.0 pg   MCHC 33.2 31.5 - 35.7 g/dL   RDW 12.9 11.7 - 15.4 %   Platelets 317 150 - 450 x10E3/uL   Neutrophils 64 Not Estab. %   Lymphs 24 Not Estab. %   Monocytes  7 Not Estab. %   Eos 4 Not Estab. %   Basos 1 Not Estab. %   Neutrophils Absolute 6.5 1.4 - 7.0 x10E3/uL   Lymphocytes Absolute 2.4 0.7 - 3.1 x10E3/uL   Monocytes Absolute 0.7 0.1 - 0.9 x10E3/uL   EOS (ABSOLUTE) 0.4 0.0 - 0.4 x10E3/uL   Basophils Absolute 0.1 0.0 - 0.2 x10E3/uL   Immature Granulocytes  0 Not Estab. %   Immature Grans (Abs) 0.0 0.0 - 0.1 x10E3/uL  Comprehensive metabolic panel  Result Value Ref Range   Glucose 93 65 - 99 mg/dL   BUN 23 6 - 24 mg/dL   Creatinine, Ser 0.83 0.57 - 1.00 mg/dL   eGFR 91 >59 mL/min/1.73   BUN/Creatinine Ratio 28 (H) 9 - 23   Sodium 139 134 - 144 mmol/L   Potassium 4.6 3.5 - 5.2 mmol/L   Chloride 103 96 - 106 mmol/L   CO2 20 20 - 29 mmol/L   Calcium 10.1 8.7 - 10.2 mg/dL   Total Protein 6.9 6.0 - 8.5 g/dL   Albumin 4.8 3.8 - 4.8 g/dL   Globulin, Total 2.1 1.5 - 4.5 g/dL   Albumin/Globulin Ratio 2.3 (H) 1.2 - 2.2   Bilirubin Total 0.2 0.0 - 1.2 mg/dL   Alkaline Phosphatase 97 44 - 121 IU/L   AST 16 0 - 40 IU/L   ALT 15 0 - 32 IU/L  Lipid Panel w/o Chol/HDL Ratio  Result Value Ref Range   Cholesterol, Total 242 (H) 100 - 199 mg/dL   Triglycerides 153 (H) 0 - 149 mg/dL   HDL 67 >39 mg/dL   VLDL Cholesterol Cal 27 5 - 40 mg/dL   LDL Chol Calc (NIH) 148 (H) 0 - 99 mg/dL  Urinalysis, Routine w reflex microscopic  Result Value Ref Range   Specific Gravity, UA 1.025 1.005 - 1.030   pH, UA 5.5 5.0 - 7.5   Color, UA Yellow Yellow   Appearance Ur Cloudy (A) Clear   Leukocytes,UA Trace (A) Negative   Protein,UA Negative Negative/Trace   Glucose, UA Negative Negative   Ketones, UA Negative Negative   RBC, UA Negative Negative   Bilirubin, UA Negative Negative   Urobilinogen, Ur 0.2 0.2 - 1.0 mg/dL   Nitrite, UA Negative Negative   Microscopic Examination See below:   TSH  Result Value Ref Range   TSH 2.100 0.450 - 4.500 uIU/mL  B12  Result Value Ref Range   Vitamin B-12 463 232 - 1,245 pg/mL  VITAMIN D 25 Hydroxy (Vit-D  Deficiency, Fractures)  Result Value Ref Range   Vit D, 25-Hydroxy 12.8 (L) 30.0 - 100.0 ng/mL  Vitamin K1, Serum  Result Value Ref Range   VITAMIN K1 0.65 0.10 - 2.20 ng/mL  Iron and TIBC  Result Value Ref Range   Total Iron Binding Capacity 283 250 - 450 ug/dL   UIBC 227 131 - 425 ug/dL   Iron 56 27 - 159 ug/dL   Iron Saturation 20 15 - 55 %  Ferritin  Result Value Ref Range   Ferritin 134 15 - 150 ng/mL      Assessment & Plan:   Problem List Items Addressed This Visit       Other   PTSD (post-traumatic stress disorder)    Doing better. In therapy. Continue current regimen. Continue to monitor. Refills given today.       Relevant Medications   sertraline (ZOLOFT) 100 MG tablet   hydrOXYzine (ATARAX/VISTARIL) 25 MG tablet   Severe recurrent major depression without psychotic features ([place]) - Primary    Doing better. In therapy. Continue current regimen. Continue to monitor. Refills given today.       Relevant Medications   sertraline (ZOLOFT) 100 MG tablet   hydrOXYzine (ATARAX/VISTARIL) 25 MG tablet   Other Visit Diagnoses     Pain of right thumb       Bluford Kaufmann.  Will treat with exercises, voltaren. Call if not getting better or getting worse.         Follow up plan: Return in about 6 weeks (around 06/16/2021).

## 2021-05-05 NOTE — Assessment & Plan Note (Signed)
Doing better. In therapy. Continue current regimen. Continue to monitor. Refills given today. 

## 2021-05-05 NOTE — Progress Notes (Signed)
Virtual Visit via Video Note  I connected with [name] on 05/05/21 at  4:00 PM EDT by a video enabled telemedicine application and verified that I am speaking with the correct person using two identifiers.  At orientation to the IOP program, Case Manager discussed the limitations of evaluation and management by telemedicine and the availability of in person appointments. The patient expressed understanding and agreed to proceed with virtual visits throughout the duration of the program.   Location:  Patient: Patient Home Provider: Home Office   History of Present Illness: MDD and PTSD  Observations/Objective: Check In: Case Manager checked in with all participants to review discharge dates, insurance authorizations, work-related documents and needs from the treatment team regarding medications. Client stated needs and engaged in discussion.   Initial Therapeutic Activity: Counselor facilitated a check-in with group members to assess mood and current functioning. Client shared details of their mental health management since our last session, including challenges and successes. Counselor engaged group in discussion, covering the following topics: personal cultures and how mental health is viewed, parental mental health influences, facing fears/intense emotions, life goals and managing expectations. Client presents with moderate depression and moderate anxiety. Client denied any current SI/HI/psychosis.  Second Therapeutic Activity: Counselor presented information and resources on forgiveness, radical forgiveness, self-forgiveness and healing from past/childhood wounds. Group members shared their experiences and beliefs on these subjects. Client discussed how information is applicable to their own mental health and treatment goals/outcomes. Counselor shared additional resources for self/homework via e-mail.   Check Out: Counselor prompted group members to identify one self-care practice  or productivity activity they would like to engage in today. Client plans to attend an appointment. Client endorsed safety plan to be followed to prevent safety issues.  Assessment and Plan: Clinician recommends that Client remain in IOP treatment to better manage mental health symptoms, stabilization and to address treatment plan goals. Clinician recommends adherence to crisis/safety plan, taking medications as prescribed, and following up with medical professionals if any issues arise.    Follow Up Instructions: Clinician will send Webex link for next session. The Client was advised to call back or seek an in-person evaluation if the symptoms worsen or if the condition fails to improve as anticipated.     I provided 180 minutes of non-face-to-face time during this encounter.     Hilbert Odor, LCSW

## 2021-05-06 ENCOUNTER — Other Ambulatory Visit (HOSPITAL_COMMUNITY): Payer: BC Managed Care – PPO | Admitting: Psychiatry

## 2021-05-06 DIAGNOSIS — F332 Major depressive disorder, recurrent severe without psychotic features: Secondary | ICD-10-CM

## 2021-05-06 DIAGNOSIS — F4310 Post-traumatic stress disorder, unspecified: Secondary | ICD-10-CM

## 2021-05-06 NOTE — Psych (Signed)
Virtual Visit via Video Note  I connected with Sharon Gibson on 03/28/21 at  9:00 AM EDT by a video enabled telemedicine application and verified that I am speaking with the correct person using two identifiers.  Location: Patient: patient home Provider: clinical home office   I discussed the limitations of evaluation and management by telemedicine and the availability of in person appointments. The patient expressed understanding and agreed to proceed.  I discussed the assessment and treatment plan with the patient. The patient was provided an opportunity to ask questions and all were answered. The patient agreed with the plan and demonstrated an understanding of the instructions.   The patient was advised to call back or seek an in-person evaluation if the symptoms worsen or if the condition fails to improve as anticipated.  Pt was provided 240 minutes of non-face-to-face time during this encounter.   Sharon Guiles, LCSW    [facility] PHP THERAPIST PROGRESS NOTE  Sharon Gibson [id number]  Session Time: 9:00 - 10:00  Participation Level: Active  Behavioral Response: CasualAlertAnxious and Depressed  Type of Therapy: Group Therapy  Treatment Goals addressed: Coping  Interventions: CBT, DBT, Supportive, and Reframing  Summary: Clinician led check-in regarding current stressors and situation. Clinician utilized active listening and empathetic response and validated patient emotions. Clinician facilitated processing group on pertinent issues.   Therapist Response: Sharon Gibson is a 40 y.o. female who presents with trauma symptoms.  Patient arrived within time allowed and reports that she is feeling "anxious" Patient rates her mood at a 2 on a scale of 1-10 with 10 being great. Pt reports experiencing chest pains this morning, which is a symptom of high anxiety for her. Pt states she feels like she is "vibrating with energy" and can't sit still. Pt states this is all  post-panic attack this morning fueled by feeling she is not being "productive enough." Pt states mom did come over yesterday and spent the day with her and the evening was "easier." Pt reports less frequent SI yesterday after group and continues to deny plan/intent. Pt able to process. Pt engaged in discussion.         Session Time: 10:00 - 11:00   Participation Level: Active   Behavioral Response: CasualAlertDepressed   Type of Therapy: Group Therapy   Treatment Goals addressed: Coping   Interventions: CBT, DBT, Supportive and Reframing   Summary: Cln led discussion on communication struggles. Group shared ways in which communication is difficult for them and what are typical barriers. Cln reminded pt's of assertiveness skills and "I" statements. Cln encouaged pt's to consider making notes for points they want to address in a discussion and/or script out what they want to say as a way to decrease anxiety and likeliness that they will get off topic.      Therapist Response: Pt engaged in discussion and reports willingness to apply communication strategies.           Session Time: 11:00- 12:00   Participation Level: Active   Behavioral Response: CasualAlertDepressed   Type of Therapy: Group Therapy   Treatment Goals addressed: Coping   Interventions: CBT, DBT, Supportive and Reframing   Summary: Cln led discussion on healthy aggression substitutes. Cln discussed the benefits to discharging energy and adrenaline when feeling "revved up" in emotion and the importance of balancing that discharge with safety and lack if negative consequences. Group brainstormed different ways to channel agression in a healthy way and shared ways that have worked for them in the  past.   Therapist Response: Pt engaged in discussion and identifies 3 options to try.          Session Time: 12:00 -1:00   Participation Level: Active   Behavioral Response: CasualAlertDepressed   Type of Therapy:  Group therapy   Treatment Goals addressed: Coping   Interventions: CBT; Solution focused; Supportive; Reframing   Summary: 12:00 - 12:50: Cln led discussion on ways to manage stressors and feelings over the weekend. Group members  brainstormed things to do over the weekend for multiple levels of energy, access, and moods. Cln reviewed crisis services should they be needed and provided pt's with the text crisis line, mobile crisis, national suicide hotline, [facility] 24/7 line, and information on [facility].    12:50 -1:00 Clinician led check-out. Clinician assessed for immediate needs, medication compliance and efficacy, and safety concerns   Therapist Response: 12:50 - 1:00: Pt engaged in discussion and is able to identify 3 ideas of what to do over the weekend to keep their mind engaged.  12:50 - 1:00: At check-out, patient rates her mood at a 4 on a scale of 1-10 with 10 being great. Pt reports afternoon plans of relaxing. Pt demonstrates some progress as evidenced by increased ability to manage SI successfully. Patient reports passive SI without plan/intent at end of session and states she will reach out to crisis support if needed.     Suicidal/Homicidal: Yeswithout intent/plan  Plan: Pt will continue in PHP while working to decrease trauma symptoms, decrease SI, and increase ability to manage symptoms in a healthy manner.    Diagnosis: PTSD (post-traumatic stress disorder) [F43.10]    1. PTSD (post-traumatic stress disorder)   2. Difficulty coping   3. Severe episode of recurrent major depressive disorder, without psychotic features (HCC)       Sharon Guiles, LCSW 05/06/2021

## 2021-05-06 NOTE — Psych (Signed)
Virtual Visit via Video Note  I connected with Sharon Gibson on 03/27/21 at  9:00 AM EDT by a video enabled telemedicine application and verified that I am speaking with the correct person using two identifiers.  Location: Patient: patient home Provider: clinical home office   I discussed the limitations of evaluation and management by telemedicine and the availability of in person appointments. The patient expressed understanding and agreed to proceed.  I discussed the assessment and treatment plan with the patient. The patient was provided an opportunity to ask questions and all were answered. The patient agreed with the plan and demonstrated an understanding of the instructions.   The patient was advised to call back or seek an in-person evaluation if the symptoms worsen or if the condition fails to improve as anticipated.  Pt was provided 270 minutes of non-face-to-face time during this encounter.   Lorin Glass, LCSW    [facility] PHP THERAPIST PROGRESS NOTE  Sharon Gibson [id number]  Session Time: 9:00 - 10:00  Participation Level: Active  Behavioral Response: CasualAlertAnxious and Depressed  Type of Therapy: Group Therapy  Treatment Goals addressed: Coping  Interventions: CBT, DBT, Supportive, and Reframing  Summary: Clinician led check-in regarding current stressors and situation. Clinician utilized active listening and empathetic response and validated patient emotions. Clinician facilitated processing group on pertinent issues.   Therapist Response: Sharon Gibson is a 40 y.o. female who presents with trauma symptoms.  Patient arrived within time allowed and reports that she is feeling "anxious" Patient rates her mood at a 4 on a scale of 1-10 with 10 being great. Pt reports her dentist appt went "okay" and she spent time with her dog and parents yesterday. Pt states her mom spent the night. Pt states disrupted sleep, but was able to go back to sleep and  had less nightmares. Pt reports less frequent SI yesterday and continues to deny plan/intent. Pt reports struggling today with hypervigilance due to things going well this morning and waiting for it to be messed up. Pt able to process. Pt engaged in discussion.         Session Time: 10:00 - 11:00   Participation Level: Active   Behavioral Response: CasualAlertDepressed   Type of Therapy: Group Therapy   Treatment Goals addressed: Coping   Interventions: CBT, DBT, Supportive and Reframing   Summary: Cln led discussion on "spiraling" or when our thoughts compound on one another to descend our feelings into worse and worse situations. Group members discussed the ways in which spiraling is difficult for them and when they are especially vulnerable. Cln offered DBT distraction skills as a way to halt the spiral once you recognize it.      Therapist Response: Pt engaged in discussion and reports spiraling is a major issue for her. Pt is able to increase awareness of spiraling throughout the discussion.         Session Time: 11:00- 12:00   Participation Level: Active   Behavioral Response: CasualAlertDepressed   Type of Therapy: Group Therapy, OT   Treatment Goals addressed: Coping   Interventions: Psychosocial skills training, Supportive   Summary: Occupational Therapy group   Therapist Response: Patient engaged in group. See OT note.           Session Time: 12:00 -1:00   Participation Level: Active   Behavioral Response: CasualAlertDepressed   Type of Therapy: Group therapy   Treatment Goals addressed: Coping   Interventions: CBT; Solution focused; Supportive; Reframing   Summary: 12:00 - 12:50:  Cln continued topic of DBT distress tolerance skills and the ACCEPTS distraction skill. Group reviewed P-T-S skills and discussed how they can practice them in their every day life.   12:50 -1:00 Clinician led check-out. Clinician assessed for immediate needs, medication  compliance and efficacy, and safety concerns   Therapist Response: 12:50 - 1:00: Pt engaged in discussion and reports she is most likely to practice the "T" skill.  12:50 - 1:00: At check-out, patient rates her mood at a 1/2 on a scale of 1-10 with 10 being great. Pt reports afternoon plans of spending time with her dog. Pt demonstrates some progress as evidenced by asking for what she needs. Patient reports passive SI without plan/intent at end of session and states she will reach out to crisis support if needed.   1:00 - 1:30: Individual therapy Cln met with pt individually to deescalate pt and safety plan. Pt states "this is my last chance to get better and what if I don't?" Cln discussed managing expectations and focusing on the now. Cln encouraged pt to bring focus back to today, rather than jumping ahead weeks or months ahead. Pt reports feeling more stable after discussion. Pt states she will ask mom to come over and stay with her so she is not alone.    Suicidal/Homicidal: Yeswithout intent/plan  Plan: Pt will continue in PHP while working to decrease trauma symptoms, decrease SI, and increase ability to manage symptoms in a healthy manner.    Diagnosis: PTSD (post-traumatic stress disorder) [F43.10]    1. PTSD (post-traumatic stress disorder)       Lorin Glass, LCSW 05/06/2021

## 2021-05-06 NOTE — Psych (Signed)
Virtual Visit via Video Note  I connected with Sharon Gibson on 03/26/21 at  9:00 AM EDT by a video enabled telemedicine application and verified that I am speaking with the correct person using two identifiers.  Location: Patient: patient home Provider: clinical home office   I discussed the limitations of evaluation and management by telemedicine and the availability of in person appointments. The patient expressed understanding and agreed to proceed.  I discussed the assessment and treatment plan with the patient. The patient was provided an opportunity to ask questions and all were answered. The patient agreed with the plan and demonstrated an understanding of the instructions.   The patient was advised to call back or seek an in-person evaluation if the symptoms worsen or if the condition fails to improve as anticipated.  Pt was provided 240 minutes of non-face-to-face time during this encounter.   Donia Guiles, LCSW    [facility] PHP THERAPIST PROGRESS NOTE  Avalynne Diver [id number]  Session Time: 9:00 - 10:00  Participation Level: Active  Behavioral Response: CasualAlertAnxious and Depressed  Type of Therapy: Group Therapy  Treatment Goals addressed: Coping  Interventions: CBT, DBT, Supportive, and Reframing  Summary: Clinician led check-in regarding current stressors and situation. Clinician utilized active listening and empathetic response and validated patient emotions. Clinician facilitated processing group on pertinent issues.   Therapist Response: Sharon Gibson is a 40 y.o. female who presents with trauma symptoms.  Patient arrived within time allowed and reports that she is feeling "better than recent days" Patient rates her mood at a 3 on a scale of 1-10 with 10 being great. Pt reports her afternoon was "fairly relaxing" and she napped and did diamond dots. Pt reports continued struggles with not "doing anything" during the day and judgment. Pt  reports high anxiety regarding a dentist appt this afternoon. Pt reports sleeping better due to new medication helping her fall asleep, however is struggling with nightmares that keep her from staying asleep. Pt reports continued passive SI and tried to process mindfully.  Pt denies plan/intent. Pt able to process. Pt engaged in discussion.         Session Time: 10:00 - 11:00   Participation Level: Active   Behavioral Response: CasualAlertDepressed   Type of Therapy: Group Therapy   Treatment Goals addressed: Coping   Interventions: CBT, DBT, Supportive and Reframing   Summary: Cln facilitated processing group around difficult relationships. Group members shared struggles they face in relationships. Cln brought in topics of self-esteem, CBT thought challenging, boundaries, and communication to aid growth.     Therapist Response: Pt engaged in discussion and is able to process.             Session Time: 11:00 - 12:00   Participation Level: Active   Behavioral Response: CasualAlertDepressed   Type of Therapy: Group Therapy   Treatment Goals addressed: Coping   Interventions: Supportive, Reframing   Summary: Spiritual Care group   Therapist Response: Pt engaged in session. See chaplain note           Session Time: 12:00 -1:00   Participation Level: Active   Behavioral Response: CasualAlertDepressed   Type of Therapy: Group therapy   Treatment Goals addressed: Coping   Interventions: Psychosocial skills training, Supportive   Summary: 12:00 - 12:50: Occupational Therapy group 12:50 -1:00 Clinician led check-out. Clinician assessed for immediate needs, medication compliance and efficacy, and safety concerns   Therapist Response: 12:00 - 12:50: Patient engaged in group. See OT note.  12:50 -  1:00: At check-out, patient rates her mood at a 2 on a scale of 1-10 with 10 being great. Pt reports afternoon plans of going to the dentist. Pt demonstrates some progress  as evidenced by calmer mood in the afternoon. Patient reports fleeting passive SI without plan/intent at end of session and states she will reach out to crisis support if needed.      Suicidal/Homicidal: Yeswithout intent/plan  Plan: Pt will continue in PHP while working to decrease trauma symptoms, decrease SI, and increase ability to manage symptoms in a healthy manner.    Diagnosis: PTSD (post-traumatic stress disorder) [F43.10]    1. PTSD (post-traumatic stress disorder)       Donia Guiles, LCSW 05/06/2021

## 2021-05-07 ENCOUNTER — Inpatient Hospital Stay: Payer: BC Managed Care – PPO | Attending: Oncology

## 2021-05-07 ENCOUNTER — Encounter (HOSPITAL_COMMUNITY): Payer: Self-pay

## 2021-05-07 ENCOUNTER — Other Ambulatory Visit: Payer: Self-pay

## 2021-05-07 DIAGNOSIS — E538 Deficiency of other specified B group vitamins: Secondary | ICD-10-CM | POA: Insufficient documentation

## 2021-05-07 DIAGNOSIS — D509 Iron deficiency anemia, unspecified: Secondary | ICD-10-CM | POA: Diagnosis present

## 2021-05-07 LAB — IRON AND TIBC
Iron: 79 ug/dL (ref 28–170)
Saturation Ratios: 26 % (ref 10.4–31.8)
TIBC: 300 ug/dL (ref 250–450)
UIBC: 221 ug/dL

## 2021-05-07 LAB — FERRITIN: Ferritin: 71 ng/mL (ref 11–307)

## 2021-05-07 LAB — CBC WITH DIFFERENTIAL/PLATELET
Abs Immature Granulocytes: 0.04 10*3/uL (ref 0.00–0.07)
Basophils Absolute: 0.1 10*3/uL (ref 0.0–0.1)
Basophils Relative: 1 %
Eosinophils Absolute: 0.4 10*3/uL (ref 0.0–0.5)
Eosinophils Relative: 4 %
HCT: 43.9 % (ref 36.0–46.0)
Hemoglobin: 14.3 g/dL (ref 12.0–15.0)
Immature Granulocytes: 0 %
Lymphocytes Relative: 24 %
Lymphs Abs: 2.4 10*3/uL (ref 0.7–4.0)
MCH: 28.5 pg (ref 26.0–34.0)
MCHC: 32.6 g/dL (ref 30.0–36.0)
MCV: 87.6 fL (ref 80.0–100.0)
Monocytes Absolute: 0.4 10*3/uL (ref 0.1–1.0)
Monocytes Relative: 4 %
Neutro Abs: 6.5 10*3/uL (ref 1.7–7.7)
Neutrophils Relative %: 67 %
Platelets: 276 10*3/uL (ref 150–400)
RBC: 5.01 MIL/uL (ref 3.87–5.11)
RDW: 13.5 % (ref 11.5–15.5)
WBC: 9.7 10*3/uL (ref 4.0–10.5)
nRBC: 0 % (ref 0.0–0.2)

## 2021-05-07 LAB — FOLATE: Folate: 11.7 ng/mL (ref >5.9)

## 2021-05-07 NOTE — Psych (Signed)
Virtual Visit via Video Note  I connected with Sharon Gibson on 04/01/21 at  9:00 AM EDT by a video enabled telemedicine application and verified that I am speaking with the correct person using two identifiers.  Location: Patient: patient home Provider: clinical home office   I discussed the limitations of evaluation and management by telemedicine and the availability of in person appointments. The patient expressed understanding and agreed to proceed.  I discussed the assessment and treatment plan with the patient. The patient was provided an opportunity to ask questions and all were answered. The patient agreed with the plan and demonstrated an understanding of the instructions.   The patient was advised to call back or seek an in-person evaluation if the symptoms worsen or if the condition fails to improve as anticipated.  Pt was provided 240 minutes of non-face-to-face time during this encounter.   Donia Guiles, LCSW    [facility] PHP THERAPIST PROGRESS NOTE  Kati Riggenbach [id number]  Session Time: 9:00 - 10:00  Participation Level: Active  Behavioral Response: CasualAlertAnxious and Depressed  Type of Therapy: Group Therapy  Treatment Goals addressed: Coping  Interventions: CBT, DBT, Supportive, and Reframing  Summary: Clinician led check-in regarding current stressors and situation. Clinician utilized active listening and empathetic response and validated patient emotions. Clinician facilitated processing group on pertinent issues.   Therapist Response: Lyla Jasek is a 40 y.o. female who presents with trauma symptoms.  Patient arrived within time allowed and reports that she is feeling "anxious" Patient rates her mood at a 3.5 on a scale of 1-10 with 10 being great. Pt states her weekend was "fine" and she was either at home or with her parents. Pt states feeling accomplished because she was able to clean her patio. Pt reports concerns re: hypervigilance  at night and not being able to sleep without locking her bedroom door. Pt states continued panic and chest pains re: driving which is a concern for her. Pt denies SI over the weekend. Pt able to process. Pt engaged in discussion.         Session Time: 10:00 - 11:00   Participation Level: Active   Behavioral Response: CasualAlertDepressed   Type of Therapy: Group Therapy   Treatment Goals addressed: Coping   Interventions: CBT, DBT, Supportive and Reframing   Summary: Cln led processing group for pt's current struggles. Group members shared stressors and provided support and feedback. Cln brought in topics of boundaries, healthy relationships, and unhealthy thought processes to inform discussion.      Therapist Response: Pt able to process and provide support to group.          Session Time: 11:00- 12:00   Participation Level: Active   Behavioral Response: CasualAlertDepressed   Type of Therapy: Group Therapy, OT   Treatment Goals addressed: Coping   Interventions: Psychosocial skills training, Supportive   Summary: Occupational Therapy group   Therapist Response: Patient engaged in group. See OT note.           Session Time: 12:00 -1:00   Participation Level: Active   Behavioral Response: CasualAlertDepressed   Type of Therapy: Group therapy   Treatment Goals addressed: Coping   Interventions: CBT; Solution focused; Supportive; Reframing   Summary: 12:00 - 12:50: Cln introduced topic of boundaries. Cln discussed how boundaries inform our relationships and affect self-esteem and personal agency. Group discussed the three types of boundaries: rigid, porous, and healthy and when each type is most helpful/harmful.  12:50 -1:00 Clinician led check-out. Clinician  assessed for immediate needs, medication compliance and efficacy, and safety concerns   Therapist Response: 12:50 - 1:00: Pt engaged in discussion and reports having mostly porous and rigid boundaries.   12:50 - 1:00: At check-out, patient rates her mood at a 3 on a scale of 1-10 with 10 being great. Pt reports afternoon plans of spending time with her mom. Pt demonstrates some progress as evidenced by decrease in SI. Patient denies SI/HI at end of group.      Suicidal/Homicidal: Nowithout intent/plan  Plan: Pt will continue in PHP while working to decrease trauma symptoms, decrease SI, and increase ability to manage symptoms in a healthy manner.    Diagnosis: PTSD (post-traumatic stress disorder) [F43.10]    1. PTSD (post-traumatic stress disorder)       Donia Guiles, LCSW 05/07/2021

## 2021-05-07 NOTE — Progress Notes (Signed)
Virtual Visit via Video Note  I connected with Sharon Gibson on 05/06/21 at  9:00 AM EDT by a video enabled telemedicine application and verified that I am speaking with the correct person using two identifiers.  At orientation to the IOP program, Case Manager discussed the limitations of evaluation and management by telemedicine and the availability of in person appointments. The patient expressed understanding and agreed to proceed with virtual visits throughout the duration of the program.   Location:  Patient: Patient Home Provider: Home Office   History of Present Illness: MDD and PTSD  Observations/Objective: Check In: Case Manager checked in with all participants to review discharge dates, insurance authorizations, work-related documents and needs from the treatment team regarding medications. Client stated needs and engaged in discussion. Case Manager introduced a new Client, with group members welcoming and starting the joining process.   Initial Therapeutic Activity: Counselor facilitated a check-in with group members to assess mood and current functioning. Client shared details of their mental health management since our last session, including challenges and successes. Counselor engaged group in discussion, covering the following topics: sleep disturbances, cognitive coping skills, and gratitude statements. Client presents with moderate depression and moderate anxiety. Client denied any current SI/HI/psychosis.  Second Therapeutic Activity: Counselor continued information and discussion on forgiveness, radical forgiveness, self-forgiveness and healing from past/childhood wounds. Group members shared their experiences and beliefs on these subjects. Counselor reviewed self/homework with group. Group members shared practical applications, identifying people or situation they could forgive.    Check Out: Counselor closed program by allowing time to celebrate a graduating group member.  Counselor shared reflections on progress and allow space for group members to share well wishes and encouragements with the graduating client. Counselor prompted graduating client to share takeaways, reflect on progress and final thoughts for the group. Client endorsed safety plan to be followed to prevent safety issues.  Assessment and Plan: Clinician recommends that Client remain in IOP treatment to better manage mental health symptoms, stabilization and to address treatment plan goals. Clinician recommends adherence to crisis/safety plan, taking medications as prescribed, and following up with medical professionals if any issues arise.    Follow Up Instructions: Clinician will send Webex link for next session. The Client was advised to call back or seek an in-person evaluation if the symptoms worsen or if the condition fails to improve as anticipated.     I provided 180 minutes of non-face-to-face time during this encounter.     Hilbert Odor, LCSW

## 2021-05-08 ENCOUNTER — Inpatient Hospital Stay (HOSPITAL_BASED_OUTPATIENT_CLINIC_OR_DEPARTMENT_OTHER): Payer: BC Managed Care – PPO | Admitting: Oncology

## 2021-05-08 ENCOUNTER — Encounter (HOSPITAL_COMMUNITY): Payer: Self-pay | Admitting: Family

## 2021-05-08 ENCOUNTER — Other Ambulatory Visit (HOSPITAL_COMMUNITY): Payer: BC Managed Care – PPO | Admitting: Psychiatry

## 2021-05-08 ENCOUNTER — Inpatient Hospital Stay: Payer: BC Managed Care – PPO

## 2021-05-08 ENCOUNTER — Encounter: Payer: Self-pay | Admitting: Oncology

## 2021-05-08 VITALS — BP 124/58 | HR 58 | Temp 97.3°F | Resp 18

## 2021-05-08 DIAGNOSIS — E538 Deficiency of other specified B group vitamins: Secondary | ICD-10-CM

## 2021-05-08 DIAGNOSIS — F411 Generalized anxiety disorder: Secondary | ICD-10-CM

## 2021-05-08 DIAGNOSIS — F332 Major depressive disorder, recurrent severe without psychotic features: Secondary | ICD-10-CM

## 2021-05-08 DIAGNOSIS — D509 Iron deficiency anemia, unspecified: Secondary | ICD-10-CM | POA: Diagnosis not present

## 2021-05-08 DIAGNOSIS — F4310 Post-traumatic stress disorder, unspecified: Secondary | ICD-10-CM

## 2021-05-08 MED ORDER — CYANOCOBALAMIN 1000 MCG/ML IJ SOLN
1000.0000 ug | Freq: Once | INTRAMUSCULAR | Status: AC
  Administered 2021-05-08: 1000 ug via INTRAMUSCULAR

## 2021-05-08 MED ORDER — VITAMIN B-12 1000 MCG SL SUBL: 1000.0000 ug | SUBLINGUAL_TABLET | Freq: Every day | SUBLINGUAL | 1 refills | Status: DC

## 2021-05-08 NOTE — Progress Notes (Signed)
Pt here for follow up. No new concerns voiced.   

## 2021-05-08 NOTE — Progress Notes (Signed)
Virtual Visit via Video Note  I connected with Sharon Gibson on 05/08/21 at  4:00 PM EDT by a video enabled telemedicine application and verified that I am speaking with the correct person using two identifiers.  Location: Patient: Home Provider: Office   I discussed the limitations of evaluation and management by telemedicine and the availability of in person appointments. The patient expressed understanding and agreed to proceed.    I discussed the assessment and treatment plan with the patient. The patient was provided an opportunity to ask questions and all were answered. The patient agreed with the plan and demonstrated an understanding of the instructions.   The patient was advised to call back or seek an in-person evaluation if the symptoms worsen or if the condition fails to improve as anticipated.  I provided 15 minutes of non-face-to-face time during this encounter.   [name], NP   [facility] Intensive Outpatient Program Discharge Summary  Sharon Gibson [id number]  Admission date: 04/22/2021 Discharge date: 05/08/2021  Reason for admission: Sharon Gibson is a 40 y.o. Caucasian female presents with reported depression, anxiety and PTD'S .  Stated she was recently discharged from inpatient in [place].  Reports she was at a [facility] when she had medication adjustments for her depression and anxiety.  States she is prescribed Zoloft 250 mg, gabapentin and recently discontinued from Klonopin to clonidine for anxiety.  Patient reports dental and multiple stressors.  Reports financial stressors states she is unhappy with the current employer.  States she often gets overwhelmed easily.   Progress in Program Toward Treatment Goals: Ongoing, patient recently completed partial hospitalization programming and is nearing the end of intensive outpatient programming.  Denying suicidal or homicidal ideations today however reports struggling with  suicidal thoughts over the weekend.  Continues to endorse uncertainty regarding going back to work. Reported she is considering " resigning"  Sharon Gibson is scheduled for biweekly therapy sessions.  She reported has helped her mood since attending intensive outpatient programming and therapy.   Progress (rationale): Keep follow-up with [facility], and continued biweekly therapy sessions.  Case manager to make additional outpatient resources available ie Mental health or [place] and or [employer]  Take all medications as prescribed. Keep all follow-up appointments as scheduled.  Do not consume alcohol or use illegal drugs while on prescription medications. Report any adverse effects from your medications to your primary care provider promptly.  In the event of recurrent symptoms or worsening symptoms, call 911, a crisis hotline, or go to the nearest emergency department for evaluation.    [name], NP 05/08/2021

## 2021-05-08 NOTE — Progress Notes (Signed)
[facility]  [address], [address] [place], [place] [address] Phone: [phone]  Fax: [phone]   Clinic Day:  05/08/2021  Referring physician: Dorcas Carrow, DO  Chief Complaint: Sharon Gibson is a 40 y.o. female presents for follow up of  iron and B12 deficiency   PERTINENT HEMATOLOGY HISTORY Patient previously followed up by Dr.Corcoran, patient switched care to me on 05/08/21 Extensive medical record review was performed by me  #Patient has a history of gastric bypass. #Iron deficiency anemia, she has received IV iron (different preparations) x 5 ([place], [place]; [place], [place];  [place], [place]) with reactions requiring Benadryl and steroids. Per patient she had side effects from IV Venofer treatments, including dizziness, lightheadedness, low blood pressure, excess sweating, aches, and pains, nausea, and headache after her infusion. She has received Feraheme treatments as well as Venofer treatments previously.  Most recently Venofer treatments.  Patient gets Benadryl, steroids as premed to minimize adverse reactions.  #She has B12 deficiency.  Patient was previously on monthly vitamin B12 injections. Due to her work, she has not been able to come to get monthly injections.   #History of multiple pulmonary emboli in 2003.  Per patient report, hypercoagulable work-up was negative.  She was on Coumadin x 6 months.  Not currently on anticoagulation.   INTERVAL HISTORY Sharon Gibson is a 40 y.o. female who has above history reviewed by me today presents for follow up visit for management of iron deficiency anemia and vitamin B12 deficiency Patient reports feeling fatigued.  Denies any black or bloody stool.  Past Medical History:  Diagnosis Date   Anemia    Anxiety    Chicken pox    Depression    Eating disorder    Has had residential treatment and hospitalization previously.   Heart murmur    as a child   History of anemia    RECIEVES  IRON INFUSIONS YEARLY   History of pulmonary embolus (PE) 2002   bilaterally-followed by [facility] hematology-w/u negative per pt   Hypothyroidism    no meds currently   PTSD (post-traumatic stress disorder)     Past Surgical History:  Procedure Laterality Date   CHOLECYSTECTOMY  2003   EXCISION OF ENDOMETRIOMA     GASTRIC BYPASS  2003   LAPAROSCOPIC OVARIAN CYSTECTOMY Left 05/10/2018   Procedure: LAPAROSCOPIC OVARIAN CYSTECTOMY;  Surgeon: Natale Milch, MD;  Location: [facility] ORS;  Service: Gynecology;  Laterality: Left;    Family History  Problem Relation Age of Onset   Depression Father    Diabetes Father    Anxiety disorder Father    Alzheimer's disease Maternal Grandfather    Alzheimer's disease Maternal Grandmother    Prostate cancer Paternal Grandfather    Lung cancer Paternal Grandmother     Social History:  reports that she has never smoked. She has never used smokeless tobacco. She reports current alcohol use. She reports that she does not use drugs. She is single, but sexually active and is currently on IUD Mirena for birth control.  The patient is alone today.  Allergies:  Allergies  Allergen Reactions   Penicillins Hives    Has patient had a PCN reaction causing immediate rash, facial/tongue/throat swelling, SOB or lightheadedness with hypotension: yes Has patient had a PCN reaction causing severe rash involving mucus membranes or skin necrosis: no Has patient had a PCN reaction that required hospitalization: yes Has patient had a PCN reaction occurring within the last 10 years: no If all of  the above answers are "NO", then may proceed with Cephalosporin use.    Azithromycin    Other     Narcotics- Burning in the stomach (whether PO or IV)   Strawberry (Diagnostic) Swelling    AND WALNUTS-TONGUE SWELLING    Current Medications: Current Outpatient Medications  Medication Sig Dispense Refill   Cyanocobalamin (VITAMIN B-12) 1000 MCG SUBL Place 1 tablet  (1,000 mcg total) under the tongue daily. 90 tablet 1   gabapentin (NEURONTIN) 100 MG capsule Take 1 capsule (100 mg total) by mouth at bedtime. 90 capsule 1   hydrOXYzine (ATARAX/VISTARIL) 25 MG tablet Take 1 tablet (25 mg total) by mouth 3 (three) times daily. (Patient taking differently: Take 25 mg by mouth daily.) 270 tablet 1   levonorgestrel (MIRENA) 20 MCG/24HR IUD 1 each by Intrauterine route once.     levothyroxine (SYNTHROID) 125 MCG tablet Take 1 tablet (125 mcg total) by mouth daily. 90 tablet 3   prazosin (MINIPRESS) 1 MG capsule Take 1 capsule (1 mg total) by mouth at bedtime. 90 capsule 1   sertraline (ZOLOFT) 100 MG tablet Take 2.5 tablets (250 mg total) by mouth at bedtime. 225 tablet 1   Vitamin D, Ergocalciferol, (DRISDOL) 1.25 MG (50000 UNIT) CAPS capsule Take 1 capsule (50,000 Units total) by mouth every 7 (seven) days. 12 capsule 1   cloNIDine (CATAPRES) 0.1 MG tablet Take 1 tablet (0.1 mg total) by mouth 2 (two) times daily. (Patient not taking: Reported on 05/08/2021) 180 tablet 1   EPINEPHrine 0.3 mg/0.3 mL IJ SOAJ injection SMARTSIG:0.3 Milliliter(s) IM Once PRN (Patient not taking: Reported on 05/08/2021)     No current facility-administered medications for this visit.    Review of Systems  Constitutional:  Positive for malaise/fatigue. Negative for chills and fever.  HENT:  Negative for sore throat.   Eyes:  Negative for redness.  Respiratory:  Negative for cough, shortness of breath and wheezing.   Cardiovascular:  Negative for chest pain, palpitations and leg swelling.  Gastrointestinal:  Negative for abdominal pain, blood in stool, nausea and vomiting.  Genitourinary:  Negative for dysuria.  Musculoskeletal:  Negative for myalgias.  Skin:  Negative for rash.  Neurological:  Negative for dizziness, tingling and tremors.  Endo/Heme/Allergies:  Does not bruise/bleed easily.  Psychiatric/Behavioral:  Negative for hallucinations.   Performance status (ECOG):  1  Vitals Blood pressure (!) 124/58, pulse (!) 58, temperature (!) 97.3 F (36.3 C), resp. rate 18.   Physical Exam Constitutional:      General: She is not in acute distress.    Appearance: She is well-developed. She is not diaphoretic.  HENT:     Head: Normocephalic and atraumatic.     Mouth/Throat:     Mouth: Mucous membranes are moist.     Pharynx: Oropharynx is clear. No oropharyngeal exudate.  Eyes:     General: No scleral icterus.    Pupils: Pupils are equal, round, and reactive to light.  Neck:     Vascular: No JVD.  Cardiovascular:     Rate and Rhythm: Normal rate and regular rhythm.     Heart sounds: Normal heart sounds. No murmur heard. Pulmonary:     Effort: Pulmonary effort is normal. No respiratory distress.     Breath sounds: Normal breath sounds. No wheezing or rales.  Chest:     Chest wall: No tenderness.  Abdominal:     General: Bowel sounds are normal. There is no distension.     Palpations: Abdomen is soft.  There is no mass.     Tenderness: There is no abdominal tenderness. There is no guarding or rebound.  Musculoskeletal:        General: No swelling or tenderness. Normal range of motion.     Cervical back: Normal range of motion and neck supple.  Lymphadenopathy:     Head:     Right side of head: No preauricular, posterior auricular or occipital adenopathy.     Left side of head: No preauricular, posterior auricular or occipital adenopathy.     Cervical: No cervical adenopathy.     Upper Body:     Right upper body: No supraclavicular adenopathy.     Left upper body: No supraclavicular adenopathy.     Lower Body: No right inguinal adenopathy. No left inguinal adenopathy.  Skin:    General: Skin is warm and dry.     Coloration: Skin is not pale.     Findings: No erythema or rash.  Neurological:     Mental Status: She is alert and oriented to person, place, and time.  Psychiatric:        Mood and Affect: Mood normal.   Appointment on 05/07/2021   Component Date Value Ref Range Status   WBC 05/07/2021 9.7  4.0 - 10.5 K/uL Final   RBC 05/07/2021 5.01  3.87 - 5.11 MIL/uL Final   Hemoglobin 05/07/2021 14.3  12.0 - 15.0 g/dL Final   HCT 57/84/696208/06/2021 43.9  36.0 - 46.0 % Final   MCV 05/07/2021 87.6  80.0 - 100.0 fL Final   MCH 05/07/2021 28.5  26.0 - 34.0 pg Final   MCHC 05/07/2021 32.6  30.0 - 36.0 g/dL Final   RDW 95/28/413208/06/2021 13.5  11.5 - 15.5 % Final   Platelets 05/07/2021 276  150 - 400 K/uL Final   nRBC 05/07/2021 0.0  0.0 - 0.2 % Final   Neutrophils Relative % 05/07/2021 67  % Final   Neutro Abs 05/07/2021 6.5  1.7 - 7.7 K/uL Final   Lymphocytes Relative 05/07/2021 24  % Final   Lymphs Abs 05/07/2021 2.4  0.7 - 4.0 K/uL Final   Monocytes Relative 05/07/2021 4  % Final   Monocytes Absolute 05/07/2021 0.4  0.1 - 1.0 K/uL Final   Eosinophils Relative 05/07/2021 4  % Final   Eosinophils Absolute 05/07/2021 0.4  0.0 - 0.5 K/uL Final   Basophils Relative 05/07/2021 1  % Final   Basophils Absolute 05/07/2021 0.1  0.0 - 0.1 K/uL Final   Immature Granulocytes 05/07/2021 0  % Final   Abs Immature Granulocytes 05/07/2021 0.04  0.00 - 0.07 K/uL Final   Performed at [facility], [address] [place], [place] [address]   Ferritin 05/07/2021 71  11 - 307 ng/mL Final   Performed at [facility], [address] [place], [place] [address]   Folate 05/07/2021 11.7  >5.9 ng/mL Final   Performed at [facility], [address] [place], [place] [address]   Iron 05/07/2021 79  28 - 170 ug/dL Final   TIBC 34/74/259508/06/2021 300  250 - 450 ug/dL Final   Saturation Ratios 05/07/2021 26  10.4 - 31.8 % Final   UIBC 05/07/2021 221  ug/dL Final   Performed at [facility], [address] [place], [place] [address]    Assessment and Plan. 1. Iron deficiency anemia, unspecified iron deficiency anemia type   2. B12 deficiency    #Iron deficiency in the context of history of gastric bypass  Labs reviewed and  discussed with patient. Hemoglobin is stable at 14.3, iron saturation 26, ferritin 71. Hold off additional IV Venofer treatment at this point .  Recommend patient to try multivitamin containing small amount of iron as maintenance   #History of vitamin B12 deficiency. B12 level is at 463. Proceed with vitamin B12 1000 MCG IM today. Recommend patient to try sublingual vitamin B12 1000 MCG daily.  Prescription was sent to pharmacy.  RTC in 6 months for MD assess, labs [facility] with diff, ferritin, iron studies, folate] +/- Venofer.  I discussed the assessment and treatment plan with the patient.  The patient was provided an opportunity to ask questions and all were answered.  The patient agreed with the plan and demonstrated an understanding of the instructions.  The patient was advised to call back if the symptoms worsen or if the condition fails to improve as anticipated.  Rickard Patience, MD, PhD Hematology Oncology [facility] 05/08/2021

## 2021-05-08 NOTE — Psych (Signed)
Virtual Visit via Video Note  I connected with Sharon Gibson on 04/02/21 at  9:00 AM EDT by a video enabled telemedicine application and verified that I am speaking with the correct person using two identifiers.  Location: Patient: patient home Provider: clinical home office   I discussed the limitations of evaluation and management by telemedicine and the availability of in person appointments. The patient expressed understanding and agreed to proceed.  I discussed the assessment and treatment plan with the patient. The patient was provided an opportunity to ask questions and all were answered. The patient agreed with the plan and demonstrated an understanding of the instructions.   The patient was advised to call back or seek an in-person evaluation if the symptoms worsen or if the condition fails to improve as anticipated.  Pt was provided 240 minutes of non-face-to-face time during this encounter.   Donia Guiles, LCSW    [facility] PHP THERAPIST PROGRESS NOTE  Jordi Kamm [id number]  Session Time: 9:00 - 10:00  Participation Level: Active  Behavioral Response: CasualAlertAnxious and Depressed  Type of Therapy: Group Therapy  Treatment Goals addressed: Coping  Interventions: CBT, DBT, Supportive, and Reframing  Summary: Clinician led check-in regarding current stressors and situation. Clinician utilized active listening and empathetic response and validated patient emotions. Clinician facilitated processing group on pertinent issues.   Therapist Response: Sharon Gibson is a 40 y.o. female who presents with trauma symptoms.  Patient arrived within time allowed and reports that she is feeling "a lot" Patient rates her mood at a 3 on a scale of 1-10 with 10 being great. Pt reports she had a productive conversation with her mom about SI and it was helpful. Pt reports she was catching up on neighborhood emails and read about lurkers and some car break-ins from  while she was gone and it increased her hypervigilance. Pt states she felt like she fought sleep last night due to feeling unsafe. Pt reports continued passive SI and tried to process mindfully. Pt denies plan/intent. Pt able to process. Pt engaged in discussion.         Session Time: 10:00 - 11:00   Participation Level: Active   Behavioral Response: CasualAlertDepressed   Type of Therapy: Group Therapy   Treatment Goals addressed: Coping   Interventions: CBT, DBT, Supportive and Reframing   Summary: Cln led discussion on "why" and "what now" in terms of how we focus on our problems. Group members shared how focus on "why" has impacted them and barriers to focusing on "what now." Group able to process. Cln encouraged pt's to consider what "why" accomplishes.      Therapist Response: Pt engaged in discussion and reports she tends to focus on why as a way to resolve the issue.            Session Time: 11:00 - 12:00   Participation Level: Active   Behavioral Response: CasualAlertDepressed   Type of Therapy: Group Therapy   Treatment Goals addressed: Coping   Interventions: Supportive, Reframing   Summary: Spiritual Care group   Therapist Response: Pt engaged in session. See chaplain note           Session Time: 12:00 -1:00   Participation Level: Active   Behavioral Response: CasualAlertDepressed   Type of Therapy: Group therapy   Treatment Goals addressed: Coping   Interventions: Psychosocial skills training, Supportive   Summary: 12:00 - 12:50: Occupational Therapy group 12:50 -1:00 Clinician led check-out. Clinician assessed for immediate needs, medication compliance and  efficacy, and safety concerns   Therapist Response: 12:00 - 12:50: Patient engaged in group. See OT note.  12:50 - 1:00: At check-out, patient rates her mood at a 4 on a scale of 1-10 with 10 being great. Pt reports afternoon plans of taking a nap. Pt demonstrates some progress as evidenced  by increased activity in the afternoon. Patient reports fleeting passive SI without plan/intent at end of session and states she will reach out to crisis support if needed.      Suicidal/Homicidal: Yeswithout intent/plan  Plan: Pt will continue in PHP while working to decrease trauma symptoms, decrease SI, and increase ability to manage symptoms in a healthy manner.    Diagnosis: PTSD (post-traumatic stress disorder) [F43.10]    1. PTSD (post-traumatic stress disorder)   2. GAD (generalized anxiety disorder)   3. Severe episode of recurrent major depressive disorder, without psychotic features (HCC)       Donia Guiles, LCSW 05/08/2021

## 2021-05-08 NOTE — Progress Notes (Signed)
Virtual Visit via Video Note  I connected with Sharon Gibson on @TODAY @ at  4:00 PM EDT by a video enabled telemedicine application and verified that I am speaking with the correct person using two identifiers.  Location: Patient: at home Provider: at office   I discussed the limitations of evaluation and management by telemedicine and the availability of in person appointments. The patient expressed understanding and agreed to proceed.  I discussed the assessment and treatment plan with the patient. The patient was provided an opportunity to ask questions and all were answered. The patient agreed with the plan and demonstrated an understanding of the instructions.   The patient was advised to call back or seek an in-person evaluation if the symptoms worsen or if the condition fails to improve as anticipated.  I provided 20 minutes of non-face-to-face time during this encounter.   , M.Ed,CNA   Patient ID: Sharon Gibson, female   DOB: 1981/04/06, 40 y.o.   MRN: [id number] As per previous CCA states:  Pt presents as PHP referral post-residential treatment at [facility] in [place]. Pt reports spending one month in residential treatment for PTSD, MDD, and GAD. Pt reports she gained insight and strategies for coping in the facility however does not feel as if she is stable and is very concerned about being out of a controlled environment. Pt reports she lives alone with her dog, however parents are within 15 min and are primary supports for her. Pt states she hates her job and feels stuck in her personal life. Pt reports history of trauma throughout her life which have gone untreated. Pt reports history of an easting disorder and having multiple residential and PHP admissions for the ED, last episode approximately 2017. Pt states she is managing her ED and feels the untreated underlying MH issues are most problematic for her. Pt states multiple Emergency Dept visists due to chest  pains that have been attributed to anxiety/panic. Pt denies AVH, problematic substance use, and HI. Pt reports daily passive SI and denies plan/intent.  Pt attended eight MH-IOP days.  Reports feeling better; decreased nightmares.  C/O difficulty falling asleep.  Pt has made the decision not to return to her job.  Denies SI/HI or A/V hallucinations.  On a scale of 1-10 (10 being worst) pt rates her depression at a 4 and anxiety at a 10. A:  D/C today.  F/U with [facility] tomorrow and PCP.  Strongly encouraged support groups through [facility].  R:  Pt receptive.  [facility], M.Ed,CNA

## 2021-05-08 NOTE — Patient Instructions (Signed)
D:  Patient completed MH-IOP today.  A:  Discharge today.  Follow up with [facility] 05-09-21 and PCP.  Encouraged support groups.  R:  Patient receptive.

## 2021-05-09 ENCOUNTER — Other Ambulatory Visit (HOSPITAL_COMMUNITY): Payer: BC Managed Care – PPO

## 2021-05-09 ENCOUNTER — Other Ambulatory Visit: Payer: Self-pay

## 2021-05-12 ENCOUNTER — Other Ambulatory Visit: Payer: Self-pay

## 2021-05-12 ENCOUNTER — Other Ambulatory Visit (HOSPITAL_COMMUNITY): Payer: BC Managed Care – PPO

## 2021-05-13 ENCOUNTER — Ambulatory Visit (HOSPITAL_COMMUNITY): Payer: BC Managed Care – PPO

## 2021-05-13 NOTE — Psych (Signed)
Virtual Visit via Video Note  I connected with Sharon Gibson on 04/07/21 at  9:00 AM EDT by a video enabled telemedicine application and verified that I am speaking with the correct person using two identifiers.  Location: Patient: patient home Provider: clinical home office   I discussed the limitations of evaluation and management by telemedicine and the availability of in person appointments. The patient expressed understanding and agreed to proceed.  I discussed the assessment and treatment plan with the patient. The patient was provided an opportunity to ask questions and all were answered. The patient agreed with the plan and demonstrated an understanding of the instructions.   The patient was advised to call back or seek an in-person evaluation if the symptoms worsen or if the condition fails to improve as anticipated.  Pt was provided 180 minutes of non-face-to-face time during this encounter.   Donia Guiles, LCSW    [facility] PHP THERAPIST PROGRESS NOTE  Sharon Gibson [id number]  Session Time: 9:00 - 10:30  Participation Level: Active  Behavioral Response: CasualAlertAnxious and Depressed  Type of Therapy: Group Therapy  Treatment Goals addressed: Coping  Interventions: CBT, DBT, Supportive, and Reframing  Summary: Clinician led check-in regarding current stressors and situation. Clinician utilized active listening and empathetic response and validated patient emotions. Clinician facilitated processing group on pertinent issues.   Therapist Response: Sharon Gibson is a 40 y.o. female who presents with trauma symptoms.  Patient arrived within time allowed and reports that she is feeling "no idea" Patient rates her mood at a 3 on a scale of 1-10 with 10 being great. Pt reports her weekend went well but a "dark cloud" came quickly after she woke up and she is having chest tightness and feels like she is vibrating. Pt reports improved sleep over the weekend  and continued decrease of nightmares d/t new medication. Pt reports having SI throughout the weekend and was able to use thought stopping to manage. Pt denies plan/intent. Pt able to process. Pt engaged in discussion.         Session Time: 10:30- 11:30   Participation Level: Active   Behavioral Response: CasualAlertDepressed   Type of Therapy: Group Therapy, OT   Treatment Goals addressed: Coping   Interventions: Psychosocial skills training, Supportive   Summary: Occupational Therapy group   Therapist Response: Patient engaged in group. See OT note.           Session Time: 1:00 -1:30   Participation Level: Active   Behavioral Response: CasualAlertDepressed   Type of Therapy: Individual therapy   Treatment Goals addressed: Coping   Interventions: CBT; Solution focused; Supportive; Reframing   Summary: Cln led individual session via phone. Cln discussed pt's progress, continued barriers, and ways to problem solve those barriers. Cln worked with pt to determine realistic goals for right now.   Therapist Response: Pt reports she is able to see progress AEB increased ability to manage SI and mood improvement more days than before. Pt reports barrier of feeling easily overwhelmed and panicked, and hypervigilance. Pt states struggling with forward thinking and not having a plan for the future, but also not feeling prepared to handle thinking about the future. Pt able to create realistic goals to focus on to help her know when she can handle bigger goals. Pt denies current SI.       Suicidal/Homicidal: Nowithout intent/plan  Plan: Pt will continue in PHP while working to decrease trauma symptoms, decrease SI, and increase ability to manage symptoms in a  healthy manner.    Diagnosis: PTSD (post-traumatic stress disorder) [F43.10]    1. PTSD (post-traumatic stress disorder)   2. GAD (generalized anxiety disorder)       Donia Guiles, LCSW 05/13/2021

## 2021-05-13 NOTE — Psych (Signed)
Virtual Visit via Video Note  I connected with Sharon Gibson on 04/03/21 at  9:00 AM EDT by a video enabled telemedicine application and verified that I am speaking with the correct person using two identifiers.  Location: Patient: patient home Provider: clinical home office   I discussed the limitations of evaluation and management by telemedicine and the availability of in person appointments. The patient expressed understanding and agreed to proceed.  I discussed the assessment and treatment plan with the patient. The patient was provided an opportunity to ask questions and all were answered. The patient agreed with the plan and demonstrated an understanding of the instructions.   The patient was advised to call back or seek an in-person evaluation if the symptoms worsen or if the condition fails to improve as anticipated.  Pt was provided 240 minutes of non-face-to-face time during this encounter.   Donia Guiles, LCSW    [facility] PHP THERAPIST PROGRESS NOTE  Sharon Gibson [id number]  Session Time: 9:00 - 10:00  Participation Level: Active  Behavioral Response: CasualAlertAnxious and Depressed  Type of Therapy: Group Therapy  Treatment Goals addressed: Coping  Interventions: CBT, DBT, Supportive, and Reframing  Summary: Clinician led check-in regarding current stressors and situation. Clinician utilized active listening and empathetic response and validated patient emotions. Clinician facilitated processing group on pertinent issues.   Therapist Response: Sharon Gibson is a 40 y.o. female who presents with trauma symptoms.  Patient arrived within time allowed and reports that she is feeling "anxious" Patient rates her mood at a 3 on a scale of 1-10 with 10 being great. Pt reports she feels panicked this morning and feels she is vibrating and has mild chest pains. Pt states she had nightmares last night and continues to fight sleep. Pt reports yesterday she  struggled with spiraling thoughts re: work after hearing from HR. Pt states she spent the evening with her parents afterward which helped her rebound emotionally. Pt reports continued passive SI denying plan and intent. Pt able to process. Pt engaged in discussion.         Session Time: 10:00 - 11:00   Participation Level: Active   Behavioral Response: CasualAlertDepressed   Type of Therapy: Group Therapy   Treatment Goals addressed: Coping   Interventions: CBT, DBT, Supportive and Reframing   Summary: Cln led discussion on the 5 stages of grief and the way loss affects [place]. Cln encouraged pt to consider grief as a journey to accepting a new future. Cln created space for pt to process grief concerns and validated pt's experiences.      Therapist Response: Pt engaged in discussion and was able to identify areas of loss and process.           Session Time: 11:00- 12:00   Participation Level: Active   Behavioral Response: CasualAlertDepressed   Type of Therapy: Group Therapy, OT   Treatment Goals addressed: Coping   Interventions: Psychosocial skills training, Supportive   Summary: Occupational Therapy group   Therapist Response: Patient engaged in group. See OT note.           Session Time: 12:00 -1:00   Participation Level: Active   Behavioral Response: CasualAlertDepressed   Type of Therapy: Group therapy   Treatment Goals addressed: Coping   Interventions: CBT; Solution focused; Supportive; Reframing   Summary: 12:00 - 12:50: Cln continued topic of boundaries. Cln discussed emotional and intellectual boundaries including they way they present and difficulties with both. Group members discussed the ways in which  emotional and intellectual boundaries is a struggle for them.  12:50 -1:00 Clinician led check-out. Clinician assessed for immediate needs, medication compliance and efficacy, and safety concerns   Therapist Response: 12:50 - 1:00: Pt engaged in  discussion and is able to identify ways in which emotional and intellectual boundaries affect them. 12:50 - 1:00: At check-out, patient rates her mood at a 4.5 on a scale of 1-10 with 10 being great. Pt reports afternoon plans going to a doctor's appointment. Pt demonstrates some progress as evidenced by increased willingness to utilize coping. Patient denies SI/HI at end of group.      Suicidal/Homicidal: Nowithout intent/plan  Plan: Pt will continue in PHP while working to decrease trauma symptoms, decrease SI, and increase ability to manage symptoms in a healthy manner.    Diagnosis: PTSD (post-traumatic stress disorder) [F43.10]    1. PTSD (post-traumatic stress disorder)       Donia Guiles, LCSW 05/13/2021

## 2021-05-13 NOTE — Psych (Signed)
Virtual Visit via Video Note  I connected with Sharon Gibson on 04/04/21 at  9:00 AM EDT by a video enabled telemedicine application and verified that I am speaking with the correct person using two identifiers.  Location: Patient: patient home Provider: clinical home office   I discussed the limitations of evaluation and management by telemedicine and the availability of in person appointments. The patient expressed understanding and agreed to proceed.  I discussed the assessment and treatment plan with the patient. The patient was provided an opportunity to ask questions and all were answered. The patient agreed with the plan and demonstrated an understanding of the instructions.   The patient was advised to call back or seek an in-person evaluation if the symptoms worsen or if the condition fails to improve as anticipated.  Pt was provided 240 minutes of non-face-to-face time during this encounter.   Donia Guiles, LCSW    [facility] PHP THERAPIST PROGRESS NOTE  Sharon Gibson [id number]  Session Time: 9:00 - 10:00  Participation Level: Active  Behavioral Response: CasualAlertAnxious and Depressed  Type of Therapy: Group Therapy  Treatment Goals addressed: Coping  Interventions: CBT, DBT, Supportive, and Reframing  Summary: Clinician led check-in regarding current stressors and situation. Clinician utilized active listening and empathetic response and validated patient emotions. Clinician facilitated processing group on pertinent issues.   Therapist Response: Sharon Gibson is a 40 y.o. female who presents with trauma symptoms.  Patient arrived within time allowed and reports that she is feeling "okay" Patient rates her mood at a 4 on a scale of 1-10 with 10 being great. Pt reports her physical went well and the only health concern is high cholesterol. Pt states she felt "relieved" about the lack of health concerns. Pt states she made dinner for the first time in a  "long time." Pt states she started Prazosin last night and didn't sleep well, however did not have any nightmares that she remembers. Pt able to process. Pt engaged in discussion.         Session Time: 10:00 - 11:00   Participation Level: Active   Behavioral Response: CasualAlertDepressed   Type of Therapy: Group Therapy   Treatment Goals addressed: Coping   Interventions: CBT, DBT, Supportive and Reframing   Summary: Cln introduced DBT concept of radical acceptance. Cln discussed radical acceptance as a strategy to decrease distress and to manage situations outside of their control. Group volunteered struggles they are experiencing with control and cln helped group apply radical acceptance.      Therapist Response: Pt engaged in discussion and identified ways she can apply radical acceptance in her life.           Session Time: 11:00- 12:00   Participation Level: Active   Behavioral Response: CasualAlertDepressed   Type of Therapy: Group Therapy, OT   Treatment Goals addressed: Coping   Interventions: Psychosocial skills training, Supportive   Summary: Occupational Therapy group   Therapist Response: Patient engaged in group. See OT note.           Session Time: 12:00 -1:00   Participation Level: Active   Behavioral Response: CasualAlertDepressed   Type of Therapy: Group therapy   Treatment Goals addressed: Coping   Interventions: CBT; Solution focused; Supportive; Reframing   Summary: 12:00 - 12:50: Cln continued topic of boundaries. Cln discussed material and time boundaries including they way they present and difficulties with both. Group members discussed the ways in which material and time boundaries is a struggle for them.  12:50 -1:00 Clinician led check-out. Clinician assessed for immediate needs, medication compliance and efficacy, and safety concerns   Therapist Response: 12:50 - 1:00: Pt engaged in discussion and is able to identify ways in which  material and time boundaries affect them. 12:50 - 1:00: At check-out, patient rates her mood at a 2 on a scale of 1-10 with 10 being great. Pt reports afternoon plans spending time with a friend. Pt demonstrates some progress as evidenced by increased ability to do things outside of group. Patient denies SI/HI at end of group.      Suicidal/Homicidal: Nowithout intent/plan  Plan: Pt will continue in PHP while working to decrease trauma symptoms, decrease SI, and increase ability to manage symptoms in a healthy manner.    Diagnosis: PTSD (post-traumatic stress disorder) [F43.10]    1. PTSD (post-traumatic stress disorder)       Donia Guiles, LCSW 05/13/2021

## 2021-05-15 ENCOUNTER — Ambulatory Visit (HOSPITAL_COMMUNITY): Payer: BC Managed Care – PPO

## 2021-05-19 ENCOUNTER — Ambulatory Visit (HOSPITAL_COMMUNITY): Payer: BC Managed Care – PPO

## 2021-05-20 ENCOUNTER — Ambulatory Visit (HOSPITAL_COMMUNITY): Payer: BC Managed Care – PPO

## 2021-05-22 ENCOUNTER — Ambulatory Visit (HOSPITAL_COMMUNITY): Payer: BC Managed Care – PPO

## 2021-05-26 ENCOUNTER — Ambulatory Visit (HOSPITAL_COMMUNITY): Payer: BC Managed Care – PPO

## 2021-05-26 NOTE — Psych (Signed)
Virtual Visit via Video Note  I connected with Sharon Gibson on 04/08/21 at  9:00 AM EDT by a video enabled telemedicine application and verified that I am speaking with the correct person using two identifiers.  Location: Patient: patient home Provider: clinical home office   I discussed the limitations of evaluation and management by telemedicine and the availability of in person appointments. The patient expressed understanding and agreed to proceed.  I discussed the assessment and treatment plan with the patient. The patient was provided an opportunity to ask questions and all were answered. The patient agreed with the plan and demonstrated an understanding of the instructions.   The patient was advised to call back or seek an in-person evaluation if the symptoms worsen or if the condition fails to improve as anticipated.  Pt was provided 240 minutes of non-face-to-face time during this encounter.   Donia Guiles, LCSW    [facility] PHP THERAPIST PROGRESS NOTE  Sharon Gibson [id number]  Session Time: 9:00 - 10:00  Participation Level: Active  Behavioral Response: CasualAlertAnxious and Depressed  Type of Therapy: Group Therapy  Treatment Goals addressed: Coping  Interventions: CBT, DBT, Supportive, and Reframing  Summary: Clinician led check-in regarding current stressors and situation. Clinician utilized active listening and empathetic response and validated patient emotions. Clinician facilitated processing group on pertinent issues.   Therapist Response: Sharon Gibson is a 40 y.o. female who presents with trauma symptoms.  Patient arrived within time allowed and reports that she is feeling "super frustrated" Patient rates her mood at a 3 on a scale of 1-10 with 10 being great. Pt reports spiraling due to feeling she is not progressing quickly. Pt reports continued struggles with hypervigilance at night and fear that someone will enter her home despite checks.  Pt reports poor sleep. Pt reports afternoon went well, but struggles over evening/night felt bigger. Pt able to process. Pt engaged in discussion.         Session Time: 10:00 - 11:00   Participation Level: Active   Behavioral Response: CasualAlertDepressed   Type of Therapy: Group Therapy   Treatment Goals addressed: Coping   Interventions: CBT, DBT, Supportive and Reframing   Summary: Cln led discussion on anxious behaviors. Group members shared patterns of anxiety they experience. Cln encouraged pt's to increase insight into the roots of their anxious behaviors to be able to address triggers.      Therapist Response: Pt engaged in discussion and reports increased insight into triggers.          Session Time: 11:00- 12:00   Participation Level: Active   Behavioral Response: CasualAlertDepressed   Type of Therapy: Group Therapy, OT   Treatment Goals addressed: Coping   Interventions: Psychosocial skills training, Supportive   Summary: Occupational Therapy group   Therapist Response: Patient engaged in group. See OT note.           Session Time: 12:00 -1:00   Participation Level: Active   Behavioral Response: CasualAlertDepressed   Type of Therapy: Group therapy   Treatment Goals addressed: Coping   Interventions: CBT; Solution focused; Supportive; Reframing   Summary: 12:00 - 12:50: Cln continued topic of boundaries and introduced how to set and maintain healthy boundaries. Cln utilized handout "how to set boundaries" and group members worked through examples to practice setting appropriate boundaries.  12:50 -1:00 Clinician led check-out. Clinician assessed for immediate needs, medication compliance and efficacy, and safety concerns   Therapist Response: 12:50 - 1:00: Pt engaged in discussion and reports  struggle with maintaining boundaries.  12:50 - 1:00: At check-out, patient rates her mood at a 4 on a scale of 1-10 with 10 being great. Pt reports  afternoon plans of napping. Pt demonstrates some progress as evidenced by effectively pushing away SI. Patient denies SI/HI at end of group.      Suicidal/Homicidal: Nowithout intent/plan  Plan: Pt will continue in PHP while working to decrease trauma symptoms, decrease SI, and increase ability to manage symptoms in a healthy manner.    Diagnosis: PTSD (post-traumatic stress disorder) [F43.10]    1. PTSD (post-traumatic stress disorder)   2. Severe recurrent major depression without psychotic features [facility])       Donia Guiles, LCSW 05/26/2021

## 2021-05-26 NOTE — Psych (Signed)
Virtual Visit via Video Note  I connected with [name] on 04/09/21 at  9:00 AM EDT by a video enabled telemedicine application and verified that I am speaking with the correct person using two identifiers.  Location: Patient: patient home Provider: clinical home office   I discussed the limitations of evaluation and management by telemedicine and the availability of in person appointments. The patient expressed understanding and agreed to proceed.  I discussed the assessment and treatment plan with the patient. The patient was provided an opportunity to ask questions and all were answered. The patient agreed with the plan and demonstrated an understanding of the instructions.   The patient was advised to call back or seek an in-person evaluation if the symptoms worsen or if the condition fails to improve as anticipated.  Pt was provided 90 minutes of non-face-to-face time during this encounter.   Lorin Glass, LCSW    [facility] PHP THERAPIST PROGRESS NOTE  Malyiah Fellows [id number]  Session Time: 9:00 - 10:30  Participation Level: Active  Behavioral Response: CasualAlertAnxious and Depressed  Type of Therapy: Group Therapy  Treatment Goals addressed: Coping  Interventions: CBT, DBT, Supportive, and Reframing  Summary: Clinician led check-in regarding current stressors and situation. Clinician utilized active listening and empathetic response and validated patient emotions. Clinician facilitated processing group on pertinent issues.   Therapist Response: Abby Tucholski is a 40 y.o. female who presents with trauma symptoms. Patient arrived within time allowed and reports that she is feeling "not good." Patient rates her mood at a 1 on a scale of 1-10 with 10 being great. Pt reports she didn't sleep "hardly at all" and has been spiraling since at least 3 am. Pt reports having SI with plan of overdosing on her medication. Pt states her parents keep her medication  typically, however they brought it over for her to fill her weekly pill box yesterday and left it, so her prescription bottles are in the dining room. Pt states negative world view that makes it hard for her to hope. Cln unable to de-escalate pt. Medical provider, Myrle Sheng met with pt one-on-one to assess for risk and safety plan. See note.  Pt left group once parents arrived at her house to follow provider's instructions to go to the hospital.     Suicidal/Homicidal: Nowithout intent/plan  Plan: Pt will be assessed for need for hospitalization.    Diagnosis: PTSD (post-traumatic stress disorder) [F43.10]    1. PTSD (post-traumatic stress disorder)   2. Severe recurrent major depression without psychotic features [facility])       Lorin Glass, LCSW 05/26/2021

## 2021-05-27 ENCOUNTER — Ambulatory Visit (HOSPITAL_COMMUNITY): Payer: BC Managed Care – PPO

## 2021-05-29 ENCOUNTER — Ambulatory Visit (HOSPITAL_COMMUNITY): Payer: BC Managed Care – PPO

## 2021-05-29 NOTE — Psych (Signed)
Virtual Visit via Video Note  I connected with [name] on 04/10/21 at  9:00 AM EDT by a video enabled telemedicine application and verified that I am speaking with the correct person using two identifiers.  Location: Patient: patient's parent's home Provider: clinical home office   I discussed the limitations of evaluation and management by telemedicine and the availability of in person appointments. The patient expressed understanding and agreed to proceed.  I discussed the assessment and treatment plan with the patient. The patient was provided an opportunity to ask questions and all were answered. The patient agreed with the plan and demonstrated an understanding of the instructions.   The patient was advised to call back or seek an in-person evaluation if the symptoms worsen or if the condition fails to improve as anticipated.  Pt was provided 240 minutes of non-face-to-face time during this encounter.   Donia Guiles, LCSW    [facility] PHP THERAPIST PROGRESS NOTE  Adonica Fukushima [id number]  Session Time: 9:00 - 10:00  Participation Level: Active  Behavioral Response: CasualAlertAnxious and Depressed  Type of Therapy: Group Therapy  Treatment Goals addressed: Coping  Interventions: CBT, DBT, Supportive, and Reframing  Summary: Clinician led check-in regarding current stressors and situation. Clinician utilized active listening and empathetic response and validated patient emotions. Clinician facilitated processing group on pertinent issues.   Therapist Response: Renesmay Nesbitt is a 40 y.o. female who presents with trauma symptoms.  Patient arrived within time allowed and reports that she is feeling "better than yesterday, but not awesome." Patient rates her mood at a 2.5 on a scale of 1-10 with 10 being great. Pt reports she has been with her parents since leaving group yesterday and is currently at their house. Pt states her medications have been secured  and she does not have access. Pt reports continued SI and denies plan and intent. Pt states moments of feeling "okay" yesterday but easily devolving into depressive thoughts. Pt reports sleep was difficult and she was up/down due to hypervigilance. Pt continues to refuse hospitalization however commits to continuing safety plan.Pt able to process. Pt engaged in discussion.         Session Time: 10:00 - 11:00   Participation Level: Active   Behavioral Response: CasualAlertDepressed   Type of Therapy: Group Therapy   Treatment Goals addressed: Coping   Interventions: CBT, DBT, Supportive and Reframing   Summary: Cln led discussion on the connection between fear and anxiety. Cln contextualized anxiety and fear as both being future oriented and fed by the unknown. Group members shared ways in which fear interacts with their anxiety and the problems it creates. Cln encouraged CBT thought challenging and reframing as ways to address porblematic fears and anxieties.      Therapist Response: Pt engaged in discussion and is able to make connections between fear and anxiety.           Session Time: 11:00- 12:00   Participation Level: Active   Behavioral Response: CasualAlertDepressed   Type of Therapy: Group Therapy, OT   Treatment Goals addressed: Coping   Interventions: Psychosocial skills training, Supportive   Summary: Occupational Therapy group   Therapist Response: Patient engaged in group. See OT note.           Session Time: 12:00 -1:00   Participation Level: Active   Behavioral Response: CasualAlertDepressed   Type of Therapy: Group therapy   Treatment Goals addressed: Coping   Interventions: CBT; Solution focused; Supportive; Reframing   Summary: 12:00 -  12:50: Cln introduced CBT and the way in which it can provide context for addressing stumbling blocks. Group discussed "the problem is not the problem, the problem is how we're thinking about the problem" and  tried to change perspective on current struggles.  12:50 -1:00 Clinician led check-out. Clinician assessed for immediate needs, medication compliance and efficacy, and safety concerns   Therapist Response: 12:50 - 1:00: Pt engaged in discussion and is able to attempt reframing using CBT.  12:50 - 1:00: At check-out, patient rates her mood at a 3 on a scale of 1-10 with 10 being great. Pt reports afternoon plans of staying with parents. Pt demonstrates some progress as evidenced by following safety plan Patient continues to report SI and denies intent and plan at end of group.      Suicidal/Homicidal: Yeswithout intent/plan  Plan: Pt will continue in PHP while working to decrease trauma symptoms, decrease SI, and increase ability to manage symptoms in a healthy manner.    Diagnosis: PTSD (post-traumatic stress disorder) [F43.10]    1. PTSD (post-traumatic stress disorder)       Donia Guiles, LCSW 05/29/2021

## 2021-06-05 NOTE — Progress Notes (Signed)
Virtual Visit via Video Note   I connected with Sharon Gibson on 04/16/21 at  9:00 AM EDT by a video enabled telemedicine application and verified that I am speaking with the correct person using two identifiers.   Location: Patient: patient's home Provider: clinical home office   I discussed the limitations of evaluation and management by telemedicine and the availability of in person appointments. The patient expressed understanding and agreed to proceed.   I discussed the assessment and treatment plan with the patient. The patient was provided an opportunity to ask questions and all were answered. The patient agreed with the plan and demonstrated an understanding of the instructions.   The patient was advised to call back or seek an in-person evaluation if the symptoms worsen or if the condition fails to improve as anticipated.   Pt was provided 240 minutes of non-face-to-face time during this encounter.     Donia Guiles, LCSW       [facility] PHP THERAPIST PROGRESS NOTE   Sharon Gibson [id number]   Session Time: 9:00 - 10:00   Participation Level: Active   Behavioral Response: CasualAlertAnxious and Depressed   Type of Therapy: Group Therapy   Treatment Goals addressed: Coping   Interventions: CBT, DBT, Supportive, and Reframing   Summary: Clinician led check-in regarding current stressors and situation. Clinician utilized active listening and empathetic response and validated patient emotions. Clinician facilitated processing group on pertinent issues.   Therapist Response: Sharon Gibson is a 40 y.o. female who presents with trauma symptoms.  Patient arrived within time allowed and reports that she is feeling ""okay." Patient rates her mood at a 4 on a scale of 1-10 with 10 being great. Pt reports she is "cautiously optimistic" about her current situation. Pt states she had to take her mom to the hospital for chest pains yesterday and that caused panic and  some spiraling on parents' mortality. Pt states she was able to bring herself to the present. Pt reports she spent her first night alone since last week and it went "surprisingly well" and she was able to sleep 8 hours with lots of checking before bed. Pt able to process. Pt engaged in discussion.           Session Time: 10:00 - 11:00   Participation Level: Active   Behavioral Response: CasualAlertDepressed   Type of Therapy: Group Therapy   Treatment Goals addressed: Coping   Interventions: CBT, DBT, Supportive and Reframing   Summary: Cln led discussion on personal standards and they way in which it impacts the way we view ourselves and our abilties. Group members discussed judgment, struggles, and barriers they experience in terms of personal standards. Cln brought in topics of balance, grace, and kindness. Cln proposed the "best friend test" as a way to calibrate whether we are vieweing our situation with kindness or harshness.      Therapist Response: Pt engaged in discussion and reports willingness to utilize the best friend test.         Session Time: 11:00 - 12:00   Participation Level: Active   Behavioral Response: CasualAlertDepressed   Type of Therapy: Group Therapy   Treatment Goals addressed: Coping   Interventions: Supportive, Reframing   Summary: Spiritual Care group   Therapist Response: Pt engaged in session. See chaplain note           Session Time: 12:00 -1:00   Participation Level: Active   Behavioral Response: CasualAlertDepressed   Type of Therapy: Group therapy  Treatment Goals addressed: Coping   Interventions: Psychosocial skills training, Supportive   Summary: 12:00 - 12:50: Occupational Therapy group 12:50 -1:00 Clinician led check-out. Clinician assessed for immediate needs, medication compliance and efficacy, and safety concerns   Therapist Response: 12:00 - 12:50: Patient engaged in group. See OT note.  12:50 - 1:00: At  check-out, patient rates her mood at a 4.5 on a scale of 1-10 with 10 being great. Pt reports afternoon plans of building bookshelves. Pt demonstrates some progress as evidenced by managing difficult situations with improved reactions. Patient denies SI/HI at end of session.  Pt states she will not be in group tomorrow due to a conflicting therapy appointment.          Suicidal/Homicidal: Nowithout intent/plan   Plan: Pt will continue in PHP while working to decrease trauma symptoms, decrease SI, and increase ability to manage symptoms in a healthy manner.      Diagnosis:      PTSD (post-traumatic stress disorder) [F43.10]                          1. PTSD (post-traumatic stress disorder)   2. GAD (generalized anxiety disorder)           Donia Guiles, LCSW 06/05/2021

## 2021-06-05 NOTE — Psych (Signed)
Virtual Visit via Video Note  I connected with Sharon Gibson on 04/15/21 at  9:00 AM EDT by a video enabled telemedicine application and verified that I am speaking with the correct person using two identifiers.  Location: Patient: patient's home Provider: clinical home office   I discussed the limitations of evaluation and management by telemedicine and the availability of in person appointments. The patient expressed understanding and agreed to proceed.  I discussed the assessment and treatment plan with the patient. The patient was provided an opportunity to ask questions and all were answered. The patient agreed with the plan and demonstrated an understanding of the instructions.   The patient was advised to call back or seek an in-person evaluation if the symptoms worsen or if the condition fails to improve as anticipated.  Pt was provided 240 minutes of non-face-to-face time during this encounter.   Donia Guiles, LCSW    [facility] PHP THERAPIST PROGRESS NOTE  Sharon Gibson [id number]  Session Time: 9:00 - 10:00  Participation Level: Active  Behavioral Response: CasualAlertAnxious and Depressed  Type of Therapy: Group Therapy  Treatment Goals addressed: Coping  Interventions: CBT, DBT, Supportive, and Reframing  Summary: Clinician led check-in regarding current stressors and situation. Clinician utilized active listening and empathetic response and validated patient emotions. Clinician facilitated processing group on pertinent issues.   Therapist Response: Sharon Gibson is a 40 y.o. female who presents with trauma symptoms.  Patient arrived within time allowed and reports that she is feeling "super anxious." Patient rates her mood at a 2 on a scale of 1-10 with 10 being great. Pt reports continued struggles with SI through Sunday and states adherence to her safety plan. Pt reports improvement in SI over the past two days and states it is passive and less  frequent. Pt reports good sleep throughout the weekend and increased insight into poor sleep as a mood trigger for her. Pt states getting back into old hobby of candlemaking. Pt able to process. Pt engaged in discussion.         Session Time: 10:00 - 11:00   Participation Level: Active   Behavioral Response: CasualAlertDepressed   Type of Therapy: Group Therapy   Treatment Goals addressed: Coping   Interventions: CBT, DBT, Supportive and Reframing   Summary: Cln led discussion on support groups and the role they can provide in recovery and ongoing treatment. Group discussed success and barriers they have had with past support groups or they have with considering support groups. Cln provided resources for local support groups.      Therapist Response: Pt engaged in discussion and reports interest in engaging in support groups.          Session Time: 11:00- 12:00   Participation Level: Active   Behavioral Response: CasualAlertDepressed   Type of Therapy: Group Therapy, OT   Treatment Goals addressed: Coping   Interventions: Psychosocial skills training, Supportive   Summary: Occupational Therapy group   Therapist Response: Patient engaged in group. See OT note.           Session Time: 12:00 -1:00   Participation Level: Active   Behavioral Response: CasualAlertDepressed   Type of Therapy: Group therapy   Treatment Goals addressed: Coping   Interventions: CBT; Solution focused; Supportive; Reframing   Summary: 12:00 - 12:50: Cln led discussion on positive self-esteem. Group shared struggles they experience with self-esteem. Group highlights difficulty in accepting compliments. Cln encouraged pt's to use checking the facts as a way to challenge the negative  thinking which accompanies receving compliments. Cln suggested pt's consider making a compliment file in which they document positive things people say to/about them to review when they are feeling poorly about  themselves. 12:50 -1:00 Clinician led check-out. Clinician assessed for immediate needs, medication compliance and efficacy, and safety concerns   Therapist Response: 12:50 - 1:00: Pt engaged in conversation and is able to process.  12:50 - 1:00: At check-out, patient rates her mood at a 5 on a scale of 1-10 with 10 being great. Pt reports afternoon plans of making cnadles and spending time with parents. Pt demonstrates some progress as evidenced by decreased SI and increased hopefulness. Patient denies SI/HI at end of session.      Suicidal/Homicidal: Nowithout intent/plan  Plan: Pt will continue in PHP while working to decrease trauma symptoms, decrease SI, and increase ability to manage symptoms in a healthy manner.    Diagnosis: PTSD (post-traumatic stress disorder) [F43.10]    1. PTSD (post-traumatic stress disorder)   2. GAD (generalized anxiety disorder)       Donia Guiles, LCSW 06/05/2021

## 2021-06-05 NOTE — Psych (Signed)
Virtual Visit via Video Note  I connected with Sharon Gibson on 04/11/21 at  9:00 AM EDT by a video enabled telemedicine application and verified that I am speaking with the correct person using two identifiers.  Location: Patient: patient's parent's home Provider: clinical home office   I discussed the limitations of evaluation and management by telemedicine and the availability of in person appointments. The patient expressed understanding and agreed to proceed.  I discussed the assessment and treatment plan with the patient. The patient was provided an opportunity to ask questions and all were answered. The patient agreed with the plan and demonstrated an understanding of the instructions.   The patient was advised to call back or seek an in-person evaluation if the symptoms worsen or if the condition fails to improve as anticipated.  Pt was provided 240 minutes of non-face-to-face time during this encounter.   Donia Guiles, LCSW    [facility] PHP THERAPIST PROGRESS NOTE  Sharon Gibson [id number]  Session Time: 9:00 - 10:00  Participation Level: Active  Behavioral Response: CasualAlertAnxious and Depressed  Type of Therapy: Group Therapy  Treatment Goals addressed: Coping  Interventions: CBT, DBT, Supportive, and Reframing  Summary: Clinician led check-in regarding current stressors and situation. Clinician utilized active listening and empathetic response and validated patient emotions. Clinician facilitated processing group on pertinent issues.   Therapist Response: Sharon Gibson is a 40 y.o. female who presents with trauma symptoms.  Patient arrived within time allowed and reports that she is feeling "not great." Patient rates her mood at a 2 on a scale of 1-10 with 10 being great. Pt reports she had a "really bad night" and slept less than 4 hours. Pt states waking up around midnight with suicidal thoughts and she talked to her mom to manage. Pt states she  was able to de-escalate herself to a manageable level of SI and tried distractions and sensory engagement to manage symptoms. Pt continues to refuse hospitalization however commits to continuing safety plan. Mom continues to stay with pt so she is not alone. Pt able to process. Pt engaged in discussion. Pt improves with group engagement.  Pt states she scheduled individual trauma therapy twice a week beginning in two weeks.       Session Time: 10:00 - 11:00   Participation Level: Active   Behavioral Response: CasualAlertDepressed   Type of Therapy: Group Therapy   Treatment Goals addressed: Coping   Interventions: CBT, DBT, Supportive and Reframing   Summary: Cln led discussion on how to handle big feelings. Cln encouraged pt's to use the intensity of the feeling to guide how to proceed: if it is a lower intensity problem solving and thought challenging can be useful, for a larger intensity distress tolerance skills are the most effective strategy.      Therapist Response: Pt engaged in discussion and is able to share ways in which re-thinking action when they are emotional may benefit.            Session Time: 11:00- 12:00   Participation Level: Active   Behavioral Response: CasualAlertDepressed   Type of Therapy: Group Therapy, OT   Treatment Goals addressed: Coping   Interventions: Psychosocial skills training, Supportive   Summary: Occupational Therapy group   Therapist Response: Patient engaged in group. See OT note.           Session Time: 12:00 -1:00   Participation Level: Active   Behavioral Response: CasualAlertDepressed   Type of Therapy: Group therapy   Treatment  Goals addressed: Coping   Interventions: CBT; Solution focused; Supportive; Reframing   Summary: 12:00 - 12:50: Cln led activity on finding what makes you happy. Cln tasked group members to consider times in which they have been happy in the past, moments in which they have felt less  distressed recently, and things that give them any rumblings of joy. Cln worked with pt's to process barriers and to consider one thing for now, not the bigger picture.  12:50 -1:00 Clinician led check-out. Clinician assessed for immediate needs, medication compliance and efficacy, and safety concerns   Therapist Response: 12:50 - 1:00: Pt engaged in discussion. Pt able to process and come up with one thing thing to do to make herself happy. 12:50 - 1:00: At check-out, patient rates her mood at a 4 on a scale of 1-10 with 10 being great. Pt reports afternoon plans of engaging in hobby and spending time with parents. Pt demonstrates some progress as evidenced by following safety plan and utilizing skills. Patient continues to report SI and denies intent and plan at end of group.     Suicidal/Homicidal: Yeswithout intent/plan  Plan: Pt will continue in PHP while working to decrease trauma symptoms, decrease SI, and increase ability to manage symptoms in a healthy manner.    Diagnosis: PTSD (post-traumatic stress disorder) [F43.10]    1. PTSD (post-traumatic stress disorder)   2. GAD (generalized anxiety disorder)       Donia Guiles, LCSW 06/05/2021

## 2021-06-06 NOTE — Psych (Signed)
Virtual Visit via Video Note  I connected with Sharon Gibson on 04/18/21 at  9:00 AM EDT by a video enabled telemedicine application and verified that I am speaking with the correct person using two identifiers.  Location: Patient: patient's home Provider: clinical home office   I discussed the limitations of evaluation and management by telemedicine and the availability of in person appointments. The patient expressed understanding and agreed to proceed.  I discussed the assessment and treatment plan with the patient. The patient was provided an opportunity to ask questions and all were answered. The patient agreed with the plan and demonstrated an understanding of the instructions.   The patient was advised to call back or seek an in-person evaluation if the symptoms worsen or if the condition fails to improve as anticipated.  Pt was provided 240 minutes of non-face-to-face time during this encounter.   Donia Guiles, LCSW    [facility] PHP THERAPIST PROGRESS NOTE  Sharon Gibson [id number]  Session Time: 9:00 - 10:00  Participation Level: Active  Behavioral Response: CasualAlertAnxious and Depressed  Type of Therapy: Group Therapy  Treatment Goals addressed: Coping  Interventions: CBT, DBT, Supportive, and Reframing  Summary: Clinician led check-in regarding current stressors and situation. Clinician utilized active listening and empathetic response and validated patient emotions. Clinician facilitated processing group on pertinent issues.   Therapist Response: Sharon Gibson is a 40 y.o. female who presents with trauma symptoms.  Patient arrived within time allowed and reports that she is feeling "anxious." Patient rates her mood at a 3 on a scale of 1-10 with 10 being great. Pt reports states tightness in her chest this morning and doing deep breathing to manage anxiety.  Pt reports broken sleep. Pt states passive SI and denies plan/intent. Pt reports she is  struggling with self-judgment re: decreased ability to achieve tasks. Pt able to process. Pt engaged in discussion.         Session Time: 10:00 - 11:00   Participation Level: Active   Behavioral Response: CasualAlertDepressed   Type of Therapy: Group Therapy   Treatment Goals addressed: Coping   Interventions: CBT, DBT, Supportive and Reframing   Summary: Cln led discussion on control and the way it impacts our lives. Group members shared struggles and worked to identify the way in which control is contributing to the struggle. Cln utilized CBT thought challenging and the Catch-Challenge-Change model to address their control issues.      Therapist Response: Pt engaged in discussion and is able to determine ways in which control is an issue for her and brainstormed how to address it in a healthy manner.          Session Time: 11:00- 12:00   Participation Level: Active   Behavioral Response: CasualAlertDepressed   Type of Therapy: Group Therapy, OT   Treatment Goals addressed: Coping   Interventions: Psychosocial skills training, Supportive   Summary: Occupational Therapy group   Therapist Response: Patient engaged in group. See OT note.           Session Time: 12:00 -1:00   Participation Level: Active   Behavioral Response: CasualAlertDepressed   Type of Therapy: Group therapy   Treatment Goals addressed: Coping   Interventions: CBT; Solution focused; Supportive; Reframing   Summary: 12:00 - 12:50: Cln led discussion on ways to manage stressors and feelings over the weekend. Group members  brainstormed things to do over the weekend for multiple levels of energy, access, and moods. Cln reviewed crisis services should they be  needed and provided pt's with the text crisis line, mobile crisis, national suicide hotline, [facility] 24/7 line, and information on [facility].    12:50 -1:00 Clinician led check-out. Clinician assessed for immediate needs, medication  compliance and efficacy, and safety concerns   Therapist Response: 12:50 - 1:00: Pt engaged in discussion and is able to identify 3 ideas of what to do over the weekend to keep their mind engaged.  12:50 - 1:00: At check-out, patient rates her mood at a 5 on a scale of 1-10 with 10 being great. Pt reports afternoon plans of working on candles. Pt demonstrates some progress as evidenced by increased utilization and reliance on skills. Patient denies SI/HI at end of session.      Suicidal/Homicidal: Nowithout intent/plan  Plan: Pt will continue in PHP while working to decrease trauma symptoms, decrease SI, and increase ability to manage symptoms in a healthy manner.    Diagnosis: PTSD (post-traumatic stress disorder) [F43.10]    1. PTSD (post-traumatic stress disorder)   2. GAD (generalized anxiety disorder)       Donia Guiles, LCSW 06/06/2021

## 2021-06-09 ENCOUNTER — Encounter: Payer: Self-pay | Admitting: Oncology

## 2021-06-16 ENCOUNTER — Other Ambulatory Visit: Payer: Self-pay

## 2021-06-16 ENCOUNTER — Encounter: Payer: Self-pay | Admitting: Family Medicine

## 2021-06-16 ENCOUNTER — Ambulatory Visit (INDEPENDENT_AMBULATORY_CARE_PROVIDER_SITE_OTHER): Payer: 59 | Admitting: Family Medicine

## 2021-06-16 ENCOUNTER — Encounter: Payer: Self-pay | Admitting: Oncology

## 2021-06-16 VITALS — BP 93/71 | HR 79 | Temp 98.8°F | Resp 16

## 2021-06-16 DIAGNOSIS — Z30430 Encounter for insertion of intrauterine contraceptive device: Secondary | ICD-10-CM

## 2021-06-16 DIAGNOSIS — L0100 Impetigo, unspecified: Secondary | ICD-10-CM | POA: Diagnosis not present

## 2021-06-16 DIAGNOSIS — F332 Major depressive disorder, recurrent severe without psychotic features: Secondary | ICD-10-CM

## 2021-06-16 MED ORDER — DOXYCYCLINE HYCLATE 100 MG PO TABS: 100.0000 mg | ORAL_TABLET | Freq: Two times a day (BID) | ORAL | 0 refills | Status: DC

## 2021-06-16 NOTE — Progress Notes (Signed)
BP 93/71 (BP Location: Right Arm, Patient Position: Sitting, Cuff Size: Large)   Pulse 79   Temp 98.8 F (37.1 C) (Oral)   Resp 16   SpO2 98%    Subjective:    Patient ID: Sharon Gibson, female    DOB: 06-23-1981, 40 y.o.   MRN: [id number]  HPI: Sharon Gibson is a 40 y.o. female  Chief Complaint  Patient presents with   Depression   DEPRESSION- left her job and is doing better with that. Still doing her therapy and that has been going well. Her dog has been sick and she is concerned about how she is doing. Has a plan in case she doesn't do well. Mood status: stable Satisfied with current treatment?: yes Symptom severity: severe  Duration of current treatment : chronic Side effects: no Medication compliance: excellent compliance Psychotherapy/counseling: yes current Depressed mood: yes Anxious mood: yes Anhedonia: no Significant weight loss or gain: no Insomnia: no  Fatigue: yes Feelings of worthlessness or guilt: yes Impaired concentration/indecisiveness: yes Suicidal ideations: no plan Hopelessness: no Crying spells: no Depression screen [facility] 2/9 06/16/2021 05/05/2021 04/22/2021 04/03/2021 03/25/2021  Decreased Interest 3 3 3 3 3   Down, Depressed, Hopeless 3 3 3 3 3   PHQ - 2 Score 6 6 6 6 6   Altered sleeping 3 3 3 3 3   Tired, decreased energy 3 3 3 3 3   Change in appetite 2 1 3 3 3   Feeling bad or failure about yourself  3 3 3 3 3   Trouble concentrating 3 3 3 3 2   Moving slowly or fidgety/restless 1 0 2 2 3   Suicidal thoughts 2 3 3 3 3   PHQ-9 Score 23 22 26 26 26   Difficult doing work/chores Extremely dIfficult Extremely dIfficult Extremely dIfficult Extremely dIfficult Extremely dIfficult  Some recent data might be hidden   RASH Duration:  weeks  Location: face  Itching: no Burning: no Redness: yes Oozing: no Scaling: yes Blisters: no Painful: no Fevers: no Change in detergents/soaps/personal care products: no Recent illness: no Recent  travel:no History of same: no Context: stable Alleviating factors: nothing Treatments attempted:hydrocortisone cream, benadryl, and lotion/moisturizer Shortness of breath: no  Throat/tongue swelling: no Myalgias/arthralgias: no    Relevant past medical, surgical, family and social history reviewed and updated as indicated. Interim medical history since our last visit reviewed. Allergies and medications reviewed and updated.  Review of Systems  Constitutional: Negative.   Respiratory: Negative.    Cardiovascular: Negative.   Gastrointestinal: Negative.   Musculoskeletal: Negative.   Skin: Negative.   Psychiatric/Behavioral:  Positive for dysphoric mood. Negative for agitation, behavioral problems, confusion, decreased concentration, hallucinations, self-injury, sleep disturbance and suicidal ideas. The patient is nervous/anxious. The patient is not hyperactive.    Per HPI unless specifically indicated above     Objective:    BP 93/71 (BP Location: Right Arm, Patient Position: Sitting, Cuff Size: Large)   Pulse 79   Temp 98.8 F (37.1 C) (Oral)   Resp 16   SpO2 98%   Wt Readings from Last 3 Encounters:  11/07/20 288 lb 14.6 oz (131.1 kg)  05/20/20 280 lb (127 kg)  05/08/20 (!) 304 lb 3.8 oz (138 kg)    Physical Exam Vitals and nursing note reviewed.  Constitutional:      General: She is not in acute distress.    Appearance: Normal appearance. She is not ill-appearing, toxic-appearing or diaphoretic.  HENT:     Head: Normocephalic and atraumatic.     Right  Ear: External ear normal.     Left Ear: External ear normal.     Nose: Nose normal.     Mouth/Throat:     Mouth: Mucous membranes are moist.     Pharynx: Oropharynx is clear.  Eyes:     General: No scleral icterus.       Right eye: No discharge.        Left eye: No discharge.     Extraocular Movements: Extraocular movements intact.     Conjunctiva/sclera: Conjunctivae normal.     Pupils: Pupils are equal,  round, and reactive to light.  Cardiovascular:     Rate and Rhythm: Normal rate and regular rhythm.     Pulses: Normal pulses.     Heart sounds: Normal heart sounds. No murmur heard.   No friction rub. No gallop.  Pulmonary:     Effort: Pulmonary effort is normal. No respiratory distress.     Breath sounds: Normal breath sounds. No stridor. No wheezing, rhonchi or rales.  Chest:     Chest wall: No tenderness.  Musculoskeletal:        General: Normal range of motion.     Cervical back: Normal range of motion and neck supple.  Skin:    General: Skin is warm and dry.     Capillary Refill: Capillary refill takes less than 2 seconds.     Coloration: Skin is not jaundiced or pale.     Findings: Rash (crusty rash on R side of chin) present. No bruising, erythema or lesion.  Neurological:     General: No focal deficit present.     Mental Status: She is alert and oriented to person, place, and time. Mental status is at baseline.  Psychiatric:        Mood and Affect: Mood normal.        Behavior: Behavior normal.        Thought Content: Thought content normal.        Judgment: Judgment normal.    Results for orders placed or performed in visit on 05/07/21  CBC with Differential/Platelet  Result Value Ref Range   WBC 9.7 4.0 - 10.5 K/uL   RBC 5.01 3.87 - 5.11 MIL/uL   Hemoglobin 14.3 12.0 - 15.0 g/dL   HCT 71.6 96.7 - 89.3 %   MCV 87.6 80.0 - 100.0 fL   MCH 28.5 26.0 - 34.0 pg   MCHC 32.6 30.0 - 36.0 g/dL   RDW 81.0 17.5 - 10.2 %   Platelets 276 150 - 400 K/uL   nRBC 0.0 0.0 - 0.2 %   Neutrophils Relative % 67 %   Neutro Abs 6.5 1.7 - 7.7 K/uL   Lymphocytes Relative 24 %   Lymphs Abs 2.4 0.7 - 4.0 K/uL   Monocytes Relative 4 %   Monocytes Absolute 0.4 0.1 - 1.0 K/uL   Eosinophils Relative 4 %   Eosinophils Absolute 0.4 0.0 - 0.5 K/uL   Basophils Relative 1 %   Basophils Absolute 0.1 0.0 - 0.1 K/uL   Immature Granulocytes 0 %   Abs Immature Granulocytes 0.04 0.00 - 0.07 K/uL   Ferritin  Result Value Ref Range   Ferritin 71 11 - 307 ng/mL  Folate  Result Value Ref Range   Folate 11.7 >5.9 ng/mL  Iron and TIBC  Result Value Ref Range   Iron 79 28 - 170 ug/dL   TIBC 585 277 - 824 ug/dL   Saturation Ratios 26 10.4 - 31.8 %  UIBC 221 ug/dL      Assessment & Plan:   Problem List Items Addressed This Visit       Other   Severe recurrent major depression without psychotic features (HCC) - Primary    Under good control on current regimen. Continue current regimen. Continue to monitor. Call with any concerns. Refills up to date. Continue to follow with counselor. Call with any changes. Has support and plan in case something happens to her dog.        Other Visit Diagnoses     Impetigo       Will treat with doxycycline due to allergies. Call with any concerns.    Encounter for insertion of intrauterine contraceptive device (IUD)       Will get her back into GYN. Overdue for pap and would like removal and replacement of IUD.   Relevant Orders   Ambulatory referral to Obstetrics / Gynecology        Follow up plan: Return in about 3 months (around 09/15/2021).

## 2021-06-16 NOTE — Psych (Signed)
Virtual Visit via Video Note  I connected with Sharon Gibson on 04/21/21 at  9:00 AM EDT by a video enabled telemedicine application and verified that I am speaking with the correct person using two identifiers.  Location: Patient: patient's home Provider: clinical home office   I discussed the limitations of evaluation and management by telemedicine and the availability of in person appointments. The patient expressed understanding and agreed to proceed.  I discussed the assessment and treatment plan with the patient. The patient was provided an opportunity to ask questions and all were answered. The patient agreed with the plan and demonstrated an understanding of the instructions.   The patient was advised to call back or seek an in-person evaluation if the symptoms worsen or if the condition fails to improve as anticipated.  Pt was provided 240 minutes of non-face-to-face time during this encounter.   Sharon Guiles, LCSW    [facility] PHP THERAPIST PROGRESS NOTE  Sharon Gibson [phone]  Session Time: 9:00 - 10:00  Participation Level: Active  Behavioral Response: CasualAlertAnxious and Depressed  Type of Therapy: Group Therapy  Treatment Goals addressed: Coping  Interventions: CBT, DBT, Supportive, and Reframing  Summary: Clinician led check-in regarding current stressors and situation. Clinician utilized active listening and empathetic response and validated patient emotions. Clinician facilitated processing group on pertinent issues.   Therapist Response: Sharon Gibson is a 40 y.o. female who presents with trauma symptoms.  Patient arrived within time allowed and reports that she is feeling "really anxious." Patient rates her mood at a 2 on a scale of 1-10 with 10 being great. Pt reports nerves regarding this being her last day in PHP and the transition to IOP. Pt reports she was able to complete some tasks over the weekend and her mood was "fine." Pt states  having SI last night and being able to manage with distraction. Pt able to process. Pt engaged in discussion.         Session Time: 10:00 - 11:00   Participation Level: Active   Behavioral Response: CasualAlertDepressed   Type of Therapy: Group Therapy   Treatment Goals addressed: Coping   Interventions: CBT, DBT, Supportive and Reframing   Summary: Cln led processing group for pt's current struggles. Group members shared stressors and provided support and feedback. Cln brought in topics of boundaries, distractions, and unhealthy thought processes to inform discussion.      Therapist Response: Pt able to process and provide support to group.          Session Time: 11:00- 12:00   Participation Level: Active   Behavioral Response: CasualAlertDepressed   Type of Therapy: Group Therapy   Treatment Goals addressed: Coping   Interventions: CBT, DBT, Supportive and Reframing   Summary: Cln led discussion on thought fallacies, utilizing handout "Eleven Beliefs that Make Life Better." Group reviewed handout and discussed the struggles with believing these beliefs and how to reinforce them in our lives.      Therapist Response: Pt engaged in discussion and identifies struggling most with stepping away from control.          Session Time: 12:00 -1:00   Participation Level: Active   Behavioral Response: CasualAlertDepressed   Type of Therapy: Group therapy   Treatment Goals addressed: Coping   Interventions: CBT; Solution focused; Supportive; Reframing   Summary: 12:00 - 12:50: Cln led discussion on protecting our energy. Cln brought in topics of boundaries and self-esteem. Group discussed what drains them and why they engage in those  activities. Cln encouraged pt's to consider what will protect their energy when making decisions.  12:50 -1:00 Clinician led check-out. Clinician assessed for immediate needs, medication compliance and efficacy, and safety concerns    Therapist Response: 12:50 - 1:00: Pt enaged in discussion and reports increased insight. 12:50 - 1:00: At check-out, patient rates her mood at a 3.5 on a scale of 1-10 with 10 being great. Pt reports afternoon plans of making cnadles. Pt demonstrates some progress as evidenced by increased ability to manage SI in a healthy manner. Patient denies SI/HI at end of session.      Suicidal/Homicidal: Nowithout intent/plan  Plan: Pt will discharge from PHP due to meeting treatment goals of decreased trauma symptoms, decreased SI, and increased ability to manage symptoms in a healthy manner. Pt will step-down to IOP within this agency as of 7/26 to continue making progress. Pt and provider are aligned with discharge plan. Pt denies SI/HI at time of discharge.     Diagnosis: PTSD (post-traumatic stress disorder) [F43.10]    1. PTSD (post-traumatic stress disorder)   2. Severe recurrent major depression without psychotic features (HCC)   3. GAD (generalized anxiety disorder)       Sharon Guiles, LCSW 06/16/2021

## 2021-06-16 NOTE — Assessment & Plan Note (Signed)
Under good control on current regimen. Continue current regimen. Continue to monitor. Call with any concerns. Refills up to date. Continue to follow with counselor. Call with any changes. Has support and plan in case something happens to her dog.

## 2021-06-17 ENCOUNTER — Encounter: Payer: Self-pay | Admitting: Oncology

## 2021-07-18 ENCOUNTER — Other Ambulatory Visit: Payer: Self-pay

## 2021-07-18 ENCOUNTER — Encounter: Payer: Self-pay | Admitting: Oncology

## 2021-07-18 ENCOUNTER — Ambulatory Visit
Admission: RE | Admit: 2021-07-18 | Discharge: 2021-07-18 | Disposition: A | Discharge status: Home / Self Care | Payer: 59 | Source: Ambulatory Visit | Attending: Medical Oncology | Admitting: Medical Oncology

## 2021-07-18 VITALS — BP 126/63 | HR 70 | Temp 98.5°F | Resp 14 | Ht 63.0 in | Wt 289.0 lb

## 2021-07-18 DIAGNOSIS — K0889 Other specified disorders of teeth and supporting structures: Secondary | ICD-10-CM

## 2021-07-18 DIAGNOSIS — K047 Periapical abscess without sinus: Secondary | ICD-10-CM

## 2021-07-18 MED ORDER — ACETAMINOPHEN 500 MG PO TABS
1000.0000 mg | ORAL_TABLET | Freq: Once | ORAL | Status: AC
  Administered 2021-07-18: 1000 mg via ORAL

## 2021-07-18 MED ORDER — CLINDAMYCIN HCL 300 MG PO CAPS: 300.0000 mg | ORAL_CAPSULE | Freq: Three times a day (TID) | ORAL | 0 refills | Status: AC

## 2021-07-18 MED ORDER — KETOROLAC TROMETHAMINE 60 MG/2ML IM SOLN
60.0000 mg | Freq: Once | INTRAMUSCULAR | Status: AC
Start: 2021-07-18 — End: 2021-07-18
  Administered 2021-07-18: 60 mg via INTRAMUSCULAR

## 2021-07-18 NOTE — ED Provider Notes (Signed)
[facility]    CSN: [id number] Arrival date & time: 07/18/21  1732      History   Chief Complaint Chief Complaint  Patient presents with   Dental Pain    HPI Sharon Gibson is a 40 y.o. female.   HPI  Dental Pain: Patient reports a few months ago one of her lower left teeth chipped.  She states that she did not have dental insurance at the time so she was not able to afford to see her dentist although she did try.  She states that she was working really hard to take good care of the tooth to avoid an infection however 2 days ago she started to have significant pain in this area.  This has progressed to lymph node swelling and worsening pain.  Pain is sharp and throbbing in nature fairly significant.  She also has felt slightly feverish.  No vomiting, rash, trouble breathing or swallowing.  She has tried Tylenol and ibuprofen for symptoms without much relief.  She states that she has not had anything for symptoms in over 6 hours as she was hoping to get medication here. Of note she will have dental insurance starting on November 1st. LMP: 2-3 weeks ago.   Past Medical History:  Diagnosis Date   Anemia    Anxiety    Chicken pox    Depression    Eating disorder    Has had residential treatment and hospitalization previously.   Heart murmur    as a child   History of anemia    RECIEVES IRON INFUSIONS YEARLY   History of pulmonary embolus (PE) 2002   bilaterally-followed by [facility] hematology-w/u negative per pt   Hypothyroidism    no meds currently   PTSD (post-traumatic stress disorder)     Patient Active Problem List   Diagnosis Date Noted   Cervical strain 11/28/2019   Endometriosis determined by laparoscopy 06/13/2018   Ovarian cyst 03/01/2018   Hx pulmonary embolism 02/03/2018   Rash 10/16/2016   Generalized anxiety disorder 10/12/2016   Right knee pain 09/02/2016   Severe recurrent major depression without psychotic features (HCC) 07/23/2016    Bulimia nervosa, in partial remission, mild 07/23/2016    Class: Chronic   Iron deficiency anemia 04/08/2016   B12 deficiency 04/08/2016   S/P gastric bypass 03/20/2016   History of eating disorder 03/20/2016   Insomnia 03/20/2016   PTSD (post-traumatic stress disorder) 03/20/2016   Hypothyroidism 03/20/2016   PCOS (polycystic ovarian syndrome) 12/14/2014    Past Surgical History:  Procedure Laterality Date   CHOLECYSTECTOMY  2003   EXCISION OF ENDOMETRIOMA     GASTRIC BYPASS  2003   LAPAROSCOPIC OVARIAN CYSTECTOMY Left 05/10/2018   Procedure: LAPAROSCOPIC OVARIAN CYSTECTOMY;  Surgeon: Natale Milch, MD;  Location: [facility] ORS;  Service: Gynecology;  Laterality: Left;    OB History     Gravida  0   Para  0   Term  0   Preterm  0   AB  0   Living  0      SAB  0   IAB  0   Ectopic  0   Multiple  0   Live Births  0            Home Medications    Prior to Admission medications   Medication Sig Start Date End Date Taking? Authorizing Provider  cloNIDine (CATAPRES) 0.1 MG tablet Take 1 tablet (0.1 mg total) by mouth 2 (two) times  daily. Patient taking differently: Take 0.1 mg by mouth as needed. 05/05/21  Yes Johnson, Megan P, DO  Cyanocobalamin (VITAMIN B-12) 1000 MCG SUBL Place 1 tablet (1,000 mcg total) under the tongue daily. 05/08/21  Yes Rickard Patience, MD  gabapentin (NEURONTIN) 100 MG capsule Take 1 capsule (100 mg total) by mouth at bedtime. 05/05/21  Yes Johnson, Megan P, DO  hydrOXYzine (ATARAX/VISTARIL) 25 MG tablet Take 1 tablet (25 mg total) by mouth 3 (three) times daily. Patient taking differently: Take 25 mg by mouth daily. 05/05/21  Yes Johnson, Megan P, DO  levonorgestrel (MIRENA) 20 MCG/24HR IUD 1 each by Intrauterine route once.   Yes [provider]  levothyroxine (SYNTHROID) 125 MCG tablet Take 1 tablet (125 mcg total) by mouth daily. 05/05/21  Yes Johnson, Megan P, DO  prazosin (MINIPRESS) 1 MG capsule Take 1 capsule (1 mg total) by  mouth at bedtime. 05/05/21  Yes Johnson, Megan P, DO  sertraline (ZOLOFT) 100 MG tablet Take 2.5 tablets (250 mg total) by mouth at bedtime. 05/05/21  Yes Johnson, Megan P, DO  Vitamin D, Ergocalciferol, (DRISDOL) 1.25 MG (50000 UNIT) CAPS capsule Take 1 capsule (50,000 Units total) by mouth every 7 (seven) days. 04/04/21  Yes Johnson, Megan P, DO  doxycycline (VIBRA-TABS) 100 MG tablet Take 1 tablet (100 mg total) by mouth 2 (two) times daily. 06/16/21   Johnson, Megan P, DO  EPINEPHrine 0.3 mg/0.3 mL IJ SOAJ injection Inject 0.3 mg into the muscle as needed. 07/11/20   [provider]    Family History Family History  Problem Relation Age of Onset   Depression Father    Diabetes Father    Anxiety disorder Father    Alzheimer's disease Maternal Grandfather    Alzheimer's disease Maternal Grandmother    Prostate cancer Paternal Grandfather    Lung cancer Paternal Grandmother     Social History Social History   Tobacco Use   Smoking status: Never   Smokeless tobacco: Never  Vaping Use   Vaping Use: Never used  Substance Use Topics   Alcohol use: Yes    Alcohol/week: 0.0 - 1.0 standard drinks    Comment: rare   Drug use: No     Allergies   Penicillins, Azithromycin, Other, and Strawberry (diagnostic)   Review of Systems Review of Systems  As stated above in HPI Physical Exam Triage Vital Signs ED Triage Vitals  Enc Vitals Group     BP 07/18/21 1856 126/63     Pulse Rate 07/18/21 1856 70     Resp 07/18/21 1856 14     Temp 07/18/21 1856 98.5 F (36.9 C)     Temp Source 07/18/21 1856 Oral     SpO2 07/18/21 1856 100 %     Weight 07/18/21 1852 289 lb 0.4 oz (131.1 kg)     Height 07/18/21 1852 5\' 3"  (1.6 m)     Head Circumference --      Peak Flow --      Pain Score 07/18/21 1852 10     Pain Loc --      Pain Edu? --      Excl. in GC? --    No data found.  Updated Vital Signs BP 126/63 (BP Location: Left Arm)   Pulse 70   Temp 98.5 F (36.9 C) (Oral)    Resp 14   Ht 5\' 3"  (1.6 m)   Wt 289 lb 0.4 oz (131.1 kg)   SpO2 100%   BMI 51.20  kg/m   Physical Exam Vitals and nursing note reviewed.  Constitutional:      General: She is not in acute distress.    Appearance: Normal appearance. She is not ill-appearing, toxic-appearing or diaphoretic.  HENT:     Mouth/Throat:     Mouth: Mucous membranes are moist.     Comments: Anterior crack of tooth #20 with tenderness to palpation and erythema of gums. No inferior palate edema, bogginess or tenderness  Cardiovascular:     Rate and Rhythm: Normal rate and regular rhythm.     Pulses: Normal pulses.     Heart sounds: Normal heart sounds.  Pulmonary:     Effort: Pulmonary effort is normal.     Breath sounds: Normal breath sounds.  Musculoskeletal:     Cervical back: Normal range of motion and neck supple.  Lymphadenopathy:     Cervical: Cervical adenopathy present.  Neurological:     Mental Status: She is alert.     UC Treatments / Results  Labs (all labs ordered are listed, but only abnormal results are displayed) Labs Reviewed - No data to display  EKG   Radiology No results found.  Procedures Procedures (including critical care time)  Medications Ordered in UC Medications  acetaminophen (TYLENOL) tablet 1,000 mg (1,000 mg Oral Given 07/18/21 1901)    Initial Impression / Assessment and Plan / UC Course  I have reviewed the triage vital signs and the nursing notes.  Pertinent labs & imaging results that were available during my care of the patient were reviewed by me and considered in my medical decision making (see chart for details).     New.  Treating for dental pain and infection with clindamycin and Toradol to prevent further complications such as Ludwick's angina.  Discussed how to use these medications along with common potential side effects and precautions.  We also discussed red flag signs and symptoms.  She will keep her dental visit for November 1 Final  Clinical Impressions(s) / UC Diagnoses   Final diagnoses:  None   Discharge Instructions   None    ED Prescriptions   None    PDMP not reviewed this encounter.   Rushie Chestnut, [place] 07/18/21 1920

## 2021-07-18 NOTE — ED Triage Notes (Signed)
Patient c/o cracked tooth on the left lower side for the past 2 days. Patient thinks her tooth is infected.

## 2021-07-21 ENCOUNTER — Encounter: Payer: Self-pay | Admitting: Obstetrics and Gynecology

## 2021-07-21 ENCOUNTER — Other Ambulatory Visit: Payer: Self-pay

## 2021-07-21 ENCOUNTER — Ambulatory Visit (INDEPENDENT_AMBULATORY_CARE_PROVIDER_SITE_OTHER): Payer: 59 | Admitting: Obstetrics and Gynecology

## 2021-07-21 ENCOUNTER — Telehealth: Payer: Self-pay

## 2021-07-21 ENCOUNTER — Encounter: Payer: Self-pay | Admitting: Oncology

## 2021-07-21 ENCOUNTER — Other Ambulatory Visit (HOSPITAL_COMMUNITY)
Admission: RE | Admit: 2021-07-21 | Discharge: 2021-07-21 | Disposition: A | Discharge status: Home / Self Care | Payer: 59 | Source: Ambulatory Visit | Attending: Obstetrics and Gynecology | Admitting: Obstetrics and Gynecology

## 2021-07-21 VITALS — BP 130/70 | Ht 63.0 in | Wt 283.2 lb

## 2021-07-21 DIAGNOSIS — Z538 Procedure and treatment not carried out for other reasons: Secondary | ICD-10-CM

## 2021-07-21 DIAGNOSIS — Z30433 Encounter for removal and reinsertion of intrauterine contraceptive device: Secondary | ICD-10-CM | POA: Diagnosis not present

## 2021-07-21 DIAGNOSIS — Z124 Encounter for screening for malignant neoplasm of cervix: Secondary | ICD-10-CM | POA: Diagnosis present

## 2021-07-21 MED ORDER — DIAZEPAM 2 MG PO TABS: 2.0000 mg | ORAL_TABLET | Freq: Once | ORAL | 0 refills | Status: AC

## 2021-07-21 MED ORDER — MISOPROSTOL 200 MCG PO TABS: ORAL_TABLET | ORAL | 0 refills | Status: DC

## 2021-07-21 NOTE — Telephone Encounter (Signed)
Noted. Will order to arrive by apt date/time. 

## 2021-07-21 NOTE — Progress Notes (Signed)
Patient ID: Sharon Gibson, female   DOB: Apr 16, 1981, 40 y.o.   MRN: [id number]  Reason for Consult: Gynecologic Exam   Referred by Dorcas Carrow, DO  Subjective:     HPI:  Sharon Gibson is a 40 y.o. female. She is here today to exchange her IUD. She has had her current IUD for 8 years. She would like to have a new Mirena IUD placed. Her pap smear is due and she would like to perform that today as well.   Gynecological History  No LMP recorded. (Menstrual status: IUD). Past Medical History:  Diagnosis Date   Anemia    Anxiety    Chicken pox    Depression    Eating disorder    Has had residential treatment and hospitalization previously.   Heart murmur    as a child   History of anemia    RECIEVES IRON INFUSIONS YEARLY   History of pulmonary embolus (PE) 2002   bilaterally-followed by [facility] hematology-w/u negative per pt   Hypothyroidism    no meds currently   PTSD (post-traumatic stress disorder)    Family History  Problem Relation Age of Onset   Depression Father    Diabetes Father    Anxiety disorder Father    Alzheimer's disease Maternal Grandfather    Alzheimer's disease Maternal Grandmother    Prostate cancer Paternal Grandfather    Lung cancer Paternal Grandmother    Past Surgical History:  Procedure Laterality Date   CHOLECYSTECTOMY  2003   EXCISION OF ENDOMETRIOMA     GASTRIC BYPASS  2003   LAPAROSCOPIC OVARIAN CYSTECTOMY Left 05/10/2018   Procedure: LAPAROSCOPIC OVARIAN CYSTECTOMY;  Surgeon: Natale Milch, MD;  Location: [facility] ORS;  Service: Gynecology;  Laterality: Left;    Short Social History:  Social History   Tobacco Use   Smoking status: Never   Smokeless tobacco: Never  Substance Use Topics   Alcohol use: Yes    Alcohol/week: 0.0 - 1.0 standard drinks    Comment: rare    Allergies  Allergen Reactions   Penicillins Hives    Has patient had a PCN reaction causing immediate rash, facial/tongue/throat swelling, SOB or  lightheadedness with hypotension: yes Has patient had a PCN reaction causing severe rash involving mucus membranes or skin necrosis: no Has patient had a PCN reaction that required hospitalization: yes Has patient had a PCN reaction occurring within the last 10 years: no If all of the above answers are "NO", then may proceed with Cephalosporin use.    Azithromycin    Other     Narcotics- Burning in the stomach (whether PO or IV)   Strawberry (Diagnostic) Swelling    AND WALNUTS-TONGUE SWELLING    Current Outpatient Medications  Medication Sig Dispense Refill   clindamycin (CLEOCIN) 300 MG capsule Take 1 capsule (300 mg total) by mouth 3 (three) times daily for 7 days. 21 capsule 0   cloNIDine (CATAPRES) 0.1 MG tablet Take 1 tablet (0.1 mg total) by mouth 2 (two) times daily. (Patient taking differently: Take 0.1 mg by mouth as needed.) 180 tablet 1   Cyanocobalamin (VITAMIN B-12) 1000 MCG SUBL Place 1 tablet (1,000 mcg total) under the tongue daily. 90 tablet 1   doxycycline (VIBRA-TABS) 100 MG tablet Take 1 tablet (100 mg total) by mouth 2 (two) times daily. 20 tablet 0   EPINEPHrine 0.3 mg/0.3 mL IJ SOAJ injection Inject 0.3 mg into the muscle as needed.     gabapentin (NEURONTIN) 100 MG capsule  Take 1 capsule (100 mg total) by mouth at bedtime. 90 capsule 1   hydrOXYzine (ATARAX/VISTARIL) 25 MG tablet Take 1 tablet (25 mg total) by mouth 3 (three) times daily. (Patient taking differently: Take 25 mg by mouth daily.) 270 tablet 1   levonorgestrel (MIRENA) 20 MCG/24HR IUD 1 each by Intrauterine route once.     levothyroxine (SYNTHROID) 125 MCG tablet Take 1 tablet (125 mcg total) by mouth daily. 90 tablet 3   misoprostol (CYTOTEC) 200 MCG tablet Place two tablets in the vagina the night and 2 hours before your next clinic appointment 4 tablet 0   prazosin (MINIPRESS) 1 MG capsule Take 1 capsule (1 mg total) by mouth at bedtime. 90 capsule 1   sertraline (ZOLOFT) 100 MG tablet Take 2.5  tablets (250 mg total) by mouth at bedtime. 225 tablet 1   Vitamin D, Ergocalciferol, (DRISDOL) 1.25 MG (50000 UNIT) CAPS capsule Take 1 capsule (50,000 Units total) by mouth every 7 (seven) days. 12 capsule 1   No current facility-administered medications for this visit.    Review of Systems  Constitutional: Negative for chills, fatigue, fever and unexpected weight change.  HENT: Negative for trouble swallowing.  Eyes: Negative for loss of vision.  Respiratory: Negative for cough, shortness of breath and wheezing.  Cardiovascular: Negative for chest pain, leg swelling, palpitations and syncope.  GI: Negative for abdominal pain, blood in stool, diarrhea, nausea and vomiting.  GU: Negative for difficulty urinating, dysuria, frequency and hematuria.  Musculoskeletal: Negative for back pain, leg pain and joint pain.  Skin: Negative for rash.  Neurological: Negative for dizziness, headaches, light-headedness, numbness and seizures.  Psychiatric: Negative for behavioral problem, confusion, depressed mood and sleep disturbance.       Objective:  Objective   Vitals:   07/21/21 1419  BP: 130/70  Weight: 283 lb 3.2 oz (128.5 kg)  Height: 5\' 3"  (1.6 m)   Body mass index is 50.17 kg/m.  Physical Exam Vitals and nursing note reviewed. Exam conducted with a chaperone present.  Constitutional:      Appearance: Normal appearance. She is well-developed.  HENT:     Head: Normocephalic and atraumatic.  Eyes:     Extraocular Movements: Extraocular movements intact.     Pupils: Pupils are equal, round, and reactive to light.  Cardiovascular:     Rate and Rhythm: Normal rate and regular rhythm.  Pulmonary:     Effort: Pulmonary effort is normal. No respiratory distress.     Breath sounds: Normal breath sounds.  Abdominal:     General: Abdomen is flat.     Palpations: Abdomen is soft.  Genitourinary:    Comments: External: Normal appearing vulva. No lesions noted.  Speculum examination:  Normal appearing cervix. No blood in the vaginal vault. No discharge.   IUD strings seen Musculoskeletal:        General: No signs of injury.  Skin:    General: Skin is warm and dry.  Neurological:     Mental Status: She is alert and oriented to person, place, and time.  Psychiatric:        Behavior: Behavior normal.        Thought Content: Thought content normal.        Judgment: Judgment normal.   Procedure Note:  UD placed approximately 8 years ago. Since that time, she states that she has been happy with the IUD. Prior IUD placements have been challenging in the office. She wishes to have her IUD removed and  replaced with a new  IUD.   Discussed risks of irregular bleeding, cramping, infection, malpositioning or misplacement of the IUD outside the uterus which may require further procedure such as laparoscopy, risk of failure <1%. Time out was performed.   Patient identified, informed consent performed, consent signed.     Speculum placed in the vagina.  Cervix visualized.    IUD Removal Strings of IUD identified and grasped.  IUD removed without problem.  Pt tolerated this well.  IUD noted to be intact.  IUD Placement Cleaned with Betadine x 2.  The cervix was grasped anteriorly with a single tooth tenaculum.  Was able to dilate her cervix, but because of the uterine angle I could not place the IUD despite several attempts. Will have patient return to attempt placement after taking cytotec and pain medication.   Patient was given post-procedure instructions. Patient was also asked to check IUD strings periodically and follow up in 4 weeks for IUD check.   Assessment/Plan:     40 yo here for IUD exchange. IUD removed. New IUD not able to be placed.  Will have patient take cytotec and valium prior to her next visit to try IUD placement. If this is not successful can consider placement in the OR.   More than 20 minutes were spent face to face with the patient in the room,  reviewing the medical record, labs and images, and coordinating care for the patient. The plan of management was discussed in detail and counseling was provided.    Adelene Idler MD [facility], [facility] 07/22/2021 10:26 AM    I

## 2021-07-21 NOTE — Telephone Encounter (Signed)
Patient is scheduled for Mirena placement 08/04/21 at 3:10 with CRS

## 2021-07-21 NOTE — Patient Instructions (Signed)
Intrauterine Device Insertion, Care After This sheet gives you information about how to care for yourself after your procedure. Your health care provider may also give you more specific instructions. If you have problems or questions, contact your health care provider. What can I expect after the procedure? After the procedure, it is common to have: Cramps and pain in the abdomen. Bleeding. It may be light or heavy. This may last for a few days. Lower back pain. Dizziness. Headaches. Nausea. Follow these instructions at home:  Before resuming sexual activity, check to make sure that you can feel the IUD string or strings. You should be able to feel the end of the string below the opening of your cervix. If your IUD string is in place, you may resume sexual activity. If you had a hormonal IUD inserted more than 7 days after your most recent period started, you will need to use a backup method of birth control for 7 days after IUD insertion. Ask your health care provider whether this applies to you. Continue to check that the IUD is still in place by feeling for the strings after every menstrual period, or once a month. An IUD will not protect you from sexually transmitted infections (STIs). Use methods to prevent the exchange of body fluids between partners (barrier protection) every time you have sex. Barrier protection can be used during oral, vaginal, or anal sex. Commonly used barrier methods include: Female condom. Female condom. Dental dam. Take over-the-counter and prescription medicines only as told by your health care provider. Keep all follow-up visits as told by your health care provider. This is important. Contact a health care provider if: You feel light-headed or weak. You have any of the following problems with your IUD string or strings: The string bothers or hurts you or your sexual partner. You cannot feel the string. The string has gotten longer. You can feel the IUD in  your vagina. You think you may be pregnant, or you miss your menstrual period. You think you may have a sexually transmitted infection (STI). Get help right away if: You have flu-like symptoms, such as tiredness (fatigue) and muscle aches. You have a fever and chills. You have bleeding that is heavier or lasts longer than a normal menstrual cycle. You have abnormal or bad-smelling discharge from your vagina. You develop abdominal pain that is new, is getting worse, or is not in the same area of earlier cramping and pain. You have pain during sexual activity. Summary After the procedure, it is common to have cramps and pain in the abdomen. It is also common to have light bleeding or heavier bleeding that is like your menstrual period. Continue to check that the IUD is still in place by feeling for the strings after every menstrual period, or once a month. Keep all follow-up visits as told by your health care provider. This is important. Contact your health care provider if you have problems with your IUD strings, such as the string getting longer or bothering you or your sexual partner. This information is not intended to replace advice given to you by your health care provider. Make sure you discuss any questions you have with your health care provider. Document Revised: 09/05/2019 Document Reviewed: 09/05/2019 Elsevier Patient Education  2022 Elsevier Inc.  

## 2021-07-23 LAB — CYTOLOGY - PAP
Chlamydia: NEGATIVE
Comment: NEGATIVE
Comment: NEGATIVE
Comment: NEGATIVE
Comment: NORMAL
Diagnosis: NEGATIVE
High risk HPV: NEGATIVE
Neisseria Gonorrhea: NEGATIVE
Trichomonas: NEGATIVE

## 2021-08-04 ENCOUNTER — Ambulatory Visit: Payer: 59 | Admitting: Obstetrics and Gynecology

## 2021-08-04 NOTE — Telephone Encounter (Signed)
Patient rescheduled for 09/01/21 at 4:10 with CRS

## 2021-08-05 NOTE — Telephone Encounter (Signed)
Noted  

## 2021-08-20 ENCOUNTER — Telehealth: Payer: Self-pay | Admitting: Family Medicine

## 2021-08-20 NOTE — Telephone Encounter (Signed)
Pharmacy aware

## 2021-08-20 NOTE — Telephone Encounter (Signed)
Copied from CRM [id number]. Topic: Quick Communication - Rx Refill/Question >> Aug 20, 2021 12:13 PM Pawlus, Maxine Glenn A wrote: Pharmacist called in wanted to know if she could change manufactures for levothyroxine (SYNTHROID) 125 MCG tablet, please advise.

## 2021-08-20 NOTE — Telephone Encounter (Signed)
Yes that's fine 

## 2021-09-01 ENCOUNTER — Other Ambulatory Visit: Payer: Self-pay

## 2021-09-01 ENCOUNTER — Encounter: Payer: Self-pay | Admitting: Obstetrics and Gynecology

## 2021-09-01 ENCOUNTER — Encounter: Payer: Self-pay | Admitting: Oncology

## 2021-09-01 ENCOUNTER — Ambulatory Visit (INDEPENDENT_AMBULATORY_CARE_PROVIDER_SITE_OTHER): Payer: BC Managed Care – PPO | Admitting: Obstetrics and Gynecology

## 2021-09-01 VITALS — BP 122/72 | Ht 63.0 in | Wt 278.0 lb

## 2021-09-01 DIAGNOSIS — Z30430 Encounter for insertion of intrauterine contraceptive device: Secondary | ICD-10-CM | POA: Diagnosis not present

## 2021-09-01 DIAGNOSIS — Z538 Procedure and treatment not carried out for other reasons: Secondary | ICD-10-CM | POA: Diagnosis not present

## 2021-09-01 NOTE — Progress Notes (Signed)
  IUD PROCEDURE NOTE:  Sharon Gibson is a 40 y.o. G0P0000 here for IUD insertion. No GYN concerns.  Last pap smear 07/21/2021.  She took cytotec overnight and today.   IUD Insertion Procedure Note Patient identified, informed consent performed, consent signed.   Discussed risks of irregular bleeding, cramping, infection, malpositioning or misplacement of the IUD outside the uterus which may require further procedure such as laparoscopy, risk of failure <1%. Time out was performed.    A bimanual exam showed the uterus to be anteverted.  Speculum placed in the vagina.  Cervix visualized.  Cleaned with Betadine x 2.  Grasped anteriorly with a single tooth tenaculum.  Uterus sounded to 7 cm.   Mirena IUD attempted to be placed per manufacturer's recommendations, but patient asked to discontinue the procedure before the IUD could extend past the cervical os secondary to pain. All instruments removed. Will plan for OR placement under anesthesia.   Adelene Idler MD, [facility], [facility] 09/01/2021 4:38 PM

## 2021-09-02 NOTE — Telephone Encounter (Signed)
09/01/2021- Mirena not rcvd. Mirena device was opened and attempted/charged.   Mirena IUD attempted to be placed per manufacturer's recommendations, but patient asked to discontinue the procedure before the IUD could extend past the cervical os secondary to pain. All instruments removed. Will plan for OR placement under anesthesia.

## 2021-09-08 ENCOUNTER — Telehealth: Payer: Self-pay

## 2021-09-15 ENCOUNTER — Encounter: Payer: Self-pay | Admitting: Oncology

## 2021-09-15 ENCOUNTER — Ambulatory Visit (INDEPENDENT_AMBULATORY_CARE_PROVIDER_SITE_OTHER): Payer: BC Managed Care – PPO | Admitting: Family Medicine

## 2021-09-15 ENCOUNTER — Other Ambulatory Visit: Payer: Self-pay

## 2021-09-15 ENCOUNTER — Encounter: Payer: Self-pay | Admitting: Family Medicine

## 2021-09-15 VITALS — BP 97/63 | HR 66 | Temp 99.3°F

## 2021-09-15 DIAGNOSIS — F411 Generalized anxiety disorder: Secondary | ICD-10-CM | POA: Diagnosis not present

## 2021-09-15 DIAGNOSIS — F332 Major depressive disorder, recurrent severe without psychotic features: Secondary | ICD-10-CM

## 2021-09-15 DIAGNOSIS — R21 Rash and other nonspecific skin eruption: Secondary | ICD-10-CM | POA: Diagnosis not present

## 2021-09-15 IMAGING — DX DG FINGER INDEX 2+V*L*
3 series · 3 of 3 positions shown · non-contrast
Comparison: None.

CLINICAL DATA: Pain, injury, question foreign body

EXAM:
LEFT INDEX FINGER 2+V

[finger ap]
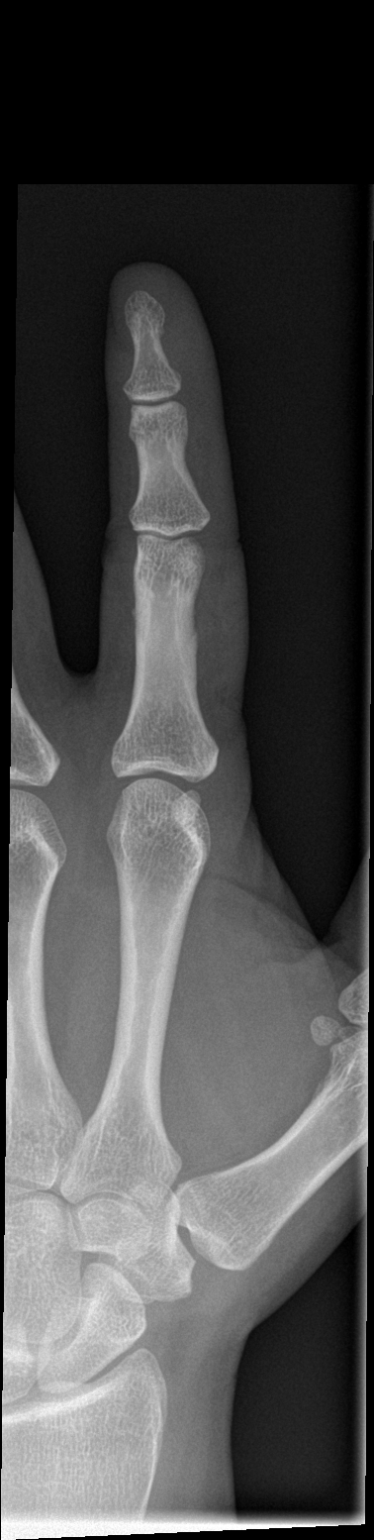

[finger obl]
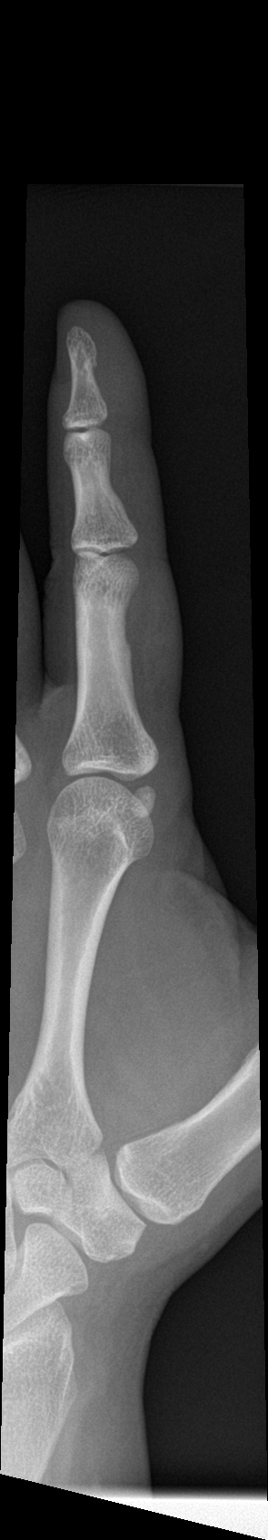

[finger lat]
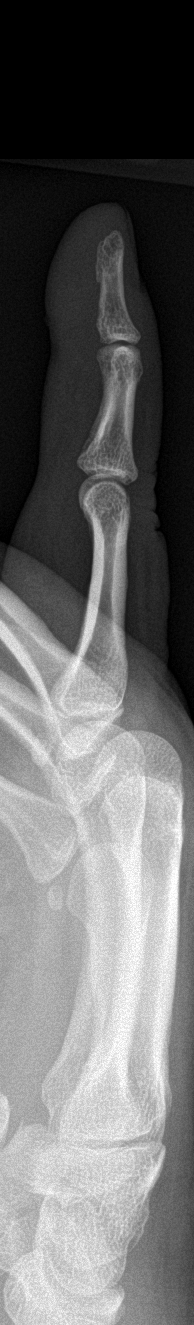

[3 of 3 positions shown; findings below may reference images not displayed]

FINDINGS: There is no evidence of fracture or dislocation. There is no
evidence of arthropathy or other focal bone abnormality. Soft
tissues are unremarkable. No radiopaque foreign body.
IMPRESSION: Negative.

## 2021-09-15 MED ORDER — TRIAMCINOLONE ACETONIDE 0.5 % EX OINT: 1.0000 "application " | TOPICAL_OINTMENT | Freq: Two times a day (BID) | CUTANEOUS | 3 refills | Status: DC

## 2021-09-15 MED ORDER — GABAPENTIN 100 MG PO CAPS: 100.0000 mg | ORAL_CAPSULE | Freq: Every day | ORAL | 1 refills | Status: DC

## 2021-09-15 NOTE — Progress Notes (Signed)
BP 97/63    Pulse 66    Temp 99.3 F (37.4 C)    SpO2 97%    Subjective:    Patient ID: Sharon Gibson, female    DOB: 13-Jun-1981, 40 y.o.   MRN: [id number]  HPI: Sharon Gibson is a 40 y.o. female  Chief Complaint  Patient presents with   Depression   Post-Traumatic Stress Disorder   Rash    Patient states she has developed a rash about 3 weeks ago on her legs    Anxiety    Patient states she thinks she had hypothermia and since then has been having shortness of breath sometimes, patient is not sure if it is anxiety   RASH- chronic on her arms. Stopped her gabapentin and it came back, now on her legs as well Duration:  months  Location: arms and legs  Itching: yes Burning: no Redness: yes Oozing: no Scaling: no Blisters: no Painful: no Fevers: no Change in detergents/soaps/personal care products: no Recent illness: no Recent travel:no History of same: yes Context: worse Alleviating factors: gabapentin Treatments attempted:lotion Shortness of breath: no  Throat/tongue swelling: no Myalgias/arthralgias: no  DEPRESSION- her dog is dying and she is very upset about it. She is staying with her parents so she is not alone, but knows this is going to be a trigger. She has not seen the psychiatrist yet.  Mood status: exacerbated Satisfied with current treatment?: yes Symptom severity: severe  Duration of current treatment : chronic Side effects: no Medication compliance: excellent compliance Psychotherapy/counseling: yes current Depressed mood: yes Anxious mood: yes Anhedonia: no Significant weight loss or gain: no Insomnia: yes hard to fall asleep Fatigue: yes Feelings of worthlessness or guilt: yes Impaired concentration/indecisiveness: yes Suicidal ideations: yes Hopelessness: yes Crying spells: yes Depression screen [facility] 2/9 09/15/2021 06/16/2021 05/05/2021 04/03/2021 11/11/2020  Decreased Interest 2 3 3 3  0  Down, Depressed, Hopeless 2 3 3 3  0  PHQ - 2  Score 4 6 6 6  0  Altered sleeping 3 3 3 3  0  Tired, decreased energy 3 3 3 3  0  Change in appetite 3 2 1 3  0  Feeling bad or failure about yourself  2 3 3 3  0  Trouble concentrating 2 3 3 3  0  Moving slowly or fidgety/restless 0 1 0 2 0  Suicidal thoughts 2 2 3 3  0  PHQ-9 Score 19 23 22 26  0  Difficult doing work/chores - Extremely dIfficult Extremely dIfficult Extremely dIfficult -  Some encounter information is confidential and restricted. Go to Review Flowsheets activity to see all data.  Some recent data might be hidden    Relevant past medical, surgical, family and social history reviewed and updated as indicated. Interim medical history since our last visit reviewed. Allergies and medications reviewed and updated.  Review of Systems  Constitutional: Negative.   HENT: Negative.    Respiratory:  Positive for chest tightness and shortness of breath. Negative for apnea, cough, choking, wheezing and stridor.   Cardiovascular: Negative.   Gastrointestinal: Negative.   Musculoskeletal: Negative.   Skin: Negative.   Psychiatric/Behavioral:  Positive for dysphoric mood. Negative for agitation, behavioral problems, confusion, decreased concentration, hallucinations, self-injury, sleep disturbance and suicidal ideas. The patient is nervous/anxious. The patient is not hyperactive.    Per HPI unless specifically indicated above     Objective:    BP 97/63    Pulse 66    Temp 99.3 F (37.4 C)    SpO2 97%  Wt Readings from Last 3 Encounters:  09/01/21 278 lb (126.1 kg)  07/21/21 283 lb 3.2 oz (128.5 kg)  07/18/21 289 lb 0.4 oz (131.1 kg)    Physical Exam Vitals and nursing note reviewed.  Constitutional:      General: She is not in acute distress.    Appearance: Normal appearance. She is not ill-appearing, toxic-appearing or diaphoretic.  HENT:     Head: Normocephalic and atraumatic.     Right Ear: External ear normal.     Left Ear: External ear normal.     Nose: Nose normal.      Mouth/Throat:     Mouth: Mucous membranes are moist.     Pharynx: Oropharynx is clear.  Eyes:     General: No scleral icterus.       Right eye: No discharge.        Left eye: No discharge.     Extraocular Movements: Extraocular movements intact.     Conjunctiva/sclera: Conjunctivae normal.     Pupils: Pupils are equal, round, and reactive to light.  Cardiovascular:     Rate and Rhythm: Normal rate and regular rhythm.     Pulses: Normal pulses.     Heart sounds: Normal heart sounds. No murmur heard.   No friction rub. No gallop.  Pulmonary:     Effort: Pulmonary effort is normal. No respiratory distress.     Breath sounds: Normal breath sounds. No stridor. No wheezing, rhonchi or rales.  Chest:     Chest wall: No tenderness.  Musculoskeletal:        General: Normal range of motion.     Cervical back: Normal range of motion and neck supple.  Skin:    General: Skin is warm and dry.     Capillary Refill: Capillary refill takes less than 2 seconds.     Coloration: Skin is not jaundiced or pale.     Findings: No bruising, erythema, lesion or rash.  Neurological:     General: No focal deficit present.     Mental Status: She is alert and oriented to person, place, and time. Mental status is at baseline.  Psychiatric:        Mood and Affect: Mood normal.        Behavior: Behavior normal.        Thought Content: Thought content normal.        Judgment: Judgment normal.    Results for orders placed or performed in visit on 07/21/21  Cytology - PAP  Result Value Ref Range   High risk HPV Negative    Neisseria Gonorrhea Negative    Chlamydia Negative    Trichomonas Negative    Adequacy      Satisfactory for evaluation; transformation zone component PRESENT.   Diagnosis      - Negative for intraepithelial lesion or malignancy (NILM)   Comment Normal Reference Range HPV - Negative    Comment Normal Reference Range Trichomonas - Negative    Comment Normal Reference Ranger  Chlamydia - Negative    Comment      Normal Reference Range Neisseria Gonorrhea - Negative      Assessment & Plan:   Problem List Items Addressed This Visit       Musculoskeletal and Integument   Rash    Will restart her gabapentin and use triamcinalone PRN. Call with any concerns. Continue to monitor.         Other   Severe recurrent major depression without psychotic features (  HCC) - Primary    Not doing great right now. Has support system in place. Will continue to monitor closely and try to get her into psychiatry again. Call with any concerns. Continue to monitor.       Generalized anxiety disorder    Not doing great right now. Has support system in place. Will continue to monitor closely and try to get her into psychiatry again. Call with any concerns. Continue to monitor.         Follow up plan: Return 1-2 months.

## 2021-09-15 NOTE — Assessment & Plan Note (Signed)
Will restart her gabapentin and use triamcinalone PRN. Call with any concerns. Continue to monitor.

## 2021-09-15 NOTE — Assessment & Plan Note (Signed)
Not doing great right now. Has support system in place. Will continue to monitor closely and try to get her into psychiatry again. Call with any concerns. Continue to monitor.

## 2021-09-24 ENCOUNTER — Encounter: Payer: Self-pay | Admitting: Oncology

## 2021-09-30 ENCOUNTER — Encounter: Payer: Self-pay | Admitting: Oncology

## 2021-09-30 ENCOUNTER — Encounter
Admission: RE | Admit: 2021-09-30 | Discharge: 2021-09-30 | Disposition: A | Discharge status: Home / Self Care | Payer: BC Managed Care – PPO | Source: Ambulatory Visit | Attending: Obstetrics and Gynecology | Admitting: Obstetrics and Gynecology

## 2021-09-30 ENCOUNTER — Encounter: Payer: Self-pay | Admitting: *Deleted

## 2021-09-30 ENCOUNTER — Other Ambulatory Visit: Payer: Self-pay

## 2021-09-30 NOTE — Patient Instructions (Signed)
Your procedure is scheduled on: Thurs 1/12 Report to Day Surgery. To find out your arrival time please call [phone] between 1PM - 3PM on .  Remember: Instructions that are not followed completely may result in serious medical risk,  up to and including death, or upon the discretion of your surgeon and anesthesiologist your  surgery may need to be rescheduled.     _X__ 1. Do not eat food after midnight the night before your procedure.                 No chewing gum or hard candies. You may drink clear liquids up to 2 hours                 before you are scheduled to arrive for your surgery- DO not drink clear                 liquids within 2 hours of the start of your surgery.                 Clear Liquids include:  water, apple juice without pulp, clear Gatorade, G2 or                  Gatorade Zero (avoid Red/Purple/Blue), Black Coffee or Tea (Do not add                 anything to coffee or tea).  __X__2.  On the morning of surgery brush your teeth with toothpaste and water, you                may rinse your mouth with mouthwash if you wish.  Do not swallow any toothpaste of mouthwash.     __ 3.  No Alcohol for 24 hours before or after surgery.   ___ 4.  Do Not Smoke or use e-cigarettes For 24 Hours Prior to Your Surgery.                 Do not use any chewable tobacco products for at least 6 hours prior to                 Surgery.  _  5.  Do not use any recreational drugs (marijuana, cocaine, heroin, ecstasy, MDMA or other) For at least one week prior to your surgery.       Combination of these drugs with anesthesia may have life threatening results.  ____  6.  Bring all medications with you on the day of surgery if instructed.   ___x_  7.  Notify your doctor if there is any change in your medical condition      (cold, fever, infections).     Do not wear jewelry, make-up, hairpins, clips or nail polish. Do not wear lotions, powders, or perfumes. You  may wear deodorant. Do not shave 48 hours prior to surgery.  Do not bring valuables to the hospital.    [facility] is not responsible for any belongings or valuables.  Contacts, dentures or bridgework may not be worn into surgery. Leave your suitcase in the car. After surgery it may be brought to your room. For patients admitted to the hospital, discharge time is determined by your treatment team.   Patients discharged the day of surgery will not be allowed to drive home.   Make arrangements for someone to be with you for the first 24 hours of your Same Day Discharge.    Please read over the following fact  sheets that you were given:       __x__ Take these medicines the morning of surgery with A SIP OF WATER:    1.cloNIDine (CATAPRES) 0.1 MG tablet  if needed  2. levothyroxine (SYNTHROID) 125 MCG tablet  3.   4.  5.  6.  ____ Fleet Enema (as directed)   __x__ Shower the night before and the morning of surgery   ____ Use Benzoyl Peroxide Gel as instructed  ____ Use inhalers on the day of surgery  ____ Stop metformin 2 days prior to surgery    ____ Take 1/2 of usual insulin dose the night before surgery. No insulin the morning          of surgery.   ____ Stop Coumadin/Plavix/aspirin on   ____ Stop Anti-inflammatories on    __x__ Stop supplements Cyanocobalamin (VITAMIN B-12) 1000 MCG SUBL 1 week prior  ____ Bring C-Pap to the hospital.    If you have any questions regarding your pre-procedure instructions,  Please call Pre-admit Testing at [phone]

## 2021-10-09 ENCOUNTER — Encounter: Payer: Self-pay | Admitting: Family Medicine

## 2021-10-09 ENCOUNTER — Ambulatory Visit
Admission: RE | Admit: 2021-10-09 | Payer: BC Managed Care – PPO | Source: Home / Self Care | Admitting: Obstetrics and Gynecology

## 2021-10-09 ENCOUNTER — Encounter: Admission: RE | Payer: Self-pay | Source: Home / Self Care

## 2021-10-09 SURGERY — INSERTION, INTRAUTERINE DEVICE
Anesthesia: Choice

## 2021-10-28 NOTE — Telephone Encounter (Signed)
09/08/21 LMOM for pt to ret call Tamala Julian, [place])

## 2021-10-30 ENCOUNTER — Encounter: Payer: Self-pay | Admitting: Family Medicine

## 2021-11-05 ENCOUNTER — Telehealth (INDEPENDENT_AMBULATORY_CARE_PROVIDER_SITE_OTHER): Payer: BC Managed Care – PPO | Admitting: Family Medicine

## 2021-11-05 ENCOUNTER — Encounter: Payer: Self-pay | Admitting: Family Medicine

## 2021-11-05 DIAGNOSIS — M791 Myalgia, unspecified site: Secondary | ICD-10-CM

## 2021-11-05 DIAGNOSIS — F332 Major depressive disorder, recurrent severe without psychotic features: Secondary | ICD-10-CM | POA: Diagnosis not present

## 2021-11-05 MED ORDER — VITAMIN D (ERGOCALCIFEROL) 1.25 MG (50000 UNIT) PO CAPS: 50000.0000 [IU] | ORAL_CAPSULE | ORAL | 1 refills | Status: DC

## 2021-11-05 MED ORDER — CLONIDINE HCL 0.1 MG PO TABS: 0.1000 mg | ORAL_TABLET | Freq: Two times a day (BID) | ORAL | 1 refills | Status: DC | PRN

## 2021-11-05 MED ORDER — PRAZOSIN HCL 1 MG PO CAPS: 1.0000 mg | ORAL_CAPSULE | Freq: Every day | ORAL | 1 refills | Status: DC

## 2021-11-05 MED ORDER — HYDROXYZINE HCL 25 MG PO TABS: 25.0000 mg | ORAL_TABLET | Freq: Every day | ORAL | 1 refills | Status: DC

## 2021-11-05 MED ORDER — SERTRALINE HCL 100 MG PO TABS: 250.0000 mg | ORAL_TABLET | Freq: Every day | ORAL | 1 refills | Status: DC

## 2021-11-05 NOTE — Assessment & Plan Note (Signed)
Under good control on current regimen. Continue current regimen. Continue to monitor. Call with any concerns. Refills given. Follow up in 2-3 months.

## 2021-11-05 NOTE — Progress Notes (Signed)
There were no vitals taken for this visit.   Subjective:    Patient ID: Sharon Gibson, female    DOB: 20-Feb-1981, 41 y.o.   MRN: [id number]  HPI: Sharon Gibson is a 41 y.o. female  No chief complaint on file.  ANXIETY/DEPRESSION- her dog is still really sick, but hanging in there. Still following with her counselor. Has been having a lot of trouble waking up in the AM Duration: chronic Status:stable Anxious mood: yes  Excessive worrying: yes Irritability: yes  Sweating: no Nausea: yes Palpitations:yes Hyperventilation: no Panic attacks: yes Agoraphobia: no  Obscessions/compulsions: yes Depressed mood: yes Depression screen [facility] 2/9 11/05/2021 09/15/2021 06/16/2021 05/05/2021 04/03/2021  Decreased Interest 1 2 3 3 3   Down, Depressed, Hopeless 1 2 3 3 3   PHQ - 2 Score 2 4 6 6 6   Altered sleeping 3 3 3 3 3   Tired, decreased energy 3 3 3 3 3   Change in appetite 3 3 2 1 3   Feeling bad or failure about yourself  3 2 3 3 3   Trouble concentrating 3 2 3 3 3   Moving slowly or fidgety/restless 0 0 1 0 2  Suicidal thoughts 1 2 2 3 3   PHQ-9 Score 18 19 23 22 26   Difficult doing work/chores Extremely dIfficult - Extremely dIfficult Extremely dIfficult Extremely dIfficult  Some encounter information is confidential and restricted. Go to Review Flowsheets activity to see all data.  Some recent data might be hidden   GAD 7 : Generalized Anxiety Score 11/05/2021 09/15/2021 06/16/2021 05/05/2021  Nervous, Anxious, on Edge 3 3 3 3   Control/stop worrying 3 3 3 3   Worry too much - different things 3 3 3 3   Trouble relaxing 3 3 3 3   Restless 3 3 3 3   Easily annoyed or irritable 1 3 1 1   Afraid - awful might happen 3 3 3 3   Total GAD 7 Score 19 21 19 19   Anxiety Difficulty Extremely difficult Very difficult Extremely difficult Extremely difficult   Anhedonia: no Weight changes: no Insomnia: no   Hypersomnia: no Fatigue/loss of energy: yes Feelings of worthlessness: yes Feelings of  guilt: yes Impaired concentration/indecisiveness: no Suicidal ideations: no  Crying spells: yes Recent Stressors/Life Changes: yes   Relationship problems: no   Family stress: yes     Financial stress: no    Job stress: yes    Recent death/loss: no   Relevant past medical, surgical, family and social history reviewed and updated as indicated. Interim medical history since our last visit reviewed. Allergies and medications reviewed and updated.  Review of Systems  Constitutional: Negative.   Respiratory: Negative.    Cardiovascular: Negative.   Gastrointestinal: Negative.   Musculoskeletal:  Positive for myalgias. Negative for arthralgias, back pain, gait problem, joint swelling, neck pain and neck stiffness.  Skin: Negative.   Neurological: Negative.   Psychiatric/Behavioral:  Positive for dysphoric mood. Negative for agitation, behavioral problems, confusion, decreased concentration, hallucinations, self-injury, sleep disturbance and suicidal ideas. The patient is nervous/anxious. The patient is not hyperactive.    Per HPI unless specifically indicated above     Objective:    There were no vitals taken for this visit.  Wt Readings from Last 3 Encounters:  09/30/21 278 lb (126.1 kg)  09/01/21 278 lb (126.1 kg)  07/21/21 283 lb 3.2 oz (128.5 kg)    Physical Exam Vitals and nursing note reviewed.  Constitutional:      General: She is not in acute distress.  Appearance: Normal appearance. She is not ill-appearing, toxic-appearing or diaphoretic.  HENT:     Head: Normocephalic and atraumatic.     Right Ear: External ear normal.     Left Ear: External ear normal.     Nose: Nose normal.     Mouth/Throat:     Mouth: Mucous membranes are moist.     Pharynx: Oropharynx is clear.  Eyes:     General: No scleral icterus.       Right eye: No discharge.        Left eye: No discharge.     Conjunctiva/sclera: Conjunctivae normal.     Pupils: Pupils are equal, round, and  reactive to light.  Pulmonary:     Effort: Pulmonary effort is normal. No respiratory distress.     Comments: Speaking in full sentences Musculoskeletal:        General: Normal range of motion.     Cervical back: Normal range of motion.  Skin:    Coloration: Skin is not jaundiced or pale.     Findings: No bruising, erythema, lesion or rash.  Neurological:     Mental Status: She is alert and oriented to person, place, and time. Mental status is at baseline.  Psychiatric:        Mood and Affect: Mood normal.        Behavior: Behavior normal.        Thought Content: Thought content normal.        Judgment: Judgment normal.    Results for orders placed or performed in visit on 07/21/21  Cytology - PAP  Result Value Ref Range   High risk HPV Negative    Neisseria Gonorrhea Negative    Chlamydia Negative    Trichomonas Negative    Adequacy      Satisfactory for evaluation; transformation zone component PRESENT.   Diagnosis      - Negative for intraepithelial lesion or malignancy (NILM)   Comment Normal Reference Range HPV - Negative    Comment Normal Reference Range Trichomonas - Negative    Comment Normal Reference Ranger Chlamydia - Negative    Comment      Normal Reference Range Neisseria Gonorrhea - Negative      Assessment & Plan:   Problem List Items Addressed This Visit       Other   Severe recurrent major depression without psychotic features (HCC) - Primary    Under good control on current regimen. Continue current regimen. Continue to monitor. Call with any concerns. Refills given. Follow up in 2-3 months.         Other Visit Diagnoses     Myalgia       Would benefit from massage for both stress and myalgias. Will provide letter of medical necessity so she can use her FSA.        Follow up plan: Return 2-3 months, virtual OK.   This visit was completed via video visit through MyChart due to the restrictions of the COVID-19 pandemic. All issues as above  were discussed and addressed. Physical exam was done as above through visual confirmation on video through MyChart. If it was felt that the patient should be evaluated in the office, they were directed there. The patient verbally consented to this visit. Location of the patient: work Location of the provider: work Those involved with this call:  Provider: Olevia Perches, DO CMA: Wilhemena Durie, CMA Front Desk/Registration: [employer]  Time spent on call:  15 minutes with patient  face to face via video conference. More than 50% of this time was spent in counseling and coordination of care. 23 minutes total spent in review of patient's record and preparation of their chart.

## 2021-11-06 ENCOUNTER — Encounter: Payer: BC Managed Care – PPO | Admitting: Obstetrics and Gynecology

## 2021-11-11 ENCOUNTER — Other Ambulatory Visit: Payer: Self-pay | Admitting: *Deleted

## 2021-11-11 DIAGNOSIS — D509 Iron deficiency anemia, unspecified: Secondary | ICD-10-CM

## 2021-11-12 ENCOUNTER — Other Ambulatory Visit: Payer: BC Managed Care – PPO

## 2021-11-13 ENCOUNTER — Ambulatory Visit: Payer: BC Managed Care – PPO | Admitting: Oncology

## 2021-11-13 ENCOUNTER — Ambulatory Visit: Payer: BC Managed Care – PPO

## 2021-11-17 ENCOUNTER — Inpatient Hospital Stay: Payer: BC Managed Care – PPO | Attending: Oncology

## 2021-11-17 ENCOUNTER — Other Ambulatory Visit: Payer: Self-pay

## 2021-11-17 DIAGNOSIS — D508 Other iron deficiency anemias: Secondary | ICD-10-CM | POA: Diagnosis present

## 2021-11-17 DIAGNOSIS — D509 Iron deficiency anemia, unspecified: Secondary | ICD-10-CM

## 2021-11-17 DIAGNOSIS — E538 Deficiency of other specified B group vitamins: Secondary | ICD-10-CM | POA: Insufficient documentation

## 2021-11-17 LAB — CBC WITH DIFFERENTIAL/PLATELET
Abs Immature Granulocytes: 0.03 10*3/uL (ref 0.00–0.07)
Basophils Absolute: 0.1 10*3/uL (ref 0.0–0.1)
Basophils Relative: 1 %
Eosinophils Absolute: 0.2 10*3/uL (ref 0.0–0.5)
Eosinophils Relative: 2 %
HCT: 41.8 % (ref 36.0–46.0)
Hemoglobin: 13.6 g/dL (ref 12.0–15.0)
Immature Granulocytes: 0 %
Lymphocytes Relative: 23 %
Lymphs Abs: 2.1 10*3/uL (ref 0.7–4.0)
MCH: 28.7 pg (ref 26.0–34.0)
MCHC: 32.5 g/dL (ref 30.0–36.0)
MCV: 88.2 fL (ref 80.0–100.0)
Monocytes Absolute: 0.5 10*3/uL (ref 0.1–1.0)
Monocytes Relative: 5 %
Neutro Abs: 6.3 10*3/uL (ref 1.7–7.7)
Neutrophils Relative %: 69 %
Platelets: 279 10*3/uL (ref 150–400)
RBC: 4.74 MIL/uL (ref 3.87–5.11)
RDW: 13.2 % (ref 11.5–15.5)
WBC: 9.2 10*3/uL (ref 4.0–10.5)
nRBC: 0 % (ref 0.0–0.2)

## 2021-11-17 LAB — IRON AND TIBC
Iron: 58 ug/dL (ref 28–170)
Saturation Ratios: 16 % (ref 10.4–31.8)
TIBC: 358 ug/dL (ref 250–450)
UIBC: 300 ug/dL

## 2021-11-17 LAB — FERRITIN: Ferritin: 28 ng/mL (ref 11–307)

## 2021-11-17 LAB — VITAMIN B12: Vitamin B-12: 493 pg/mL (ref 180–914)

## 2021-11-18 ENCOUNTER — Inpatient Hospital Stay: Payer: BC Managed Care – PPO | Admitting: Oncology

## 2021-11-18 ENCOUNTER — Encounter: Payer: Self-pay | Admitting: Oncology

## 2021-11-18 ENCOUNTER — Inpatient Hospital Stay: Payer: BC Managed Care – PPO

## 2021-11-18 VITALS — BP 123/78 | HR 79 | Temp 97.4°F | Wt 275.0 lb

## 2021-11-18 DIAGNOSIS — E538 Deficiency of other specified B group vitamins: Secondary | ICD-10-CM | POA: Diagnosis not present

## 2021-11-18 DIAGNOSIS — D508 Other iron deficiency anemias: Secondary | ICD-10-CM | POA: Diagnosis not present

## 2021-11-18 DIAGNOSIS — Z9884 Bariatric surgery status: Secondary | ICD-10-CM

## 2021-11-18 DIAGNOSIS — D509 Iron deficiency anemia, unspecified: Secondary | ICD-10-CM

## 2021-11-18 MED ORDER — DIPHENHYDRAMINE HCL 25 MG PO CAPS
50.0000 mg | ORAL_CAPSULE | Freq: Once | ORAL | Status: AC
  Administered 2021-11-18: 50 mg via ORAL
  Filled 2021-11-18: qty 2

## 2021-11-18 MED ORDER — SODIUM CHLORIDE 0.9 % IV SOLN: 200.0000 mg | Freq: Once | INTRAVENOUS | Status: DC

## 2021-11-18 MED ORDER — SODIUM CHLORIDE 0.9 % IV SOLN
Freq: Once | INTRAVENOUS | Status: AC
  Filled 2021-11-18: qty 250

## 2021-11-18 MED ORDER — ACETAMINOPHEN 325 MG PO TABS
650.0000 mg | ORAL_TABLET | Freq: Once | ORAL | Status: AC
  Administered 2021-11-18: 650 mg via ORAL
  Filled 2021-11-18: qty 2

## 2021-11-18 MED ORDER — IRON SUCROSE 20 MG/ML IV SOLN
200.0000 mg | Freq: Once | INTRAVENOUS | Status: AC
  Administered 2021-11-18: 200 mg via INTRAVENOUS
  Filled 2021-11-18: qty 10

## 2021-11-18 MED ORDER — SODIUM CHLORIDE 0.9 % IV SOLN: 10.0000 mg | Freq: Once | INTRAVENOUS | Status: DC

## 2021-11-18 MED ORDER — DEXAMETHASONE SODIUM PHOSPHATE 10 MG/ML IJ SOLN
10.0000 mg | Freq: Once | INTRAMUSCULAR | Status: AC
  Administered 2021-11-18: 10 mg via INTRAVENOUS
  Filled 2021-11-18: qty 1

## 2021-11-18 NOTE — Patient Instructions (Signed)
[facility]  Discharge Instructions: ?Thank you for choosing [facility] to provide your oncology and hematology care.  ?If you have a lab appointment with the Cancer Center, please go directly to the Cancer Center and check in at the registration area. ? ?Wear comfortable clothing and clothing appropriate for easy access to any Portacath or PICC line.  ? ?We strive to give you quality time with your provider. You may need to reschedule your appointment if you arrive late (15 or more minutes).  Arriving late affects you and other patients whose appointments are after yours.  Also, if you miss three or more appointments without notifying the office, you may be dismissed from the clinic at the provider?s discretion.    ?  ?For prescription refill requests, have your pharmacy contact our office and allow 72 hours for refills to be completed.   ? ?Today you received the following chemotherapy and/or immunotherapy agents VENOFER    ?  ?To help prevent nausea and vomiting after your treatment, we encourage you to take your nausea medication as directed. ? ?BELOW ARE SYMPTOMS THAT SHOULD BE REPORTED IMMEDIATELY: ?*FEVER GREATER THAN 100.4 F (38 ?C) OR HIGHER ?*CHILLS OR SWEATING ?*NAUSEA AND VOMITING THAT IS NOT CONTROLLED WITH YOUR NAUSEA MEDICATION ?*UNUSUAL SHORTNESS OF BREATH ?*UNUSUAL BRUISING OR BLEEDING ?*URINARY PROBLEMS (pain or burning when urinating, or frequent urination) ?*BOWEL PROBLEMS (unusual diarrhea, constipation, pain near the anus) ?TENDERNESS IN MOUTH AND THROAT WITH OR WITHOUT PRESENCE OF ULCERS (sore throat, sores in mouth, or a toothache) ?UNUSUAL RASH, SWELLING OR PAIN  ?UNUSUAL VAGINAL DISCHARGE OR ITCHING  ? ?Items with * indicate a potential emergency and should be followed up as soon as possible or go to the Emergency Department if any problems should occur. ? ?Please show the CHEMOTHERAPY ALERT CARD or IMMUNOTHERAPY ALERT CARD at check-in to the  Emergency Department and triage nurse. ? ?Should you have questions after your visit or need to cancel or reschedule your appointment, please contact [facility]  [phone] and follow the prompts.  Office hours are 8:00 a.m. to 4:30 p.m. Monday - Friday. Please note that voicemails left after 4:00 p.m. may not be returned until the following business day.  We are closed weekends and major holidays. You have access to a nurse at all times for urgent questions. Please call the main number to the clinic [phone] and follow the prompts. ? ?For any non-urgent questions, you may also contact your provider using MyChart. We now offer e-Visits for anyone 18 and older to request care online for non-urgent symptoms. For details visit [email or web address]. ?  ?Also download the MyChart app! Go to the app store, search "MyChart", open the app, select [facility], and log in with your MyChart username and password. ? ?Due to Covid, a mask is required upon entering the hospital/clinic. If you do not have a mask, one will be given to you upon arrival. For doctor visits, patients may have 1 support person aged 18 or older with them. For treatment visits, patients cannot have anyone with them due to current Covid guidelines and our immunocompromised population.  ? ?Iron Sucrose Injection ?What is this medication? ?IRON SUCROSE (EYE ern SOO krose) treats low levels of iron (iron deficiency anemia) in people with kidney disease. Iron is a mineral that plays an important role in making red blood cells, which carry oxygen from your lungs to the rest of your body. ?This medicine may   be used for other purposes; ask your health care provider or pharmacist if you have questions. ?COMMON BRAND NAME(S): Venofer ?What should I tell my care team before I take this medication? ?They need to know if you have any of these conditions: ?Anemia not caused by low iron levels ?Heart disease ?High levels of  iron in the blood ?Kidney disease ?Liver disease ?An unusual or allergic reaction to iron, other medications, foods, dyes, or preservatives ?Pregnant or trying to get pregnant ?Breast-feeding ?How should I use this medication? ?This medication is for infusion into a vein. It is given in a hospital or clinic setting. ?Talk to your care team about the use of this medication in children. While this medication may be prescribed for children as young as 2 years for selected conditions, precautions do apply. ?Overdosage: If you think you have taken too much of this medicine contact a poison control center or emergency room at once. ?NOTE: This medicine is only for you. Do not share this medicine with others. ?What if I miss a dose? ?It is important not to miss your dose. Call your care team if you are unable to keep an appointment. ?What may interact with this medication? ?Do not take this medication with any of the following: ?Deferoxamine ?Dimercaprol ?Other iron products ?This medication may also interact with the following: ?Chloramphenicol ?Deferasirox ?This list may not describe all possible interactions. Give your health care provider a list of all the medicines, herbs, non-prescription drugs, or dietary supplements you use. Also tell them if you smoke, drink alcohol, or use illegal drugs. Some items may interact with your medicine. ?What should I watch for while using this medication? ?Visit your care team regularly. Tell your care team if your symptoms do not start to get better or if they get worse. You may need blood work done while you are taking this medication. ?You may need to follow a special diet. Talk to your care team. Foods that contain iron include: whole grains/cereals, dried fruits, beans, or peas, leafy green vegetables, and organ meats (liver, kidney). ?What side effects may I notice from receiving this medication? ?Side effects that you should report to your care team as soon as  possible: ?Allergic reactions--skin rash, itching, hives, swelling of the face, lips, tongue, or throat ?Low blood pressure--dizziness, feeling faint or lightheaded, blurry vision ?Shortness of breath ?Side effects that usually do not require medical attention (report to your care team if they continue or are bothersome): ?Flushing ?Headache ?Joint pain ?Muscle pain ?Nausea ?Pain, redness, or irritation at injection site ?This list may not describe all possible side effects. Call your doctor for medical advice about side effects. You may report side effects to FDA at 1-800-FDA-1088. ?Where should I keep my medication? ?This medication is given in a hospital or clinic and will not be stored at home. ?NOTE: This sheet is a summary. It may not cover all possible information. If you have questions about this medicine, talk to your doctor, pharmacist, or health care provider. ?? 2022 Elsevier/Gold Standard (2021-02-07 00:00:00) ? ?

## 2021-11-18 NOTE — Progress Notes (Signed)
Hematology/Oncology Progress note [phone] [phone]) [phone]      Clinic Day:  11/18/2021  Referring physician: Valerie Roys, DO  Chief Complaint: Sharon Gibson is a 41 y.o. female presents for follow up of  iron and B12 deficiency   PERTINENT HEMATOLOGY HISTORY Patient previously followed up by Dr.Corcoran, patient switched care to me on 05/08/21 Extensive medical record review was performed by me  #Patient has a history of gastric bypass. #Iron deficiency anemia, she has received IV iron (different preparations) x 5 ([place], [place]; [place], [place];  [place], [place]) with reactions requiring Benadryl and steroids. Per patient she had side effects from IV Venofer treatments, including dizziness, lightheadedness, low blood pressure, excess sweating, aches, and pains, nausea, and headache after her infusion. She has received Feraheme treatments as well as Venofer treatments previously.  Most recently Venofer treatments.  Patient gets Benadryl, steroids as premed to minimize adverse reactions.  #She has B12 deficiency.  Patient was previously on monthly vitamin B12 injections. Due to her work, she has not been able to come to get monthly injections.   #History of multiple pulmonary emboli in 2003.  Per patient report, hypercoagulable work-up was negative.  She was on Coumadin x 6 months.  Not currently on anticoagulation.   INTERVAL HISTORY Sharon Gibson is a 41 y.o. female who has above history reviewed by me today presents for follow up visit for management of iron deficiency anemia and vitamin B12 deficiency Patient reports feeling well.  Patient takes sublingual vitamin B12 supplementation.  Tolerates well.  Past Medical History:  Diagnosis Date   Anemia    Anxiety    Chicken pox    Depression    Eating disorder    Has had residential treatment and hospitalization previously.   Heart murmur    as a child   History of anemia    RECIEVES IRON INFUSIONS  YEARLY   History of pulmonary embolus (PE) 2002   bilaterally-followed by [facility] hematology-w/u negative per pt   Hypothyroidism    no meds currently   PTSD (post-traumatic stress disorder)     Past Surgical History:  Procedure Laterality Date   CHOLECYSTECTOMY  2003   EXCISION OF ENDOMETRIOMA     GASTRIC BYPASS  2003   LAPAROSCOPIC OVARIAN CYSTECTOMY Left 05/10/2018   Procedure: LAPAROSCOPIC OVARIAN CYSTECTOMY;  Surgeon: Homero Fellers, MD;  Location: [facility] ORS;  Service: Gynecology;  Laterality: Left;    Family History  Problem Relation Age of Onset   Depression Father    Diabetes Father    Anxiety disorder Father    Alzheimer's disease Maternal Grandfather    Alzheimer's disease Maternal Grandmother    Prostate cancer Paternal Grandfather    Lung cancer Paternal Grandmother     Social History:  reports that she has never smoked. She has never used smokeless tobacco. She reports current alcohol use. She reports that she does not use drugs. She is single, but sexually active and is currently on IUD Mirena for birth control.  The patient is alone today.  Allergies:  Allergies  Allergen Reactions   Penicillins Hives    Has patient had a PCN reaction causing immediate rash, facial/tongue/throat swelling, SOB or lightheadedness with hypotension: yes Has patient had a PCN reaction causing severe rash involving mucus membranes or skin necrosis: no Has patient had a PCN reaction that required hospitalization: yes Has patient had a PCN reaction occurring within the last 10 years: no If all of the above answers are "NO", then  may proceed with Cephalosporin use.    [place] (Diagnostic) Swelling    AND WALNUTS-TONGUE SWELLING   Other Other (See Comments)    Narcotics- Burning in the stomach (whether PO or IV)   Azithromycin Nausea Only    Current Medications: Current Outpatient Medications  Medication Sig Dispense Refill   cloNIDine (CATAPRES) 0.1 MG tablet Take 1 tablet  (0.1 mg total) by mouth 2 (two) times daily as needed (anxiety). 180 tablet 1   Cyanocobalamin (VITAMIN B-12) 1000 MCG SUBL Place 1 tablet (1,000 mcg total) under the tongue daily. 90 tablet 1   EPINEPHrine 0.3 mg/0.3 mL IJ SOAJ injection Inject 0.3 mg into the muscle as needed for anaphylaxis.     gabapentin (NEURONTIN) 100 MG capsule Take 1 capsule (100 mg total) by mouth at bedtime. 90 capsule 1   hydrOXYzine (ATARAX) 25 MG tablet Take 1 tablet (25 mg total) by mouth daily. At night 90 tablet 1   levothyroxine (SYNTHROID) 125 MCG tablet Take 1 tablet (125 mcg total) by mouth daily. 90 tablet 3   prazosin (MINIPRESS) 1 MG capsule Take 1 capsule (1 mg total) by mouth at bedtime. 90 capsule 1   sertraline (ZOLOFT) 100 MG tablet Take 2.5 tablets (250 mg total) by mouth at bedtime. 225 tablet 1   Vitamin D, Ergocalciferol, (DRISDOL) 1.25 MG (50000 UNIT) CAPS capsule Take 1 capsule (50,000 Units total) by mouth every 7 (seven) days. 12 capsule 1   No current facility-administered medications for this visit.    Review of Systems  Constitutional:  Negative for chills, fever and malaise/fatigue.  HENT:  Negative for sore throat.   Eyes:  Negative for redness.  Respiratory:  Negative for cough, shortness of breath and wheezing.   Cardiovascular:  Negative for chest pain, palpitations and leg swelling.  Gastrointestinal:  Negative for abdominal pain, blood in stool, nausea and vomiting.  Genitourinary:  Negative for dysuria.  Musculoskeletal:  Negative for myalgias.  Skin:  Negative for rash.  Neurological:  Negative for dizziness, tingling and tremors.  Endo/Heme/Allergies:  Does not bruise/bleed easily.  Psychiatric/Behavioral:  Negative for hallucinations.   Performance status (ECOG): 1  Vitals Blood pressure 123/78, pulse 79, temperature (!) 97.4 F (36.3 C), temperature source Tympanic, weight 275 lb (124.7 kg).   Physical Exam Constitutional:      General: She is not in acute  distress.    Appearance: She is well-developed. She is not diaphoretic.  HENT:     Head: Normocephalic and atraumatic.     Mouth/Throat:     Mouth: Mucous membranes are moist.     Pharynx: Oropharynx is clear. No oropharyngeal exudate.  Eyes:     General: No scleral icterus.    Pupils: Pupils are equal, round, and reactive to light.  Neck:     Vascular: No JVD.  Cardiovascular:     Rate and Rhythm: Normal rate and regular rhythm.     Heart sounds: Normal heart sounds. No murmur heard. Pulmonary:     Effort: Pulmonary effort is normal. No respiratory distress.     Breath sounds: Normal breath sounds. No wheezing or rales.  Chest:     Chest wall: No tenderness.  Abdominal:     General: Bowel sounds are normal. There is no distension.     Palpations: Abdomen is soft. There is no mass.     Tenderness: There is no abdominal tenderness. There is no guarding or rebound.  Musculoskeletal:        General: No  swelling or tenderness. Normal range of motion.     Cervical back: Normal range of motion and neck supple.  Lymphadenopathy:     Head:     Right side of head: No preauricular, posterior auricular or occipital adenopathy.     Left side of head: No preauricular, posterior auricular or occipital adenopathy.     Cervical: No cervical adenopathy.     Upper Body:     Right upper body: No supraclavicular adenopathy.     Left upper body: No supraclavicular adenopathy.     Lower Body: No right inguinal adenopathy. No left inguinal adenopathy.  Skin:    General: Skin is warm and dry.     Coloration: Skin is not pale.     Findings: No erythema or rash.  Neurological:     Mental Status: She is alert and oriented to person, place, and time.  Psychiatric:        Mood and Affect: Mood normal.   Appointment on 11/17/2021  Component Date Value Ref Range Status   Iron 11/17/2021 58  28 - 170 ug/dL Final   TIBC 11/17/2021 358  250 - 450 ug/dL Final   Saturation Ratios 11/17/2021 16  10.4 -  31.8 % Final   UIBC 11/17/2021 300  ug/dL Final   Performed at [facility], [address] [place], [place] [address]   Vitamin B-12 11/17/2021 493  180 - 914 pg/mL Final   Comment: (NOTE) This assay is not validated for testing neonatal or myeloproliferative syndrome specimens for Vitamin B12 levels. Performed at [facility], [place] [address], [place], [place] [address]    Ferritin 11/17/2021 28  11 - 307 ng/mL Final   Performed at [facility], [address] [place], [place] [address]   WBC 11/17/2021 9.2  4.0 - 10.5 K/uL Final   RBC 11/17/2021 4.74  3.87 - 5.11 MIL/uL Final   Hemoglobin 11/17/2021 13.6  12.0 - 15.0 g/dL Final   HCT 11/17/2021 41.8  36.0 - 46.0 % Final   MCV 11/17/2021 88.2  80.0 - 100.0 fL Final   MCH 11/17/2021 28.7  26.0 - 34.0 pg Final   MCHC 11/17/2021 32.5  30.0 - 36.0 g/dL Final   RDW 11/17/2021 13.2  11.5 - 15.5 % Final   Platelets 11/17/2021 279  150 - 400 K/uL Final   nRBC 11/17/2021 0.0  0.0 - 0.2 % Final   Neutrophils Relative % 11/17/2021 69  % Final   Neutro Abs 11/17/2021 6.3  1.7 - 7.7 K/uL Final   Lymphocytes Relative 11/17/2021 23  % Final   Lymphs Abs 11/17/2021 2.1  0.7 - 4.0 K/uL Final   Monocytes Relative 11/17/2021 5  % Final   Monocytes Absolute 11/17/2021 0.5  0.1 - 1.0 K/uL Final   Eosinophils Relative 11/17/2021 2  % Final   Eosinophils Absolute 11/17/2021 0.2  0.0 - 0.5 K/uL Final   Basophils Relative 11/17/2021 1  % Final   Basophils Absolute 11/17/2021 0.1  0.0 - 0.1 K/uL Final   Immature Granulocytes 11/17/2021 0  % Final   Abs Immature Granulocytes 11/17/2021 0.03  0.00 - 0.07 K/uL Final   Performed at [facility], [address], [place], [place] [address]    Assessment and Plan. 1. Other iron deficiency anemia   2. B12 deficiency   3. History of gastric bypass    #Iron deficiency in the context of history of gastric bypass Labs reviewed and discussed with patient  Hemoglobin is stable.   Iron panel showed ferritin level is within normal limits, trending down. In the context of history of gastric bypass. I recommend patient to proceed with IV Venofer treatment 200 mg x 1 today.She declines pregnancy testing as she is currently not sexually active.  She will get Tylenol 650 mg p.o. x1, Benadryl 50 mg p.o. x1, Decadron 10 mg x 1 as premed prior to Venofer treatment to minimize infusion reactions    #History of vitamin B12 deficiency. B12 level is at 493. I will hold off IM vitamin B12 injections. Continue sublingual vitamin B12 1000 mcg daily. Repeat B12 at the next visit.  RTC in 6 months for MD assess, labs [facility] with diff, ferritin, iron studies, B12] +/- Venofer.  I discussed the assessment and treatment plan with the patient.  The patient was provided an opportunity to ask questions and all were answered.  The patient agreed with the plan and demonstrated an understanding of the instructions.  The patient was advised to call back if the symptoms worsen or if the condition fails to improve as anticipated.  Earlie Server, MD, PhD Hematology Oncology 11/18/2021

## 2022-01-08 ENCOUNTER — Ambulatory Visit
Admission: RE | Admit: 2022-01-08 | Discharge: 2022-01-08 | Disposition: A | Discharge status: Home / Self Care | Payer: BC Managed Care – PPO | Attending: Family Medicine | Admitting: Family Medicine

## 2022-01-08 ENCOUNTER — Ambulatory Visit
Admission: RE | Admit: 2022-01-08 | Discharge: 2022-01-08 | Disposition: A | Discharge status: Home / Self Care | Payer: BC Managed Care – PPO | Source: Ambulatory Visit | Attending: Family Medicine | Admitting: Family Medicine

## 2022-01-08 ENCOUNTER — Ambulatory Visit (INDEPENDENT_AMBULATORY_CARE_PROVIDER_SITE_OTHER): Payer: BC Managed Care – PPO | Admitting: Family Medicine

## 2022-01-08 ENCOUNTER — Encounter: Payer: Self-pay | Admitting: Family Medicine

## 2022-01-08 VITALS — BP 107/68 | HR 69 | Temp 97.9°F

## 2022-01-08 DIAGNOSIS — R202 Paresthesia of skin: Secondary | ICD-10-CM | POA: Diagnosis not present

## 2022-01-08 DIAGNOSIS — Z8659 Personal history of other mental and behavioral disorders: Secondary | ICD-10-CM

## 2022-01-08 DIAGNOSIS — F332 Major depressive disorder, recurrent severe without psychotic features: Secondary | ICD-10-CM | POA: Diagnosis not present

## 2022-01-08 DIAGNOSIS — F411 Generalized anxiety disorder: Secondary | ICD-10-CM

## 2022-01-08 DIAGNOSIS — F4310 Post-traumatic stress disorder, unspecified: Secondary | ICD-10-CM

## 2022-01-08 MED ORDER — CLONAZEPAM 0.5 MG PO TABS: 0.5000 mg | ORAL_TABLET | Freq: Two times a day (BID) | ORAL | 1 refills | Status: DC | PRN

## 2022-01-08 NOTE — Progress Notes (Signed)
? ?BP 107/68   Pulse 69   Temp 97.9 ?F (36.6 ?C)   SpO2 98%   ? ?Subjective:  ? ? Patient ID: Sharon Gibson, female    DOB: 06/10/1981, 41 y.o.   MRN: [id number] ? ?HPI: ?Sharon Gibson is a 41 y.o. female ? ?Chief Complaint  ?Patient presents with  ? Depression  ? Anxiety  ?  Patient states a little less than a year ago she was on Klonopin and was taken off of it, since then has been having panic attacks more frequent up to multiple times a day  ? Numbness  ?  Patient states she has an area on her left thigh that feels numb. Patient states she noticed in a few months ago it was numb on occassions but recently she states it is always numb.   ? ?ANXIETY/DEPRESSION- had to put her dog down 2 weeks ago. Has been having a really hard time. Still going to therapy 2x a week, has been doing some trauma therapy. Has really not been doing well. Not living with her parents right now. Support system isn't as good as it has been ?Duration: chronic ?Status:worse ?Anxious mood: yes  ?Excessive worrying: yes ?Irritability: yes  ?Sweating: no ?Nausea: no ?Palpitations:no ?Hyperventilation: no ?Panic attacks: yes ?Agoraphobia: no  ?Obscessions/compulsions: yes ?Depressed mood: yes ? ?  01/08/2022  ?  2:39 PM 11/05/2021  ?  9:16 AM 09/15/2021  ?  8:18 AM 06/16/2021  ? 11:11 AM 05/05/2021  ?  2:24 PM  ?Depression screen PHQ 2/9  ?Decreased Interest 3 1 2 3 3   ?Down, Depressed, Hopeless 3 1 2 3 3   ?PHQ - 2 Score 6 2 4 6 6   ?Altered sleeping 3 3 3 3 3   ?Tired, decreased energy 3 3 3 3 3   ?Change in appetite 3 3 3 2 1   ?Feeling bad or failure about yourself  3 3 2 3 3   ?Trouble concentrating 3 3 2 3 3   ?Moving slowly or fidgety/restless 3 0 0 1 0  ?Suicidal thoughts 3 1 2 2 3   ?PHQ-9 Score 27 18 19 23 22   ?Difficult doing work/chores  Extremely dIfficult  Extremely dIfficult Extremely dIfficult  ? ?Anhedonia: no ?Weight changes: no ?Insomnia: yes hard to fall asleep  ?Hypersomnia: yes ?Fatigue/loss of energy: yes ?Feelings of  worthlessness: yes ?Feelings of guilt: yes ?Impaired concentration/indecisiveness: yes ?Suicidal ideations: yes  ?Crying spells: yes ?Recent Stressors/Life Changes: yes ?  Relationship problems: yes ?  Family stress: yes   ?  Financial stress: no  ?  Job stress: no  ?  Recent death/loss: yes ? ?NUMBNESS ?Duration: weeks ?Onset: sudden ?Location: lateral side of L leg ?Bilateral: no ?Symmetric: no ?Decreased sensation: yes  ?Weakness: no ?Pain: no ?Quality:  numb and tingling ?Severity: moderate  ?Frequency: constant ?Trauma: no ?Recent illness: no ?Diabetes: no ?Thyroid disease: no  ?HIV: no  ?Alcoholism: no  ?Spinal cord injury: no ?Status: worse ? ? ?Relevant past medical, surgical, family and social history reviewed and updated as indicated. Interim medical history since our last visit reviewed. ?Allergies and medications reviewed and updated. ? ?Review of Systems  ?Constitutional: Negative.   ?Respiratory: Negative.    ?Cardiovascular: Negative.   ?Gastrointestinal: Negative.   ?Skin: Negative.   ?Neurological:  Positive for numbness. Negative for dizziness, tremors, seizures, syncope, facial asymmetry, speech difficulty, weakness, light-headedness and headaches.  ?Psychiatric/Behavioral:  Positive for dysphoric mood and sleep disturbance. Negative for agitation, behavioral problems, confusion, decreased concentration, hallucinations, self-injury and  suicidal ideas. The patient is nervous/anxious. The patient is not hyperactive.   ? ?Per HPI unless specifically indicated above ? ?   ?Objective:  ?  ?BP 107/68   Pulse 69   Temp 97.9 ?F (36.6 ?C)   SpO2 98%   ?Wt Readings from Last 3 Encounters:  ?11/18/21 275 lb (124.7 kg)  ?09/30/21 278 lb (126.1 kg)  ?09/01/21 278 lb (126.1 kg)  ?  ?Physical Exam ?Vitals and nursing note reviewed.  ?Constitutional:   ?   General: She is not in acute distress. ?   Appearance: Normal appearance. She is obese. She is not ill-appearing, toxic-appearing or diaphoretic.  ?HENT:  ?    Head: Normocephalic and atraumatic.  ?   Right Ear: External ear normal.  ?   Left Ear: External ear normal.  ?   Nose: Nose normal.  ?   Mouth/Throat:  ?   Mouth: Mucous membranes are moist.  ?   Pharynx: Oropharynx is clear.  ?Eyes:  ?   General: No scleral icterus.    ?   Right eye: No discharge.     ?   Left eye: No discharge.  ?   Extraocular Movements: Extraocular movements intact.  ?   Conjunctiva/sclera: Conjunctivae normal.  ?   Pupils: Pupils are equal, round, and reactive to light.  ?Cardiovascular:  ?   Rate and Rhythm: Normal rate and regular rhythm.  ?   Pulses: Normal pulses.  ?   Heart sounds: Normal heart sounds. No murmur heard. ?  No friction rub. No gallop.  ?Pulmonary:  ?   Effort: Pulmonary effort is normal. No respiratory distress.  ?   Breath sounds: Normal breath sounds. No stridor. No wheezing, rhonchi or rales.  ?Chest:  ?   Chest wall: No tenderness.  ?Musculoskeletal:     ?   General: Normal range of motion.  ?   Cervical back: Normal range of motion and neck supple.  ?Skin: ?   General: Skin is warm and dry.  ?   Capillary Refill: Capillary refill takes less than 2 seconds.  ?   Coloration: Skin is not jaundiced or pale.  ?   Findings: No bruising, erythema, lesion or rash.  ?Neurological:  ?   General: No focal deficit present.  ?   Mental Status: She is alert and oriented to person, place, and time. Mental status is at baseline.  ?Psychiatric:     ?   Mood and Affect: Mood normal. Affect is tearful.     ?   Behavior: Behavior normal.     ?   Thought Content: Thought content normal.     ?   Judgment: Judgment normal.  ? ? ?Results for orders placed or performed in visit on 11/17/21  ?Iron and TIBC([facility])  ?Result Value Ref Range  ? Iron 58 28 - 170 ug/dL  ? TIBC 358 250 - 450 ug/dL  ? Saturation Ratios 16 10.4 - 31.8 %  ? UIBC 300 ug/dL  ?Vitamin B12  ?Result Value Ref Range  ? Vitamin B-12 493 180 - 914 pg/mL  ?Ferritin  ?Result Value Ref Range  ? Ferritin 28 11 - 307  ng/mL  ?CBC with Differential/Platelet  ?Result Value Ref Range  ? WBC 9.2 4.0 - 10.5 K/uL  ? RBC 4.74 3.87 - 5.11 MIL/uL  ? Hemoglobin 13.6 12.0 - 15.0 g/dL  ? HCT 41.8 36.0 - 46.0 %  ? MCV 88.2 80.0 - 100.0 fL  ? MCH  28.7 26.0 - 34.0 pg  ? MCHC 32.5 30.0 - 36.0 g/dL  ? RDW 13.2 11.5 - 15.5 %  ? Platelets 279 150 - 400 K/uL  ? nRBC 0.0 0.0 - 0.2 %  ? Neutrophils Relative % 69 %  ? Neutro Abs 6.3 1.7 - 7.7 K/uL  ? Lymphocytes Relative 23 %  ? Lymphs Abs 2.1 0.7 - 4.0 K/uL  ? Monocytes Relative 5 %  ? Monocytes Absolute 0.5 0.1 - 1.0 K/uL  ? Eosinophils Relative 2 %  ? Eosinophils Absolute 0.2 0.0 - 0.5 K/uL  ? Basophils Relative 1 %  ? Basophils Absolute 0.1 0.0 - 0.1 K/uL  ? Immature Granulocytes 0 %  ? Abs Immature Granulocytes 0.03 0.00 - 0.07 K/uL  ? ?   ?Assessment & Plan:  ? ?Problem List Items Addressed This Visit   ? ?  ? Other  ? History of eating disorder  ? Relevant Orders  ? Ambulatory referral to Psychiatry  ? PTSD (post-traumatic stress disorder)  ? Relevant Orders  ? Ambulatory referral to Psychiatry  ? Severe recurrent major depression without psychotic features ([place])  ?  Not doing well. Has not been able to get into see psychiatry. New urgent referral placed today. Will start PRN klonopin for severe anxiety. 20 pills given today to be taken sparingly. Recheck 2 weeks. Call with any concerns.  ?  ?  ? Relevant Orders  ? Ambulatory referral to Psychiatry  ? Generalized anxiety disorder  ? Relevant Orders  ? Ambulatory referral to Psychiatry  ? ?Other Visit Diagnoses   ? ? Paresthesias    -  Primary  ? Will check labs and x-ray. Start stretches. Await results. Treat as needed.   ? Relevant Orders  ? Comprehensive metabolic panel  ? CBC with Differential/Platelet  ? Bayer DCA Hb A1c Waived  ? TSH  ? VITAMIN D 25 Hydroxy (Vit-D Deficiency, Fractures)  ? DG Lumbar Spine Complete  ? ?  ?  ? ?Follow up plan: ?Return in about 2 weeks (around 01/22/2022) for virtual OK. ? ? ? ? ? ?

## 2022-01-08 NOTE — Assessment & Plan Note (Signed)
Not doing well. Has not been able to get into see psychiatry. New urgent referral placed today. Will start PRN klonopin for severe anxiety. 20 pills given today to be taken sparingly. Recheck 2 weeks. Call with any concerns.  ?

## 2022-01-09 ENCOUNTER — Other Ambulatory Visit: Payer: BC Managed Care – PPO

## 2022-01-23 ENCOUNTER — Telehealth: Payer: BC Managed Care – PPO | Admitting: Family Medicine

## 2022-01-23 ENCOUNTER — Encounter: Payer: Self-pay | Admitting: Family Medicine

## 2022-01-23 DIAGNOSIS — F332 Major depressive disorder, recurrent severe without psychotic features: Secondary | ICD-10-CM

## 2022-01-23 DIAGNOSIS — G8929 Other chronic pain: Secondary | ICD-10-CM | POA: Insufficient documentation

## 2022-01-23 DIAGNOSIS — M5416 Radiculopathy, lumbar region: Secondary | ICD-10-CM

## 2022-01-23 MED ORDER — BREXPIPRAZOLE 0.25 MG PO TABS: 0.2500 mg | ORAL_TABLET | Freq: Every day | ORAL | 3 refills | Status: DC

## 2022-01-23 NOTE — Progress Notes (Signed)
? ?There were no vitals taken for this visit.  ? ?Subjective:  ? ? Patient ID: Sharon Gibson, female    DOB: 02-Sep-1981, 41 y.o.   MRN: [id number] ? ?HPI: ?Sharon Gibson is a 41 y.o. female ? ?Chief Complaint  ?Patient presents with  ? Depression  ?  Follow up   ? Arthritis  ?  Patient states she would like to discuss PT   ? ?ANXIETY/DEPRESSION ?Duration: chronic ?Status:uncontrolled ?Anxious mood: yes  ?Excessive worrying: yes ?Irritability: yes  ?Sweating: no ?Nausea: no ?Palpitations:no ?Hyperventilation: no ?Panic attacks: yes ?Agoraphobia: no  ?Obscessions/compulsions: no ?Depressed mood: yes ? ?  01/23/2022  ?  4:25 PM 01/08/2022  ?  2:39 PM 11/05/2021  ?  9:16 AM 09/15/2021  ?  8:18 AM 06/16/2021  ? 11:11 AM  ?Depression screen PHQ 2/9  ?Decreased Interest 3 3 1 2 3   ?Down, Depressed, Hopeless 3 3 1 2 3   ?PHQ - 2 Score 6 6 2 4 6   ?Altered sleeping 3 3 3 3 3   ?Tired, decreased energy 3 3 3 3 3   ?Change in appetite 3 3 3 3 2   ?Feeling bad or failure about yourself  2 3 3 2 3   ?Trouble concentrating 3 3 3 2 3   ?Moving slowly or fidgety/restless 2 3 0 0 1  ?Suicidal thoughts 3 3 1 2 2   ?PHQ-9 Score 25 27 18 19 23   ?Difficult doing work/chores Extremely dIfficult  Extremely dIfficult  Extremely dIfficult  ? ?Anhedonia: no ?Weight changes: no ?Insomnia: yes hard to fall asleep  ?Hypersomnia: yes ?Fatigue/loss of energy: yes ?Feelings of worthlessness: yes ?Feelings of guilt: yes ?Impaired concentration/indecisiveness: yes ?Suicidal ideations: yes  ?Crying spells: yes ?Recent Stressors/Life Changes: yes ?  Relationship problems: no ?  Family stress: no   ?  Financial stress: yes  ?  Job stress: yes  ?  Recent death/loss: yes ? ?BACK PAIN ?Duration:  couple of months ?Mechanism of injury: unknown ?Location: lateral side of L leg ?Onset: sudden ?Severity: severe ?Quality: numb, tingling and shooting ?Frequency: constant ?Radiation: L leg below the knee ?Aggravating factors: movement  ?Alleviating factors:  nothing ?Status: stable ?Treatments attempted: stretches  ?Relief with NSAIDs?: No NSAIDs Taken ?Nighttime pain:  no ?Paresthesias / decreased sensation:  yes ?Bowel / bladder incontinence:  no ?Fevers:  no ?Dysuria / urinary frequency:  no ? ?Relevant past medical, surgical, family and social history reviewed and updated as indicated. Interim medical history since our last visit reviewed. ?Allergies and medications reviewed and updated. ? ?Review of Systems  ?Constitutional: Negative.   ?Respiratory: Negative.    ?Cardiovascular: Negative.   ?Gastrointestinal: Negative.   ?Musculoskeletal:  Positive for back pain and myalgias. Negative for arthralgias, gait problem, joint swelling, neck pain and neck stiffness.  ?Skin: Negative.   ?Neurological:  Positive for numbness. Negative for dizziness, tremors, seizures, syncope, facial asymmetry, speech difficulty, weakness, light-headedness and headaches.  ?Psychiatric/Behavioral:  Positive for decreased concentration, dysphoric mood, sleep disturbance and suicidal ideas. Negative for agitation, behavioral problems, confusion, hallucinations and self-injury. The patient is nervous/anxious. The patient is not hyperactive.   ? ?Per HPI unless specifically indicated above ? ?   ?Objective:  ?  ?There were no vitals taken for this visit.  ?Wt Readings from Last 3 Encounters:  ?11/18/21 275 lb (124.7 kg)  ?09/30/21 278 lb (126.1 kg)  ?09/01/21 278 lb (126.1 kg)  ?  ?Physical Exam ?Vitals and nursing note reviewed.  ?Constitutional:   ?   General: She  is not in acute distress. ?   Appearance: Normal appearance. She is not ill-appearing, toxic-appearing or diaphoretic.  ?HENT:  ?   Head: Normocephalic and atraumatic.  ?   Right Ear: External ear normal.  ?   Left Ear: External ear normal.  ?   Nose: Nose normal.  ?   Mouth/Throat:  ?   Mouth: Mucous membranes are moist.  ?   Pharynx: Oropharynx is clear.  ?Eyes:  ?   General: No scleral icterus.    ?   Right eye: No discharge.      ?   Left eye: No discharge.  ?   Conjunctiva/sclera: Conjunctivae normal.  ?   Pupils: Pupils are equal, round, and reactive to light.  ?Pulmonary:  ?   Effort: Pulmonary effort is normal. No respiratory distress.  ?   Comments: Speaking in full sentences ?Musculoskeletal:     ?   General: Normal range of motion.  ?   Cervical back: Normal range of motion.  ?Skin: ?   Coloration: Skin is not jaundiced or pale.  ?   Findings: No bruising, erythema, lesion or rash.  ?Neurological:  ?   Mental Status: She is alert and oriented to person, place, and time. Mental status is at baseline.  ?Psychiatric:     ?   Mood and Affect: Mood normal.     ?   Behavior: Behavior normal.     ?   Thought Content: Thought content normal.     ?   Judgment: Judgment normal.  ? ? ?Results for orders placed or performed in visit on 11/17/21  ?Iron and TIBC([facility])  ?Result Value Ref Range  ? Iron 58 28 - 170 ug/dL  ? TIBC 358 250 - 450 ug/dL  ? Saturation Ratios 16 10.4 - 31.8 %  ? UIBC 300 ug/dL  ?Vitamin B12  ?Result Value Ref Range  ? Vitamin B-12 493 180 - 914 pg/mL  ?Ferritin  ?Result Value Ref Range  ? Ferritin 28 11 - 307 ng/mL  ?CBC with Differential/Platelet  ?Result Value Ref Range  ? WBC 9.2 4.0 - 10.5 K/uL  ? RBC 4.74 3.87 - 5.11 MIL/uL  ? Hemoglobin 13.6 12.0 - 15.0 g/dL  ? HCT 41.8 36.0 - 46.0 %  ? MCV 88.2 80.0 - 100.0 fL  ? MCH 28.7 26.0 - 34.0 pg  ? MCHC 32.5 30.0 - 36.0 g/dL  ? RDW 13.2 11.5 - 15.5 %  ? Platelets 279 150 - 400 K/uL  ? nRBC 0.0 0.0 - 0.2 %  ? Neutrophils Relative % 69 %  ? Neutro Abs 6.3 1.7 - 7.7 K/uL  ? Lymphocytes Relative 23 %  ? Lymphs Abs 2.1 0.7 - 4.0 K/uL  ? Monocytes Relative 5 %  ? Monocytes Absolute 0.5 0.1 - 1.0 K/uL  ? Eosinophils Relative 2 %  ? Eosinophils Absolute 0.2 0.0 - 0.5 K/uL  ? Basophils Relative 1 %  ? Basophils Absolute 0.1 0.0 - 0.1 K/uL  ? Immature Granulocytes 0 %  ? Abs Immature Granulocytes 0.03 0.00 - 0.07 K/uL  ? ?   ?Assessment & Plan:  ? ?Problem List Items  Addressed This Visit   ? ?  ? Nervous and Auditory  ? Lumbar radiculopathy  ?  Lumbar djd on x-ray. Will start PT. Call with any concerns. Recheck 2 weeks.  ? ?  ?  ? Relevant Medications  ? brexpiprazole (REXULTI) 0.25 MG TABS tablet  ? Other Relevant Orders  ?  Ambulatory referral to Physical Therapy  ?  ? Other  ? Severe recurrent major depression without psychotic features ([facility]) - Primary  ?  Not doing well. Awaiting appointment with psych- hasn't heard anything. Will start rexalti to see if it will help. On max dose sertraline. Failed wellbutrin, seroquel and abilify. Checking in on psych referral. Recheck 2 weeks.  ? ?  ?  ?  ? ?Follow up plan: ?Return in about 2 weeks (around 02/06/2022). ? ? ? ?This visit was completed via video visit through MyChart due to the restrictions of the COVID-19 pandemic. All issues as above were discussed and addressed. Physical exam was done as above through visual confirmation on video through MyChart. If it was felt that the patient should be evaluated in the office, they were directed there. The patient verbally consented to this visit. ?Location of the patient: work ?Location of the provider: work ?Those involved with this call:  ?Provider: Olevia Perches, DO ?CMA: Rolley Sims, CMA ?Front Desk/Registration: [employer]  ?Time spent on call:  25 minutes with patient face to face via video conference. More than 50% of this time was spent in counseling and coordination of care. 40 minutes total spent in review of patient's record and preparation of their chart. ? ? ? ?

## 2022-01-23 NOTE — Assessment & Plan Note (Signed)
Not doing well. Awaiting appointment with psych- hasn't heard anything. Will start rexalti to see if it will help. On max dose sertraline. Failed wellbutrin, seroquel and abilify. Checking in on psych referral. Recheck 2 weeks.  ?

## 2022-01-23 NOTE — Assessment & Plan Note (Signed)
Lumbar djd on x-ray. Will start PT. Call with any concerns. Recheck 2 weeks.  ?

## 2022-01-26 NOTE — Progress Notes (Signed)
LVM to schedule 2 week follow up  ?

## 2022-01-27 NOTE — Progress Notes (Signed)
Pt scheduled  

## 2022-01-28 ENCOUNTER — Telehealth: Payer: Self-pay

## 2022-01-28 NOTE — Telephone Encounter (Signed)
PA initiated via CoverMyMeds for Rexulti 0.25mg  tablets ?Key :  [id number] ?Waiting on determination.  ?

## 2022-01-29 NOTE — Telephone Encounter (Signed)
PA has been approved for Rexulti, made patient aware via MyChart.  ?

## 2022-02-02 ENCOUNTER — Telehealth: Payer: BC Managed Care – PPO | Admitting: Family Medicine

## 2022-02-06 ENCOUNTER — Encounter: Payer: Self-pay | Admitting: Family Medicine

## 2022-02-06 ENCOUNTER — Telehealth: Payer: BC Managed Care – PPO | Admitting: Family Medicine

## 2022-02-06 DIAGNOSIS — F332 Major depressive disorder, recurrent severe without psychotic features: Secondary | ICD-10-CM | POA: Diagnosis not present

## 2022-02-06 DIAGNOSIS — M5416 Radiculopathy, lumbar region: Secondary | ICD-10-CM

## 2022-02-06 NOTE — Progress Notes (Signed)
? ?There were no vitals taken for this visit.  ? ?Subjective:  ? ? Patient ID: Sharon Gibson, female    DOB: [birth year], 41 y.o.   MRN: [id number] ? ?HPI: ?Sharon Gibson is a 41 y.o. female ? ?Chief Complaint  ?Patient presents with  ? Depression  ?  Patient states she did not start rexulti after reading on the side effects. Patient states she has an appointment with psychiatry next Thursday.   ? ?DEPRESSION ?Mood status: stable ?Satisfied with current treatment?: no ?Symptom severity: severe  ?Duration of current treatment : chronic ?Side effects: no ?Medication compliance: good compliance ?Psychotherapy/counseling: yes current ?Depressed mood: yes ?Anxious mood: yes ?Anhedonia: yes ?Significant weight loss or gain: no ?Insomnia: no  ?Fatigue: yes ?Feelings of worthlessness or guilt: no ?Impaired concentration/indecisiveness: no ?Suicidal ideations: no ?Hopelessness: no ?Crying spells: no ? ?  02/06/2022  ?  4:03 PM 01/23/2022  ?  4:25 PM 01/08/2022  ?  2:39 PM 11/05/2021  ?  9:16 AM 09/15/2021  ?  8:18 AM  ?Depression screen PHQ 2/9  ?Decreased Interest 3 3 3 1 2   ?Down, Depressed, Hopeless 3 3 3 1 2   ?PHQ - 2 Score 6 6 6 2 4   ?Altered sleeping 3 3 3 3 3   ?Tired, decreased energy 3 3 3 3 3   ?Change in appetite 3 3 3 3 3   ?Feeling bad or failure about yourself  3 2 3 3 2   ?Trouble concentrating 3 3 3 3 2   ?Moving slowly or fidgety/restless 0 2 3 0 0  ?Suicidal thoughts 2 3 3 1 2   ?PHQ-9 Score 23 25 27 18 19   ?Difficult doing work/chores Extremely dIfficult Extremely dIfficult  Extremely dIfficult   ? ? ?Relevant past medical, surgical, family and social history reviewed and updated as indicated. Interim medical history since our last visit reviewed. ?Allergies and medications reviewed and updated. ? ?Review of Systems  ?Constitutional: Negative.   ?Respiratory: Negative.    ?Cardiovascular: Negative.   ?Gastrointestinal: Negative.   ?Skin: Negative.   ?Neurological: Negative.   ?Psychiatric/Behavioral:   Positive for dysphoric mood. Negative for agitation, behavioral problems, confusion, decreased concentration, hallucinations, self-injury, sleep disturbance and suicidal ideas. The patient is nervous/anxious. The patient is not hyperactive.   ? ?Per HPI unless specifically indicated above ? ?   ?Objective:  ?  ?There were no vitals taken for this visit.  ?Wt Readings from Last 3 Encounters:  ?11/18/21 275 lb (124.7 kg)  ?09/30/21 278 lb (126.1 kg)  ?09/01/21 278 lb (126.1 kg)  ?  ?Physical Exam ?Vitals and nursing note reviewed.  ?Constitutional:   ?   General: She is not in acute distress. ?   Appearance: Normal appearance. She is not ill-appearing, toxic-appearing or diaphoretic.  ?HENT:  ?   Head: Normocephalic and atraumatic.  ?   Right Ear: External ear normal.  ?   Left Ear: External ear normal.  ?   Nose: Nose normal.  ?   Mouth/Throat:  ?   Mouth: Mucous membranes are moist.  ?   Pharynx: Oropharynx is clear.  ?Eyes:  ?   General: No scleral icterus.    ?   Right eye: No discharge.     ?   Left eye: No discharge.  ?   Conjunctiva/sclera: Conjunctivae normal.  ?   Pupils: Pupils are equal, round, and reactive to light.  ?Pulmonary:  ?   Effort: Pulmonary effort is normal. No respiratory distress.  ?   Comments: Speaking  in full sentences ?Musculoskeletal:     ?   General: Normal range of motion.  ?   Cervical back: Normal range of motion.  ?Skin: ?   Coloration: Skin is not jaundiced or pale.  ?   Findings: No bruising, erythema, lesion or rash.  ?Neurological:  ?   Mental Status: She is alert and oriented to person, place, and time. Mental status is at baseline.  ?Psychiatric:     ?   Mood and Affect: Mood normal.     ?   Behavior: Behavior normal.     ?   Thought Content: Thought content normal.     ?   Judgment: Judgment normal.  ? ? ?Results for orders placed or performed in visit on 11/17/21  ?Iron and TIBC([facility])  ?Result Value Ref Range  ? Iron 58 28 - 170 ug/dL  ? TIBC 358 250 - 450 ug/dL   ? Saturation Ratios 16 10.4 - 31.8 %  ? UIBC 300 ug/dL  ?Vitamin B12  ?Result Value Ref Range  ? Vitamin B-12 493 180 - 914 pg/mL  ?Ferritin  ?Result Value Ref Range  ? Ferritin 28 11 - 307 ng/mL  ?CBC with Differential/Platelet  ?Result Value Ref Range  ? WBC 9.2 4.0 - 10.5 K/uL  ? RBC 4.74 3.87 - 5.11 MIL/uL  ? Hemoglobin 13.6 12.0 - 15.0 g/dL  ? HCT 41.8 36.0 - 46.0 %  ? MCV 88.2 80.0 - 100.0 fL  ? MCH 28.7 26.0 - 34.0 pg  ? MCHC 32.5 30.0 - 36.0 g/dL  ? RDW 13.2 11.5 - 15.5 %  ? Platelets 279 150 - 400 K/uL  ? nRBC 0.0 0.0 - 0.2 %  ? Neutrophils Relative % 69 %  ? Neutro Abs 6.3 1.7 - 7.7 K/uL  ? Lymphocytes Relative 23 %  ? Lymphs Abs 2.1 0.7 - 4.0 K/uL  ? Monocytes Relative 5 %  ? Monocytes Absolute 0.5 0.1 - 1.0 K/uL  ? Eosinophils Relative 2 %  ? Eosinophils Absolute 0.2 0.0 - 0.5 K/uL  ? Basophils Relative 1 %  ? Basophils Absolute 0.1 0.0 - 0.1 K/uL  ? Immature Granulocytes 0 %  ? Abs Immature Granulocytes 0.03 0.00 - 0.07 K/uL  ? ?   ?Assessment & Plan:  ? ?Problem List Items Addressed This Visit   ? ?  ? Nervous and Auditory  ? Lumbar radiculopathy  ?  Has not started PT yet. Continue to monitor. Call with any concerns.  ? ?  ?  ?  ? Other  ? Severe recurrent major depression without psychotic features ([facility]) - Primary  ?  Stable. Did not start rexaulti. She is due to see psychiatry next week. Call with any concerns. Await their input.  ? ?  ?  ?  ? ?Follow up plan: ?Return in about 4 weeks (around 03/06/2022). ? ? ?This visit was completed via video visit through MyChart due to the restrictions of the COVID-19 pandemic. All issues as above were discussed and addressed. Physical exam was done as above through visual confirmation on video through MyChart. If it was felt that the patient should be evaluated in the office, they were directed there. The patient verbally consented to this visit. ?Location of the patient: home ?Location of the provider: work ?Those involved with this call:  ?Provider: Olevia PerchesMegan  Chikita Dogan, DO ?CMA: Rolley Simsristina Dimas, CMA ?Front Desk/Registration: [employer]  ?Time spent on call:  15 minutes with patient face to face via video  conference. More than 50% of this time was spent in counseling and coordination of care. 23 minutes total spent in review of patient's record and preparation of their chart. ? ? ? ? ?

## 2022-02-06 NOTE — Assessment & Plan Note (Signed)
Has not started PT yet. Continue to monitor. Call with any concerns.  ?

## 2022-02-06 NOTE — Assessment & Plan Note (Signed)
Stable. Did not start rexaulti. She is due to see psychiatry next week. Call with any concerns. Await their input.  ?

## 2022-02-09 NOTE — Progress Notes (Signed)
LVM asking patient to call back to schedule a follow up appointment 

## 2022-02-10 NOTE — Progress Notes (Signed)
2nd attempt to reach patient to schedule her follow up ?

## 2022-02-11 NOTE — Progress Notes (Signed)
3rd attempt to reach patient by phone 

## 2022-02-12 ENCOUNTER — Ambulatory Visit (INDEPENDENT_AMBULATORY_CARE_PROVIDER_SITE_OTHER): Payer: BC Managed Care – PPO | Admitting: Psychiatry

## 2022-02-12 ENCOUNTER — Encounter: Payer: Self-pay | Admitting: Psychiatry

## 2022-02-12 VITALS — BP 136/84 | HR 70 | Temp 98.3°F | Wt 278.6 lb

## 2022-02-12 DIAGNOSIS — Z79899 Other long term (current) drug therapy: Secondary | ICD-10-CM | POA: Diagnosis not present

## 2022-02-12 DIAGNOSIS — Z9189 Other specified personal risk factors, not elsewhere classified: Secondary | ICD-10-CM | POA: Diagnosis not present

## 2022-02-12 DIAGNOSIS — L501 Idiopathic urticaria: Secondary | ICD-10-CM | POA: Insufficient documentation

## 2022-02-12 DIAGNOSIS — F332 Major depressive disorder, recurrent severe without psychotic features: Secondary | ICD-10-CM | POA: Diagnosis not present

## 2022-02-12 DIAGNOSIS — F4310 Post-traumatic stress disorder, unspecified: Secondary | ICD-10-CM | POA: Diagnosis not present

## 2022-02-12 DIAGNOSIS — H1045 Other chronic allergic conjunctivitis: Secondary | ICD-10-CM | POA: Insufficient documentation

## 2022-02-12 DIAGNOSIS — J300 Vasomotor rhinitis: Secondary | ICD-10-CM | POA: Insufficient documentation

## 2022-02-12 MED ORDER — QUETIAPINE FUMARATE 50 MG PO TABS: 50.0000 mg | ORAL_TABLET | Freq: Every day | ORAL | 1 refills | Status: DC

## 2022-02-12 NOTE — Patient Instructions (Signed)
Please call for EKG - [phone]   Quetiapine Tablets What is this medication? QUETIAPINE (kwe TYE a peen) treats schizophrenia and bipolar disorder. It works by balancing the levels of dopamine and serotonin in your brain, hormones that help regulate mood, behaviors, and thoughts. It belongs to a group of medications called antipsychotics. Antipsychotic medications can be used to treat several kinds of mental health conditions. This medicine may be used for other purposes; ask your health care provider or pharmacist if you have questions. COMMON BRAND NAME(S): Seroquel What should I tell my care team before I take this medication? They need to know if you have any of these conditions: Blockage in your bowel Cataracts Constipation Dementia Diabetes Difficulty swallowing Glaucoma Heart disease High levels of prolactin History of breast cancer History of irregular heartbeat Liver disease Low blood counts, like low white cell, platelet, or red cell counts Low blood pressure Parkinson's disease Prostate disease Seizures Suicidal thoughts, plans or attempt; a previous suicide attempt by you or a family member Thyroid disease Trouble passing urine An unusual or allergic reaction to quetiapine, other medications, foods, dyes, or preservatives Pregnant or trying to get pregnant Breast-feeding How should I use this medication? Take this medication by mouth. Swallow it with a drink of water. Follow the directions on the prescription label. If it upsets your stomach you can take it with food. Take your medication at regular intervals. Do not take it more often than directed. Do not stop taking except on the advice of your care team. A special MedGuide will be given to you by the pharmacist with each prescription and refill. Be sure to read this information carefully each time. Talk to your care team about the use of this medication in children. While this medication may be prescribed for  children as young as 10 years for selected conditions, precautions do apply. Patients over age 65 years may have a stronger reaction to this medication and need smaller doses. Overdosage: If you think you have taken too much of this medicine contact a poison control center or emergency room at once. NOTE: This medicine is only for you. Do not share this medicine with others. What if I miss a dose? If you miss a dose, take it as soon as you can. If it is almost time for your next dose, take only that dose. Do not take double or extra doses. What may interact with this medication? Do not take this medication with any of the following: Cisapride Dronedarone Metoclopramide Pimozide Thioridazine This medication may also interact with the following: Alcohol Antihistamines for allergy, cough, and cold Atropine Avasimibe Certain antivirals for HIV or hepatitis Certain medications for anxiety or sleep Certain medications for bladder problems like oxybutynin, tolterodine Certain medications for depression like amitriptyline, fluoxetine, nefazodone, sertraline Certain medications for fungal infections like fluconazole, ketoconazole, itraconazole, posaconazole Certain medications for stomach problems like dicyclomine, hyoscyamine Certain medications for travel sickness like scopolamine Cimetidine General anesthetics like halothane, isoflurane, methoxyflurane, propofol Ipratropium Levodopa or other medications for Parkinson's disease Medications for blood pressure Medications for seizures Medications that relax muscles for surgery Narcotic medications for pain Other medications that prolong the QT interval (cause an abnormal heart rhythm) Phenothiazines like chlorpromazine, prochlorperazine Rifampin St. John's Wort This list may not describe all possible interactions. Give your health care provider a list of all the medicines, herbs, non-prescription drugs, or dietary supplements you use.  Also tell them if you smoke, drink alcohol, or use illegal drugs. Some items may   interact with your medicine. What should I watch for while using this medication? Visit your care team for regular checks on your progress. Tell your care team if symptoms do not start to get better or if they get worse. Do not stop taking except on your care team's advice. You may develop a severe reaction. Your care team will tell you how much medication to take. You may need to have an eye exam before and during use of this medication. This medication may increase blood sugar. Ask your care team if changes in diet or medications are needed if you have diabetes. Patients and their families should watch out for new or worsening depression or thoughts of suicide. Also watch out for sudden or severe changes in feelings such as feeling anxious, agitated, panicky, irritable, hostile, aggressive, impulsive, severely restless, overly excited and hyperactive, or not being able to sleep. If this happens, especially at the beginning of antidepressant treatment or after a change in dose, call your care team. You may get dizzy or drowsy. Do not drive, use machinery, or do anything that needs mental alertness until you know how this medication affects you. Do not stand or sit up quickly, especially if you are an older patient. This reduces the risk of dizzy or fainting spells. Alcohol may interfere with the effect of this medication. Avoid alcoholic drinks. This medication can cause problems with controlling your body temperature. It can lower the response of your body to cold temperatures. If possible, stay indoors during cold weather. If you must go outdoors, wear warm clothes. It can also lower the response of your body to heat. Do not overheat. Do not over-exercise. Stay out of the sun when possible. If you must be in the sun, wear cool clothing. Drink plenty of water. If you have trouble controlling your body temperature, call your  care team right away. What side effects may I notice from receiving this medication? Side effects that you should report to your care team as soon as possible: Allergic reactions--skin rash, itching, hives, swelling of the face, lips, tongue, or throat Heart rhythm changes--fast or irregular heartbeat, dizziness, feeling faint or lightheaded, chest pain, trouble breathing High blood sugar (hyperglycemia)--increased thirst or amount of urine, unusual weakness or fatigue, blurry vision High fever, stiff muscles, increased sweating, fast or irregular heartbeat, and confusion, which may be signs of neuroleptic malignant syndrome High prolactin level--unexpected breast tissue growth, discharge from the nipple, change in sex drive or performance, irregular menstrual cycle Increase in blood pressure in children Infection--fever, chills, cough, or sore throat Low blood pressure--dizziness, feeling faint or lightheaded, blurry vision Low thyroid levels (hypothyroidism)--unusual weakness or fatigue, increased sensitivity to cold, constipation, hair loss, dry skin, weight gain, feelings of depression Pain or trouble swallowing Seizures Stroke--sudden numbness or weakness of the face, arm, or leg, trouble speaking, confusion, trouble walking, loss of balance or coordination, dizziness, severe headache, change in vision Sudden eye pain or change in vision such as blurry vision, seeing halos around lights, vision loss Thoughts of suicide or self-harm, worsening mood, feelings of depression Trouble passing urine Uncontrolled and repetitive body movements, muscle stiffness or spasms, tremors or shaking, loss of balance or coordination, restlessness, shuffling walk, which may be signs of extrapyramidal symptoms (EPS) Side effects that usually do not require medical attention (report to your care team if they continue or are bothersome): Constipation Dizziness Drowsiness Dry mouth Weight gain This list may  not describe all possible side effects. Call your doctor for   medical advice about side effects. You may report side effects to FDA at 1-800-FDA-1088. Where should I keep my medication? Keep out of the reach of children. Store at room temperature between 15 and 30 degrees C (59 and 86 degrees F). Throw away any unused medication after the expiration date. NOTE: This sheet is a summary. It may not cover all possible information. If you have questions about this medicine, talk to your doctor, pharmacist, or health care provider.  2023 Elsevier/Gold Standard (2021-08-15 00:00:00)  
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
## 2022-03-12 ENCOUNTER — Ambulatory Visit: Payer: BC Managed Care – PPO | Admitting: Psychiatry

## 2022-03-12 ENCOUNTER — Other Ambulatory Visit: Admission: RE | Admit: 2022-03-12 | Payer: BC Managed Care – PPO | Source: Ambulatory Visit | Admitting: Psychiatry

## 2022-03-12 ENCOUNTER — Encounter: Payer: Self-pay | Admitting: Psychiatry

## 2022-03-12 VITALS — BP 126/59 | HR 73 | Temp 97.9°F | Ht 63.5 in | Wt 271.0 lb

## 2022-03-12 DIAGNOSIS — F609 Personality disorder, unspecified: Secondary | ICD-10-CM

## 2022-03-12 DIAGNOSIS — R259 Unspecified abnormal involuntary movements: Secondary | ICD-10-CM

## 2022-03-12 DIAGNOSIS — F332 Major depressive disorder, recurrent severe without psychotic features: Secondary | ICD-10-CM

## 2022-03-12 DIAGNOSIS — F4310 Post-traumatic stress disorder, unspecified: Secondary | ICD-10-CM | POA: Diagnosis not present

## 2022-03-12 DIAGNOSIS — Z79899 Other long term (current) drug therapy: Secondary | ICD-10-CM

## 2022-03-12 DIAGNOSIS — Z9189 Other specified personal risk factors, not elsewhere classified: Secondary | ICD-10-CM

## 2022-03-12 MED ORDER — BENZTROPINE MESYLATE 0.5 MG PO TABS: 0.5000 mg | ORAL_TABLET | Freq: Every evening | ORAL | 1 refills | Status: DC | PRN

## 2022-03-12 NOTE — Patient Instructions (Signed)
Benztropine tablets What is this medication? BENZTROPINE (BENZ troe peen) is for certain movement problems due to Parkinson's disease, certain medicines, or other causes. This medicine may be used for other purposes; ask your health care provider or pharmacist if you have questions. COMMON BRAND NAME(S): Cogentin What should I tell my care team before I take this medication? They need to know if you have any of these conditions: glaucoma heart disease or a rapid heartbeat mental problems prostate trouble tardive dyskinesia an unusual or allergic reaction to benztropine, other medicines, lactose, foods, dyes, or preservatives pregnant or trying to get pregnant breast-feeding How should I use this medication? Take this medicine by mouth with a full glass of water. Follow the directions on the prescription label. Take your medicine at regular intervals. Do not take your medicine more often than directed. Talk to your pediatrician regarding the use of this medicine in children. While this drug may be prescribed for children as young as 3 years for selected conditions, precautions do apply. Overdosage: If you think you have taken too much of this medicine contact a poison control center or emergency room at once. NOTE: This medicine is only for you. Do not share this medicine with others. What if I miss a dose? If you miss a dose, take it as soon as you can. If it is almost time for your next dose, take only that dose. Do not take double or extra doses. What may interact with this medication? haloperidol medicines for movement abnormalities like Parkinson's disease phenothiazines like chlorpromazine, mesoridazine, prochlorperazine, thioridazine some antidepressants like amitriptyline, desipramine, doxepin, nortriptyline stimulant medicines for attention, weight loss, and to stay awake tegaserod This list may not describe all possible interactions. Give your health care provider a list of all  the medicines, herbs, non-prescription drugs, or dietary supplements you use. Also tell them if you smoke, drink alcohol, or use illegal drugs. Some items may interact with your medicine. What should I watch for while using this medication? Visit your doctor or health care professional for regular checks on your progress. You may get drowsy or dizzy. Do not drive, use machinery, or do anything that needs mental alertness until you know how this medicine affects you. Do not stand or sit up quickly, especially if you are an older patient. This reduces the risk of dizzy or fainting spells. Alcohol may interfere with the effect of this medicine. Avoid alcoholic drinks. Your mouth may get dry. Chewing sugarless gum or sucking hard candy, and drinking plenty of water may help. Contact your doctor if the problem does not go away or is severe. This medicine may cause dry eyes and blurred vision. If you wear contact lenses you may feel some discomfort. Lubricating drops may help. See your eye doctor if the problem does not go away or is severe. You may sweat less than usual while you are taking this medicine. As a result your body temperature could rise to a dangerous level. Be careful not to get overheated during exercise or in hot weather. You could get heat stroke. Avoid taking hot baths and using hot tubs and saunas. What side effects may I notice from receiving this medication? Side effects that you should report to your doctor or health care professional as soon as possible: allergic reactions like skin rash, itching or hives, swelling of the face, lips, or tongue changes in vision confusion decreased sweating or heat intolerance depression fast, irregular heartbeat hallucinations memory loss muscle weakness pain or difficulty passing   urine vomiting Side effects that usually do not require medical attention (report to your doctor or health care professional if they continue or are  bothersome): constipation dry mouth nausea This list may not describe all possible side effects. Call your doctor for medical advice about side effects. You may report side effects to FDA at 1-800-FDA-1088. Where should I keep my medication? Keep out of the reach of children. Store below 30 degrees C (86 degrees F). Keep container tightly closed. Throw away any unused medicine after the expiration date. NOTE: This sheet is a summary. It may not cover all possible information. If you have questions about this medicine, talk to your doctor, pharmacist, or health care provider.  2023 Elsevier/Gold Standard (2008-03-04 00:00:00)  

## 2022-03-12 NOTE — Progress Notes (Signed)
BH MD OP Progress Note  03/12/2022 6:24 PM Sharon Gibson  MRN:  [id number]  Chief Complaint:  Chief Complaint  Patient presents with   Follow-up: 41 year old Caucasian female, single, with history of depression, PTSD, presented for medication management.   HPI: Sharon Gibson is a 41 year old single, employed, lives in [place], has a history of MDD, PTSD, bulimia nervosa, presented for medication management.  Patient reports she is currently improving on the Seroquel, reports her mood symptoms has improved.  She does feel more motivated, less sad.  She reports she keeps herself distracted by spending time with her 65-week-old new  adopted puppy ' Spencer".  Patient reports she is spending a lot of time training her puppy and that helps her with her mood as well.  Patient does report possible side effects to Seroquel, restlessness of her legs at night.  She also stopped taking the clonidine and clonazepam at bedtime.  Unknown if this is due to discontinuation of clonidine or clonazepam or due to Seroquel.  Agreeable to trial of Cogentin.  Patient reports sleep is overall okay although it could improve.  She needs to work on her sleep hygiene as well since she has a new puppy.  Patient with chronic suicidal ideation, reports she often thinks about not wanting to be here and what is her purpose.  She has had plan recently within the past 3 months when she felt she should eat unhealthy food to slowly kill herself since she has a history of gastric bypass.  She however reports she has been thinking about making healthier choices and currently not preoccupied with her suicidal ideation like she had before.  She reports her new puppy does help her with that as well.  Currently in psychotherapy session with her therapist-Ms. Anna Genre.  Agreeable to continue to follow-up with therapist.  Reports family as supportive.  Denies any homicidality or perceptual disturbances.  Patient does report  rash on her upper extremities right-sided, forearm as well as arm.  Unknown if likely due to stopping gabapentin which was prescribed for rash.  Patient interested in starting back the gabapentin.  Patient denies any other concerns today. Visit Diagnosis:    ICD-10-CM   1. Severe episode of recurrent major depressive disorder, without psychotic features (HCC)  F33.2 EKG 12-Lead    2. PTSD (post-traumatic stress disorder)  F43.10 EKG 12-Lead    3. Unspecified abnormal involuntary movements  R25.9 benztropine (COGENTIN) 0.5 MG tablet    4. Personality disorder (HCC)  F60.9    Unspecified    5. High risk medication use  Z79.899 EKG 12-Lead    6. At risk for prolonged QT interval syndrome  Z91.89 EKG 12-Lead      Past Psychiatric History: Reviewed past psychiatric history from progress note on 02/12/2022.  Trials of medications like Wellbutrin, Seroquel, Abilify, clonidine, trazodone, Ambien-sleep complex behaviors, Paxil, Lexapro, Effexor.  Past Medical History:  Past Medical History:  Diagnosis Date   Anemia    Anxiety    Chicken pox    Depression    Eating disorder    Has had residential treatment and hospitalization previously.   Heart murmur    as a child   History of anemia    RECIEVES IRON INFUSIONS YEARLY   History of pulmonary embolus (PE) 2002   bilaterally-followed by [facility] hematology-w/u negative per pt   Hypothyroidism    no meds currently   PTSD (post-traumatic stress disorder)     Past Surgical History:  Procedure Laterality  Date   CHOLECYSTECTOMY  2003   EXCISION OF ENDOMETRIOMA     GASTRIC BYPASS  2003   LAPAROSCOPIC OVARIAN CYSTECTOMY Left 05/10/2018   Procedure: LAPAROSCOPIC OVARIAN CYSTECTOMY;  Surgeon: Natale Milch, MD;  Location: [facility] ORS;  Service: Gynecology;  Laterality: Left;    Family Psychiatric History: Reviewed family psychiatric history from progress note on 02/12/2022.  Family History:  Family History  Problem Relation Age of  Onset   Depression Father    Diabetes Father    Anxiety disorder Father    Alzheimer's disease Maternal Grandfather    Alzheimer's disease Maternal Grandmother    Prostate cancer Paternal Grandfather    Lung cancer Paternal Grandmother     Social History: Reviewed social history from progress note on 02/12/2022. Social History   Socioeconomic History   Marital status: Single    Spouse name: Not on file   Number of children: 0   Years of education: Not on file   Highest education level: Master's degree (e.g., MA, MS, MEng, MEd, MSW, MBA)  Occupational History   Not on file  Tobacco Use   Smoking status: Never   Smokeless tobacco: Never  Vaping Use   Vaping Use: Never used  Substance and Sexual Activity   Alcohol use: Not Currently    Alcohol/week: 0.0 - 1.0 standard drinks of alcohol    Comment: rare   Drug use: No   Sexual activity: Not Currently  Other Topics Concern   Not on file  Social History Narrative   Not on file   Social Determinants of Health   Financial Resource Strain: Not on file  Food Insecurity: Not on file  Transportation Needs: Not on file  Physical Activity: Sufficiently Active (02/03/2018)   Exercise Vital Sign    Days of Exercise per Week: 7 days    Minutes of Exercise per Session: 60 min  Stress: Stress Concern Present (02/03/2018)   [employer] of Occupational Health - Occupational Stress Questionnaire    Feeling of Stress : Very much  Social Connections: Not on file    Allergies:  Allergies  Allergen Reactions   Penicillins Hives    Has patient had a PCN reaction causing immediate rash, facial/tongue/throat swelling, SOB or lightheadedness with hypotension: yes Has patient had a PCN reaction causing severe rash involving mucus membranes or skin necrosis: no Has patient had a PCN reaction that required hospitalization: yes Has patient had a PCN reaction occurring within the last 10 years: no If all of the above answers are "NO", then  may proceed with Cephalosporin use.    Strawberry (Diagnostic) Swelling    AND WALNUTS-TONGUE SWELLING   Other Other (See Comments)    Narcotics- Burning in the stomach (whether PO or IV)   Azithromycin Nausea Only    Metabolic Disorder Labs: Lab Results  Component Value Date   HGBA1C 5.0 11/02/2019   No results found for: "PROLACTIN" Lab Results  Component Value Date   CHOL 242 (H) 04/03/2021   TRIG 153 (H) 04/03/2021   HDL 67 04/03/2021   CHOLHDL 3 03/20/2016   VLDL 12.2 03/20/2016   LDLCALC 148 (H) 04/03/2021   LDLCALC 95 08/16/2020   Lab Results  Component Value Date   TSH 2.100 04/03/2021   TSH 3.997 11/14/2020    Therapeutic Level Labs: No results found for: "LITHIUM" No results found for: "VALPROATE" No results found for: "CBMZ"  Current Medications: Current Outpatient Medications  Medication Sig Dispense Refill   benztropine (COGENTIN) 0.5  MG tablet Take 1 tablet (0.5 mg total) by mouth at bedtime as needed. For side effects of seroquel 30 tablet 1   levothyroxine (SYNTHROID) 125 MCG tablet Take 1 tablet (125 mcg total) by mouth daily. 90 tablet 3   prazosin (MINIPRESS) 1 MG capsule Take 1 capsule (1 mg total) by mouth at bedtime. 90 capsule 1   QUEtiapine (SEROQUEL) 50 MG tablet Take 1 tablet (50 mg total) by mouth at bedtime. 30 tablet 1   sertraline (ZOLOFT) 100 MG tablet Take 2.5 tablets (250 mg total) by mouth at bedtime. 225 tablet 1   EPINEPHrine 0.3 mg/0.3 mL IJ SOAJ injection Inject 0.3 mg into the muscle as needed for anaphylaxis. (Patient not taking: Reported on 02/12/2022)     gabapentin (NEURONTIN) 100 MG capsule Take 1 capsule (100 mg total) by mouth at bedtime. (Patient not taking: Reported on 03/12/2022) 90 capsule 1   hydrOXYzine (ATARAX) 25 MG tablet Take 1 tablet (25 mg total) by mouth daily. At night (Patient not taking: Reported on 03/12/2022) 90 tablet 1   Vitamin D, Ergocalciferol, (DRISDOL) 1.25 MG (50000 UNIT) CAPS capsule Take 1 capsule  (50,000 Units total) by mouth every 7 (seven) days. (Patient not taking: Reported on 03/12/2022) 12 capsule 1   No current facility-administered medications for this visit.     Musculoskeletal: Strength & Muscle Tone: within normal limits Gait & Station: normal Patient leans: N/A  Psychiatric Specialty Exam: Review of Systems  Skin:  Positive for rash.  Psychiatric/Behavioral:  Positive for dysphoric mood, sleep disturbance and suicidal ideas (Chronic as noted).   All other systems reviewed and are negative.   Blood pressure (!) 126/59, pulse 73, temperature 97.9 F (36.6 C), height 5' 3.5" (1.613 m), weight 271 lb (122.9 kg), SpO2 96 %.Body mass index is 47.25 kg/m.  General Appearance: Casual  Eye Contact:  Fair  Speech:  Clear and Coherent  Volume:  Normal  Mood:  Depressed  Affect:  Full Range  Thought Process:  Goal Directed and Descriptions of Associations: Intact  Orientation:  Full (Time, Place, and Person)  Thought Content: Logical   Suicidal Thoughts:  No however does report a history of suicidal ideation to use unhealthy food to slowly kill herself, however reports she is not too preoccupied with that at this time.  Homicidal Thoughts:  No  Memory:  Immediate;   Fair Recent;   Fair Remote;   Fair  Judgement:  Fair  Insight:  Fair  Psychomotor Activity:  Normal  Concentration:  Concentration: Fair and Attention Span: Fair  Recall:  [employer] of Knowledge: Fair  Language: Fair  Akathisia:  No  Handed:  Right  AIMS (if indicated): done  Assets:  Communication Skills Desire for Improvement Housing Social Support Talents/Skills Transportation Vocational/Educational  ADL's:  Intact  Cognition: WNL  Sleep:   Restless   Screenings: AIMS    Flowsheet Row Office Visit from 03/12/2022 in [facility] Office Visit from 02/12/2022 in [facility]  AIMS Total Score 3 0      GAD-7    Flowsheet Row  Office Visit from 02/12/2022 in [facility] Video Visit from 02/06/2022 in [facility] Video Visit from 01/23/2022 in [facility] Office Visit from 01/08/2022 in [facility] Video Visit from 11/05/2021 in [facility]  Total GAD-7 Score 20 20 16 21 19       PHQ2-9    Flowsheet Row Office Visit from 03/12/2022 in  [facility] Office Visit from 02/12/2022 in [facility] Video Visit from 02/06/2022 in [facility] Video Visit from 01/23/2022 in [facility] Office Visit from 01/08/2022 in [facility]  PHQ-2 Total Score 2 6 6 6 6   PHQ-9 Total Score 18 25 23 25 27       Flowsheet Row Office Visit from 03/12/2022 in [facility] Office Visit from 02/12/2022 in [facility] Pre-Admission Testing 44 from 09/30/2021 in [facility] PRE ADMISSION TESTING  C-SSRS RISK CATEGORY High Risk Error: Q3, 4, or 5 should not be populated when Q2 is No No Risk        Assessment and Plan: Lundynn Cohoon is a 42 year old Caucasian female who has a history of PTSD, depression, eating disorder was evaluated in office today.  Patient is currently improving, likely with possible side effects of Seroquel, will benefit from the following plan.   Plan MDD-improving Seroquel 50 mg p.o. nightly, we will consider increasing the Seroquel dosage once she is able to get her EKG completed. Continue prazosin 1 mg at bedtime Sertraline 250 mg p.o. daily Hydroxyzine 25 mg p.o. daily as needed Patient is currently not on the clonazepam-stopped taking it.  PTSD-improving Continue prazosin 1 mg at bedtime Sertraline 250 mg p.o. daily Continue psychotherapy sessions with Ms. [name].  High risk medication use-printed out prolactin level, hemoglobin A1c, TSH and T4 again-provided lab  slip again.  Patient agrees to get it done.  At risk for prolonged QT syndrome-EKG ordered on systems-encouraged to do it.  Personality disorder unspecified-unstable Patient may benefit from DBT, advised to follow up with her therapist to discuss.  Abnormal involuntary movement-unspecified-unstable-likely due to her Seroquel We will start Cogentin 0.5 mg p.o. nightly as needed Provided medication education. AIMS - 3  Patient agrees to follow up with primary care provider regarding her rash.  Follow-up in clinic in 6 weeks or sooner if needed.    This note was generated in part or whole with voice recognition software. Voice recognition is usually quite accurate but there are transcription errors that can and very often do occur. I apologize for any typographical errors that were not detected and corrected.    45, MD 03/12/2022, 6:24 PM

## 2022-04-10 ENCOUNTER — Other Ambulatory Visit: Payer: Self-pay | Admitting: Psychiatry

## 2022-04-10 DIAGNOSIS — F4310 Post-traumatic stress disorder, unspecified: Secondary | ICD-10-CM

## 2022-04-10 DIAGNOSIS — F332 Major depressive disorder, recurrent severe without psychotic features: Secondary | ICD-10-CM

## 2022-04-20 ENCOUNTER — Telehealth: Payer: Self-pay | Admitting: Psychiatry

## 2022-04-20 ENCOUNTER — Other Ambulatory Visit
Admission: RE | Admit: 2022-04-20 | Discharge: 2022-04-20 | Disposition: A | Discharge status: Home / Self Care | Payer: BC Managed Care – PPO | Attending: Psychiatry | Admitting: Psychiatry

## 2022-04-20 ENCOUNTER — Ambulatory Visit: Payer: BC Managed Care – PPO | Admitting: Psychiatry

## 2022-04-20 ENCOUNTER — Encounter: Payer: Self-pay | Admitting: Psychiatry

## 2022-04-20 VITALS — BP 132/81 | HR 90 | Temp 97.9°F | Wt 269.4 lb

## 2022-04-20 DIAGNOSIS — F332 Major depressive disorder, recurrent severe without psychotic features: Secondary | ICD-10-CM

## 2022-04-20 DIAGNOSIS — F4310 Post-traumatic stress disorder, unspecified: Secondary | ICD-10-CM | POA: Diagnosis not present

## 2022-04-20 DIAGNOSIS — Z79899 Other long term (current) drug therapy: Secondary | ICD-10-CM

## 2022-04-20 DIAGNOSIS — F609 Personality disorder, unspecified: Secondary | ICD-10-CM

## 2022-04-20 LAB — TSH: TSH: 7.889 u[IU]/mL — ABNORMAL HIGH (ref 0.350–4.500)

## 2022-04-20 LAB — HEMOGLOBIN A1C
Hgb A1c MFr Bld: 5.1 % (ref 4.8–5.6)
Mean Plasma Glucose: 99.67 mg/dL

## 2022-04-20 LAB — T4, FREE: Free T4: 0.65 ng/dL (ref 0.61–1.12)

## 2022-04-20 MED ORDER — FLUOXETINE HCL 10 MG PO CAPS: 10.0000 mg | ORAL_CAPSULE | ORAL | 0 refills | Status: DC

## 2022-04-20 MED ORDER — PRAZOSIN HCL 1 MG PO CAPS: 1.0000 mg | ORAL_CAPSULE | Freq: Every day | ORAL | 1 refills | Status: DC

## 2022-04-20 MED ORDER — SERTRALINE HCL 100 MG PO TABS: 100.0000 mg | ORAL_TABLET | ORAL | 0 refills | Status: DC

## 2022-04-20 NOTE — Telephone Encounter (Signed)
Attempted to contact patient to discuss lab results-TSH-elevated.  Had to leave a voicemail.  Patient to contact primary care provider for abnormal TSH level.

## 2022-04-20 NOTE — Progress Notes (Unsigned)
BH MD OP Progress Note  04/20/2022 12:27 PM Sharon Gibson  MRN:  [id number]  Chief Complaint:  Chief Complaint  Patient presents with   Follow-up: 41 year old Caucasian female, single, has a history of depression, PTSD, personality disorder, presented for medication management.   HPI: Sharon Gibson is a 41 year old female, single, employed, lives in [place], has a history of MDD, PTSD, bulimia nervosa, presented for medication management.  Patient today reports she did not tolerate the Seroquel.  She hence would like to stop taking it.  She was feeling extremely restless on the Seroquel.  She also reports increased appetite and was concerned about weight gain.  Patient reports she continues to struggle with depression and anxiety symptoms.  Although she feels more motivated than before and has been spending a lot of time with her newly adopted puppy-Spencer, she continues to have sadness, low motivation, low energy.  She also reports worrying about things in general, being restless, anxious, nervous.  Patient is currently on sertraline 250 mg and that does not seem to help at all with her mood symptoms.  She has been on this medication for a long time.  She has tried multiple other medications including Paxil, Lexapro, Effexor.  She may have tried Prozac in high school however does not clearly remember.  Reports she did not have any allergic reaction or side effects.  Agreeable to a trial.  Patient reports she ran out of her prazosin 2 days ago.  Since then sleep has been more restless.  Does have nightmares.  Currently working with her therapist, brainspotting once a week which has helped with her PTSD symptoms.  Patient denies any homicidality or perceptual disturbances.  Patient with chronic suicidality, reports that has improved, has not had any significant SI recently.  Patient with increased caffeine use, observed in session as drinking caffeinated coffee, reports she is known for  her coffee intake and is aware that she needs to cut back.  Patient denies any other concerns today.  Visit Diagnosis:    ICD-10-CM   1. Severe episode of recurrent major depressive disorder, without psychotic features (HCC)  F33.2 sertraline (ZOLOFT) 100 MG tablet    FLUoxetine (PROZAC) 10 MG capsule    2. PTSD (post-traumatic stress disorder)  F43.10 sertraline (ZOLOFT) 100 MG tablet    FLUoxetine (PROZAC) 10 MG capsule    prazosin (MINIPRESS) 1 MG capsule    3. Personality disorder (HCC)  F60.9    Unspecified    4. High risk medication use  Z79.899       Past Psychiatric History: Reviewed past psychiatric history from progress note on 02/12/2022.  Past trials of medications like Wellbutrin, Seroquel, Abilify, clonidine, trazodone, Ambien-sleep complex behaviors, Paxil, Lexapro, Effexor, sertraline, Prozac.  Past Medical History:  Past Medical History:  Diagnosis Date   Anemia    Anxiety    Chicken pox    Depression    Eating disorder    Has had residential treatment and hospitalization previously.   Heart murmur    as a child   History of anemia    RECIEVES IRON INFUSIONS YEARLY   History of pulmonary embolus (PE) 2002   bilaterally-followed by [facility] hematology-w/u negative per pt   Hypothyroidism    no meds currently   PTSD (post-traumatic stress disorder)     Past Surgical History:  Procedure Laterality Date   CHOLECYSTECTOMY  2003   EXCISION OF ENDOMETRIOMA     GASTRIC BYPASS  2003   LAPAROSCOPIC OVARIAN CYSTECTOMY Left  05/10/2018   Procedure: LAPAROSCOPIC OVARIAN CYSTECTOMY;  Surgeon: Natale Milch, MD;  Location: [facility] ORS;  Service: Gynecology;  Laterality: Left;    Family Psychiatric History: Reviewed family psychiatric history from progress note on 02/12/2022.  Family History:  Family History  Problem Relation Age of Onset   Depression Father    Diabetes Father    Anxiety disorder Father    Alzheimer's disease Maternal Grandfather     Alzheimer's disease Maternal Grandmother    Prostate cancer Paternal Grandfather    Lung cancer Paternal Grandmother     Social History: Reviewed social history from progress note on 02/12/2022. Social History   Socioeconomic History   Marital status: Single    Spouse name: Not on file   Number of children: 0   Years of education: Not on file   Highest education level: Master's degree (e.g., MA, MS, MEng, MEd, MSW, MBA)  Occupational History   Not on file  Tobacco Use   Smoking status: Never   Smokeless tobacco: Never  Vaping Use   Vaping Use: Never used  Substance and Sexual Activity   Alcohol use: Not Currently    Alcohol/week: 0.0 - 1.0 standard drinks of alcohol    Comment: rare   Drug use: No   Sexual activity: Not Currently  Other Topics Concern   Not on file  Social History Narrative   Not on file   Social Determinants of Health   Financial Resource Strain: Not on file  Food Insecurity: Not on file  Transportation Needs: Not on file  Physical Activity: Sufficiently Active (02/03/2018)   Exercise Vital Sign    Days of Exercise per Week: 7 days    Minutes of Exercise per Session: 60 min  Stress: Stress Concern Present (02/03/2018)   [employer] of Occupational Health - Occupational Stress Questionnaire    Feeling of Stress : Very much  Social Connections: Not on file    Allergies:  Allergies  Allergen Reactions   Penicillins Hives    Has patient had a PCN reaction causing immediate rash, facial/tongue/throat swelling, SOB or lightheadedness with hypotension: yes Has patient had a PCN reaction causing severe rash involving mucus membranes or skin necrosis: no Has patient had a PCN reaction that required hospitalization: yes Has patient had a PCN reaction occurring within the last 10 years: no If all of the above answers are "NO", then may proceed with Cephalosporin use.    Strawberry (Diagnostic) Swelling    AND WALNUTS-TONGUE SWELLING   Other Other  (See Comments)    Narcotics- Burning in the stomach (whether PO or IV)   Azithromycin Nausea Only    Metabolic Disorder Labs: Lab Results  Component Value Date   HGBA1C 5.1 04/20/2022   MPG 99.67 04/20/2022   No results found for: "PROLACTIN" Lab Results  Component Value Date   CHOL 242 (H) 04/03/2021   TRIG 153 (H) 04/03/2021   HDL 67 04/03/2021   CHOLHDL 3 03/20/2016   VLDL 12.2 03/20/2016   LDLCALC 148 (H) 04/03/2021   LDLCALC 95 08/16/2020   Lab Results  Component Value Date   TSH 7.889 (H) 04/20/2022   TSH 2.100 04/03/2021    Therapeutic Level Labs: No results found for: "LITHIUM" No results found for: "VALPROATE" No results found for: "CBMZ"  Current Medications: Current Outpatient Medications  Medication Sig Dispense Refill   EPINEPHrine 0.3 mg/0.3 mL IJ SOAJ injection Inject 0.3 mg into the muscle as needed for anaphylaxis.     FLUoxetine (  PROZAC) 10 MG capsule Take 1-2 capsules (10-20 mg total) by mouth as directed. Take 1 capsule daily with breakfast for 1 week and increase to 2 capsules daily after that 60 capsule 0   gabapentin (NEURONTIN) 100 MG capsule Take 1 capsule (100 mg total) by mouth at bedtime. 90 capsule 1   hydrOXYzine (ATARAX) 25 MG tablet Take 1 tablet (25 mg total) by mouth daily. At night 90 tablet 1   levothyroxine (SYNTHROID) 125 MCG tablet Take 1 tablet (125 mcg total) by mouth daily. 90 tablet 3   sertraline (ZOLOFT) 100 MG tablet Take 1 tablet (100 mg total) by mouth as directed. Take sertraline 100 mg for 1 week, and then 50 mg for 1 week and stop 30 tablet 0   Vitamin D, Ergocalciferol, (DRISDOL) 1.25 MG (50000 UNIT) CAPS capsule Take 1 capsule (50,000 Units total) by mouth every 7 (seven) days. 12 capsule 1   prazosin (MINIPRESS) 1 MG capsule Take 1 capsule (1 mg total) by mouth at bedtime. 90 capsule 1   No current facility-administered medications for this visit.     Musculoskeletal: Strength & Muscle Tone: within normal  limits Gait & Station: normal Patient leans: N/A  Psychiatric Specialty Exam: Review of Systems  Psychiatric/Behavioral:  Positive for dysphoric mood, self-injury (thoughts of eating unhealthy food and slowly die - improving with medications) and sleep disturbance. The patient is nervous/anxious.   All other systems reviewed and are negative.   Blood pressure 132/81, pulse 90, temperature 97.9 F (36.6 C), temperature source Temporal, weight 269 lb 6.4 oz (122.2 kg).Body mass index is 46.97 kg/m.  General Appearance: Casual  Eye Contact:  Fair  Speech:  Clear and Coherent  Volume:  Normal  Mood:  Anxious and Depressed  Affect:  Full Range  Thought Process:  Goal Directed and Descriptions of Associations: Intact  Orientation:  Full (Time, Place, and Person)  Thought Content: Logical   Suicidal Thoughts:  No  Homicidal Thoughts:  No  Memory:  Immediate;   Fair Recent;   Fair Remote;   Fair  Judgement:  Fair  Insight:  Fair  Psychomotor Activity:  Normal  Concentration:  Concentration: Fair and Attention Span: Fair  Recall:  [employer] of Knowledge: Fair  Language: Fair  Akathisia:  No  Handed:  Right  AIMS (if indicated): not done  Assets:  Communication Skills Desire for Improvement Housing Social Support  ADL's:  Intact  Cognition: WNL  Sleep:   Restless   Screenings: AIMS    Flowsheet Row Office Visit from 03/12/2022 in [facility] Office Visit from 02/12/2022 in [facility]  AIMS Total Score 3 0      GAD-7    Flowsheet Row Office Visit from 04/20/2022 in [facility] Office Visit from 02/12/2022 in [facility] Video Visit from 02/06/2022 in [facility] Video Visit from 01/23/2022 in [facility] Office Visit from 01/08/2022 in [facility]  Total GAD-7 Score 20 20 20 16 21       PHQ2-9    Flowsheet Row Office  Visit from 04/20/2022 in [facility] Office Visit from 03/12/2022 in [facility] Office Visit from 02/12/2022 in [facility] Video Visit from 02/06/2022 in [facility] Video Visit from 01/23/2022 in [facility]  PHQ-2 Total Score 1 2 6 6 6   PHQ-9 Total Score 16 18 25 23 25       Flowsheet  Row Office Visit from 04/20/2022 in [facility] Office Visit from 03/12/2022 in [facility] Office Visit from 02/12/2022 in [facility]  C-SSRS RISK CATEGORY Low Risk High Risk Error: Q3, 4, or 5 should not be populated when Q2 is No        Assessment and Plan: Sharon Gibson is a PTSD, depression, eating disorder was evaluated in office today.  Patient is currently struggling with side effects of Seroquel, as well as depression and anxiety, will benefit from following plan.  Plan MDD-unstable Discontinue Seroquel for side effects. Discontinue Cogentin. Taper of sertraline.  Patient to start taking sertraline 100 mg daily for 1 week and then 50 mg p.o. daily for another week and stop taking it. Start Prozac 10 mg p.o. daily.  She can take the 10 mg for a week and increase to 20 mg after that. Hydroxyzine 25 mg p.o. daily as needed Continue CBT.  Patient advised to have more frequent psychotherapy sessions.  PTSD-improving Restart prazosin 1 mg at bedtime-ran out of it 2 days ago. Taper of sertraline and start Prozac as noted above. Continue psychotherapy sessions with Ms. Anna Genre.  Personality disorder unspecified-unstable Continue psychotherapy, may benefit from DBT. Patient with chronic suicidality, has had plan to slowly kill herself by eating unhealthy food.  Currently denies it.  High risk medication use-pending lab report - hemoglobin A1c, prolactin, TSH-patient reports she got the labs done  prior to coming into the office today.  Follow-up in clinic in 3 to 4 weeks or sooner if needed.  Crisis plan discussed with patient.  Patient to go to the nearest emergency department/urgent care if having serious side effects to medication including serotonin syndrome.  This note was generated in part or whole with voice recognition software. Voice recognition is usually quite accurate but there are transcription errors that can and very often do occur. I apologize for any typographical errors that were not detected and corrected.      Jomarie Longs, MD 04/21/2022, 6:58 AM

## 2022-04-20 NOTE — Patient Instructions (Addendum)
Serotonin Syndrome Serotonin is a chemical in your body (neurotransmitter) that helps to control several functions, such as: Brain and nerve cell function. Mood and emotions. Memory. Eating. Sleeping. Sexual activity. Stress response. Having too much serotonin in your body can cause serotonin syndrome. This condition can be harmful to your brain and nerve cells. This can be a life-threatening condition. What are the causes? This condition may be caused by taking medicines or drugs that increase the level of serotonin in your body, such as: Antidepressant medicines. Migraine medicines. Certain pain medicines. Certain drugs, including ecstasy, LSD, cocaine, and amphetamines. Over-the-counter cough or cold medicines that contain dextromethorphan. Certain herbal supplements, including St. John's wort, ginseng, and nutmeg. This condition usually occurs when you take these medicines or drugs in combination, but it can also happen with a high dose of a single medicine or drug. What increases the risk? You are more likely to develop this condition if: You just started taking a medicine or drug that increases the level of serotonin in the body. You recently increased the dose of a medicine or drug that increases the level of serotonin in the body. You take more than one medicine or drug that increases the level of serotonin in the body. What are the signs or symptoms? Symptoms of this condition usually start within several hours of taking a medicine or drug. Symptoms may be mild or severe. Mild symptoms include: Sweating. Restlessness or agitation. Muscle twitching or stiffness. Rapid heart rate. Nausea and vomiting. Diarrhea. Headache. Shivering or goose bumps. Confusion. Severe symptoms include: Irregular heartbeat. Seizures. Loss of consciousness. High fever. How is this diagnosed? This condition may be diagnosed based on: Your medical history.  A physical exam. Your prior use  of drugs and medicines. Blood or urine tests. These may be used to rule out other causes of your symptoms. How is this treated? The treatment for this condition depends on the severity of your symptoms. For mild cases, stopping the medicine or drug that caused your condition is usually all that is needed. For moderate to severe cases, treatment in a hospital may be needed to prevent or manage life-threatening symptoms. This may include medicines to control your symptoms, IV fluids, interventions to support your breathing, and treatments to control your body temperature. Follow these instructions at home: Medicines  Take over-the-counter and prescription medicines only as told by your health care provider. This is important. Check with your health care provider before you start taking any new prescriptions, over-the-counter medicines, herbs, or supplements. Avoid combining any medicines that can cause this condition to occur. Lifestyle  Maintain a healthy lifestyle. Eat a healthy diet that includes plenty of vegetables, fruits, whole grains, low-fat dairy products, and lean protein. Do not eat a lot of foods that are high in fat, added sugars, or salt. Get the right amount and quality of sleep. Most adults need 7-9 hours of sleep each night. Make time to exercise, even if it is only for short periods of time. Most adults should exercise for at least 150 minutes each week. Do not drink alcohol. Do not use illegal drugs, and do not take medicines for reasons other than they are prescribed. General instructions Do not use any products that contain nicotine or tobacco, such as cigarettes and e-cigarettes. If you need help quitting, ask your health care provider. Keep all follow-up visits as told by your health care provider. This is important. Contact a health care provider if: Your symptoms do not improve or they  get worse. Get help right away if you: Have worsening confusion, severe headache,  chest pain, high fever, seizures, or loss of consciousness. Experience serious side effects of medicine, such as swelling of your face, lips, tongue, or throat. Have serious thoughts about hurting yourself or others. These symptoms may represent a serious problem that is an emergency. Do not wait to see if the symptoms will go away. Get medical help right away. Call your local emergency services (911 in the [place] Do not drive yourself to the hospital. If you ever feel like you may hurt yourself or others, or have thoughts about taking your own life, get help right away. You can go to your nearest emergency department or call: Your local emergency services (911 in the [place] A suicide crisis helpline, such as the National Suicide Prevention Lifeline at 8123640540 or 988 in the [place] This is open 24 hours a day. Summary Serotonin is a brain chemical that helps to regulate the nervous system. High levels of serotonin in the body can cause serotonin syndrome, which is a very dangerous condition. This condition may be caused by taking medicines or drugs that increase the level of serotonin in your body. Treatment depends on the severity of your symptoms. For mild cases, stopping the medicine or drug that caused your condition is usually all that is needed. Check with your health care provider before you start taking any new prescriptions, over-the-counter medicines, herbs, or supplements. This information is not intended to replace advice given to you by your health care provider. Make sure you discuss any questions you have with your health care provider. Document Revised: 08/08/2021 Document Reviewed: 07/17/2021 Elsevier Patient Education  2023 Elsevier [employer]. Fluoxetine Capsules or Tablets (Depression/Mood Disorders) What is this medication? FLUOXETINE (floo OX e teen) treats depression, anxiety, obsessive-compulsive disorder (OCD), and eating disorders. It increases the amount of serotonin in the  brain, a hormone that helps regulate mood. It belongs to a group of medications called SSRIs. This medicine may be used for other purposes; ask your health care provider or pharmacist if you have questions. COMMON BRAND NAME(S): Prozac What should I tell my care team before I take this medication? They need to know if you have any of these conditions: Bipolar disorder or a family history of bipolar disorder Bleeding disorders Glaucoma Heart disease Liver disease Low levels of sodium in the blood Seizures Suicidal thoughts, plans, or attempt; a previous suicide attempt by you or a family member Take MAOIs like Carbex, Eldepryl, Marplan, Nardil, and Parnate Take medications that treat or prevent blood clots Thyroid disease An unusual or allergic reaction to fluoxetine, other medications, foods, dyes, or preservatives Pregnant or trying to get pregnant Breast-feeding How should I use this medication? Take this medication by mouth with a glass of water. Follow the directions on the prescription label. You can take this medication with or without food. Take your medication at regular intervals. Do not take it more often than directed. Do not stop taking this medication suddenly except upon the advice of your care team. Stopping this medication too quickly may cause serious side effects or your condition may worsen. A special MedGuide will be given to you by the pharmacist with each prescription and refill. Be sure to read this information carefully each time. Talk to your care team about the use of this medication in children. While it may be prescribed for children as young as 7 years for selected conditions, precautions do apply. Overdosage: If you think  you have taken too much of this medicine contact a poison control center or emergency room at once. NOTE: This medicine is only for you. Do not share this medicine with others. What if I miss a dose? If you miss a dose, skip the missed dose  and go back to your regular dosing schedule. Do not take double or extra doses. What may interact with this medication? Do not take this medication with any of the following: Other medications containing fluoxetine, like Sarafem or Symbyax Cisapride Dronedarone Linezolid MAOIs like Carbex, Eldepryl, Marplan, Nardil, and Parnate Methylene blue (injected into a vein) Pimozide Thioridazine This medication may also interact with the following: Alcohol Amphetamines Aspirin and aspirin-like medications Carbamazepine Certain medications for depression, anxiety, or psychotic disturbances Certain medications for migraine headaches like almotriptan, eletriptan, frovatriptan, naratriptan, rizatriptan, sumatriptan, zolmitriptan Digoxin Diuretics Fentanyl Flecainide Furazolidone Isoniazid Lithium Medications for sleep Medications that treat or prevent blood clots like warfarin, enoxaparin, and dalteparin NSAIDs, medications for pain and inflammation, like ibuprofen or naproxen Other medications that prolong the QT interval (an abnormal heart rhythm) Phenytoin Procarbazine Propafenone Rasagiline Ritonavir Supplements like St. John's wort, kava kava, valerian Tramadol Tryptophan Vinblastine This list may not describe all possible interactions. Give your health care provider a list of all the medicines, herbs, non-prescription drugs, or dietary supplements you use. Also tell them if you smoke, drink alcohol, or use illegal drugs. Some items may interact with your medicine. What should I watch for while using this medication? Tell your care team if your symptoms do not get better or if they get worse. Visit your care team for regular checks on your progress. Because it may take several weeks to see the full effects of this medication, it is important to continue your treatment as prescribed. Watch for new or worsening thoughts of suicide or depression. This includes sudden changes in mood,  behavior, or thoughts. These changes can happen at any time but are more common in the beginning of treatment or after a change in dose. Call your care team right away if you experience these thoughts or worsening depression. Manic episodes may happen in patients with bipolar disorder who take this medication. Watch for changes in feelings or behaviors such as feeling anxious, nervous, agitated, panicky, irritable, hostile, aggressive, impulsive, severely restless, overly excited and hyperactive, or trouble sleeping. These symptoms can happen at anytime but are more common in the beginning of treatment or after a change in dose. Call your care team right away if you notice any of these symptoms. You may get drowsy or dizzy. Do not drive, use machinery, or do anything that needs mental alertness until you know how this medication affects you. Do not stand or sit up quickly, especially if you are an older patient. This reduces the risk of dizzy or fainting spells. Alcohol may interfere with the effect of this medication. Avoid alcoholic drinks. Your mouth may get dry. Chewing sugarless gum or sucking hard candy, and drinking plenty of water may help. Contact your care team if the problem does not go away or is severe. This medication may affect blood sugar levels. If you have diabetes, check with your care team before you make changes to your diet or medications. What side effects may I notice from receiving this medication? Side effects that you should report to your care team as soon as possible: Allergic reactions--skin rash, itching, hives, swelling of the face, lips, tongue, or throat Bleeding--bloody or black, tar-like stools, red or dark brown  urine, vomiting blood or brown material that looks like coffee grounds, small, red or purple spots on skin, unusual bleeding or bruising Heart rhythm changes--fast or irregular heartbeat, dizziness, feeling faint or lightheaded, chest pain, trouble  breathing Loss of appetite with weight loss Low sodium level--muscle weakness, fatigue, dizziness, headache, confusion Serotonin syndrome--irritability, confusion, fast or irregular heartbeat, muscle stiffness, twitching muscles, sweating, high fever, seizure, chills, vomiting, diarrhea Sudden eye pain or change in vision such as blurry vision, seeing halos around lights, vision loss Thoughts of suicide or self-harm, worsening mood, feelings of depression Side effects that usually do not require medical attention (report to your care team if they continue or are bothersome): Anxiety, nervousness Change in sex drive or performance Diarrhea Dry mouth Headache Excessive sweating Nausea Tremors or shaking Trouble sleeping Upset stomach This list may not describe all possible side effects. Call your doctor for medical advice about side effects. You may report side effects to FDA at 1-800-FDA-1088. Where should I keep my medication? Keep out of the reach of children and pets. Store at room temperature between 15 and 30 degrees C (59 and 86 degrees F). Get rid of any unused medication after the expiration date. NOTE: This sheet is a summary. It may not cover all possible information. If you have questions about this medicine, talk to your doctor, pharmacist, or health care provider.  2023 Elsevier/Gold Standard (2020-11-27 00:00:00)

## 2022-04-21 ENCOUNTER — Encounter: Payer: Self-pay | Admitting: Psychiatry

## 2022-04-21 ENCOUNTER — Telehealth: Payer: Self-pay | Admitting: Psychiatry

## 2022-04-21 LAB — PROLACTIN: Prolactin: 17 ng/mL (ref 4.8–23.3)

## 2022-04-21 NOTE — Telephone Encounter (Signed)
Could you contact her in regard to her lab result. Prolactin level is normal. TSH is elevated, although her free T4 is within normal range. I would advise she would contact her primary care for follow up.

## 2022-04-22 NOTE — Telephone Encounter (Signed)
left message for patient to call office back for lab test results.

## 2022-05-07 ENCOUNTER — Ambulatory Visit
Admission: EM | Admit: 2022-05-07 | Discharge: 2022-05-07 | Disposition: A | Discharge status: Home / Self Care | Payer: BC Managed Care – PPO | Attending: Physician Assistant | Admitting: Physician Assistant

## 2022-05-07 ENCOUNTER — Ambulatory Visit (INDEPENDENT_AMBULATORY_CARE_PROVIDER_SITE_OTHER): Payer: BC Managed Care – PPO

## 2022-05-07 ENCOUNTER — Encounter: Payer: Self-pay | Admitting: Emergency Medicine

## 2022-05-07 DIAGNOSIS — M79671 Pain in right foot: Secondary | ICD-10-CM

## 2022-05-07 NOTE — Discharge Instructions (Addendum)
-  Radiologist read your x-ray as no acute abnormalities. - I believe you may have bone spurs or a bunion.  It commonly occurs close to the big toe but can also happen about the little toe as well.  See handout. - Apply Voltaren gel and take Tylenol.  Try to wear well-fitting shoes. - I did place a referral to podiatry for you.  Sometimes injections help and other times surgery may be necessary.

## 2022-05-07 NOTE — ED Provider Notes (Signed)
[facility]    CSN: [id number] Arrival date & time: 05/07/22  1323      History   Chief Complaint Chief Complaint  Patient presents with   Foot Injury    Entered by patient    HPI Sharon Gibson is a 41 y.o. female presenting for 5-week history of atraumatic right foot pain at the base of the fifth digit.  She denies treatment of any kind.  She reports she thought it would just get better on its own.  She says she has a burning sensation over the skin and pain is worse when she walks.  No reports of numbness or weakness.  No similar problems the past.  HPI  Past Medical History:  Diagnosis Date   Anemia    Anxiety    Chicken pox    Depression    Eating disorder    Has had residential treatment and hospitalization previously.   Heart murmur    as a child   History of anemia    RECIEVES IRON INFUSIONS YEARLY   History of pulmonary embolus (PE) 2002   bilaterally-followed by [facility] hematology-w/u negative per pt   Hypothyroidism    no meds currently   PTSD (post-traumatic stress disorder)     Patient Active Problem List   Diagnosis Date Noted   Unspecified abnormal involuntary movements 03/12/2022   Personality disorder (HCC) 03/12/2022   Vasomotor rhinitis 02/12/2022   Idiopathic urticaria 02/12/2022   Chronic allergic conjunctivitis 02/12/2022   High risk medication use 02/12/2022   At risk for prolonged QT interval syndrome 02/12/2022   Lumbar radiculopathy 01/23/2022   Cervical strain 11/28/2019   Endometriosis determined by laparoscopy 06/13/2018   Ovarian cyst 03/01/2018   Hx pulmonary embolism 02/03/2018   Rash 10/16/2016   Generalized anxiety disorder 10/12/2016   Right knee pain 09/02/2016   Severe episode of recurrent major depressive disorder, without psychotic features (HCC) 07/23/2016   Bulimia nervosa, in partial remission, mild 07/23/2016    Class: Chronic   Iron deficiency anemia 04/08/2016   B12 deficiency 04/08/2016   S/P  gastric bypass 03/20/2016   History of eating disorder 03/20/2016   Insomnia 03/20/2016   PTSD (post-traumatic stress disorder) 03/20/2016   Hypothyroidism 03/20/2016   PCOS (polycystic ovarian syndrome) 12/14/2014    Past Surgical History:  Procedure Laterality Date   CHOLECYSTECTOMY  2003   EXCISION OF ENDOMETRIOMA     GASTRIC BYPASS  2003   LAPAROSCOPIC OVARIAN CYSTECTOMY Left 05/10/2018   Procedure: LAPAROSCOPIC OVARIAN CYSTECTOMY;  Surgeon: Natale Milch, MD;  Location: [facility] ORS;  Service: Gynecology;  Laterality: Left;    OB History     Gravida  0   Para  0   Term  0   Preterm  0   AB  0   Living  0      SAB  0   IAB  0   Ectopic  0   Multiple  0   Live Births  0            Home Medications    Prior to Admission medications   Medication Sig Start Date End Date Taking? Authorizing Provider  EPINEPHrine 0.3 mg/0.3 mL IJ SOAJ injection Inject 0.3 mg into the muscle as needed for anaphylaxis. 07/11/20   [provider]  FLUoxetine (PROZAC) 10 MG capsule Take 1-2 capsules (10-20 mg total) by mouth as directed. Take 1 capsule daily with breakfast for 1 week and increase to 2 capsules daily  after that 04/20/22   Jomarie Longs, MD  gabapentin (NEURONTIN) 100 MG capsule Take 1 capsule (100 mg total) by mouth at bedtime. 09/15/21   Johnson, Megan P, DO  hydrOXYzine (ATARAX) 25 MG tablet Take 1 tablet (25 mg total) by mouth daily. At night 11/05/21   Olevia Perches P, DO  levothyroxine (SYNTHROID) 125 MCG tablet Take 1 tablet (125 mcg total) by mouth daily. 05/05/21   Johnson, Megan P, DO  prazosin (MINIPRESS) 1 MG capsule Take 1 capsule (1 mg total) by mouth at bedtime. 04/20/22   Jomarie Longs, MD  sertraline (ZOLOFT) 100 MG tablet Take 1 tablet (100 mg total) by mouth as directed. Take sertraline 100 mg for 1 week, and then 50 mg for 1 week and stop 04/20/22   Jomarie Longs, MD  Vitamin D, Ergocalciferol, (DRISDOL) 1.25 MG (50000 UNIT) CAPS  capsule Take 1 capsule (50,000 Units total) by mouth every 7 (seven) days. 11/05/21   Dorcas Carrow, DO    Family History Family History  Problem Relation Age of Onset   Depression Father    Diabetes Father    Anxiety disorder Father    Alzheimer's disease Maternal Grandfather    Alzheimer's disease Maternal Grandmother    Prostate cancer Paternal Grandfather    Lung cancer Paternal Grandmother     Social History Social History   Tobacco Use   Smoking status: Never   Smokeless tobacco: Never  Vaping Use   Vaping Use: Never used  Substance Use Topics   Alcohol use: Not Currently    Alcohol/week: 0.0 - 1.0 standard drinks of alcohol    Comment: rare   Drug use: No     Allergies   Penicillins, Strawberry (diagnostic), Other, and Azithromycin   Review of Systems Review of Systems  Musculoskeletal:  Positive for arthralgias and joint swelling. Negative for gait problem.  Skin:  Negative for color change and wound.  Neurological:  Negative for weakness and numbness.     Physical Exam Triage Vital Signs ED Triage Vitals  Enc Vitals Group     BP 05/07/22 1346 113/78     Pulse Rate 05/07/22 1346 88     Resp 05/07/22 1346 16     Temp 05/07/22 1346 98.1 F (36.7 C)     Temp Source 05/07/22 1346 Oral     SpO2 05/07/22 1346 99 %     Weight --      Height 05/07/22 1344 5\' 3"  (1.6 m)     Head Circumference --      Peak Flow --      Pain Score 05/07/22 1344 7     Pain Loc --      Pain Edu? --      Excl. in GC? --    No data found.  Updated Vital Signs BP 113/78 (BP Location: Left Wrist)   Pulse 88   Temp 98.1 F (36.7 C) (Oral)   Resp 16   Ht 5\' 3"  (1.6 m)   SpO2 99%   BMI 47.72 kg/m     Physical Exam Vitals and nursing note reviewed.  Constitutional:      General: She is not in acute distress.    Appearance: Normal appearance. She is not ill-appearing or toxic-appearing.  HENT:     Head: Normocephalic and atraumatic.  Eyes:     General: No  scleral icterus.       Right eye: No discharge.        Left eye: No  discharge.     Conjunctiva/sclera: Conjunctivae normal.  Cardiovascular:     Rate and Rhythm: Normal rate.     Pulses: Normal pulses.  Pulmonary:     Effort: Pulmonary effort is normal. No respiratory distress.  Musculoskeletal:     Cervical back: Neck supple.     Right foot: Normal range of motion. Swelling (mild swelling head of 5th metatarsal) and bony tenderness (5th metatarsal) present. Normal pulse.  Skin:    General: Skin is dry.  Neurological:     General: No focal deficit present.     Mental Status: She is alert. Mental status is at baseline.     Motor: No weakness.     Gait: Gait normal.  Psychiatric:        Mood and Affect: Mood normal.        Behavior: Behavior normal.        Thought Content: Thought content normal.      UC Treatments / Results  Labs (all labs ordered are listed, but only abnormal results are displayed) Labs Reviewed - No data to display  EKG   Radiology DG Foot Complete Right  Result Date: 05/07/2022 CLINICAL DATA:  Right foot pain after injury 5 weeks ago. EXAM: RIGHT FOOT COMPLETE - 3+ VIEW COMPARISON:  None Available. FINDINGS: There is no evidence of fracture or dislocation. There is no evidence of arthropathy or other focal bone abnormality. Soft tissues are unremarkable. IMPRESSION: Negative. Electronically Signed   By: Lupita Raider M.D.   On: 05/07/2022 14:26    Procedures Procedures (including critical care time)  Medications Ordered in UC Medications - No data to display  Initial Impression / Assessment and Plan / UC Course  I have reviewed the triage vital signs and the nursing notes.  Pertinent labs & imaging results that were available during my care of the patient were reviewed by me and considered in my medical decision making (see chart for details).  41 year old female presenting for right foot pain for the past 5 weeks.  Denies injury.  X-ray  obtained today reveals no evidence of arthropathy or other focal bony abnormality and soft tissues are found to be unremarkable per radiologist.  Correlating the image to the area where patient has pain, she does appear to have spurring at the head of the fifth metatarsal and this is exactly where her pain and swelling are located.  She also has heel spurs.  Advised patient on cryotherapy.  We also discussed getting proper fitting shoes.  I suggested Tylenol for pain relief and trying Voltaren gel.  She avoids anti-inflammatory medications as she has had gastric bypass surgery.  Patient is interested in referral to podiatry since her symptoms have been ongoing for 5 weeks.  Placed referral to [facility].   Final Clinical Impressions(s) / UC Diagnoses   Final diagnoses:  Right foot pain     Discharge Instructions      -Radiologist read your x-ray as no acute abnormalities. - I believe you may have bone spurs or a bunion.  It commonly occurs close to the big toe but can also happen about the little toe as well.  See handout. - Apply Voltaren gel and take Tylenol.  Try to wear well-fitting shoes. - I did place a referral to podiatry for you.  Sometimes injections help and other times surgery may be necessary.     ED Prescriptions   None    PDMP not reviewed this encounter.  Shirlee Latch, PA-C 05/07/22 1451

## 2022-05-07 NOTE — ED Triage Notes (Signed)
Pt presents with a bump on her right foot x 5 weeks. The bump is painful to touch and she describes as a burning sensation and hurts when she walks.

## 2022-05-13 ENCOUNTER — Encounter: Payer: Self-pay | Admitting: Podiatry

## 2022-05-13 ENCOUNTER — Ambulatory Visit: Payer: BC Managed Care – PPO | Admitting: Podiatry

## 2022-05-13 DIAGNOSIS — M7751 Other enthesopathy of right foot: Secondary | ICD-10-CM

## 2022-05-13 MED ORDER — DEXAMETHASONE SODIUM PHOSPHATE 120 MG/30ML IJ SOLN
2.0000 mg | Freq: Once | INTRAMUSCULAR | Status: AC
  Administered 2022-05-13: 2 mg via INTRA_ARTICULAR

## 2022-05-13 NOTE — Progress Notes (Signed)
Subjective:  Patient ID: Sharon Gibson, female    DOB: January 07, 1981,  MRN: [id number] HPI Chief Complaint  Patient presents with   Foot Pain    5th MPJ right - aching, swelling x 2 months, Urgent Care xrayed-referred here, wearing open shoes, numbness, swelling is in most of dorsal forefoot by end of day   New Patient (Initial Visit)    41 y.o. female presents with the above complaint.   ROS: Denies fever chills nausea vomiting muscle aches pains calf pain back pain chest pain shortness of breath.  Past Medical History:  Diagnosis Date   Anemia    Anxiety    Chicken pox    Depression    Eating disorder    Has had residential treatment and hospitalization previously.   Heart murmur    as a child   History of anemia    RECIEVES IRON INFUSIONS YEARLY   History of pulmonary embolus (PE) 2002   bilaterally-followed by [facility] hematology-w/u negative per pt   Hypothyroidism    no meds currently   PTSD (post-traumatic stress disorder)    Past Surgical History:  Procedure Laterality Date   CHOLECYSTECTOMY  2003   EXCISION OF ENDOMETRIOMA     GASTRIC BYPASS  2003   LAPAROSCOPIC OVARIAN CYSTECTOMY Left 05/10/2018   Procedure: LAPAROSCOPIC OVARIAN CYSTECTOMY;  Surgeon: Natale Milch, MD;  Location: [facility] ORS;  Service: Gynecology;  Laterality: Left;    Current Outpatient Medications:    EPINEPHrine 0.3 mg/0.3 mL IJ SOAJ injection, Inject 0.3 mg into the muscle as needed for anaphylaxis., Disp: , Rfl:    FLUoxetine (PROZAC) 10 MG capsule, Take 1-2 capsules (10-20 mg total) by mouth as directed. Take 1 capsule daily with breakfast for 1 week and increase to 2 capsules daily after that, Disp: 60 capsule, Rfl: 0   gabapentin (NEURONTIN) 100 MG capsule, Take 1 capsule (100 mg total) by mouth at bedtime., Disp: 90 capsule, Rfl: 1   hydrOXYzine (ATARAX) 25 MG tablet, Take 1 tablet (25 mg total) by mouth daily. At night, Disp: 90 tablet, Rfl: 1   levothyroxine (SYNTHROID) 125 MCG  tablet, Take 1 tablet (125 mcg total) by mouth daily., Disp: 90 tablet, Rfl: 3   prazosin (MINIPRESS) 1 MG capsule, Take 1 capsule (1 mg total) by mouth at bedtime., Disp: 90 capsule, Rfl: 1   sertraline (ZOLOFT) 100 MG tablet, Take 1 tablet (100 mg total) by mouth as directed. Take sertraline 100 mg for 1 week, and then 50 mg for 1 week and stop, Disp: 30 tablet, Rfl: 0   Vitamin D, Ergocalciferol, (DRISDOL) 1.25 MG (50000 UNIT) CAPS capsule, Take 1 capsule (50,000 Units total) by mouth every 7 (seven) days., Disp: 12 capsule, Rfl: 1  Allergies  Allergen Reactions   Penicillins Hives    Has patient had a PCN reaction causing immediate rash, facial/tongue/throat swelling, SOB or lightheadedness with hypotension: yes Has patient had a PCN reaction causing severe rash involving mucus membranes or skin necrosis: no Has patient had a PCN reaction that required hospitalization: yes Has patient had a PCN reaction occurring within the last 10 years: no If all of the above answers are "NO", then may proceed with Cephalosporin use.    Strawberry (Diagnostic) Swelling    AND WALNUTS-TONGUE SWELLING   Other Other (See Comments)    Narcotics- Burning in the stomach (whether PO or IV)   Azithromycin Nausea Only   Review of Systems Objective:  There were no vitals filed for this visit.  General: Well developed, nourished, in no acute distress, alert and oriented x3   Dermatological: Skin is warm, dry and supple bilateral. Nails x 10 are well maintained; remaining integument appears unremarkable at this time. There are no open sores, no preulcerative lesions, no rash or signs of infection present.  Vascular: Dorsalis Pedis artery and Posterior Tibial artery pedal pulses are 2/4 bilateral with immedate capillary fill time. Pedal hair growth present. No varicosities and no lower extremity edema present bilateral.   Neruologic: Grossly intact via light touch bilateral. Vibratory intact via tuning fork  bilateral. Protective threshold with Semmes Wienstein monofilament intact to all pedal sites bilateral. Patellar and Achilles deep tendon reflexes 2+ bilateral. No Babinski or clonus noted bilateral.   Musculoskeletal: No gross boney pedal deformities bilateral. No pain, crepitus, or limitation noted with foot and ankle range of motion bilateral. Muscular strength 5/5 in all groups tested bilateral.  Pain and swelling overlying the fifth metatarsal head of the right foot exquisitely tender on palpation there is some mild edema overlying the lesser metatarsals moving medially on the right foot.  Gait: Unassisted, Nonantalgic.    Radiographs:  Radiographs were reviewed today demonstrating a tailor's bunion deformity with a laterally positioned exostosis just at the metaphyseal region of the metatarsal.  Assessment & Plan:   Assessment: Bursitis capsulitis tailor bunion deformity right exostosis fifth metatarsal right  Plan: Discussed etiology pathology conservative surgical therapies at this point were going to try different shoe gear or augmenting the shoe gear that she has.  We also injected the bursa today with dexamethasone local anesthetic she tolerated procedure well and I will follow-up with her in a couple of weeks if this is not improving.     Lavene Penagos T. [place], [place]

## 2022-05-18 ENCOUNTER — Inpatient Hospital Stay: Payer: BC Managed Care – PPO | Attending: Oncology

## 2022-05-18 ENCOUNTER — Other Ambulatory Visit: Payer: Self-pay | Admitting: *Deleted

## 2022-05-18 DIAGNOSIS — E538 Deficiency of other specified B group vitamins: Secondary | ICD-10-CM | POA: Insufficient documentation

## 2022-05-18 DIAGNOSIS — D508 Other iron deficiency anemias: Secondary | ICD-10-CM | POA: Insufficient documentation

## 2022-05-18 DIAGNOSIS — D509 Iron deficiency anemia, unspecified: Secondary | ICD-10-CM

## 2022-05-18 LAB — CBC WITH DIFFERENTIAL/PLATELET
Abs Immature Granulocytes: 0.04 10*3/uL (ref 0.00–0.07)
Basophils Absolute: 0.1 10*3/uL (ref 0.0–0.1)
Basophils Relative: 1 %
Eosinophils Absolute: 0.3 10*3/uL (ref 0.0–0.5)
Eosinophils Relative: 3 %
HCT: 41.5 % (ref 36.0–46.0)
Hemoglobin: 13.6 g/dL (ref 12.0–15.0)
Immature Granulocytes: 0 %
Lymphocytes Relative: 27 %
Lymphs Abs: 2.4 10*3/uL (ref 0.7–4.0)
MCH: 28.6 pg (ref 26.0–34.0)
MCHC: 32.8 g/dL (ref 30.0–36.0)
MCV: 87.4 fL (ref 80.0–100.0)
Monocytes Absolute: 0.5 10*3/uL (ref 0.1–1.0)
Monocytes Relative: 5 %
Neutro Abs: 5.6 10*3/uL (ref 1.7–7.7)
Neutrophils Relative %: 64 %
Platelets: 279 10*3/uL (ref 150–400)
RBC: 4.75 MIL/uL (ref 3.87–5.11)
RDW: 13.6 % (ref 11.5–15.5)
WBC: 8.9 10*3/uL (ref 4.0–10.5)
nRBC: 0 % (ref 0.0–0.2)

## 2022-05-18 LAB — IRON AND TIBC
Iron: 73 ug/dL (ref 28–170)
Saturation Ratios: 20 % (ref 10.4–31.8)
TIBC: 371 ug/dL (ref 250–450)
UIBC: 298 ug/dL

## 2022-05-18 LAB — VITAMIN B12: Vitamin B-12: 267 pg/mL (ref 180–914)

## 2022-05-18 LAB — FERRITIN: Ferritin: 27 ng/mL (ref 11–307)

## 2022-05-19 ENCOUNTER — Inpatient Hospital Stay: Payer: BC Managed Care – PPO

## 2022-05-19 ENCOUNTER — Inpatient Hospital Stay: Payer: BC Managed Care – PPO | Admitting: Oncology

## 2022-05-19 ENCOUNTER — Encounter: Payer: Self-pay | Admitting: Oncology

## 2022-05-19 VITALS — BP 103/57 | HR 58 | Temp 97.0°F | Resp 17

## 2022-05-19 VITALS — BP 106/55 | HR 68 | Temp 96.5°F | Resp 18 | Wt 271.7 lb

## 2022-05-19 DIAGNOSIS — E538 Deficiency of other specified B group vitamins: Secondary | ICD-10-CM

## 2022-05-19 DIAGNOSIS — D508 Other iron deficiency anemias: Secondary | ICD-10-CM | POA: Diagnosis not present

## 2022-05-19 DIAGNOSIS — D509 Iron deficiency anemia, unspecified: Secondary | ICD-10-CM

## 2022-05-19 DIAGNOSIS — Z9884 Bariatric surgery status: Secondary | ICD-10-CM | POA: Diagnosis not present

## 2022-05-19 MED ORDER — ACETAMINOPHEN 325 MG PO TABS
650.0000 mg | ORAL_TABLET | Freq: Once | ORAL | Status: AC
  Administered 2022-05-19: 650 mg via ORAL
  Filled 2022-05-19: qty 2

## 2022-05-19 MED ORDER — DIPHENHYDRAMINE HCL 25 MG PO CAPS
50.0000 mg | ORAL_CAPSULE | Freq: Once | ORAL | Status: AC
  Administered 2022-05-19: 50 mg via ORAL
  Filled 2022-05-19: qty 2

## 2022-05-19 MED ORDER — SODIUM CHLORIDE 0.9 % IV SOLN
200.0000 mg | Freq: Once | INTRAVENOUS | Status: AC
  Administered 2022-05-19: 200 mg via INTRAVENOUS
  Filled 2022-05-19: qty 200

## 2022-05-19 MED ORDER — SODIUM CHLORIDE 0.9 % IV SOLN
10.0000 mg | Freq: Once | INTRAVENOUS | Status: AC
  Administered 2022-05-19: 10 mg via INTRAVENOUS
  Filled 2022-05-19: qty 1

## 2022-05-19 MED ORDER — CYANOCOBALAMIN 1000 MCG/ML IJ SOLN
1000.0000 ug | Freq: Once | INTRAMUSCULAR | Status: AC
  Administered 2022-05-19: 1000 ug via INTRAMUSCULAR
  Filled 2022-05-19: qty 1

## 2022-05-19 MED ORDER — SODIUM CHLORIDE 0.9 % IV SOLN
Freq: Once | INTRAVENOUS | Status: AC
  Filled 2022-05-19: qty 250

## 2022-05-19 NOTE — Progress Notes (Unsigned)
Pt here for follow up. No new concerns voiced.   

## 2022-05-20 ENCOUNTER — Encounter: Payer: Self-pay | Admitting: Oncology

## 2022-05-20 NOTE — Progress Notes (Signed)
Hematology/Oncology Progress note [phone] [phone]      Clinic Day:  05/20/2022  Referring physician: Dorcas Carrow, DO  Chief Complaint: Sharon Gibson is a 41 y.o. female presents for follow up of  iron and B12 deficiency   PERTINENT HEMATOLOGY HISTORY Patient previously followed up by Dr.Corcoran, patient switched care to me on 05/08/21 Extensive medical record review was performed by me  #Patient has a history of gastric bypass. #Iron deficiency anemia, she has received IV iron (different preparations) x 5 ([place], [place]; [place], [place];  [place], [place]) with reactions requiring Benadryl and steroids. Per patient she had side effects from IV Venofer treatments, including dizziness, lightheadedness, low blood pressure, excess sweating, aches, and pains, nausea, and headache after her infusion. She has received Feraheme treatments as well as Venofer treatments previously.  Most recently Venofer treatments.  Patient gets Benadryl, steroids as premed to minimize adverse reactions.  #She has B12 deficiency.  Patient was previously on monthly vitamin B12 injections. Due to her work, she has not been able to come to get monthly injections.   #History of multiple pulmonary emboli in 2003.  Per patient report, hypercoagulable work-up was negative.  She was on Coumadin x 6 months.  Not currently on anticoagulation.   INTERVAL HISTORY Sharon Gibson is a 41 y.o. female who has above history reviewed by me not presents for follow up visit for management of iron deficiency anemia and vitamin B12 deficiency Some fatigue, chronic, no other new complaints. Not taking sublingual vitamin B12 supplementation.  Past Medical History:  Diagnosis Date   Anemia    Anxiety    Chicken pox    Depression    Eating disorder    Has had residential treatment and hospitalization previously.   Heart murmur    as a child   History of anemia    RECIEVES IRON INFUSIONS  YEARLY   History of pulmonary embolus (PE) 2002   bilaterally-followed by [facility] hematology-w/u negative per pt   Hypothyroidism    no meds currently   PTSD (post-traumatic stress disorder)     Past Surgical History:  Procedure Laterality Date   CHOLECYSTECTOMY  2003   EXCISION OF ENDOMETRIOMA     GASTRIC BYPASS  2003   LAPAROSCOPIC OVARIAN CYSTECTOMY Left 05/10/2018   Procedure: LAPAROSCOPIC OVARIAN CYSTECTOMY;  Surgeon: Natale Milch, MD;  Location: [facility] ORS;  Service: Gynecology;  Laterality: Left;    Family History  Problem Relation Age of Onset   Depression Father    Diabetes Father    Anxiety disorder Father    Alzheimer's disease Maternal Grandfather    Alzheimer's disease Maternal Grandmother    Prostate cancer Paternal Grandfather    Lung cancer Paternal Grandmother     Social History:  reports that she has never smoked. She has never used smokeless tobacco. She reports that she does not currently use alcohol. She reports that she does not use drugs.   Allergies:  Allergies  Allergen Reactions   Penicillins Hives    Has patient had a PCN reaction causing immediate rash, facial/tongue/throat swelling, SOB or lightheadedness with hypotension: yes Has patient had a PCN reaction causing severe rash involving mucus membranes or skin necrosis: no Has patient had a PCN reaction that required hospitalization: yes Has patient had a PCN reaction occurring within the last 10 years: no If all of the above answers are "NO", then may proceed with Cephalosporin use.    Strawberry (Diagnostic) Swelling    AND WALNUTS-TONGUE SWELLING  Other Other (See Comments)    Narcotics- Burning in the stomach (whether PO or IV)   Azithromycin Nausea Only    Current Medications: Current Outpatient Medications  Medication Sig Dispense Refill   gabapentin (NEURONTIN) 100 MG capsule Take 1 capsule (100 mg total) by mouth at bedtime. 90 capsule 1   hydrOXYzine (ATARAX) 25 MG tablet  Take 1 tablet (25 mg total) by mouth daily. At night 90 tablet 1   levothyroxine (SYNTHROID) 125 MCG tablet Take 1 tablet (125 mcg total) by mouth daily. 90 tablet 3   sertraline (ZOLOFT) 100 MG tablet Take 1 tablet (100 mg total) by mouth as directed. Take sertraline 100 mg for 1 week, and then 50 mg for 1 week and stop 30 tablet 0   Vitamin D, Ergocalciferol, (DRISDOL) 1.25 MG (50000 UNIT) CAPS capsule Take 1 capsule (50,000 Units total) by mouth every 7 (seven) days. 12 capsule 1   EPINEPHrine 0.3 mg/0.3 mL IJ SOAJ injection Inject 0.3 mg into the muscle as needed for anaphylaxis. (Patient not taking: Reported on 05/19/2022)     No current facility-administered medications for this visit.    Review of Systems  Constitutional:  Negative for chills, fever and malaise/fatigue.  HENT:  Negative for sore throat.   Eyes:  Negative for redness.  Respiratory:  Negative for cough, shortness of breath and wheezing.   Cardiovascular:  Negative for chest pain, palpitations and leg swelling.  Gastrointestinal:  Negative for abdominal pain, blood in stool, nausea and vomiting.  Genitourinary:  Negative for dysuria.  Musculoskeletal:  Negative for myalgias.  Skin:  Negative for rash.  Neurological:  Negative for dizziness, tingling and tremors.  Endo/Heme/Allergies:  Does not bruise/bleed easily.  Psychiatric/Behavioral:  Negative for hallucinations.    Performance status (ECOG): 1  Vitals Blood pressure (!) 106/55, pulse 68, temperature (!) 96.5 F (35.8 C), resp. rate 18, weight 271 lb 11.2 oz (123.2 kg).   Physical Exam Constitutional:      General: She is not in acute distress.    Appearance: She is well-developed. She is not diaphoretic.  HENT:     Head: Normocephalic and atraumatic.  Eyes:     General: No scleral icterus.    Pupils: Pupils are equal, round, and reactive to light.  Neck:     Vascular: No JVD.  Cardiovascular:     Rate and Rhythm: Normal rate and regular rhythm.      Heart sounds: No murmur heard. Pulmonary:     Effort: Pulmonary effort is normal. No respiratory distress.     Breath sounds: Normal breath sounds.  Abdominal:     General: Bowel sounds are normal. There is no distension.     Palpations: Abdomen is soft.  Musculoskeletal:        General: No swelling or tenderness. Normal range of motion.     Cervical back: Normal range of motion and neck supple.  Lymphadenopathy:     Head:     Right side of head: No preauricular, posterior auricular or occipital adenopathy.     Left side of head: No preauricular, posterior auricular or occipital adenopathy.     Cervical: No cervical adenopathy.     Upper Body:     Right upper body: No supraclavicular adenopathy.     Left upper body: No supraclavicular adenopathy.     Lower Body: No right inguinal adenopathy. No left inguinal adenopathy.  Skin:    General: Skin is warm and dry.     Coloration:  Skin is not pale.     Findings: No erythema or rash.  Neurological:     Mental Status: She is alert and oriented to person, place, and time.  Psychiatric:        Mood and Affect: Mood normal.    Laboratory studies    Latest Ref Rng & Units 05/18/2022    3:00 PM 11/17/2021    2:55 PM 05/07/2021    1:04 PM  CBC  WBC 4.0 - 10.5 K/uL 8.9  9.2  9.7   Hemoglobin 12.0 - 15.0 g/dL 13.6  13.6  14.3   Hematocrit 36.0 - 46.0 % 41.5  41.8  43.9   Platelets 150 - 400 K/uL 279  279  276       Latest Ref Rng & Units 04/03/2021    4:25 PM 08/16/2020    9:05 AM 02/01/2020    8:34 AM  CMP  Glucose 65 - 99 mg/dL 93  80  79   BUN 6 - 24 mg/dL 23  12  19    Creatinine 0.57 - 1.00 mg/dL 0.83  0.81  0.68   Sodium 134 - 144 mmol/L 139  140  140   Potassium 3.5 - 5.2 mmol/L 4.6  4.6  4.6   Chloride 96 - 106 mmol/L 103  105  106   CO2 20 - 29 mmol/L 20  20  21    Calcium 8.7 - 10.2 mg/dL 10.1  9.0  9.5   Total Protein 6.0 - 8.5 g/dL 6.9  6.2  6.2   Total Bilirubin 0.0 - 1.2 mg/dL 0.2  0.3  0.3   Alkaline Phos 44 - 121 IU/L  97  93  91   AST 0 - 40 IU/L 16  16  15    ALT 0 - 32 IU/L 15  17  14     Lab Results  Component Value Date   IRON 73 05/18/2022   TIBC 371 05/18/2022   FERRITIN 27 05/18/2022     Assessment and Plan. 1. Other iron deficiency anemia   2. B12 deficiency   3. History of gastric bypass    #Iron deficiency in the context of history of gastric bypass Labs reviewed and discussed with patient. Hemoglobin and iron panel are both stable. In the context of gastric bypass, Recommend IV Venofer 200 mg x 1.  Patient declines pregnancy testing due to no chance of pregnancy.  She reports that she is currently not sexually active. Patient gets Tylenol 650 mg p.o. x1, Benadryl 50 mg p.o. x1, Decadron 10 mg x 1 as premed prior to Venofer treatment to minimize infusion reactions    #History of vitamin B12 deficiency. Recommend to resuming monthly vitamin B12 injections. She prefers to receive B12 injections during her visits and take sublingual vitamin B12 1000 mcg daily at home. B12 injection x1 today. Repeat B12 at the next visit.  RTC in 6 months for MD assess, labs [facility] with diff, ferritin, iron studies, B12] +/- Venofer.  +/- B12 injection.  I discussed the assessment and treatment plan with the patient.  The patient was provided an opportunity to ask questions and all were answered.  The patient agreed with the plan and demonstrated an understanding of the instructions.  The patient was advised to call back if the symptoms worsen or if the condition fails to improve as anticipated.  Earlie Server, MD, PhD Hematology Oncology 05/20/2022

## 2022-05-25 ENCOUNTER — Telehealth: Payer: Self-pay | Admitting: Psychiatry

## 2022-05-25 NOTE — Telephone Encounter (Signed)
Patient left voice message 05-25-22 stating to cancel her appointment on 05-26-22. She has found a new provider psychiatrist.

## 2022-05-26 ENCOUNTER — Ambulatory Visit: Payer: BC Managed Care – PPO | Admitting: Psychiatry

## 2022-06-02 ENCOUNTER — Encounter: Payer: Self-pay | Admitting: Podiatry

## 2022-06-03 ENCOUNTER — Emergency Department
Admission: EM | Admit: 2022-06-03 | Discharge: 2022-06-03 | Disposition: A | Discharge status: Home / Self Care | Payer: BC Managed Care – PPO | Attending: Emergency Medicine | Admitting: Emergency Medicine

## 2022-06-03 ENCOUNTER — Emergency Department: Payer: BC Managed Care – PPO

## 2022-06-03 ENCOUNTER — Encounter: Payer: Self-pay | Admitting: Medical Oncology

## 2022-06-03 DIAGNOSIS — E039 Hypothyroidism, unspecified: Secondary | ICD-10-CM | POA: Diagnosis not present

## 2022-06-03 DIAGNOSIS — M79671 Pain in right foot: Secondary | ICD-10-CM

## 2022-06-03 DIAGNOSIS — M5431 Sciatica, right side: Secondary | ICD-10-CM | POA: Insufficient documentation

## 2022-06-03 LAB — URIC ACID: Uric Acid, Serum: 4.8 mg/dL (ref 2.5–7.1)

## 2022-06-03 LAB — CBC
HCT: 38.5 % (ref 36.0–46.0)
Hemoglobin: 12.3 g/dL (ref 12.0–15.0)
MCH: 28 pg (ref 26.0–34.0)
MCHC: 31.9 g/dL (ref 30.0–36.0)
MCV: 87.7 fL (ref 80.0–100.0)
Platelets: 283 10*3/uL (ref 150–400)
RBC: 4.39 MIL/uL (ref 3.87–5.11)
RDW: 13.5 % (ref 11.5–15.5)
WBC: 6.5 10*3/uL (ref 4.0–10.5)
nRBC: 0 % (ref 0.0–0.2)

## 2022-06-03 LAB — SEDIMENTATION RATE: Sed Rate: 8 mm/hr (ref 0–20)

## 2022-06-03 MED ORDER — PREDNISONE 50 MG PO TABS: 50.0000 mg | ORAL_TABLET | Freq: Every day | ORAL | 0 refills | Status: AC

## 2022-06-03 MED ORDER — NAPROXEN 500 MG PO TABS
500.0000 mg | ORAL_TABLET | Freq: Two times a day (BID) | ORAL | 0 refills | Status: AC
Start: 2022-06-03 — End: 2022-06-18

## 2022-06-03 MED ORDER — CYCLOBENZAPRINE HCL 10 MG PO TABS: 10.0000 mg | ORAL_TABLET | Freq: Three times a day (TID) | ORAL | 0 refills | Status: AC | PRN

## 2022-06-03 NOTE — ED Notes (Signed)
Pt in xray

## 2022-06-03 NOTE — ED Notes (Signed)
Dc instructions and scripts reviewed with pt no questions or concerns at this time.  

## 2022-06-03 NOTE — ED Triage Notes (Signed)
Pt reports rt foot pain (top of foot) x 2 months that has worsened. Pt reports going to UC and to [facility] without relief.

## 2022-06-03 NOTE — Discharge Instructions (Addendum)
-  Please follow-up with podiatry, as discussed.  -You may wear the cam boot for comfort as needed.  -Take the full course of prednisone as prescribed.  You may additionally take naproxen as needed for pain.  You may take the cyclobenzaprine, which will help with sciatica, but be cautious as it may make you dizzy/drowsy.  -Return to the emergency department anytime if you begin to experience any new or worsening symptoms.

## 2022-06-03 NOTE — ED Provider Notes (Signed)
[facility] Provider Note    Event Date/Time   First MD Initiated Contact with Patient 06/03/22 [phone]     (approximate)   History   Chief Complaint Foot Pain   HPI Sharon Gibson is a 41 y.o. female, history of PTSD, hypothyroidism, GAD, PCOS, eating disorder, presents to the emergency department for evaluation of right-sided foot pain.  Patient states that has been going on for a few months now.  She was previously seen at urgent care, who did an x-ray and said that she had what appeared to be a bone spur along the head of the fifth metatarsal.  She was referred to podiatry.  She states that she has had her first appointment with podiatry, but states that they advised surgery for potential bone spurring.  However, she states that she is having more than just pain under her fifth digit.  She is also having pain along the mid dorsum of her foot as well, described as a burning sensation.  She additionally is having pain along the mid plantar aspect of her foot as well.  She states that it hurts to walk.  Denies fever/chills, leg pain, knee pain, numbness/tingling in the affected extremity, cold sensation in the affected extremity, myalgias, chest pain, shortness of breath, or abdominal pain.  History Limitations: No limitations.        Physical Exam  Triage Vital Signs: ED Triage Vitals  Enc Vitals Group     BP 06/03/22 0816 104/61     Pulse Rate 06/03/22 0816 78     Resp 06/03/22 0816 16     Temp 06/03/22 0816 98.1 F (36.7 C)     Temp Source 06/03/22 0816 Oral     SpO2 06/03/22 0816 98 %     Weight 06/03/22 0817 271 lb 2.7 oz (123 kg)     Height 06/03/22 0817 _0  (1.6 m)     Head Circumference --      Peak Flow --      Pain Score 06/03/22 0817 10     Pain Loc --      Pain Edu? --      Excl. in [place]? --     Most recent vital signs: Vitals:   06/03/22 0816 06/03/22 1103  BP: 104/61 110/60  Pulse: 78 80  Resp: 16 16  Temp: 98.1 F (36.7 C)  98.2 F (36.8 C)  SpO2: 98% 98%    General: Awake, NAD.  Skin: Warm, dry. No rashes or lesions.  Eyes: PERRL. Conjunctivae normal.  CV: Good peripheral perfusion.  Resp: Normal effort.  Abd: Soft, non-tender. No distention.  Neuro: At baseline. No gross neurological deficits.  Musculoskeletal: Normal ROM of all extremities.   Focused Exam: Mild erythema and swelling along the base of the fifth digit, right foot.  Positive straight leg test in the right lower extremity.  Tenderness along the plantar fascia with deep palpation.  Physical Exam    ED Results / Procedures / Treatments  Labs (all labs ordered are listed, but only abnormal results are displayed) Labs Reviewed  URIC ACID  CBC  SEDIMENTATION RATE     EKG N/A.   RADIOLOGY  ED Provider Interpretation: I personally reviewed and interpreted CT scan, no evidence of acute fractures.  CT Foot Right Wo Contrast  Result Date: 06/03/2022 CLINICAL DATA:  Right foot pain for 2 months. EXAM: CT OF THE RIGHT FOOT WITHOUT CONTRAST TECHNIQUE: Multidetector CT imaging of the right foot was performed  according to the standard protocol. Multiplanar CT image reconstructions were also generated. RADIATION DOSE REDUCTION: This exam was performed according to the departmental dose-optimization program which includes automated exposure control, adjustment of the mA and/or kV according to patient size and/or use of iterative reconstruction technique. COMPARISON:  None Available. FINDINGS: Bones/Joint/Cartilage No acute fracture or dislocation. Bipartite medial hallux sesamoid with subcortical cystic changes on either side of the medial hallux sesamoid fragments as can be seen with abnormal motion across the synchondrosis. Bipartite lateral hallux sesamoid versus previously fractured and ununited lateral hallux sesamoid. Mild arthritic changes at the lateral hallux sesamoid-metatarsal head articulation. Normal alignment. No joint effusion. Small  plantar calcaneal spur. Enthesopathic changes of the Achilles tendon insertion. Small anterior tibial marginal osteophyte at the tibiotalar joint. Ligaments Ligaments are suboptimally evaluated by CT. Muscles and Tendons Muscles are normal. No muscle atrophy. No intramuscular fluid collection or hematoma. Soft tissue No fluid collection or hematoma.  No soft tissue mass. IMPRESSION: 1. Bipartite medial hallux sesamoid with subcortical cystic changes on either side of the medial hallux sesamoid fragments as can be seen with abnormal motion across the synchondrosis. 2. Bipartite lateral hallux sesamoid versus previously fractured and ununited lateral hallux sesamoid. Mild arthritic changes at the lateral hallux sesamoid-metatarsal head articulation. Electronically Signed   By: Kathreen Devoid M.D.   On: 06/03/2022 09:25    PROCEDURES:  Critical Care performed: N/A.  Procedures    MEDICATIONS ORDERED IN ED: Medications - No data to display   IMPRESSION / MDM / [place] / ED COURSE  I reviewed the triage vital signs and the nursing notes.                              Differential diagnosis includes, but is not limited to, plantar fasciitis, gout, bone spur, metatarsal fracture, sciatica.  ED Course Patient appears well, vitals within normal limits.  NAD.  CBC shows no leukocytosis or anemia.  Uric acid unremarkable at 4.8.  ESR unremarkable at 8.  Assessment/Plan Patient presents with persistent pain in her right foot x3 months.  CT imaging does not show any acute findings along the areas of pain.  I suspect that her pain is likely coming from multiple etiologies.  I believe that the burning/stinging sensation that she is feeling along the dorsum of her foot extending into her lower leg is likely related to sciatica, as noted by positive straight leg test on the right lower extremity.  However, the pain along the plantar aspect of her midfoot may likely be due to plantar fasciitis.   In regards to the mild pain and swelling along the fifth metatarsal, this is of unclear etiology at this time, however patient did state that she responded well to steroids in the past.  We will provide her with a short-term prescription for prednisone.  Additionally provided her with cyclobenzaprine and naproxen.  Recommended that she follow-up with podiatry again for further evaluation.  Provided her with a boot for comfort.  Will discharge.  Provided the patient with anticipatory guidance, return precautions, and educational material. Encouraged the patient to return to the emergency department at any time if they begin to experience any new or worsening symptoms. Patient expressed understanding and agreed with the plan.   Patient's presentation is most consistent with acute complicated illness / injury requiring diagnostic workup.       FINAL CLINICAL IMPRESSION(S) / ED DIAGNOSES   Final diagnoses:  Foot pain, right  Sciatica of right side     Rx / DC Orders   ED Discharge Orders          Ordered    predniSONE (DELTASONE) 50 MG tablet  Daily with breakfast        06/03/22 1048    cyclobenzaprine (FLEXERIL) 10 MG tablet  3 times daily PRN        06/03/22 1048    naproxen (NAPROSYN) 500 MG tablet  2 times daily with meals        06/03/22 1048             Note:  This document was prepared using Dragon voice recognition software and may include unintentional dictation errors.   Teodoro Spray, [place] 06/03/22 1545    Vanessa [place], MD 06/04/22 [phone]

## 2022-06-10 ENCOUNTER — Ambulatory Visit: Payer: BC Managed Care – PPO | Admitting: Podiatry

## 2022-06-11 ENCOUNTER — Ambulatory Visit: Payer: BC Managed Care – PPO | Admitting: Podiatry

## 2022-06-11 ENCOUNTER — Ambulatory Visit: Payer: BC Managed Care – PPO | Admitting: Podiatrist

## 2022-06-12 ENCOUNTER — Ambulatory Visit: Payer: BC Managed Care – PPO | Admitting: Physician Assistant

## 2022-06-12 ENCOUNTER — Encounter: Payer: Self-pay | Admitting: Physician Assistant

## 2022-06-12 ENCOUNTER — Ambulatory Visit: Payer: Self-pay

## 2022-06-12 VITALS — BP 132/83 | HR 87 | Temp 98.5°F

## 2022-06-12 DIAGNOSIS — M79671 Pain in right foot: Secondary | ICD-10-CM

## 2022-06-12 NOTE — Progress Notes (Unsigned)
Established Patient Office Visit  Name: Sharon Gibson   MRN: [id number]    DOB: Jun 21, 1981   Date:06/15/2022  Today's Provider: Talitha Givens, MHS, PA-C Introduced myself to the patient as a PA-C and provided education on APPs in clinical practice.         Subjective  Chief Complaint  Chief Complaint  Patient presents with   Foot Pain    Right foot pain For past month, has been seen by ER x2, UC x2. Was last seen in [place] ER yesterday. Concerned about elevated D-dimer numbers (799) for blood clot since she states sh has had them before in her lungs.     Foot Pain Associated symptoms include chest pain (chronic and recurrent) and myalgias. Pertinent negatives include no coughing.    Right foot pain Pain level: 8/10 burning and dull achy  Reports a sheet rubbing over the area is very painful  Reports some pain in sole of foot - almost like it goes straight through from dorsum to sole  Interventions; She has had a course of steroids, flexeril  Exacerbated by walking  Was seen in ED yesterday- evaluated with Xray and [place] for DVT - negative for DVT per [place] results  Xrays did not reveal acute bony injury   States pain started in May - sudden, without trauma or injury to her knowledge Reports bruising along toes and dorsum of foot  She has been seen in UC, ED and evaluated by Podiatry  She has had steroid injection in bunion from Podiatry - did not provide relief  Reports she is having numbness from knee down - this started in the past 2 days Is concerned due to elevated D-dimers because she has had PE in the past Reports she has recurrent chest pain but this has been evaluated in the past and is typical  for her and she thinks it is secondary to anxiety   She has a referral to [place] which she is going to follow up with        Patient Active Problem List   Diagnosis Date Noted   Right foot pain 06/15/2022   Unspecified abnormal involuntary movements  03/12/2022   Personality disorder ([place]) 03/12/2022   Vasomotor rhinitis 02/12/2022   Idiopathic urticaria 02/12/2022   Chronic allergic conjunctivitis 02/12/2022   High risk medication use 02/12/2022   At risk for prolonged QT interval syndrome 02/12/2022   Lumbar radiculopathy 01/23/2022   Cervical strain 11/28/2019   Endometriosis determined by laparoscopy 06/13/2018   Ovarian cyst 03/01/2018   Hx pulmonary embolism 02/03/2018   Rash 10/16/2016   Generalized anxiety disorder 10/12/2016   Right knee pain 09/02/2016   Severe episode of recurrent major depressive disorder, without psychotic features ([place]) 07/23/2016   Bulimia nervosa, in partial remission, mild 07/23/2016    Class: Chronic   Iron deficiency anemia 04/08/2016   B12 deficiency 04/08/2016   S/P gastric bypass 03/20/2016   History of eating disorder 03/20/2016   Insomnia 03/20/2016   PTSD (post-traumatic stress disorder) 03/20/2016   Hypothyroidism 03/20/2016   PCOS (polycystic ovarian syndrome) 12/14/2014    Past Surgical History:  Procedure Laterality Date   CHOLECYSTECTOMY  2003   EXCISION OF ENDOMETRIOMA     GASTRIC BYPASS  2003   LAPAROSCOPIC OVARIAN CYSTECTOMY Left 05/10/2018   Procedure: LAPAROSCOPIC OVARIAN CYSTECTOMY;  Surgeon: Homero Fellers, MD;  Location: [facility] ORS;  Service: Gynecology;  Laterality: Left;    Family History  Problem Relation Age of Onset   Depression Father    Diabetes Father    Anxiety disorder Father    Alzheimer's disease Maternal Grandfather    Alzheimer's disease Maternal Grandmother    Prostate cancer Paternal Grandfather    Lung cancer Paternal Grandmother     Social History   Tobacco Use   Smoking status: Never   Smokeless tobacco: Never  Substance Use Topics   Alcohol use: Not Currently    Alcohol/week: 0.0 - 1.0 standard drinks of alcohol    Comment: rare     Current Outpatient Medications:    cyclobenzaprine (FLEXERIL) 10 MG tablet, TAKE 1 TABLET BY  MOUTH THREE TIMES DAILY AS NEEDED FOR UP TO 5 DAYS FOR MUSCLE SPASMS, Disp: , Rfl:    EPINEPHrine 0.3 mg/0.3 mL IJ SOAJ injection, Inject 0.3 mg into the muscle as needed for anaphylaxis., Disp: , Rfl:    gabapentin (NEURONTIN) 100 MG capsule, Take 1 capsule (100 mg total) by mouth at bedtime., Disp: 90 capsule, Rfl: 1   hydrOXYzine (ATARAX) 25 MG tablet, Take 1 tablet (25 mg total) by mouth daily. At night, Disp: 90 tablet, Rfl: 1   levothyroxine (SYNTHROID) 125 MCG tablet, Take 1 tablet (125 mcg total) by mouth daily., Disp: 90 tablet, Rfl: 3   sertraline (ZOLOFT) 100 MG tablet, Take 1 tablet (100 mg total) by mouth as directed. Take sertraline 100 mg for 1 week, and then 50 mg for 1 week and stop, Disp: 30 tablet, Rfl: 0   Vitamin D, Ergocalciferol, (DRISDOL) 1.25 MG (50000 UNIT) CAPS capsule, Take 1 capsule (50,000 Units total) by mouth every 7 (seven) days., Disp: 12 capsule, Rfl: 1   naproxen (NAPROSYN) 500 MG tablet, Take 1 tablet (500 mg total) by mouth 2 (two) times daily with a meal for 15 days. (Patient not taking: Reported on 06/12/2022), Disp: 30 tablet, Rfl: 0  Allergies  Allergen Reactions   Penicillins Hives    Has patient had a PCN reaction causing immediate rash, facial/tongue/throat swelling, SOB or lightheadedness with hypotension: yes Has patient had a PCN reaction causing severe rash involving mucus membranes or skin necrosis: no Has patient had a PCN reaction that required hospitalization: yes Has patient had a PCN reaction occurring within the last 10 years: no If all of the above answers are "NO", then may proceed with Cephalosporin use.    Strawberry (Diagnostic) Swelling    AND WALNUTS-TONGUE SWELLING   Other Other (See Comments)    Narcotics- Burning in the stomach (whether PO or IV)   Nsaids Other (See Comments)   Azithromycin Nausea Only    I personally reviewed active problem list, medication list, allergies, notes from last encounter, notes from last 3  encounters, lab results with the patient/caregiver today.   Review of Systems  Respiratory:  Negative for cough, hemoptysis, shortness of breath and wheezing.   Cardiovascular:  Positive for chest pain (chronic and recurrent) and leg swelling.  Musculoskeletal:  Positive for joint pain and myalgias. Negative for falls.  Neurological:  Positive for tingling.      Objective  Vitals:   06/12/22 1421  BP: 132/83  Pulse: 87  Temp: 98.5 F (36.9 C)  TempSrc: Oral  SpO2: 98%    There is no height or weight on file to calculate BMI.  Physical Exam Vitals reviewed.  Constitutional:      General: She is awake.     Appearance: Normal appearance. She is well-developed and well-groomed.  Cardiovascular:  Pulses:          Dorsalis pedis pulses are 2+ on the right side and 2+ on the left side.     Comments: Lower legs are symmetric in size and do not appear swollen or hard to palpation Denies tenderness to palpation in calves  Musculoskeletal:     Right foot: Normal range of motion.     Left foot: Normal range of motion.       Feet:  Feet:     Right foot:     Skin integrity: Erythema and callus present. No ulcer or blister.     Toenail Condition: Right toenails are normal.     Left foot:     Skin integrity: No ulcer or erythema.     Toenail Condition: Left toenails are normal.  Neurological:     Mental Status: She is alert.  Psychiatric:        Attention and Perception: Attention and perception normal.        Mood and Affect: Mood and affect normal.        Speech: Speech normal.        Behavior: Behavior normal. Behavior is cooperative.      Recent Results (from the past 2160 hour(s))  TSH     Status: Abnormal   Collection Time: 04/20/22 10:54 AM  Result Value Ref Range   TSH 7.889 (H) 0.350 - 4.500 uIU/mL    Comment: Performed by a 3rd Generation assay with a functional sensitivity of <=0.01 uIU/mL. Performed at [facility], [address]  [place], [place] [address]   T4, free     Status: None   Collection Time: 04/20/22 10:54 AM  Result Value Ref Range   Free T4 0.65 0.61 - 1.12 ng/dL    Comment: (NOTE) Biotin ingestion may interfere with free T4 tests. If the results are inconsistent with the TSH level, previous test results, or the clinical presentation, then consider biotin interference. If needed, order repeat testing after stopping biotin. Performed at [facility], [address] [place], [place] [address]   Prolactin     Status: None   Collection Time: 04/20/22 10:54 AM  Result Value Ref Range   Prolactin 17.0 4.8 - 23.3 ng/mL    Comment: (NOTE) Performed At: [facility] [place], [place] [id number] Rush Farmer MD [id number]   Hemoglobin A1c     Status: None   Collection Time: 04/20/22 10:54 AM  Result Value Ref Range   Hgb A1c MFr Bld 5.1 4.8 - 5.6 %    Comment: (NOTE) Pre diabetes:          5.7%-6.4%  Diabetes:              >6.4%  Glycemic control for   <7.0% adults with diabetes    Mean Plasma Glucose 99.67 mg/dL    Comment: Performed at [facility] [address], [place], [place] [address]  Vitamin B12     Status: None   Collection Time: 05/18/22  3:00 PM  Result Value Ref Range   Vitamin B-12 267 180 - 914 pg/mL    Comment: (NOTE) This assay is not validated for testing neonatal or myeloproliferative syndrome specimens for Vitamin B12 levels. Performed at [facility], [place] [address] [place], [place] [address]   Iron and TIBC     Status: None   Collection Time: 05/18/22  3:00 PM  Result Value Ref Range   Iron 73  28 - 170 ug/dL   TIBC 371 250 - 450 ug/dL   Saturation Ratios 20 10.4 - 31.8 %   UIBC 298 ug/dL    Comment: Performed at [facility], [address] [place], [place] [address]  Ferritin     Status: None   Collection Time: 05/18/22  3:00 PM  Result Value Ref Range   Ferritin 27 11 - 307 ng/mL    Comment:  Performed at [facility], [address] [place], [place] [address]  CBC with Differential/Platelet     Status: None   Collection Time: 05/18/22  3:00 PM  Result Value Ref Range   WBC 8.9 4.0 - 10.5 K/uL   RBC 4.75 3.87 - 5.11 MIL/uL   Hemoglobin 13.6 12.0 - 15.0 g/dL   HCT 41.5 36.0 - 46.0 %   MCV 87.4 80.0 - 100.0 fL   MCH 28.6 26.0 - 34.0 pg   MCHC 32.8 30.0 - 36.0 g/dL   RDW 13.6 11.5 - 15.5 %   Platelets 279 150 - 400 K/uL   nRBC 0.0 0.0 - 0.2 %   Neutrophils Relative % 64 %   Neutro Abs 5.6 1.7 - 7.7 K/uL   Lymphocytes Relative 27 %   Lymphs Abs 2.4 0.7 - 4.0 K/uL   Monocytes Relative 5 %   Monocytes Absolute 0.5 0.1 - 1.0 K/uL   Eosinophils Relative 3 %   Eosinophils Absolute 0.3 0.0 - 0.5 K/uL   Basophils Relative 1 %   Basophils Absolute 0.1 0.0 - 0.1 K/uL   Immature Granulocytes 0 %   Abs Immature Granulocytes 0.04 0.00 - 0.07 K/uL    Comment: Performed at [facility], [address] [place], [place] [address]  Uric acid     Status: None   Collection Time: 06/03/22  9:16 AM  Result Value Ref Range   Uric Acid, Serum 4.8 2.5 - 7.1 mg/dL    Comment: Performed at [facility], [address] [place], [place] [address]  CBC     Status: None   Collection Time: 06/03/22  9:16 AM  Result Value Ref Range   WBC 6.5 4.0 - 10.5 K/uL   RBC 4.39 3.87 - 5.11 MIL/uL   Hemoglobin 12.3 12.0 - 15.0 g/dL   HCT 38.5 36.0 - 46.0 %   MCV 87.7 80.0 - 100.0 fL   MCH 28.0 26.0 - 34.0 pg   MCHC 31.9 30.0 - 36.0 g/dL   RDW 13.5 11.5 - 15.5 %   Platelets 283 150 - 400 K/uL   nRBC 0.0 0.0 - 0.2 %    Comment: Performed at [facility], [address] [place], [place] [address]  Sedimentation rate     Status: None   Collection Time: 06/03/22  9:16 AM  Result Value Ref Range   Sed Rate 8 0 - 20 mm/hr    Comment: Performed at [facility], [address] [place], [place] [address]     PHQ2/9:    06/12/2022    2:27 PM 04/20/2022    11:31 AM 03/12/2022   11:30 AM 02/12/2022    9:12 AM 02/06/2022    4:03 PM  Depression screen PHQ 2/9  Decreased Interest 0    3  Down, Depressed, Hopeless 1    3  PHQ - 2 Score 1    6  Altered sleeping 2    3  Tired, decreased energy 2    3  Change in  appetite 1    3  Feeling bad or failure about yourself  2    3  Trouble concentrating 0    3  Moving slowly or fidgety/restless 0    0  Suicidal thoughts 1    2  PHQ-9 Score 9    23  Difficult doing work/chores Not difficult at all    Extremely dIfficult     Information is confidential and restricted. Go to Review Flowsheets to unlock data.      Fall Risk:    06/12/2022    2:27 PM 01/08/2022    2:39 PM 04/03/2021    3:44 PM 12/11/2019    4:06 PM 05/23/2019   12:57 PM  Fall Risk   Falls in the past year? 0 0 0 0 0  Number falls in past yr: 0 0 0 0 0  Injury with Fall? 0 0 0 0 0  Risk for fall due to : No Fall Risks No Fall Risks No Fall Risks    Follow up Falls evaluation completed Falls evaluation completed Falls evaluation completed        Functional Status Survey:      Assessment & Plan  Problem List Items Addressed This Visit       Other   Right foot pain - Primary    Acute, ongoing concern  She has been evaluated by Podiatry and was seen in ED recently for this She is trying to move care to a new Podiatrist for management but today is especially concerned due to elevated D-Dimer results and her hx of PE Reviewed labs and [place] results from ED visit- provided reassurance of D-dimer and [place] rule out for DVT at this time In regards to foot pain I suspect this may have neurological etiology- shingles or postherpetic neuralgia given HPI responses? She may also have bursitis or bunion over the area per Podiatry evaluation which may be aggravating nerve innervations in foot.  Recommend she keep her upcoming apt with new Podiatry provider for evaluation Follow up as needed and will continue collaboration with specialties.          No follow-ups on file.   I, Illana Nolting E Reniah Cottingham, PA-C, have reviewed all documentation for this visit. The documentation on 06/15/22 for the exam, diagnosis, procedures, and orders are all accurate and complete.   Jacquelin Hawking, MHS, PA-C [facility]

## 2022-06-12 NOTE — Telephone Encounter (Signed)
  Chief Complaint: Leg foot pain Symptoms:  Frequency:  Pertinent Negatives: Patient denies  Disposition: [] ED /[] Urgent Care (no appt availability in office) / [x] Appointment(In office/virtual)/ []  [facility]/ [] Home Care/ [] Refused Recommended Disposition /[] [facility]/ []  Follow-up with PCP Additional Notes: Pt has been seen 2x at ED and 2X at [facility] for leg pain. PT has hx of blood clots. Pt was examined for same and released. Pt is very worried bout the possibility of a clot. Pt wishes to discuss POC.    Reason for Disposition  Requesting regular office appointment  Answer Assessment - Initial Assessment Questions 1. REASON FOR CALL or QUESTION: "What is your reason for calling today?" or "How can I best help you?" or "What question do you have that I can help answer?"     PT has concerns regarding the possibility of a blood clot in her leg. Pt was seen 2 times at [facility] and 2 times at [facility]. PT needs to discuss results with provider.  Protocols used: Information Only Call - No Triage-A-AH

## 2022-06-15 DIAGNOSIS — M79671 Pain in right foot: Secondary | ICD-10-CM | POA: Insufficient documentation

## 2022-06-15 NOTE — Assessment & Plan Note (Addendum)
Acute, ongoing concern  She has been evaluated by Podiatry and was seen in ED recently for this She is trying to move care to a new Podiatrist for management but today is especially concerned due to elevated D-Dimer results and her hx of PE Reviewed labs and [place] results from ED visit- provided reassurance of D-dimer and [place] rule out for DVT at this time In regards to foot pain I suspect this may have neurological etiology- shingles or postherpetic neuralgia given HPI responses? She may also have bursitis or bunion over the area per Podiatry evaluation which may be aggravating nerve innervations in foot.  Recommend she keep her upcoming apt with new Podiatry provider for evaluation Follow up as needed and will continue collaboration with specialties.

## 2022-06-29 ENCOUNTER — Encounter: Payer: Self-pay | Admitting: Family Medicine

## 2022-06-29 ENCOUNTER — Ambulatory Visit: Payer: BC Managed Care – PPO | Admitting: Family Medicine

## 2022-06-29 VITALS — BP 124/73 | HR 73 | Temp 98.1°F

## 2022-06-29 DIAGNOSIS — F4310 Post-traumatic stress disorder, unspecified: Secondary | ICD-10-CM | POA: Diagnosis not present

## 2022-06-29 DIAGNOSIS — F411 Generalized anxiety disorder: Secondary | ICD-10-CM | POA: Diagnosis not present

## 2022-06-29 DIAGNOSIS — M79671 Pain in right foot: Secondary | ICD-10-CM

## 2022-06-29 DIAGNOSIS — E039 Hypothyroidism, unspecified: Secondary | ICD-10-CM

## 2022-06-29 DIAGNOSIS — F332 Major depressive disorder, recurrent severe without psychotic features: Secondary | ICD-10-CM

## 2022-06-29 MED ORDER — LEVOTHYROXINE SODIUM 125 MCG PO TABS
125.0000 ug | ORAL_TABLET | Freq: Every day | ORAL | 1 refills | Status: DC
Start: 2022-06-29 — End: 2023-02-25

## 2022-06-29 MED ORDER — SERTRALINE HCL 100 MG PO TABS: 250.0000 mg | ORAL_TABLET | ORAL | 1 refills | Status: DC

## 2022-06-29 MED ORDER — HYDROXYZINE HCL 25 MG PO TABS
25.0000 mg | ORAL_TABLET | Freq: Every day | ORAL | 1 refills | Status: DC
Start: 2022-06-29 — End: 2022-12-29

## 2022-06-29 MED ORDER — GABAPENTIN 100 MG PO CAPS
100.0000 mg | ORAL_CAPSULE | Freq: Every day | ORAL | 1 refills | Status: DC
Start: 2022-06-29 — End: 2022-12-29

## 2022-06-29 NOTE — Progress Notes (Signed)
BP 124/73   Pulse 73   Temp 98.1 F (36.7 C)   LMP 05/20/2022 (Exact Date)   SpO2 98%    Subjective:    Patient ID: Sharon Gibson, female    DOB: 07-19-1981, 41 y.o.   MRN: [id number]  HPI: Sharon Gibson is a 41 y.o. female  Chief Complaint  Patient presents with  . Depression    Patient states she is currently in the middle of switching to [facility].   . Anxiety   ANXIETY/DEPRESSION- had issues with her psychiatry pushing weight loss medicine. She is moving to another psychiatrist at [facility].  Duration: Chronic Status:stable Anxious mood: yes  Excessive worrying: no Irritability: no  Sweating: no Nausea: no Palpitations:no Hyperventilation: no Panic attacks: no Agoraphobia: no  Obscessions/compulsions: no Depressed mood: yes    06/29/2022    3:20 PM 06/12/2022    2:27 PM 04/20/2022   11:31 AM 03/12/2022   11:30 AM 02/12/2022    9:12 AM  Depression screen PHQ 2/9  Decreased Interest 2 0 0 1 3  Down, Depressed, Hopeless 2 1 1 1 3   PHQ - 2 Score 4 1 1 2 6   Altered sleeping 1 2 3 3 3   Tired, decreased energy 2 2 3 3 3   Change in appetite 1 1 3 3 3   Feeling bad or failure about yourself  3 2 2 1 2   Trouble concentrating 1 0 2 2 3   Moving slowly or fidgety/restless 0 0 2 3 2   Suicidal thoughts 1 1 0 1 3  PHQ-9 Score 13 9 16 18 25   Difficult doing work/chores Somewhat difficult Not difficult at all Very difficult Very difficult Extremely dIfficult      06/29/2022    3:20 PM 06/12/2022    2:27 PM 04/20/2022   11:31 AM 02/12/2022   12:52 PM  GAD 7 : Generalized Anxiety Score  Nervous, Anxious, on Edge 3 3 3 3   Control/stop worrying 3 3 3 3   Worry too much - different things 3 3 3 3   Trouble relaxing 3 3 3 3   Restless 2 3 3 3   Easily annoyed or irritable 1 3 2 2   Afraid - awful might happen 2 3 3 3   Total GAD 7 Score 17 21 20 20   Anxiety Difficulty Somewhat difficult Extremely difficult Extremely difficult Extremely difficult   Anhedonia:  no Weight changes: no Insomnia: no   Hypersomnia: no Fatigue/loss of energy: yes Feelings of worthlessness: yes Feelings of guilt: no Impaired concentration/indecisiveness: no Suicidal ideations: no  Crying spells: no Recent Stressors/Life Changes: no   Relationship problems: no   Family stress: no     Financial stress: no    Job stress: yes    Recent death/loss: no  HYPOTHYROIDISM Thyroid control status:not taking medicine at this time Satisfied with current treatment? no Medication side effects: no Medication compliance: poor compliance Recent dose adjustment:no Fatigue: yes Cold intolerance: no Heat intolerance: no Weight gain: no Weight loss: no Constipation: no Diarrhea/loose stools: no Palpitations: no Lower extremity edema: no Anxiety/depressed mood: yes   Relevant past medical, surgical, family and social history reviewed and updated as indicated. Interim medical history since our last visit reviewed. Allergies and medications reviewed and updated.  Review of Systems  Constitutional: Negative.   Respiratory: Negative.    Cardiovascular: Negative.   Gastrointestinal: Negative.   Musculoskeletal: Negative.        Foot pain  Skin: Negative.   Neurological: Negative.   Psychiatric/Behavioral: Negative.  Per HPI unless specifically indicated above     Objective:    BP 124/73   Pulse 73   Temp 98.1 F (36.7 C)   LMP 05/20/2022 (Exact Date)   SpO2 98%   Wt Readings from Last 3 Encounters:  06/03/22 271 lb 2.7 oz (123 kg)  05/19/22 271 lb 11.2 oz (123.2 kg)  04/20/22 269 lb 6.4 oz (122.2 kg)    Physical Exam Vitals and nursing note reviewed.  Constitutional:      General: She is not in acute distress.    Appearance: Normal appearance. She is not ill-appearing, toxic-appearing or diaphoretic.  HENT:     Head: Normocephalic and atraumatic.     Right Ear: External ear normal.     Left Ear: External ear normal.     Nose: Nose normal.      Mouth/Throat:     Mouth: Mucous membranes are moist.     Pharynx: Oropharynx is clear.  Eyes:     General: No scleral icterus.       Right eye: No discharge.        Left eye: No discharge.     Extraocular Movements: Extraocular movements intact.     Conjunctiva/sclera: Conjunctivae normal.     Pupils: Pupils are equal, round, and reactive to light.  Cardiovascular:     Rate and Rhythm: Normal rate and regular rhythm.     Pulses: Normal pulses.     Heart sounds: Normal heart sounds. No murmur heard.    No friction rub. No gallop.  Pulmonary:     Effort: Pulmonary effort is normal. No respiratory distress.     Breath sounds: Normal breath sounds. No stridor. No wheezing, rhonchi or rales.  Chest:     Chest wall: No tenderness.  Musculoskeletal:        General: Normal range of motion.     Cervical back: Normal range of motion and neck supple.  Skin:    General: Skin is warm and dry.     Capillary Refill: Capillary refill takes less than 2 seconds.     Coloration: Skin is not jaundiced or pale.     Findings: No bruising, erythema, lesion or rash.  Neurological:     General: No focal deficit present.     Mental Status: She is alert and oriented to person, place, and time. Mental status is at baseline.  Psychiatric:        Mood and Affect: Mood normal.        Behavior: Behavior normal.        Thought Content: Thought content normal.        Judgment: Judgment normal.    Results for orders placed or performed during the hospital encounter of 06/03/22  Uric acid  Result Value Ref Range   Uric Acid, Serum 4.8 2.5 - 7.1 mg/dL  CBC  Result Value Ref Range   WBC 6.5 4.0 - 10.5 K/uL   RBC 4.39 3.87 - 5.11 MIL/uL   Hemoglobin 12.3 12.0 - 15.0 g/dL   HCT 40.9 73.5 - 32.9 %   MCV 87.7 80.0 - 100.0 fL   MCH 28.0 26.0 - 34.0 pg   MCHC 31.9 30.0 - 36.0 g/dL   RDW 92.4 26.8 - 34.1 %   Platelets 283 150 - 400 K/uL   nRBC 0.0 0.0 - 0.2 %  Sedimentation rate  Result Value Ref Range    Sed Rate 8 0 - 20 mm/hr      Assessment &  Plan:   Problem List Items Addressed This Visit       Endocrine   Hypothyroidism    Has not been taking her meds. Will restart and return in 6 weeks for labs. Await results. Treat as needed.       Relevant Medications   levothyroxine (SYNTHROID) 125 MCG tablet   Other Relevant Orders   TSH     Other   PTSD (post-traumatic stress disorder)    Will refill her medicines at this time. To see psychiatry shortly. Continue to monitor. Call with any concerns.       Relevant Medications   sertraline (ZOLOFT) 100 MG tablet   hydrOXYzine (ATARAX) 25 MG tablet   Severe episode of recurrent major depressive disorder, without psychotic features ([place]) - Primary    Will refill her medicines at this time. To see psychiatry shortly. Continue to monitor. Call with any concerns.       Relevant Medications   sertraline (ZOLOFT) 100 MG tablet   hydrOXYzine (ATARAX) 25 MG tablet   Generalized anxiety disorder    Will refill her medicines at this time. To see psychiatry shortly. Continue to monitor. Call with any concerns.       Relevant Medications   sertraline (ZOLOFT) 100 MG tablet   hydrOXYzine (ATARAX) 25 MG tablet   Right foot pain    Continue to follow with podiatry. Continue to wear shoes as appropriate. Continue to monitor.         Follow up plan: Return in about 6 months (around 12/29/2022) for physical.

## 2022-06-29 NOTE — Assessment & Plan Note (Addendum)
Has not been taking her meds. Will restart and return in 6 weeks for labs. Await results. Treat as needed.

## 2022-06-29 NOTE — Assessment & Plan Note (Signed)
Will refill her medicines at this time. To see psychiatry shortly. Continue to monitor. Call with any concerns.

## 2022-06-29 NOTE — Assessment & Plan Note (Signed)
Continue to follow with podiatry. Continue to wear shoes as appropriate. Continue to monitor.

## 2022-06-29 NOTE — Assessment & Plan Note (Signed)
Will refill her medicines at this time. To see psychiatry shortly. Continue to monitor. Call with any concerns.  

## 2022-07-14 ENCOUNTER — Ambulatory Visit: Payer: BC Managed Care – PPO | Admitting: Physician Assistant

## 2022-07-14 ENCOUNTER — Encounter: Payer: Self-pay | Admitting: Physician Assistant

## 2022-07-14 VITALS — BP 107/76 | HR 68 | Temp 99.0°F

## 2022-07-14 DIAGNOSIS — R52 Pain, unspecified: Secondary | ICD-10-CM | POA: Diagnosis not present

## 2022-07-14 DIAGNOSIS — J029 Acute pharyngitis, unspecified: Secondary | ICD-10-CM | POA: Diagnosis not present

## 2022-07-14 NOTE — Patient Instructions (Signed)
  I recommend trying to use Flonase to help with your ear pain and nasal drainage You can also add Claritin, Zyrtec, allegra, etc daily to help further   We will keep you updated with the results once they are available

## 2022-07-14 NOTE — Progress Notes (Unsigned)
Acute Office Visit   Patient: Sharon Gibson   DOB: 09/25/1981   41 y.o. Female  MRN: [id number] Visit Date: 07/14/2022  Today's healthcare provider: Dani Gobble Buna Cuppett, PA-C  Introduced myself to the patient as a Journalist, newspaper and provided education on APPs in clinical practice.    Chief Complaint  Patient presents with   Ear Pain    B/L ears feel plugges, L >R   Sinus pressure    Started about a 1 1/2 half week ago   Subjective    HPI HPI     Ear Pain    Additional comments: B/L ears feel plugges, L >R        Sinus pressure    Additional comments: Started about a 1 1/2 half week ago      Last edited by Jerelene Redden, CMA on 07/14/2022  3:16 PM.       Sinus pain and pressure Reports about a week or so she noted her ears felt clogged and like they "were stuffed with cotton"  She reports over the last few days she has felt more run down and tired She has taken home COVID tests - both were negative  She denies sick contacts at home but she may have had some at work Interventions: Nothing yet    Medications: Outpatient Medications Prior to Visit  Medication Sig   EPINEPHrine 0.3 mg/0.3 mL IJ SOAJ injection Inject 0.3 mg into the muscle as needed for anaphylaxis.   gabapentin (NEURONTIN) 100 MG capsule Take 1 capsule (100 mg total) by mouth at bedtime.   hydrOXYzine (ATARAX) 25 MG tablet Take 1 tablet (25 mg total) by mouth daily. At night   levothyroxine (SYNTHROID) 125 MCG tablet Take 1 tablet (125 mcg total) by mouth daily.   sertraline (ZOLOFT) 100 MG tablet Take 2.5 tablets (250 mg total) by mouth as directed.   No facility-administered medications prior to visit.    Review of Systems  Constitutional:  Positive for chills and fatigue.  HENT:  Positive for ear pain, postnasal drip, sinus pressure (frontal sinus pressure) and sore throat. Negative for congestion, sinus pain and trouble swallowing.   Respiratory:  Negative for cough, chest tightness,  shortness of breath and wheezing.   Gastrointestinal:  Negative for diarrhea, nausea and vomiting.  Musculoskeletal:  Positive for myalgias.  Neurological:  Positive for headaches. Negative for dizziness.    {Labs  Heme  Chem  Endocrine  Serology  Results Review (optional):23779}   Objective    BP 107/76   Pulse 68   Temp 99 F (37.2 C) (Oral)   SpO2 99%  {Show previous vital signs (optional):23777}  Physical Exam Vitals reviewed.  Constitutional:      General: She is awake.     Appearance: Normal appearance. She is well-developed and well-groomed.  HENT:     Head: Normocephalic and atraumatic.     Right Ear: Tympanic membrane, ear canal and external ear normal.     Left Ear: Tympanic membrane, ear canal and external ear normal.     Mouth/Throat:     Lips: Pink.     Mouth: No oral lesions.     Dentition: No gingival swelling, dental abscesses or gum lesions.     Tongue: No lesions.     Palate: Mass present.     Pharynx: Uvula midline. No oropharyngeal exudate, posterior oropharyngeal erythema or uvula swelling.  Cardiovascular:     Rate and Rhythm: Normal  rate and regular rhythm.     Heart sounds: Normal heart sounds. No murmur heard.    No friction rub. No gallop.  Pulmonary:     Effort: Pulmonary effort is normal.     Breath sounds: Normal breath sounds. No decreased air movement. No decreased breath sounds, wheezing, rhonchi or rales.  Musculoskeletal:     Cervical back: Normal range of motion.  Lymphadenopathy:     Head:     Right side of head: No submental, submandibular or preauricular adenopathy.     Left side of head: No submental, submandibular or preauricular adenopathy.     Cervical:     Right cervical: No posterior cervical adenopathy.    Left cervical: No posterior cervical adenopathy.     Upper Body:     Right upper body: No supraclavicular adenopathy.     Left upper body: No supraclavicular adenopathy.  Neurological:     Mental Status: She is  alert.  Psychiatric:        Behavior: Behavior is cooperative.       No results found for any visits on 07/14/22.  Assessment & Plan      No follow-ups on file.

## 2022-07-15 ENCOUNTER — Telehealth: Payer: Self-pay | Admitting: *Deleted

## 2022-07-15 NOTE — Telephone Encounter (Signed)
Results and provider comments given to pt. Questions answered, verbalized understanding.   Erin E Mecum, PA-C  07/14/2022  4:58 PM EDT     Rapid strep was negative. We will send this off for culture for a definitive rule out  Negative for Flu A and B as well.  Please proceed with the management plan discussed during your apt.    Pt still waiting on culture results.

## 2022-07-17 ENCOUNTER — Encounter: Payer: Self-pay | Admitting: Physician Assistant

## 2022-07-17 LAB — VERITOR FLU A/B WAIVED
Influenza A: NEGATIVE
Influenza B: NEGATIVE

## 2022-07-17 LAB — CULTURE, GROUP A STREP: Strep A Culture: NEGATIVE

## 2022-07-17 LAB — RAPID STREP SCREEN (MED CTR MEBANE ONLY): Strep Gp A Ag, IA W/Reflex: NEGATIVE

## 2022-09-13 ENCOUNTER — Emergency Department
Admission: EM | Admit: 2022-09-13 | Discharge: 2022-09-13 | Disposition: A | Discharge status: Home / Self Care | Payer: BC Managed Care – PPO | Attending: Emergency Medicine | Admitting: Emergency Medicine

## 2022-09-13 ENCOUNTER — Emergency Department: Payer: BC Managed Care – PPO

## 2022-09-13 ENCOUNTER — Other Ambulatory Visit: Payer: Self-pay

## 2022-09-13 ENCOUNTER — Telehealth: Payer: BC Managed Care – PPO | Admitting: Family

## 2022-09-13 ENCOUNTER — Ambulatory Visit: Admit: 2022-09-13 | Payer: BC Managed Care – PPO

## 2022-09-13 DIAGNOSIS — R42 Dizziness and giddiness: Secondary | ICD-10-CM | POA: Insufficient documentation

## 2022-09-13 DIAGNOSIS — I10 Essential (primary) hypertension: Secondary | ICD-10-CM

## 2022-09-13 DIAGNOSIS — R197 Diarrhea, unspecified: Secondary | ICD-10-CM

## 2022-09-13 LAB — CBC
HCT: 43.1 % (ref 36.0–46.0)
Hemoglobin: 13.6 g/dL (ref 12.0–15.0)
MCH: 27.8 pg (ref 26.0–34.0)
MCHC: 31.6 g/dL (ref 30.0–36.0)
MCV: 88.1 fL (ref 80.0–100.0)
Platelets: 301 10*3/uL (ref 150–400)
RBC: 4.89 MIL/uL (ref 3.87–5.11)
RDW: 13.2 % (ref 11.5–15.5)
WBC: 7.7 10*3/uL (ref 4.0–10.5)
nRBC: 0 % (ref 0.0–0.2)

## 2022-09-13 LAB — BASIC METABOLIC PANEL
Anion gap: 7 (ref 5–15)
BUN: 20 mg/dL (ref 6–20)
CO2: 23 mmol/L (ref 22–32)
Calcium: 9.1 mg/dL (ref 8.9–10.3)
Chloride: 108 mmol/L (ref 98–111)
Creatinine, Ser: 0.67 mg/dL (ref 0.44–1.00)
GFR, Estimated: >60 mL/min (ref >60)
Glucose, Bld: 93 mg/dL (ref 70–99)
Potassium: 4 mmol/L (ref 3.5–5.1)
Sodium: 138 mmol/L (ref 135–145)

## 2022-09-13 LAB — CBG MONITORING, ED: Glucose-Capillary: 84 mg/dL (ref 70–99)

## 2022-09-13 MED ORDER — MECLIZINE HCL 25 MG PO TABS: 25.0000 mg | ORAL_TABLET | Freq: Three times a day (TID) | ORAL | 0 refills | Status: DC | PRN

## 2022-09-13 MED ORDER — DIAZEPAM 2 MG PO TABS
2.0000 mg | ORAL_TABLET | Freq: Once | ORAL | Status: AC
  Administered 2022-09-13: 2 mg via ORAL
  Filled 2022-09-13: qty 1

## 2022-09-13 MED ORDER — DIAZEPAM 2 MG PO TABS: ORAL_TABLET | ORAL | 0 refills | Status: DC

## 2022-09-13 MED ORDER — MECLIZINE HCL 25 MG PO TABS
25.0000 mg | ORAL_TABLET | Freq: Once | ORAL | Status: AC
  Administered 2022-09-13: 25 mg via ORAL
  Filled 2022-09-13: qty 1

## 2022-09-13 NOTE — Progress Notes (Signed)
Because of all you symptoms of dizziness and hypertension, I feel your condition warrants further evaluation and I recommend that you be seen in a face to face visit.   NOTE: There will be NO CHARGE for this eVisit   If you are having a true medical emergency please call 911.      For an urgent face to face visit, [facility] has seven urgent care centers for your convenience:     [facility] at [facility] Directions [phone] [address] [place], [place] [address]    [facility]) Get Driving Directions [phone] [address] [place], [place] [address]  [facility] Health Urgent Care Center [facility]) Get Driving Directions [phone] [address] [place],  [place]  [address]  [facility] Health Urgent Care Center [facility] - at [facility] Directions  [phone] [address] [place],  [place] [address]   [facility] Urgent Care at [facility] Get Driving Directions [phone] [address] [place], [place] [address]   [facility] Health Urgent Care at [facility] Get Driving Directions  [phone] [address] [place], [place] [address]   [facility] Health Urgent Care at [facility] Directions [phone] [address] [place], [place] [address]  Your MyChart E-visit questionnaire answers were reviewed by a board certified advanced clinical practitioner to complete your personal care plan based on your specific symptoms.  Thank you for using e-Visits.

## 2022-09-13 NOTE — Discharge Instructions (Addendum)
Take the Antivert 1 pill 3 times a day and the Valium 1 pill 2 or 3 times a day as needed for the dizzy sensation.  I would continue to use it for 2 or 3 days after the dizziness resolves.  Please follow-up with ENT.  Dr. Azucena Fallen is on-call.  Give his office a call in the morning let him know that you had some vertigo and they should be able to schedule you with a follow-up appointment in about a week or so.  Please return here if you are worse.

## 2022-09-13 NOTE — ED Provider Notes (Signed)
[facility] Provider Note    Event Date/Time   First MD Initiated Contact with Patient 09/13/22 1511     (approximate)   History   Dizziness   HPI  Sharon Gibson is a 41 y.o. female who reports last night she had an episode where her blood pressure which usually runs about 110 went up to 174 and came down slowly over the course the night.  She additionally she has been feeling dizzy.  Movement makes her feel like everything is moving around her and she has to hold onto the bed.  This is continued now.  Moving in the bed still makes her dizzy.  I do not see any nystagmus however.      Physical Exam   Triage Vital Signs: ED Triage Vitals [09/13/22 1404]  Enc Vitals Group     BP 115/77     Pulse Rate 68     Resp 18     Temp 98.3 F (36.8 C)     Temp Source Oral     SpO2 98 %     Weight 275 lb (124.7 kg)     Height 5\' 3"  (1.6 m)     Head Circumference      Peak Flow      Pain Score 0     Pain Loc      Pain Edu?      Excl. in [place]?     Most recent vital signs: Vitals:   09/13/22 1404  BP: 115/77  Pulse: 68  Resp: 18  Temp: 98.3 F (36.8 C)  SpO2: 98%     General: Awake, no distress.  Head normocephalic atraumatic Eyes pupils equal round reactive extraocular movements intact I do not see any nystagmus Ears: TMs are clear Mouth no erythema or exudate CV:  Good peripheral perfusion.  Heart regular rate and rhythm no audible murmurs Resp:  Normal effort.  Lungs are clear Abd:  No distention.  Soft and nontender Extremities: No edema Neuro exam cranial nerves II through XII are intact although visual fields were not checked.  Cerebellar finger-nose heel-to-shin and rapid alternating movements and hands are normal.  Motor strength is 5/5 throughout patient does not report any numbness.  Thrust testing seems this shows slight deviation of the eyes with returning to gaze.  I am unable to show any nystagmus on exam.   ED Results /  Procedures / Treatments   Labs (all labs ordered are listed, but only abnormal results are displayed) Labs Reviewed  BASIC METABOLIC PANEL  CBC  URINALYSIS, ROUTINE W REFLEX MICROSCOPIC  CBG MONITORING, ED  POC URINE PREG, ED     EKG  EKG read and interpreted by me shows sinus bradycardia rate of 59 normal axis no acute disease   RADIOLOGY MRI returned showing only the known arachnoid cyst.  No sign of any stroke or other problem that could cause vertigo.   PROCEDURES:  Critical Care performed:   Procedures   MEDICATIONS ORDERED IN ED: Medications  meclizine (ANTIVERT) tablet 25 mg (25 mg Oral Given 09/13/22 1607)  diazepam (VALIUM) tablet 2 mg (2 mg Oral Given 09/13/22 1607)     IMPRESSION / MDM / ASSESSMENT AND PLAN / ED COURSE  I reviewed the triage vital signs and the nursing notes. Patient's MRI is negative.  Patient has no other findings on neuroexam.  I believe she is having peripheral vertigo.  I will have her follow-up with ENT.  She will return  if she is worse.  Will give her some Valium 2 mg by mouth 2 or 3 times a day and some Antivert 25 mg 3 times a day.  Differential diagnosis includes, but is not limited to, benign positional vertigo, other causes of vertigo or central vertigo which is less likely.  Patient's presentation is most consistent with acute presentation with potential threat to life or bodily function.     FINAL CLINICAL IMPRESSION(S) / ED DIAGNOSES   Final diagnoses:  Vertigo     Rx / DC Orders   ED Discharge Orders          Ordered    meclizine (ANTIVERT) 25 MG tablet  3 times daily PRN        09/13/22 1820    diazepam (VALIUM) 2 MG tablet        09/13/22 1820             Note:  This document was prepared using Dragon voice recognition software and may include unintentional dictation errors.   Nena Polio, MD 09/13/22 [phone]

## 2022-09-13 NOTE — ED Triage Notes (Signed)
PT states coming in with dizziness and high blood pressure that started last night. Pt states she has lost her balance, but not fallen. Pt denies numbness, blurred vision or double vision.

## 2022-09-16 ENCOUNTER — Telehealth: Payer: Self-pay

## 2022-09-16 NOTE — Telephone Encounter (Signed)
Transition Care Management Unsuccessful Follow-up Telephone Call  Date of discharge and from where:  [facility] ER, 09/13/22  Attempts:  1st Attempt  Reason for unsuccessful TCM follow-up call:  Left voice message

## 2022-09-18 NOTE — Telephone Encounter (Signed)
Transition Care Management Unsuccessful Follow-up Telephone Call  Date of discharge and from where:  [facility] ER, 09/13/22  Attempts:  2nd Attempt  Reason for unsuccessful TCM follow-up call:  No answer/busy

## 2022-10-25 ENCOUNTER — Other Ambulatory Visit: Payer: Self-pay

## 2022-10-25 ENCOUNTER — Emergency Department
Admission: EM | Admit: 2022-10-25 | Discharge: 2022-10-25 | Disposition: A | Discharge status: Home / Self Care | Payer: BC Managed Care – PPO | Attending: Emergency Medicine | Admitting: Emergency Medicine

## 2022-10-25 DIAGNOSIS — U071 COVID-19: Secondary | ICD-10-CM | POA: Diagnosis not present

## 2022-10-25 DIAGNOSIS — R059 Cough, unspecified: Secondary | ICD-10-CM | POA: Diagnosis present

## 2022-10-25 LAB — RESP PANEL BY RT-PCR (RSV, FLU A&B, COVID)  RVPGX2
Influenza A by PCR: NEGATIVE
Influenza B by PCR: NEGATIVE
Resp Syncytial Virus by PCR: NEGATIVE
SARS Coronavirus 2 by RT PCR: POSITIVE — AB

## 2022-10-25 MED ORDER — PSEUDOEPH-BROMPHEN-DM 30-2-10 MG/5ML PO SYRP: 10.0000 mL | ORAL_SOLUTION | Freq: Four times a day (QID) | ORAL | 0 refills | Status: DC | PRN

## 2022-10-25 MED ORDER — ONDANSETRON 4 MG PO TBDP: 4.0000 mg | ORAL_TABLET | Freq: Three times a day (TID) | ORAL | 0 refills | Status: DC | PRN

## 2022-10-25 MED ORDER — LOPERAMIDE HCL 2 MG PO TABS: 2.0000 mg | ORAL_TABLET | Freq: Four times a day (QID) | ORAL | 0 refills | Status: DC | PRN

## 2022-10-25 MED ORDER — BENZONATATE 100 MG PO CAPS: 100.0000 mg | ORAL_CAPSULE | Freq: Three times a day (TID) | ORAL | 0 refills | Status: DC | PRN

## 2022-10-25 NOTE — ED Notes (Signed)
Pt asked coul.d she still be tested for flu/RSV as well.

## 2022-10-25 NOTE — ED Notes (Signed)
Discharge instructions explained to patient and family at this time. Patient and family state they understand and agree.   

## 2022-10-25 NOTE — ED Triage Notes (Signed)
Pt to ED for COVID+. She tested at home tonight with two tests and they were both positive. She called the nurse hotline and they told her to come to the ER for medications for COVID. Pt is CAOx4 and in no acute distress and ambulatory in triage.

## 2022-10-25 NOTE — ED Provider Notes (Signed)
[facility] Provider Note  Patient Contact: 10:19 PM (approximate)   History   COVID +    HPI  Sharon Gibson is a 42 y.o. female who presents emergency department complaining of positive COVID test.  Patient states that she lives with her parents who are elderly and her father is at higher risk.  She called nurse line to see what she can do to help mitigate the risk for her family and was told to present to the emergency department for evaluation.  Patient has bodyaches, congestion, cough, sore throat starting yesterday.  At home positive tests.  No chest pain, shortness of breath, GI complaints currently.     Physical Exam   Triage Vital Signs: ED Triage Vitals [10/25/22 2055]  Enc Vitals Group     BP 117/62     Pulse Rate 80     Resp 16     Temp 98.2 F (36.8 C)     Temp Source Oral     SpO2 98 %     Weight 300 lb (136.1 kg)     Height 5\' 3"  (1.6 m)     Head Circumference      Peak Flow      Pain Score 0     Pain Loc      Pain Edu?      Excl. in [place]?     Most recent vital signs: Vitals:   10/25/22 2055  BP: 117/62  Pulse: 80  Resp: 16  Temp: 98.2 F (36.8 C)  SpO2: 98%     General: Alert and in no acute distress. ENT:      Ears:       Nose: No congestion/rhinnorhea.      Mouth/Throat: Mucous membranes are moist. Neck: No stridor.   Cardiovascular:  Good peripheral perfusion Respiratory: Normal respiratory effort without tachypnea or retractions. Lungs CTAB. Good air entry to the bases with no decreased or absent breath sounds Musculoskeletal: Full range of motion to all extremities.  Neurologic:  No gross focal neurologic deficits are appreciated.  Skin:   No rash noted Other:   ED Results / Procedures / Treatments   Labs (all labs ordered are listed, but only abnormal results are displayed) Labs Reviewed  RESP PANEL BY RT-PCR (RSV, FLU A&B, COVID)  [id number] - Abnormal; Notable for the following components:       Result Value   SARS Coronavirus 2 by RT PCR POSITIVE (*)    All other components within normal limits     EKG     RADIOLOGY    No results found.  PROCEDURES:  Critical Care performed: No  Procedures   MEDICATIONS ORDERED IN ED: Medications - No data to display   IMPRESSION / MDM / [place] / ED COURSE  I reviewed the triage vital signs and the nursing notes.                                 Differential diagnosis includes, but is not limited to, COVID, flu, RSV, viral illness   Patient's presentation is most consistent with acute presentation with potential threat to life or bodily function.   Patient's diagnosis is consistent with COVID.  Patient presents emergency department with positive home COVID test.  Patient is concerned that as her father has multiple comorbidities and wanted to do everything she could to prevent spread.  She called the nurse  line for recommendations and was advised to present to the emergency department.  Patient has cold-like symptoms with a positive test here in the emergency department.  At this time we will prescribe symptom control medications at home.  We discussed antiviral medications, however with side effect profile, as well as the minor nature of symptoms, I do not currently recommend antivirals.  Tylenol, Motrin, plenty of fluids and rest.  Concerning signs and symptoms and return precautions discussed with the patient.  Follow-up primary care as needed.. Patient is given ED precautions to return to the ED for any worsening or new symptoms.     FINAL CLINICAL IMPRESSION(S) / ED DIAGNOSES   Final diagnoses:  [id number]     Rx / DC Orders   ED Discharge Orders          Ordered    benzonatate (TESSALON PERLES) 100 MG capsule  3 times daily PRN        10/25/22 2222    brompheniramine-pseudoephedrine-DM 30-2-10 MG/5ML syrup  4 times daily PRN        10/25/22 2222    ondansetron (ZOFRAN-ODT) 4 MG disintegrating  tablet  Every 8 hours PRN        10/25/22 2222    loperamide (IMODIUM A-D) 2 MG tablet  4 times daily PRN        10/25/22 2222             Note:  This document was prepared using Dragon voice recognition software and may include unintentional dictation errors.   Brynda Peon 10/25/22 2223    Rada Hay, MD 10/26/22 [phone]

## 2022-11-04 ENCOUNTER — Encounter: Payer: Self-pay | Admitting: Nurse Practitioner

## 2022-11-04 ENCOUNTER — Ambulatory Visit: Payer: Self-pay | Admitting: *Deleted

## 2022-11-04 ENCOUNTER — Ambulatory Visit (INDEPENDENT_AMBULATORY_CARE_PROVIDER_SITE_OTHER): Payer: BC Managed Care – PPO | Admitting: Nurse Practitioner

## 2022-11-04 ENCOUNTER — Ambulatory Visit
Admission: RE | Admit: 2022-11-04 | Discharge: 2022-11-04 | Disposition: A | Discharge status: Home / Self Care | Payer: BC Managed Care – PPO | Attending: Nurse Practitioner | Admitting: Nurse Practitioner

## 2022-11-04 ENCOUNTER — Ambulatory Visit
Admission: RE | Admit: 2022-11-04 | Discharge: 2022-11-04 | Disposition: A | Discharge status: Home / Self Care | Payer: BC Managed Care – PPO | Source: Ambulatory Visit | Attending: Nurse Practitioner | Admitting: Nurse Practitioner

## 2022-11-04 VITALS — HR 75 | Temp 98.2°F

## 2022-11-04 DIAGNOSIS — R0602 Shortness of breath: Secondary | ICD-10-CM

## 2022-11-04 MED ORDER — METHYLPREDNISOLONE 4 MG PO TBPK: ORAL_TABLET | ORAL | 0 refills | Status: DC

## 2022-11-04 NOTE — Telephone Encounter (Addendum)
  Chief Complaint: Shortness of breath.   Had Covid 2 wks ago.  Symptoms have resolved but has developed this shortness of breath Symptoms: feels like a restriction in her chest with deep breaths Frequency: A week now Pertinent Negatives: Patient denies other symptoms other than a runny nose Disposition: [] ED /[] Urgent Care (no appt availability in office) / [x] Appointment(In office/virtual)/ []  [facility]/ [] Home Care/ [] Refused Recommended Disposition /[] [facility]/ []  Follow-up with PCP Additional Notes: I'm sending a high priority message to [facility] to see if she can be worked in today. I gave the pt the temporary address at [place]. In [place],  [address]     Sent a Teams message but did not get a response and pt. Was on hold so sent practice this message.

## 2022-11-04 NOTE — Telephone Encounter (Signed)
Called patient to ask her if she can come in at 1:40 to see Jon Billings, NP since Dr. Wynetta Emery doesn't have any availability. If patient would like this time slot please send me a teams

## 2022-11-04 NOTE — Telephone Encounter (Signed)
Reason for Disposition  [1] MILD difficulty breathing (e.g., minimal/no SOB at rest, SOB with walking, pulse <100) AND [2] NEW-onset or WORSE than normal  Answer Assessment - Initial Assessment Questions 1. RESPIRATORY STATUS: "Describe your breathing?" (e.g., wheezing, shortness of breath, unable to speak, severe coughing)      Having shortness of breath this morning.   Harder to take in a deep breath.   Feels constricted in my chest. I had Covid 2 weeks ago.   I went to ED and it was confirmed as Covid.   Still have runny nose but other symptoms have cleared up. I didn't have the shortness of breath during Covid.   Not coughing very much.   2. ONSET: "When did this breathing problem begin?"      Began maybe Friday     3. PATTERN "Does the difficult breathing come and go, or has it been constant since it started?"      Short of breath quickly when talking. 4. SEVERITY: "How bad is your breathing?" (e.g., mild, moderate, severe)    - MILD: No SOB at rest, mild SOB with walking, speaks normally in sentences, can lie down, no retractions, pulse < 100.    - MODERATE: SOB at rest, SOB with minimal exertion and prefers to sit, cannot lie down flat, speaks in phrases, mild retractions, audible wheezing, pulse 100-120.    - SEVERE: Very SOB at rest, speaks in single words, struggling to breathe, sitting hunched forward, retractions, pulse > 120      Moderate 5. RECURRENT SYMPTOM: "Have you had difficulty breathing before?" If Yes, ask: "When was the last time?" and "What happened that time?"      No 6. CARDIAC HISTORY: "Do you have any history of heart disease?" (e.g., heart attack, angina, bypass surgery, angioplasty)      Not asked 7. LUNG HISTORY: "Do you have any history of lung disease?"  (e.g., pulmonary embolus, asthma, emphysema)     Not history 8. CAUSE: "What do you think is causing the breathing problem?"      I don't know 9. OTHER SYMPTOMS: "Do you have any other symptoms? (e.g.,  dizziness, runny nose, cough, chest pain, fever)     Runny nose only.     10. O2 SATURATION MONITOR:  "Do you use an oxygen saturation monitor (pulse oximeter) at home?" If Yes, ask: "What is your reading (oxygen level) today?" "What is your usual oxygen saturation reading?" (e.g., 95%)       Not asked 11. PREGNANCY: "Is there any chance you are pregnant?" "When was your last menstrual period?"       Not asked 12. TRAVEL: "Have you traveled out of the country in the last month?" (e.g., travel history, exposures)       Not asked  Protocols used: Breathing Difficulty-A-AH

## 2022-11-04 NOTE — Progress Notes (Signed)
Pulse 75   Temp 98.2 F (36.8 C) (Oral)   SpO2 98%    Subjective:    Patient ID: Sharon Gibson, female    DOB: 1981-07-12, 42 y.o.   MRN: [id number]  HPI: Sharon Gibson is a 42 y.o. female  Chief Complaint  Patient presents with   Shortness of Breath    Pt states she tested positive for covid last Sunday, about 10 days ago. States this past Friday she started having worsening SOB. States she feels tight around her ribcage and is finding herself having to take deep breaths in the middle of sentences when she is speaking.    SHORTNESS OF BREATH Patient states she has been having conversations and feels like she can't take a full breath.  This has worsened today.  She was diagnosed with COVID 10 days ago.  She feels a pain and tightness around where her bra is.  Doesn't really feel like something is sitting on her chest.  Duration: today Onset: gradual Description of breathing discomfort:  Severity: moderate Related to exertion: yes Cough: yes Chest tightness: yes Wheezing: no Fevers: no Chest pain: yes Palpitations: no  Nausea: no Diaphoresis: no Deconditioning: no Status: worse  Relevant past medical, surgical, family and social history reviewed and updated as indicated. Interim medical history since our last visit reviewed. Allergies and medications reviewed and updated.  Review of Systems  Constitutional:  Negative for diaphoresis.  Respiratory:  Positive for cough, chest tightness and shortness of breath. Negative for wheezing.   Cardiovascular:  Positive for chest pain. Negative for palpitations.  Gastrointestinal:  Negative for nausea.    Per HPI unless specifically indicated above     Objective:    Pulse 75   Temp 98.2 F (36.8 C) (Oral)   SpO2 98%   Wt Readings from Last 3 Encounters:  10/25/22 300 lb (136.1 kg)  09/13/22 275 lb (124.7 kg)  06/03/22 271 lb 2.7 oz (123 kg)    Physical Exam Vitals and nursing note reviewed.  Constitutional:       General: She is not in acute distress.    Appearance: Normal appearance. She is normal weight. She is not ill-appearing, toxic-appearing or diaphoretic.  HENT:     Head: Normocephalic.     Right Ear: External ear normal.     Left Ear: External ear normal.     Nose: Nose normal.     Mouth/Throat:     Mouth: Mucous membranes are moist.     Pharynx: Oropharynx is clear.  Eyes:     General:        Right eye: No discharge.        Left eye: No discharge.     Extraocular Movements: Extraocular movements intact.     Conjunctiva/sclera: Conjunctivae normal.     Pupils: Pupils are equal, round, and reactive to light.  Cardiovascular:     Rate and Rhythm: Normal rate and regular rhythm.     Heart sounds: No murmur heard. Pulmonary:     Effort: Pulmonary effort is normal. No respiratory distress.     Breath sounds: Normal breath sounds. No wheezing or rales.  Musculoskeletal:     Cervical back: Normal range of motion and neck supple.  Skin:    General: Skin is warm and dry.     Capillary Refill: Capillary refill takes less than 2 seconds.  Neurological:     General: No focal deficit present.     Mental Status: She is alert and  oriented to person, place, and time. Mental status is at baseline.  Psychiatric:        Mood and Affect: Mood normal.        Behavior: Behavior normal.        Thought Content: Thought content normal.        Judgment: Judgment normal.     Results for orders placed or performed during the hospital encounter of 10/25/22  Resp panel by RT-PCR (RSV, Flu A&B, Covid) Anterior Nasal Swab   Specimen: Anterior Nasal Swab  Result Value Ref Range   SARS Coronavirus 2 by RT PCR POSITIVE (A) NEGATIVE   Influenza A by PCR NEGATIVE NEGATIVE   Influenza B by PCR NEGATIVE NEGATIVE   Resp Syncytial Virus by PCR NEGATIVE NEGATIVE      Assessment & Plan:   Problem List Items Addressed This Visit   None Visit Diagnoses     Shortness of breath    -  Primary   EKG  showed NSR.  Will obtain chest xray.  Will make recommendations based on imaging results.  Will treat with medrol dose pak.   Relevant Orders   EKG 12-Lead (Completed)   DG Chest 2 View        Follow up plan: Return if symptoms worsen or fail to improve.

## 2022-11-04 NOTE — Telephone Encounter (Signed)
I cannot see her today- but I'm not sure if someone else can. I can see her tomorrow though.

## 2022-11-04 NOTE — Telephone Encounter (Signed)
Pt scheduled today at 1:40

## 2022-11-05 NOTE — Progress Notes (Signed)
Results discussed with patient during visit.

## 2022-11-06 ENCOUNTER — Encounter: Payer: Self-pay | Admitting: Nurse Practitioner

## 2022-11-06 NOTE — Progress Notes (Signed)
Hi Sharon Gibson.  Your chest xray came back normal.  No pneumonia present.

## 2022-11-19 ENCOUNTER — Inpatient Hospital Stay: Payer: BC Managed Care – PPO | Attending: Oncology

## 2022-11-20 ENCOUNTER — Encounter: Payer: Self-pay | Admitting: Family Medicine

## 2022-11-20 ENCOUNTER — Ambulatory Visit: Payer: BC Managed Care – PPO | Admitting: Family Medicine

## 2022-11-20 VITALS — BP 88/57 | HR 67 | Temp 98.8°F

## 2022-11-20 DIAGNOSIS — J0100 Acute maxillary sinusitis, unspecified: Secondary | ICD-10-CM | POA: Diagnosis not present

## 2022-11-20 DIAGNOSIS — J029 Acute pharyngitis, unspecified: Secondary | ICD-10-CM

## 2022-11-20 MED ORDER — DOXYCYCLINE HYCLATE 100 MG PO TABS: 100.0000 mg | ORAL_TABLET | Freq: Two times a day (BID) | ORAL | 0 refills | Status: DC

## 2022-11-20 MED ORDER — PREDNISONE 50 MG PO TABS: 50.0000 mg | ORAL_TABLET | Freq: Every day | ORAL | 0 refills | Status: DC

## 2022-11-20 MED FILL — Iron Sucrose Inj 20 MG/ML (Fe Equiv): INTRAVENOUS | Qty: 10 | Status: AC

## 2022-11-20 NOTE — Progress Notes (Signed)
BP (!) 88/57   Pulse 67   Temp 98.8 F (37.1 C) (Oral)   LMP 11/03/2022   SpO2 98%    Subjective:    Patient ID: Sharon Gibson, female    DOB: October 13, 1980, 42 y.o.   MRN: [id number]  HPI: Sharon Gibson is a 42 y.o. female  Chief Complaint  Patient presents with   Cough    Patient says she tested positive for COVID over a month ago and says she is still experiencing symptoms. Patient says her inner ear hurt and feels like there is a burning sensation in her sinuses since having COVID. Patient has been symptomatic since testing positive for COVID.    Ear Pain   Sinus Problem   Had COVID at the end of January and didn't feel like she got all the way better, started to feel a lot worse again this week.   UPPER RESPIRATORY TRACT INFECTION Duration: 4 days Worst symptom: hoarseness, sore throat, cough Fever: yes Cough: yes Shortness of breath: yes Wheezing: no Chest pain: no Chest tightness: yes Chest congestion: yes Nasal congestion: yes Runny nose: yes Post nasal drip: yes Sneezing: no Sore throat: yes Swollen glands: yes Sinus pressure: yes Headache: yes Face pain: yes Toothache: yes Ear pain: yes bilateral Ear pressure: yes bilateral Eyes red/itching:no Eye drainage/crusting: yes  Vomiting: no Rash: no Fatigue: yes Sick contacts: yes Strep contacts: no  Context: worse Recurrent sinusitis: no Relief with OTC cold/cough medications: no  Treatments attempted: cold/sinus    Relevant past medical, surgical, family and social history reviewed and updated as indicated. Interim medical history since our last visit reviewed. Allergies and medications reviewed and updated.  Review of Systems  Constitutional:  Positive for fever. Negative for activity change, appetite change, chills, diaphoresis, fatigue and unexpected weight change.  HENT:  Positive for congestion, postnasal drip, rhinorrhea, sinus pressure, sinus pain and sore throat. Negative for dental  problem, drooling, ear discharge, ear pain, facial swelling, hearing loss, mouth sores, nosebleeds, sneezing, tinnitus, trouble swallowing and voice change.   Respiratory:  Positive for cough, chest tightness, shortness of breath and wheezing. Negative for apnea, choking and stridor.   Cardiovascular: Negative.   Gastrointestinal: Negative.   Skin: Negative.   Psychiatric/Behavioral: Negative.      Per HPI unless specifically indicated above     Objective:    BP (!) 88/57   Pulse 67   Temp 98.8 F (37.1 C) (Oral)   LMP 11/03/2022   SpO2 98%   Wt Readings from Last 3 Encounters:  10/25/22 300 lb (136.1 kg)  09/13/22 275 lb (124.7 kg)  06/03/22 271 lb 2.7 oz (123 kg)    Physical Exam Vitals and nursing note reviewed.  Constitutional:      General: She is not in acute distress.    Appearance: Normal appearance. She is ill-appearing. She is not toxic-appearing or diaphoretic.  HENT:     Head: Normocephalic and atraumatic.     Right Ear: Tympanic membrane, ear canal and external ear normal. There is no impacted cerumen.     Left Ear: Tympanic membrane, ear canal and external ear normal. There is no impacted cerumen.     Nose: Congestion and rhinorrhea present.     Mouth/Throat:     Mouth: Mucous membranes are moist.     Pharynx: Oropharynx is clear. Posterior oropharyngeal erythema present. No oropharyngeal exudate.  Eyes:     General: No scleral icterus.       Right eye: No  discharge.        Left eye: No discharge.     Extraocular Movements: Extraocular movements intact.     Conjunctiva/sclera: Conjunctivae normal.     Pupils: Pupils are equal, round, and reactive to light.  Cardiovascular:     Rate and Rhythm: Normal rate and regular rhythm.     Pulses: Normal pulses.     Heart sounds: Normal heart sounds. No murmur heard.    No friction rub. No gallop.  Pulmonary:     Effort: Pulmonary effort is normal. No respiratory distress.     Breath sounds: Normal breath sounds.  No stridor. No wheezing, rhonchi or rales.  Chest:     Chest wall: No tenderness.  Musculoskeletal:        General: Normal range of motion.     Cervical back: Normal range of motion and neck supple.  Skin:    General: Skin is warm and dry.     Capillary Refill: Capillary refill takes less than 2 seconds.     Coloration: Skin is not jaundiced or pale.     Findings: No bruising, erythema, lesion or rash.  Neurological:     General: No focal deficit present.     Mental Status: She is alert and oriented to person, place, and time. Mental status is at baseline.  Psychiatric:        Mood and Affect: Mood normal.        Behavior: Behavior normal.        Thought Content: Thought content normal.        Judgment: Judgment normal.     Results for orders placed or performed during the hospital encounter of 10/25/22  Resp panel by RT-PCR (RSV, Flu A&B, Covid) Anterior Nasal Swab   Specimen: Anterior Nasal Swab  Result Value Ref Range   SARS Coronavirus 2 by RT PCR POSITIVE (A) NEGATIVE   Influenza A by PCR NEGATIVE NEGATIVE   Influenza B by PCR NEGATIVE NEGATIVE   Resp Syncytial Virus by PCR NEGATIVE NEGATIVE      Assessment & Plan:   Problem List Items Addressed This Visit   None Visit Diagnoses     Acute non-recurrent maxillary sinusitis    -  Primary   Will treat with doxycycline and prednisone. Call if not getting better or getting worse. Continue to monitor.   Relevant Medications   doxycycline (VIBRA-TABS) 100 MG tablet   predniSONE (DELTASONE) 50 MG tablet   Sore throat       Flu and strep negative. Await COVID and RSV. Call with any concerns.   Relevant Orders   Veritor Flu A/B Waived   Rapid Strep Screen ([facility] ONLY)   COVID-19, Flu A+B and RSV        Follow up plan: Return if symptoms worsen or fail to improve.

## 2022-11-21 ENCOUNTER — Other Ambulatory Visit: Payer: Self-pay | Admitting: Family Medicine

## 2022-11-21 LAB — COVID-19, FLU A+B AND RSV
Influenza A, NAA: NOT DETECTED
Influenza B, NAA: NOT DETECTED
RSV, NAA: NOT DETECTED
SARS-CoV-2, NAA: DETECTED — AB

## 2022-11-21 MED ORDER — NIRMATRELVIR/RITONAVIR (PAXLOVID)TABLET: 3.0000 | ORAL_TABLET | Freq: Two times a day (BID) | ORAL | 0 refills | Status: AC

## 2022-11-23 ENCOUNTER — Inpatient Hospital Stay: Payer: BC Managed Care – PPO

## 2022-11-23 ENCOUNTER — Inpatient Hospital Stay: Payer: BC Managed Care – PPO | Admitting: Oncology

## 2022-11-23 ENCOUNTER — Encounter: Payer: Self-pay | Admitting: Family Medicine

## 2022-11-23 LAB — CULTURE, GROUP A STREP: Strep A Culture: NEGATIVE

## 2022-11-23 LAB — RAPID STREP SCREEN (MED CTR MEBANE ONLY): Strep Gp A Ag, IA W/Reflex: NEGATIVE

## 2022-11-23 LAB — VERITOR FLU A/B WAIVED
Influenza A: NEGATIVE
Influenza B: NEGATIVE

## 2022-12-18 ENCOUNTER — Inpatient Hospital Stay: Payer: BC Managed Care – PPO | Attending: Oncology

## 2022-12-18 DIAGNOSIS — D509 Iron deficiency anemia, unspecified: Secondary | ICD-10-CM | POA: Insufficient documentation

## 2022-12-18 DIAGNOSIS — E538 Deficiency of other specified B group vitamins: Secondary | ICD-10-CM | POA: Insufficient documentation

## 2022-12-18 DIAGNOSIS — Z9884 Bariatric surgery status: Secondary | ICD-10-CM

## 2022-12-18 DIAGNOSIS — D508 Other iron deficiency anemias: Secondary | ICD-10-CM

## 2022-12-18 LAB — CBC WITH DIFFERENTIAL/PLATELET
Abs Immature Granulocytes: 0.04 10*3/uL (ref 0.00–0.07)
Basophils Absolute: 0.1 10*3/uL (ref 0.0–0.1)
Basophils Relative: 1 %
Eosinophils Absolute: 0.4 10*3/uL (ref 0.0–0.5)
Eosinophils Relative: 5 %
HCT: 42.9 % (ref 36.0–46.0)
Hemoglobin: 14 g/dL (ref 12.0–15.0)
Immature Granulocytes: 1 %
Lymphocytes Relative: 28 %
Lymphs Abs: 2.4 10*3/uL (ref 0.7–4.0)
MCH: 28.2 pg (ref 26.0–34.0)
MCHC: 32.6 g/dL (ref 30.0–36.0)
MCV: 86.3 fL (ref 80.0–100.0)
Monocytes Absolute: 0.5 10*3/uL (ref 0.1–1.0)
Monocytes Relative: 6 %
Neutro Abs: 5 10*3/uL (ref 1.7–7.7)
Neutrophils Relative %: 59 %
Platelets: 272 10*3/uL (ref 150–400)
RBC: 4.97 MIL/uL (ref 3.87–5.11)
RDW: 13.5 % (ref 11.5–15.5)
WBC: 8.4 10*3/uL (ref 4.0–10.5)
nRBC: 0 % (ref 0.0–0.2)

## 2022-12-18 LAB — COMPREHENSIVE METABOLIC PANEL
ALT: 16 U/L (ref 0–44)
AST: 19 U/L (ref 15–41)
Albumin: 4.2 g/dL (ref 3.5–5.0)
Alkaline Phosphatase: 74 U/L (ref 38–126)
Anion gap: 5 (ref 5–15)
BUN: 22 mg/dL — ABNORMAL HIGH (ref 6–20)
CO2: 21 mmol/L — ABNORMAL LOW (ref 22–32)
Calcium: 8.9 mg/dL (ref 8.9–10.3)
Chloride: 108 mmol/L (ref 98–111)
Creatinine, Ser: 0.66 mg/dL (ref 0.44–1.00)
GFR, Estimated: >60 mL/min (ref >60)
Glucose, Bld: 95 mg/dL (ref 70–99)
Potassium: 3.9 mmol/L (ref 3.5–5.1)
Sodium: 134 mmol/L — ABNORMAL LOW (ref 135–145)
Total Bilirubin: 0.3 mg/dL (ref 0.3–1.2)
Total Protein: 7.3 g/dL (ref 6.5–8.1)

## 2022-12-18 LAB — IRON AND TIBC
Iron: 87 ug/dL (ref 28–170)
Saturation Ratios: 20 % (ref 10.4–31.8)
TIBC: 430 ug/dL (ref 250–450)
UIBC: 343 ug/dL

## 2022-12-18 LAB — FERRITIN: Ferritin: 16 ng/mL (ref 11–307)

## 2022-12-18 LAB — VITAMIN B12: Vitamin B-12: 276 pg/mL (ref 180–914)

## 2022-12-22 ENCOUNTER — Inpatient Hospital Stay: Payer: BC Managed Care – PPO

## 2022-12-22 ENCOUNTER — Encounter: Payer: Self-pay | Admitting: Oncology

## 2022-12-22 ENCOUNTER — Inpatient Hospital Stay (HOSPITAL_BASED_OUTPATIENT_CLINIC_OR_DEPARTMENT_OTHER): Payer: BC Managed Care – PPO | Admitting: Oncology

## 2022-12-22 VITALS — BP 128/48 | HR 76

## 2022-12-22 VITALS — BP 128/48 | HR 77 | Temp 97.1°F | Resp 18 | Wt 287.9 lb

## 2022-12-22 DIAGNOSIS — D509 Iron deficiency anemia, unspecified: Secondary | ICD-10-CM

## 2022-12-22 DIAGNOSIS — E538 Deficiency of other specified B group vitamins: Secondary | ICD-10-CM | POA: Diagnosis not present

## 2022-12-22 MED ORDER — DIPHENHYDRAMINE HCL 25 MG PO CAPS
50.0000 mg | ORAL_CAPSULE | Freq: Once | ORAL | Status: AC
  Administered 2022-12-22: 50 mg via ORAL
  Filled 2022-12-22: qty 2

## 2022-12-22 MED ORDER — VITAMIN B-12 2500 MCG SL SUBL: 1.0000 | SUBLINGUAL_TABLET | Freq: Every day | SUBLINGUAL | 1 refills | Status: DC

## 2022-12-22 MED ORDER — SODIUM CHLORIDE 0.9 % IV SOLN
10.0000 mg | Freq: Once | INTRAVENOUS | Status: AC
  Administered 2022-12-22: 10 mg via INTRAVENOUS
  Filled 2022-12-22: qty 10

## 2022-12-22 MED ORDER — SODIUM CHLORIDE 0.9 % IV SOLN
200.0000 mg | Freq: Once | INTRAVENOUS | Status: AC
  Administered 2022-12-22: 200 mg via INTRAVENOUS
  Filled 2022-12-22: qty 200

## 2022-12-22 MED ORDER — ACETAMINOPHEN 325 MG PO TABS
650.0000 mg | ORAL_TABLET | Freq: Once | ORAL | Status: AC
  Administered 2022-12-22: 650 mg via ORAL
  Filled 2022-12-22: qty 2

## 2022-12-22 MED ORDER — CYANOCOBALAMIN 1000 MCG/ML IJ SOLN
1000.0000 ug | Freq: Once | INTRAMUSCULAR | Status: AC
  Administered 2022-12-22: 1000 ug via INTRAMUSCULAR
  Filled 2022-12-22: qty 1

## 2022-12-22 MED ORDER — SODIUM CHLORIDE 0.9 % IV SOLN
Freq: Once | INTRAVENOUS | Status: AC
  Filled 2022-12-22: qty 250

## 2022-12-22 NOTE — Assessment & Plan Note (Signed)
Iron deficiency in the context of history of gastric bypass Labs reviewed and discussed with patient. Hemoglobin and iron panel are both stable. Recommend IV Venofer 200 mg x 1.  Patient declines pregnancy testing due to no chance of pregnancy.  She reports that she is currently not sexually active. Patient gets Tylenol 650 mg p.o. x1, Benadryl 50 mg p.o. x1, Decadron 10 mg x 1 as premed prior to Venofer treatment to minimize infusion reactions

## 2022-12-22 NOTE — Assessment & Plan Note (Signed)
Recommend to resuming monthly vitamin B12 injections. She prefers to receive B12 injections during her visits and take sublingual vitamin B12  daily at home.recommend to increase sublingual vitamin B12 dose to 2535mcg daily. Rx sent.  B12 injection x1 today. Repeat B12 at the next visit.

## 2022-12-22 NOTE — Progress Notes (Signed)
Hematology/Oncology Progress note [phone] [phone]     CHIEF COMPLIANT: Sharon Gibson is a 42 y.o. female presents for follow up of  iron and B12 deficiency   ASSESSMENT & PLAN:   Iron deficiency anemia Iron deficiency in the context of history of gastric bypass Labs reviewed and discussed with patient. Hemoglobin and iron panel are both stable. Recommend IV Venofer 200 mg x 1.  Patient declines pregnancy testing due to no chance of pregnancy.  She reports that she is currently not sexually active. Patient gets Tylenol 650 mg p.o. x1, Benadryl 50 mg p.o. x1, Decadron 10 mg x 1 as premed prior to Venofer treatment to minimize infusion reactions   B12 deficiency Recommend to resuming monthly vitamin B12 injections. She prefers to receive B12 injections during her visits and take sublingual vitamin B12  daily at home.recommend to increase sublingual vitamin B12 dose to 2526mcg daily. Rx sent.  B12 injection x1 today. Repeat B12 at the next visit.   Orders Placed This Encounter  Procedures   CBC with Differential ([place] Only)    Standing Status:   Future    Standing Expiration Date:   12/22/2023   Iron and TIBC    Standing Status:   Future    Standing Expiration Date:   12/22/2023   Ferritin    Standing Status:   Future    Standing Expiration Date:   12/22/2023   Vitamin B12    Standing Status:   Future    Standing Expiration Date:   12/22/2023   Folate    Standing Status:   Future    Standing Expiration Date:   12/22/2023   Follow up in 6 months.  All questions were answered. The patient knows to call the clinic with any problems, questions or concerns.  Earlie Server, MD, PhD [facility] Hematology Oncology 12/22/2022   PERTINENT HEMATOLOGY HISTORY Patient previously followed up by Dr.Corcoran, patient switched care to me on 05/08/21 Extensive medical record review was performed by me  #Patient has a history of gastric bypass. #Iron  deficiency anemia, she has received IV iron (different preparations) x 5 ([place], [place]; [place], [place];  [place], [place]) with reactions requiring Benadryl and steroids. Per patient she had side effects from IV Venofer treatments, including dizziness, lightheadedness, low blood pressure, excess sweating, aches, and pains, nausea, and headache after her infusion. She has received Feraheme treatments as well as Venofer treatments previously.  Most recently Venofer treatments.  Patient gets Benadryl, steroids as premed to minimize adverse reactions.  #She has B12 deficiency.  Patient was previously on monthly vitamin B12 injections. Due to her work, she has not been able to come to get monthly injections.   #History of multiple pulmonary emboli in 2003.  Per patient report, hypercoagulable work-up was negative.  She was on Coumadin x 6 months.  Not currently on anticoagulation.   INTERVAL HISTORY Sharon Gibson is a 42 y.o. female who has above history reviewed by me not presents for follow up visit for management of iron deficiency anemia and vitamin B12 deficiency Some fatigue, chronic, no other new complaints. She takes  sublingual vitamin B12 supplementation 1049mcg daily.  Past Medical History:  Diagnosis Date   Anemia    Anxiety    Chicken pox    Depression    Eating disorder    Has had residential treatment and hospitalization previously.   Heart murmur    as a child   History of anemia    RECIEVES  IRON INFUSIONS YEARLY   History of pulmonary embolus (PE) 2002   bilaterally-followed by [facility] hematology-w/u negative per pt   Hypothyroidism    no meds currently   PTSD (post-traumatic stress disorder)     Past Surgical History:  Procedure Laterality Date   CHOLECYSTECTOMY  2003   EXCISION OF ENDOMETRIOMA     GASTRIC BYPASS  2003   LAPAROSCOPIC OVARIAN CYSTECTOMY Left 05/10/2018   Procedure: LAPAROSCOPIC OVARIAN CYSTECTOMY;  Surgeon: Homero Fellers, MD;  Location: [facility] ORS;   Service: Gynecology;  Laterality: Left;    Family History  Problem Relation Age of Onset   Depression Father    Diabetes Father    Anxiety disorder Father    Alzheimer's disease Maternal Grandfather    Alzheimer's disease Maternal Grandmother    Prostate cancer Paternal Grandfather    Lung cancer Paternal Grandmother     Social History:  reports that she has never smoked. She has never used smokeless tobacco. She reports that she does not currently use alcohol. She reports that she does not use drugs.   Allergies:  Allergies  Allergen Reactions   Penicillins Hives    Has patient had a PCN reaction causing immediate rash, facial/tongue/throat swelling, SOB or lightheadedness with hypotension: yes Has patient had a PCN reaction causing severe rash involving mucus membranes or skin necrosis: no Has patient had a PCN reaction that required hospitalization: yes Has patient had a PCN reaction occurring within the last 10 years: no If all of the above answers are "NO", then may proceed with Cephalosporin use.    Strawberry (Diagnostic) Swelling    AND WALNUTS-TONGUE SWELLING   Other Other (See Comments)    Narcotics- Burning in the stomach (whether PO or IV)   Nsaids Other (See Comments)   Azithromycin Nausea Only    Current Medications: Current Outpatient Medications  Medication Sig Dispense Refill   Cyanocobalamin (VITAMIN B-12) 2500 MCG SUBL Place 1 tablet (2,500 mcg total) under the tongue daily at 2 PM. 90 tablet 1   diazepam (VALIUM) 2 MG tablet Take 1 pill 3 times a day as needed for vertigo. 30 tablet 0   gabapentin (NEURONTIN) 100 MG capsule Take 1 capsule (100 mg total) by mouth at bedtime. 90 capsule 1   hydrOXYzine (ATARAX) 25 MG tablet Take 1 tablet (25 mg total) by mouth daily. At night 90 tablet 1   levothyroxine (SYNTHROID) 125 MCG tablet Take 1 tablet (125 mcg total) by mouth daily. 30 tablet 1   meclizine (ANTIVERT) 25 MG tablet Take 1 tablet (25 mg total) by  mouth 3 (three) times daily as needed for dizziness. 30 tablet 0   EPINEPHrine 0.3 mg/0.3 mL IJ SOAJ injection Inject 0.3 mg into the muscle as needed for anaphylaxis. (Patient not taking: Reported on 12/22/2022)     sertraline (ZOLOFT) 100 MG tablet Take 2.5 tablets (250 mg total) by mouth as directed. 225 tablet 1   No current facility-administered medications for this visit.    Review of Systems  Constitutional:  Negative for chills, fever and malaise/fatigue.  HENT:  Negative for sore throat.   Eyes:  Negative for redness.  Respiratory:  Negative for cough, shortness of breath and wheezing.   Cardiovascular:  Negative for chest pain, palpitations and leg swelling.  Gastrointestinal:  Negative for abdominal pain, blood in stool, nausea and vomiting.  Genitourinary:  Negative for dysuria.  Musculoskeletal:  Negative for myalgias.  Skin:  Negative for rash.  Neurological:  Negative for dizziness,  tingling and tremors.  Endo/Heme/Allergies:  Does not bruise/bleed easily.  Psychiatric/Behavioral:  Negative for hallucinations.    Performance status (ECOG): 1  Vitals Blood pressure (!) 128/48, pulse 77, temperature (!) 97.1 F (36.2 C), resp. rate 18, weight 287 lb 14.4 oz (130.6 kg).   Physical Exam Constitutional:      General: She is not in acute distress.    Appearance: She is well-developed. She is not diaphoretic.  HENT:     Head: Normocephalic.  Eyes:     General: No scleral icterus.    Pupils: Pupils are equal, round, and reactive to light.  Neck:     Vascular: No JVD.  Cardiovascular:     Rate and Rhythm: Normal rate and regular rhythm.     Heart sounds: Murmur heard.  Pulmonary:     Effort: Pulmonary effort is normal. No respiratory distress.     Breath sounds: Normal breath sounds.  Abdominal:     General: Bowel sounds are normal. There is no distension.     Palpations: Abdomen is soft.  Musculoskeletal:        General: Normal range of motion.     Cervical  back: Normal range of motion and neck supple.  Lymphadenopathy:     Head:     Right side of head: No preauricular, posterior auricular or occipital adenopathy.     Left side of head: No preauricular, posterior auricular or occipital adenopathy.     Cervical: No cervical adenopathy.     Upper Body:     Right upper body: No supraclavicular adenopathy.     Left upper body: No supraclavicular adenopathy.     Lower Body: No right inguinal adenopathy. No left inguinal adenopathy.  Skin:    General: Skin is warm and dry.  Neurological:     Mental Status: She is alert and oriented to person, place, and time. Mental status is at baseline.  Psychiatric:        Mood and Affect: Mood normal.    Laboratory studies    Latest Ref Rng & Units 12/18/2022   12:05 PM 09/13/2022    2:40 PM 06/03/2022    9:16 AM  CBC  WBC 4.0 - 10.5 K/uL 8.4  7.7  6.5   Hemoglobin 12.0 - 15.0 g/dL 14.0  13.6  12.3   Hematocrit 36.0 - 46.0 % 42.9  43.1  38.5   Platelets 150 - 400 K/uL 272  301  283       Latest Ref Rng & Units 12/18/2022   12:05 PM 09/13/2022    2:40 PM 04/03/2021    4:25 PM  CMP  Glucose 70 - 99 mg/dL 95  93  93   BUN 6 - 20 mg/dL 22  20  23    Creatinine 0.44 - 1.00 mg/dL 0.66  0.67  0.83   Sodium 135 - 145 mmol/L 134  138  139   Potassium 3.5 - 5.1 mmol/L 3.9  4.0  4.6   Chloride 98 - 111 mmol/L 108  108  103   CO2 22 - 32 mmol/L 21  23  20    Calcium 8.9 - 10.3 mg/dL 8.9  9.1  10.1   Total Protein 6.5 - 8.1 g/dL 7.3   6.9   Total Bilirubin 0.3 - 1.2 mg/dL 0.3   0.2   Alkaline Phos 38 - 126 U/L 74   97   AST 15 - 41 U/L 19   16   ALT 0 - 44  U/L 16   15    Lab Results  Component Value Date   IRON 87 12/18/2022   TIBC 430 12/18/2022   FERRITIN 16 12/18/2022

## 2022-12-29 ENCOUNTER — Encounter: Payer: Self-pay | Admitting: Family Medicine

## 2022-12-29 ENCOUNTER — Ambulatory Visit (INDEPENDENT_AMBULATORY_CARE_PROVIDER_SITE_OTHER): Payer: BC Managed Care – PPO | Admitting: Family Medicine

## 2022-12-29 VITALS — BP 105/67 | HR 57 | Temp 98.1°F | Ht 64.0 in | Wt 284.8 lb

## 2022-12-29 DIAGNOSIS — E538 Deficiency of other specified B group vitamins: Secondary | ICD-10-CM

## 2022-12-29 DIAGNOSIS — H8103 Meniere's disease, bilateral: Secondary | ICD-10-CM

## 2022-12-29 DIAGNOSIS — F332 Major depressive disorder, recurrent severe without psychotic features: Secondary | ICD-10-CM

## 2022-12-29 DIAGNOSIS — E559 Vitamin D deficiency, unspecified: Secondary | ICD-10-CM

## 2022-12-29 DIAGNOSIS — F4310 Post-traumatic stress disorder, unspecified: Secondary | ICD-10-CM

## 2022-12-29 DIAGNOSIS — Z0000 Encounter for general adult medical examination without abnormal findings: Secondary | ICD-10-CM

## 2022-12-29 DIAGNOSIS — Z1231 Encounter for screening mammogram for malignant neoplasm of breast: Secondary | ICD-10-CM

## 2022-12-29 DIAGNOSIS — F411 Generalized anxiety disorder: Secondary | ICD-10-CM

## 2022-12-29 DIAGNOSIS — D508 Other iron deficiency anemias: Secondary | ICD-10-CM

## 2022-12-29 DIAGNOSIS — M79602 Pain in left arm: Secondary | ICD-10-CM

## 2022-12-29 DIAGNOSIS — E039 Hypothyroidism, unspecified: Secondary | ICD-10-CM | POA: Diagnosis not present

## 2022-12-29 LAB — URINALYSIS, ROUTINE W REFLEX MICROSCOPIC
Bilirubin, UA: NEGATIVE
Glucose, UA: NEGATIVE
Ketones, UA: NEGATIVE
Leukocytes,UA: NEGATIVE
Nitrite, UA: NEGATIVE
Protein,UA: NEGATIVE
RBC, UA: NEGATIVE
Specific Gravity, UA: 1.02 (ref 1.005–1.030)
Urobilinogen, Ur: 0.2 mg/dL (ref 0.2–1.0)
pH, UA: 5.5 (ref 5.0–7.5)

## 2022-12-29 MED ORDER — SERTRALINE HCL 100 MG PO TABS: 250.0000 mg | ORAL_TABLET | ORAL | 1 refills | Status: DC

## 2022-12-29 MED ORDER — HYDROXYZINE HCL 25 MG PO TABS: 50.0000 mg | ORAL_TABLET | Freq: Every day | ORAL | 1 refills | Status: DC

## 2022-12-29 MED ORDER — GABAPENTIN 100 MG PO CAPS: 100.0000 mg | ORAL_CAPSULE | Freq: Every day | ORAL | 1 refills | Status: DC

## 2022-12-29 NOTE — Progress Notes (Unsigned)
BP 105/67   Pulse (!) 57   Temp 98.1 F (36.7 C) (Oral)   Ht 5\' 4"  (1.626 m)   Wt 284 lb 12.8 oz (129.2 kg)   SpO2 97%   BMI 48.89 kg/m    Subjective:    Patient ID: Sharon Gibson, female    DOB: 28-Mar-1981, 42 y.o.   MRN: [id number]  HPI: Sharon Gibson is a 42 y.o. female presenting on 12/29/2022 for comprehensive medical examination. Current medical complaints include:  Got an iron infusion last week and has had hard and painful area on her L arm since then.   ANXIETY/DEPRESSION- seeing psychiatry again.  Duration: chronic Status:{Blank single:19197::"controlled","uncontrolled","better","worse","exacerbated","stable"} Anxious mood: {Blank single:19197::"yes","no"}  Excessive worrying: {Blank single:19197::"yes","no"} Irritability: {Blank single:19197::"yes","no"}  Sweating: {Blank single:19197::"yes","no"} Nausea: {Blank single:19197::"yes","no"} Palpitations:{Blank single:19197::"yes","no"} Hyperventilation: {Blank single:19197::"yes","no"} Panic attacks: {Blank single:19197::"yes","no"} Agoraphobia: {Blank single:19197::"yes","no"}  Obscessions/compulsions: {Blank single:19197::"yes","no"} Depressed mood: {Blank single:19197::"yes","no"}    12/29/2022   10:28 AM 11/20/2022    8:14 AM 11/04/2022    1:48 PM 07/14/2022    3:23 PM 06/29/2022    3:20 PM  Depression screen PHQ 2/9  Decreased Interest 0 1 2 3 2   Down, Depressed, Hopeless 2 3 2 3 2   PHQ - 2 Score 2 4 4 6 4   Altered sleeping 3 1 1 3 1   Tired, decreased energy 3 3 2 3 2   Change in appetite 3 3 1 3 1   Feeling bad or failure about yourself  3 3 2 3 3   Trouble concentrating 2 0 1  1  Moving slowly or fidgety/restless 0 0 0 3 0  Suicidal thoughts 1 0 0 1 1  PHQ-9 Score 17 14 11 22 13   Difficult doing work/chores Somewhat difficult Very difficult Somewhat difficult Extremely dIfficult Somewhat difficult   Anhedonia: {Blank single:19197::"yes","no"} Weight changes: {Blank  single:19197::"yes","no"} Insomnia: {Blank single:19197::"yes","no"} {Blank single:19197::"hard to fall asleep","hard to stay asleep"}  Hypersomnia: {Blank single:19197::"yes","no"} Fatigue/loss of energy: {Blank single:19197::"yes","no"} Feelings of worthlessness: {Blank single:19197::"yes","no"} Feelings of guilt: {Blank single:19197::"yes","no"} Impaired concentration/indecisiveness: {Blank single:19197::"yes","no"} Suicidal ideations: {Blank single:19197::"yes","no"}  Crying spells: {Blank single:19197::"yes","no"} Recent Stressors/Life Changes: {Blank single:19197::"yes","no"}   Relationship problems: {Blank single:19197::"yes","no"}   Family stress: {Blank single:19197::"yes","no"}     Financial stress: {Blank single:19197::"yes","no"}    Job stress: {Blank single:19197::"yes","no"}    Recent death/loss: {Blank single:19197::"yes","no"}  HYPOTHYROIDISM- has not been taking her thyroid medicine.  Thyroid control status:{Blank single:19197::"controlled","uncontrolled","better","worse","exacerbated","stable"} Satisfied with current treatment? {Blank single:19197::"yes","no"} Medication side effects: {Blank single:19197::"yes","no"} Medication compliance: {Blank single:19197::"excellent compliance","good compliance","fair compliance","poor compliance"} Etiology of hypothyroidism:  Recent dose adjustment:{Blank single:19197::"yes","no"} Fatigue: {Blank single:19197::"yes","no"} Cold intolerance: {Blank single:19197::"yes","no"} Heat intolerance: {Blank single:19197::"yes","no"} Weight gain: {Blank single:19197::"yes","no"} Weight loss: {Blank single:19197::"yes","no"} Constipation: {Blank single:19197::"yes","no"} Diarrhea/loose stools: {Blank single:19197::"yes","no"} Palpitations: {Blank single:19197::"yes","no"} Lower extremity edema: {Blank single:19197::"yes","no"} Anxiety/depressed mood: {Blank single:19197::"yes","no"}  She currently lives with: parents Menopausal Symptoms:  no  Depression Screen done today and results listed below:     12/29/2022   10:28 AM 11/20/2022    8:14 AM 11/04/2022    1:48 PM 07/14/2022    3:23 PM 06/29/2022    3:20 PM  Depression screen PHQ 2/9  Decreased Interest 0 1 2 3 2   Down, Depressed, Hopeless 2 3 2 3 2   PHQ - 2 Score 2 4 4 6 4   Altered sleeping 3 1 1 3 1   Tired, decreased energy 3 3 2 3 2   Change in appetite 3 3 1 3 1   Feeling bad or failure about yourself  3 3 2 3  3  Trouble concentrating 2 0 1  1  Moving slowly or fidgety/restless 0 0 0 3 0  Suicidal thoughts 1 0 0 1 1  PHQ-9 Score 17 14 11 22 13   Difficult doing work/chores Somewhat difficult Very difficult Somewhat difficult Extremely dIfficult Somewhat difficult    Past Medical History:  Past Medical History:  Diagnosis Date   Anemia    Anxiety    Chicken pox    Depression    Eating disorder    Has had residential treatment and hospitalization previously.   Heart murmur    as a child   History of anemia    RECIEVES IRON INFUSIONS YEARLY   History of pulmonary embolus (PE) 2002   bilaterally-followed by [facility] hematology-w/u negative per pt   Hypothyroidism    no meds currently   PTSD (post-traumatic stress disorder)     Surgical History:  Past Surgical History:  Procedure Laterality Date   CHOLECYSTECTOMY  2003   EXCISION OF ENDOMETRIOMA     GASTRIC BYPASS  2003   LAPAROSCOPIC OVARIAN CYSTECTOMY Left 05/10/2018   Procedure: LAPAROSCOPIC OVARIAN CYSTECTOMY;  Surgeon: Homero Fellers, MD;  Location: [facility] ORS;  Service: Gynecology;  Laterality: Left;    Medications:  Current Outpatient Medications on File Prior to Visit  Medication Sig   Cyanocobalamin (VITAMIN B-12) 2500 MCG SUBL Place 1 tablet (2,500 mcg total) under the tongue daily at 2 PM.   diazepam (VALIUM) 2 MG tablet Take 1 pill 3 times a day as needed for vertigo.   gabapentin (NEURONTIN) 100 MG capsule Take 1 capsule (100 mg total) by mouth at bedtime.   hydrOXYzine (ATARAX) 25 MG  tablet Take 1 tablet (25 mg total) by mouth daily. At night   levothyroxine (SYNTHROID) 125 MCG tablet Take 1 tablet (125 mcg total) by mouth daily.   meclizine (ANTIVERT) 25 MG tablet Take 1 tablet (25 mg total) by mouth 3 (three) times daily as needed for dizziness.   sertraline (ZOLOFT) 100 MG tablet Take 2.5 tablets (250 mg total) by mouth as directed.   EPINEPHrine 0.3 mg/0.3 mL IJ SOAJ injection Inject 0.3 mg into the muscle as needed for anaphylaxis. (Patient not taking: Reported on 12/22/2022)   No current facility-administered medications on file prior to visit.    Allergies:  Allergies  Allergen Reactions   Penicillins Hives    Has patient had a PCN reaction causing immediate rash, facial/tongue/throat swelling, SOB or lightheadedness with hypotension: yes Has patient had a PCN reaction causing severe rash involving mucus membranes or skin necrosis: no Has patient had a PCN reaction that required hospitalization: yes Has patient had a PCN reaction occurring within the last 10 years: no If all of the above answers are "NO", then may proceed with Cephalosporin use.    Strawberry (Diagnostic) Swelling    AND WALNUTS-TONGUE SWELLING   Other Other (See Comments)    Narcotics- Burning in the stomach (whether PO or IV)   Nsaids Other (See Comments)   Azithromycin Nausea Only    Social History:  Social History   Socioeconomic History   Marital status: Single    Spouse name: Not on file   Number of children: 0   Years of education: Not on file   Highest education level: Master's degree (e.g., MA, MS, MEng, MEd, MSW, MBA)  Occupational History   Not on file  Tobacco Use   Smoking status: Never   Smokeless tobacco: Never  Vaping Use   Vaping Use: Never used  Substance and Sexual Activity   Alcohol use: Not Currently    Alcohol/week: 0.0 - 1.0 standard drinks of alcohol    Comment: rare   Drug use: No   Sexual activity: Not Currently  Other Topics Concern   Not on file   Social History Narrative   Not on file   Social Determinants of Health   Financial Resource Strain: Not on file  Food Insecurity: Not on file  Transportation Needs: Not on file  Physical Activity: Sufficiently Active (02/03/2018)   Exercise Vital Sign    Days of Exercise per Week: 7 days    Minutes of Exercise per Session: 60 min  Stress: Stress Concern Present (02/03/2018)   [place]    Feeling of Stress : Very much  Social Connections: Not on file  Intimate Partner Violence: Not on file   Social History   Tobacco Use  Smoking Status Never  Smokeless Tobacco Never   Social History   Substance and Sexual Activity  Alcohol Use Not Currently   Alcohol/week: 0.0 - 1.0 standard drinks of alcohol   Comment: rare    Family History:  Family History  Problem Relation Age of Onset   Depression Father    Diabetes Father    Anxiety disorder Father    Alzheimer's disease Maternal Grandfather    Alzheimer's disease Maternal Grandmother    Prostate cancer Paternal Grandfather    Lung cancer Paternal Grandmother     Past medical history, surgical history, medications, allergies, family history and social history reviewed with patient today and changes made to appropriate areas of the chart.   Review of Systems  Constitutional: Negative.   HENT:  Positive for tinnitus. Negative for congestion, ear discharge, ear pain, hearing loss, nosebleeds, sinus pain and sore throat.   Eyes: Negative.   Respiratory:  Negative for cough, hemoptysis, sputum production, shortness of breath, wheezing and stridor.   Cardiovascular:  Positive for chest pain (anxiety related), palpitations (anxiety related) and leg swelling. Negative for orthopnea, claudication and PND.  Gastrointestinal: Negative.   Genitourinary: Negative.   Musculoskeletal: Negative.   Skin:  Positive for rash. Negative for itching.  Neurological:  Positive for  dizziness. Negative for tingling, tremors, sensory change, speech change, focal weakness, seizures, loss of consciousness, weakness and headaches.  Endo/Heme/Allergies:  Positive for polydipsia. Negative for environmental allergies. Does not bruise/bleed easily.  Psychiatric/Behavioral:  Positive for depression. Negative for hallucinations, memory loss, substance abuse and suicidal ideas. The patient is nervous/anxious. The patient does not have insomnia.    All other ROS negative except what is listed above and in the HPI.      Objective:    BP 105/67   Pulse (!) 57   Temp 98.1 F (36.7 C) (Oral)   Ht 5\' 4"  (1.626 m)   Wt 284 lb 12.8 oz (129.2 kg)   SpO2 97%   BMI 48.89 kg/m   Wt Readings from Last 3 Encounters:  12/29/22 284 lb 12.8 oz (129.2 kg)  12/22/22 287 lb 14.4 oz (130.6 kg)  10/25/22 300 lb (136.1 kg)    284 lb 12.8 oz  Physical Exam Vitals and nursing note reviewed.  Constitutional:      General: She is not in acute distress.    Appearance: Normal appearance. She is not ill-appearing, toxic-appearing or diaphoretic.  HENT:     Head: Normocephalic and atraumatic.     Right Ear: Tympanic membrane, ear canal and external ear  normal. There is no impacted cerumen.     Left Ear: Tympanic membrane, ear canal and external ear normal. There is no impacted cerumen.     Nose: Nose normal. No congestion or rhinorrhea.     Mouth/Throat:     Mouth: Mucous membranes are moist.     Pharynx: Oropharynx is clear. No oropharyngeal exudate or posterior oropharyngeal erythema.  Eyes:     General: No scleral icterus.       Right eye: No discharge.        Left eye: No discharge.     Extraocular Movements: Extraocular movements intact.     Conjunctiva/sclera: Conjunctivae normal.     Pupils: Pupils are equal, round, and reactive to light.  Neck:     Vascular: No carotid bruit.  Cardiovascular:     Rate and Rhythm: Normal rate and regular rhythm.     Pulses: Normal pulses.      Heart sounds: No murmur heard.    No friction rub. No gallop.  Pulmonary:     Effort: Pulmonary effort is normal. No respiratory distress.     Breath sounds: Normal breath sounds. No stridor. No wheezing, rhonchi or rales.  Chest:     Chest wall: No tenderness.  Abdominal:     General: Abdomen is flat. Bowel sounds are normal. There is no distension.     Palpations: Abdomen is soft. There is no mass.     Tenderness: There is no abdominal tenderness. There is no right CVA tenderness, left CVA tenderness, guarding or rebound.     Hernia: No hernia is present.  Genitourinary:    Comments: Breast and pelvic exams deferred with shared decision making Musculoskeletal:        General: No swelling, tenderness, deformity or signs of injury.     Cervical back: Normal range of motion and neck supple. No rigidity. No muscular tenderness.     Right lower leg: No edema.     Left lower leg: No edema.  Lymphadenopathy:     Cervical: No cervical adenopathy.  Skin:    General: Skin is warm and dry.     Capillary Refill: Capillary refill takes less than 2 seconds.     Coloration: Skin is not jaundiced or pale.     Findings: No bruising, erythema, lesion or rash.  Neurological:     General: No focal deficit present.     Mental Status: She is alert and oriented to person, place, and time. Mental status is at baseline.     Cranial Nerves: No cranial nerve deficit.     Sensory: No sensory deficit.     Motor: No weakness.     Coordination: Coordination normal.     Gait: Gait normal.     Deep Tendon Reflexes: Reflexes normal.  Psychiatric:        Mood and Affect: Mood normal.        Behavior: Behavior normal.        Thought Content: Thought content normal.        Judgment: Judgment normal.    Results for orders placed or performed in visit on 12/18/22  Comprehensive metabolic panel  Result Value Ref Range   Sodium 134 (L) 135 - 145 mmol/L   Potassium 3.9 3.5 - 5.1 mmol/L   Chloride 108 98 - 111  mmol/L   CO2 21 (L) 22 - 32 mmol/L   Glucose, Bld 95 70 - 99 mg/dL   BUN 22 (H) 6 - 20 mg/dL  Creatinine, Ser 0.66 0.44 - 1.00 mg/dL   Calcium 8.9 8.9 - 10.3 mg/dL   Total Protein 7.3 6.5 - 8.1 g/dL   Albumin 4.2 3.5 - 5.0 g/dL   AST 19 15 - 41 U/L   ALT 16 0 - 44 U/L   Alkaline Phosphatase 74 38 - 126 U/L   Total Bilirubin 0.3 0.3 - 1.2 mg/dL   GFR, Estimated >60 >60 mL/min   Anion gap 5 5 - 15  Vitamin B12  Result Value Ref Range   Vitamin B-12 276 180 - 914 pg/mL  Ferritin  Result Value Ref Range   Ferritin 16 11 - 307 ng/mL  Iron and TIBC([facility])  Result Value Ref Range   Iron 87 28 - 170 ug/dL   TIBC 430 250 - 450 ug/dL   Saturation Ratios 20 10.4 - 31.8 %   UIBC 343 ug/dL  CBC with Differential  Result Value Ref Range   WBC 8.4 4.0 - 10.5 K/uL   RBC 4.97 3.87 - 5.11 MIL/uL   Hemoglobin 14.0 12.0 - 15.0 g/dL   HCT 42.9 36.0 - 46.0 %   MCV 86.3 80.0 - 100.0 fL   MCH 28.2 26.0 - 34.0 pg   MCHC 32.6 30.0 - 36.0 g/dL   RDW 13.5 11.5 - 15.5 %   Platelets 272 150 - 400 K/uL   nRBC 0.0 0.0 - 0.2 %   Neutrophils Relative % 59 %   Neutro Abs 5.0 1.7 - 7.7 K/uL   Lymphocytes Relative 28 %   Lymphs Abs 2.4 0.7 - 4.0 K/uL   Monocytes Relative 6 %   Monocytes Absolute 0.5 0.1 - 1.0 K/uL   Eosinophils Relative 5 %   Eosinophils Absolute 0.4 0.0 - 0.5 K/uL   Basophils Relative 1 %   Basophils Absolute 0.1 0.0 - 0.1 K/uL   Immature Granulocytes 1 %   Abs Immature Granulocytes 0.04 0.00 - 0.07 K/uL      Assessment & Plan:   Problem List Items Addressed This Visit   None    Follow up plan: No follow-ups on file.   LABORATORY TESTING:  - Pap smear: up to date  IMMUNIZATIONS:   - Tdap: Tetanus vaccination status reviewed: last tetanus booster within 10 years. - Influenza: Up to date - Pneumovax: Not applicable - Prevnar: Not applicable - COVID: Up to date - HPV: Up to date - Shingrix vaccine: Not applicable  SCREENING: -Mammogram: Ordered today    PATIENT COUNSELING:   Advised to take 1 mg of folate supplement per day if capable of pregnancy.   Sexuality: Discussed sexually transmitted diseases, partner selection, use of condoms, avoidance of unintended pregnancy  and contraceptive alternatives.   Advised to avoid cigarette smoking.  I discussed with the patient that most people either abstain from alcohol or drink within safe limits (<=14/week and <=4 drinks/occasion for males, <=7/weeks and <= 3 drinks/occasion for females) and that the risk for alcohol disorders and other health effects rises proportionally with the number of drinks per week and how often a drinker exceeds daily limits.  Discussed cessation/primary prevention of drug use and availability of treatment for abuse.   Diet: Encouraged to adjust caloric intake to maintain  or achieve ideal body weight, to reduce intake of dietary saturated fat and total fat, to limit sodium intake by avoiding high sodium foods and not adding table salt, and to maintain adequate dietary potassium and calcium preferably from fresh fruits, vegetables, and low-fat dairy products.  stressed the importance of regular exercise  Injury prevention: Discussed safety belts, safety helmets, smoke detector, smoking near bedding or upholstery.   Dental health: Discussed importance of regular tooth brushing, flossing, and dental visits.    NEXT PREVENTATIVE PHYSICAL DUE IN 1 YEAR. No follow-ups on file.

## 2022-12-29 NOTE — Patient Instructions (Signed)
Please call to schedule your mammogram: [facility]  Address: [address], [place], [place] [address] Phone: [phone]  [facility] [place] [address] [place],  [place]  [address] Phone: [phone]   

## 2022-12-31 NOTE — Assessment & Plan Note (Signed)
Continue to follow with ENT. Call with any concerns.

## 2022-12-31 NOTE — Assessment & Plan Note (Signed)
Rechecking labs today. Await results. Treat as needed.  °

## 2022-12-31 NOTE — Assessment & Plan Note (Signed)
Seeing psychiatry again. Not doing great, but OK. Discussed talking about another option for her sleep as BP is running low on the clonidine.

## 2022-12-31 NOTE — Assessment & Plan Note (Signed)
Recent infusion. Continue to follow with hematology. Call with any concerns.

## 2023-01-02 LAB — CBC WITH DIFFERENTIAL/PLATELET
Basophils Absolute: 0.1 10*3/uL (ref 0.0–0.2)
Basos: 1 %
EOS (ABSOLUTE): 0.3 10*3/uL (ref 0.0–0.4)
Eos: 3 %
Hematocrit: 40.1 % (ref 34.0–46.6)
Hemoglobin: 13.2 g/dL (ref 11.1–15.9)
Immature Grans (Abs): 0.1 10*3/uL (ref 0.0–0.1)
Immature Granulocytes: 1 %
Lymphocytes Absolute: 2.2 10*3/uL (ref 0.7–3.1)
Lymphs: 28 %
MCH: 27.7 pg (ref 26.6–33.0)
MCHC: 32.9 g/dL (ref 31.5–35.7)
MCV: 84 fL (ref 79–97)
Monocytes Absolute: 0.5 10*3/uL (ref 0.1–0.9)
Monocytes: 7 %
Neutrophils Absolute: 4.7 10*3/uL (ref 1.4–7.0)
Neutrophils: 60 %
Platelets: 288 10*3/uL (ref 150–450)
RBC: 4.76 x10E6/uL (ref 3.77–5.28)
RDW: 13.2 % (ref 11.7–15.4)
WBC: 7.9 10*3/uL (ref 3.4–10.8)

## 2023-01-02 LAB — COMPREHENSIVE METABOLIC PANEL
ALT: 19 IU/L (ref 0–32)
AST: 20 IU/L (ref 0–40)
Albumin/Globulin Ratio: 2 (ref 1.2–2.2)
Albumin: 4.4 g/dL (ref 3.9–4.9)
Alkaline Phosphatase: 102 IU/L (ref 44–121)
BUN/Creatinine Ratio: 20 (ref 9–23)
BUN: 15 mg/dL (ref 6–24)
Bilirubin Total: 0.3 mg/dL (ref 0.0–1.2)
CO2: 17 mmol/L — ABNORMAL LOW (ref 20–29)
Calcium: 9.7 mg/dL (ref 8.7–10.2)
Chloride: 105 mmol/L (ref 96–106)
Creatinine, Ser: 0.75 mg/dL (ref 0.57–1.00)
Globulin, Total: 2.2 g/dL (ref 1.5–4.5)
Glucose: 91 mg/dL (ref 70–99)
Potassium: 4.6 mmol/L (ref 3.5–5.2)
Sodium: 139 mmol/L (ref 134–144)
Total Protein: 6.6 g/dL (ref 6.0–8.5)
eGFR: 102 mL/min/{1.73_m2} (ref >59)

## 2023-01-02 LAB — VITAMIN D 25 HYDROXY (VIT D DEFICIENCY, FRACTURES): Vit D, 25-Hydroxy: 14.7 ng/mL — ABNORMAL LOW (ref 30.0–100.0)

## 2023-01-02 LAB — LIPID PANEL W/O CHOL/HDL RATIO
Cholesterol, Total: 238 mg/dL — ABNORMAL HIGH (ref 100–199)
HDL: 65 mg/dL (ref >39)
LDL Chol Calc (NIH): 149 mg/dL — ABNORMAL HIGH (ref 0–99)
Triglycerides: 138 mg/dL (ref 0–149)
VLDL Cholesterol Cal: 24 mg/dL (ref 5–40)

## 2023-01-02 LAB — TSH: TSH: 5.75 u[IU]/mL — ABNORMAL HIGH (ref 0.450–4.500)

## 2023-01-02 LAB — VITAMIN B12

## 2023-01-12 ENCOUNTER — Other Ambulatory Visit: Payer: Self-pay | Admitting: Family Medicine

## 2023-01-12 MED ORDER — VITAMIN D (ERGOCALCIFEROL) 1.25 MG (50000 UNIT) PO CAPS: 50000.0000 [IU] | ORAL_CAPSULE | ORAL | 1 refills | Status: DC

## 2023-02-25 ENCOUNTER — Ambulatory Visit: Payer: BC Managed Care – PPO | Admitting: Family Medicine

## 2023-02-25 ENCOUNTER — Encounter: Payer: Self-pay | Admitting: Family Medicine

## 2023-02-25 VITALS — BP 101/64 | HR 62 | Temp 98.4°F | Ht 64.0 in | Wt 282.0 lb

## 2023-02-25 DIAGNOSIS — E039 Hypothyroidism, unspecified: Secondary | ICD-10-CM | POA: Diagnosis not present

## 2023-02-25 MED ORDER — LEVOTHYROXINE SODIUM 25 MCG PO TABS: 25.0000 ug | ORAL_TABLET | Freq: Every day | ORAL | 1 refills | Status: DC

## 2023-02-25 NOTE — Assessment & Plan Note (Signed)
Has been taking her thyroid regularly. Feeling hyperthyroid. Will cut her dose down significantly and recheck in 6 weeks. Call with any concerns. Continue to monitor.

## 2023-02-25 NOTE — Progress Notes (Signed)
BP 101/64   Pulse 62   Temp 98.4 F (36.9 C) (Oral)   Ht 5\' 4"  (1.626 m)   Wt 282 lb (127.9 kg)   SpO2 100%   BMI 48.41 kg/m    Subjective:    Patient ID: Sharon Gibson, female    DOB: October 28, 1980, 42 y.o.   MRN: [id number]  HPI: Sharon Gibson is a 42 y.o. female  Chief Complaint  Patient presents with   Dizziness   Fatigue   Medication Problem    Patient says she is really dizzy and her energy is crashing really quickly. Patient says she think it may be her Thyroid medication. Patient says she had not been taking it before her last appointment and has since started back and thinks the dosage may be too high.   HYPOTHYROIDISM Thyroid control status: overtreated Satisfied with current treatment? no Medication side effects: yes Medication compliance: good compliance Recent dose adjustment:yes Fatigue: yes Cold intolerance: no Heat intolerance: yes Weight gain: no Weight loss: no Constipation: no Diarrhea/loose stools: yes Palpitations: yes Lower extremity edema: no Anxiety/depressed mood: yes  Relevant past medical, surgical, family and social history reviewed and updated as indicated. Interim medical history since our last visit reviewed. Allergies and medications reviewed and updated.  Review of Systems  Constitutional:  Positive for fatigue. Negative for activity change, appetite change, chills, diaphoresis, fever and unexpected weight change.  Respiratory: Negative.    Cardiovascular: Negative.   Gastrointestinal: Negative.   Musculoskeletal: Negative.   Skin: Negative.   Neurological: Negative.   Hematological: Negative.   Psychiatric/Behavioral:  Negative for agitation, behavioral problems, confusion, decreased concentration, dysphoric mood, hallucinations, self-injury, sleep disturbance and suicidal ideas. The patient is nervous/anxious. The patient is not hyperactive.     Per HPI unless specifically indicated above     Objective:    BP  101/64   Pulse 62   Temp 98.4 F (36.9 C) (Oral)   Ht 5\' 4"  (1.626 m)   Wt 282 lb (127.9 kg)   SpO2 100%   BMI 48.41 kg/m   Wt Readings from Last 3 Encounters:  02/25/23 282 lb (127.9 kg)  12/29/22 284 lb 12.8 oz (129.2 kg)  12/22/22 287 lb 14.4 oz (130.6 kg)    Physical Exam Vitals and nursing note reviewed.  Constitutional:      General: She is not in acute distress.    Appearance: Normal appearance. She is not ill-appearing, toxic-appearing or diaphoretic.  HENT:     Head: Normocephalic and atraumatic.     Right Ear: External ear normal.     Left Ear: External ear normal.     Nose: Nose normal.     Mouth/Throat:     Mouth: Mucous membranes are moist.     Pharynx: Oropharynx is clear.  Eyes:     General: No scleral icterus.       Right eye: No discharge.        Left eye: No discharge.     Extraocular Movements: Extraocular movements intact.     Conjunctiva/sclera: Conjunctivae normal.     Pupils: Pupils are equal, round, and reactive to light.  Cardiovascular:     Rate and Rhythm: Normal rate and regular rhythm.     Pulses: Normal pulses.     Heart sounds: Normal heart sounds. No murmur heard.    No friction rub. No gallop.  Pulmonary:     Effort: Pulmonary effort is normal. No respiratory distress.     Breath sounds: Normal  breath sounds. No stridor. No wheezing, rhonchi or rales.  Chest:     Chest wall: No tenderness.  Musculoskeletal:        General: Normal range of motion.     Cervical back: Normal range of motion and neck supple.  Skin:    General: Skin is warm and dry.     Capillary Refill: Capillary refill takes less than 2 seconds.     Coloration: Skin is not jaundiced or pale.     Findings: No bruising, erythema, lesion or rash.  Neurological:     General: No focal deficit present.     Mental Status: She is alert and oriented to person, place, and time. Mental status is at baseline.  Psychiatric:        Mood and Affect: Mood normal.        Behavior:  Behavior normal.        Thought Content: Thought content normal.        Judgment: Judgment normal.     Results for orders placed or performed in visit on 12/29/22  CBC with Differential/Platelet  Result Value Ref Range   WBC 7.9 3.4 - 10.8 x10E3/uL   RBC 4.76 3.77 - 5.28 x10E6/uL   Hemoglobin 13.2 11.1 - 15.9 g/dL   Hematocrit 40.9 81.1 - 46.6 %   MCV 84 79 - 97 fL   MCH 27.7 26.6 - 33.0 pg   MCHC 32.9 31.5 - 35.7 g/dL   RDW 91.4 78.2 - 95.6 %   Platelets 288 150 - 450 x10E3/uL   Neutrophils 60 Not Estab. %   Lymphs 28 Not Estab. %   Monocytes 7 Not Estab. %   Eos 3 Not Estab. %   Basos 1 Not Estab. %   Neutrophils Absolute 4.7 1.4 - 7.0 x10E3/uL   Lymphocytes Absolute 2.2 0.7 - 3.1 x10E3/uL   Monocytes Absolute 0.5 0.1 - 0.9 x10E3/uL   EOS (ABSOLUTE) 0.3 0.0 - 0.4 x10E3/uL   Basophils Absolute 0.1 0.0 - 0.2 x10E3/uL   Immature Granulocytes 1 Not Estab. %   Immature Grans (Abs) 0.1 0.0 - 0.1 x10E3/uL  Comprehensive metabolic panel  Result Value Ref Range   Glucose 91 70 - 99 mg/dL   BUN 15 6 - 24 mg/dL   Creatinine, Ser 2.13 0.57 - 1.00 mg/dL   eGFR 086 >57 QI/ONG/2.95   BUN/Creatinine Ratio 20 9 - 23   Sodium 139 134 - 144 mmol/L   Potassium 4.6 3.5 - 5.2 mmol/L   Chloride 105 96 - 106 mmol/L   CO2 17 (L) 20 - 29 mmol/L   Calcium 9.7 8.7 - 10.2 mg/dL   Total Protein 6.6 6.0 - 8.5 g/dL   Albumin 4.4 3.9 - 4.9 g/dL   Globulin, Total 2.2 1.5 - 4.5 g/dL   Albumin/Globulin Ratio 2.0 1.2 - 2.2   Bilirubin Total 0.3 0.0 - 1.2 mg/dL   Alkaline Phosphatase 102 44 - 121 IU/L   AST 20 0 - 40 IU/L   ALT 19 0 - 32 IU/L  Lipid Panel w/o Chol/HDL Ratio  Result Value Ref Range   Cholesterol, Total 238 (H) 100 - 199 mg/dL   Triglycerides 284 0 - 149 mg/dL   HDL 65 >13 mg/dL   VLDL Cholesterol Cal 24 5 - 40 mg/dL   LDL Chol Calc (NIH) 244 (H) 0 - 99 mg/dL  Urinalysis, Routine w reflex microscopic  Result Value Ref Range   Specific Gravity, UA 1.020 1.005 - 1.030  pH, UA  5.5 5.0 - 7.5   Color, UA Yellow Yellow   Appearance Ur Clear Clear   Leukocytes,UA Negative Negative   Protein,UA Negative Negative/Trace   Glucose, UA Negative Negative   Ketones, UA Negative Negative   RBC, UA Negative Negative   Bilirubin, UA Negative Negative   Urobilinogen, Ur 0.2 0.2 - 1.0 mg/dL   Nitrite, UA Negative Negative   Microscopic Examination Comment   TSH  Result Value Ref Range   TSH 5.750 (H) 0.450 - 4.500 uIU/mL  VITAMIN D 25 Hydroxy (Vit-D Deficiency, Fractures)  Result Value Ref Range   Vit D, 25-Hydroxy 14.7 (L) 30.0 - 100.0 ng/mL  B12  Result Value Ref Range   Vitamin B-12 CANCELED pg/mL      Assessment & Plan:   Problem List Items Addressed This Visit       Endocrine   Hypothyroidism - Primary    Has been taking her thyroid regularly. Feeling hyperthyroid. Will cut her dose down significantly and recheck in 6 weeks. Call with any concerns. Continue to monitor.       Relevant Medications   levothyroxine (SYNTHROID) 25 MCG tablet     Follow up plan: Return in about 6 weeks (around 04/08/2023).

## 2023-03-19 ENCOUNTER — Other Ambulatory Visit: Payer: Self-pay | Admitting: Family Medicine

## 2023-03-22 NOTE — Telephone Encounter (Signed)
Requested Prescriptions  Pending Prescriptions Disp Refills   levothyroxine (SYNTHROID) 25 MCG tablet [Pharmacy Med Name: LEVOTHYROXINE 25 MCG TABLET] 90 tablet 1    Sig: TAKE 1 TABLET BY MOUTH EVERY DAY     Endocrinology:  Hypothyroid Agents Failed - 03/19/2023  2:30 PM      Failed - TSH in normal range and within 360 days    TSH  Date Value Ref Range Status  12/29/2022 5.750 (H) 0.450 - 4.500 uIU/mL Final         Passed - Valid encounter within last 12 months    Recent Outpatient Visits           3 weeks ago Acquired hypothyroidism   [facility] [place], Megan P, DO   2 months ago Routine general medical examination at a health care facility   [facility] [place] P, DO   4 months ago Acute non-recurrent maxillary sinusitis   [facility] [place], Megan P, DO   4 months ago Shortness of breath   [facility] Larae Grooms, NP   8 months ago Sore throat   [place] [place], Oswaldo Conroy, PA-C       Future Appointments             In 3 weeks Laural Benes, Oralia Rud, DO [place] [facility]   In 3 months Laural Benes, Oralia Rud, DO [place] [facility]

## 2023-04-12 ENCOUNTER — Ambulatory Visit: Payer: BC Managed Care – PPO | Admitting: Family Medicine

## 2023-04-30 ENCOUNTER — Ambulatory Visit (INDEPENDENT_AMBULATORY_CARE_PROVIDER_SITE_OTHER): Payer: BC Managed Care – PPO | Admitting: Family Medicine

## 2023-04-30 ENCOUNTER — Encounter: Payer: Self-pay | Admitting: Family Medicine

## 2023-04-30 VITALS — BP 97/67 | HR 60 | Temp 98.6°F | Wt 278.0 lb

## 2023-04-30 DIAGNOSIS — E039 Hypothyroidism, unspecified: Secondary | ICD-10-CM | POA: Diagnosis not present

## 2023-04-30 DIAGNOSIS — Z308 Encounter for other contraceptive management: Secondary | ICD-10-CM

## 2023-04-30 DIAGNOSIS — E782 Mixed hyperlipidemia: Secondary | ICD-10-CM | POA: Diagnosis not present

## 2023-04-30 DIAGNOSIS — L719 Rosacea, unspecified: Secondary | ICD-10-CM

## 2023-04-30 DIAGNOSIS — E559 Vitamin D deficiency, unspecified: Secondary | ICD-10-CM

## 2023-04-30 MED ORDER — METRONIDAZOLE 1 % EX GEL: Freq: Every day | CUTANEOUS | 3 refills | Status: DC

## 2023-04-30 MED ORDER — GABAPENTIN 100 MG PO CAPS: 100.0000 mg | ORAL_CAPSULE | Freq: Every day | ORAL | 1 refills | Status: DC

## 2023-04-30 NOTE — Assessment & Plan Note (Signed)
Likely in part due to uncontrolled hypothyroidism. Rechecking labs today. Await results. Treat as needed.

## 2023-04-30 NOTE — Assessment & Plan Note (Signed)
Will treat with metrogel. Call with any concerns. Continue to monitor.

## 2023-04-30 NOTE — Assessment & Plan Note (Signed)
Tolerating medicine well. Feeling better. Will recheck labs and treat as needed.

## 2023-04-30 NOTE — Assessment & Plan Note (Signed)
Rechecking labs today. Await results. Treat as needed.  °

## 2023-04-30 NOTE — Progress Notes (Signed)
BP 97/67   Pulse 60   Temp 98.6 F (37 C) (Oral)   Wt 278 lb (126.1 kg)   LMP 04/26/2023 (Approximate)   SpO2 98%   BMI 47.72 kg/m    Subjective:    Patient ID: Sharon Gibson, female    DOB: 14-Mar-1981, 42 y.o.   MRN: [id number]  HPI: Sharon Gibson is a 42 y.o. female  Chief Complaint  Patient presents with   Hypothyroidism   HYPOTHYROIDISM Thyroid control status:better Satisfied with current treatment? yes Medication side effects: no Medication compliance: good compliance Recent dose adjustment:yes Fatigue: no Cold intolerance: no Heat intolerance: no Weight gain: no Weight loss: no Constipation: no Diarrhea/loose stools: no Palpitations: no Lower extremity edema: no Anxiety/depressed mood: no  HYPERLIPIDEMIA Hyperlipidemia status: stable Satisfied with current treatment?  unsure Side effects:  no Medication compliance: N/A Past cholesterol meds: none Supplements: none Aspirin:  no The 10-year ASCVD risk score (Arnett DK, et al., 2019) is: 0.4%   Values used to calculate the score:     Age: 14 years     Sex: Female     Is Non-Hispanic African American: No     Diabetic: No     Tobacco smoker: No     Systolic Blood Pressure: 97 mmHg     Is BP treated: No     HDL Cholesterol: 65 mg/dL     Total Cholesterol: 238 mg/dL Chest pain:  no Coronary artery disease:  no  CONTRACEPTION CONCERNS Contraception: currently nothing Previous contraception: IUD for years- unable to get it in without sedation Sexual activity: not currently sexually active Gravida/Para: G0 Average interval between menses: regular Length of menses: 5 days Flow: moderate Dysmenorrhea: no  Relevant past medical, surgical, family and social history reviewed and updated as indicated. Interim medical history since our last visit reviewed. Allergies and medications reviewed and updated.  Review of Systems  Constitutional: Negative.   Respiratory: Negative.     Cardiovascular: Negative.   Gastrointestinal: Negative.   Musculoskeletal: Negative.   Neurological: Negative.   Psychiatric/Behavioral: Negative.      Per HPI unless specifically indicated above     Objective:   Today's Vitals   04/30/23 0943  BP: 97/67  Pulse: 60  Temp: 98.6 F (37 C)  TempSrc: Oral  SpO2: 98%  Weight: 278 lb (126.1 kg)  PainSc: 0-No pain   Body mass index is 47.72 kg/m.  Wt Readings from Last 3 Encounters:  04/30/23 278 lb (126.1 kg)  02/25/23 282 lb (127.9 kg)  12/29/22 284 lb 12.8 oz (129.2 kg)    Physical Exam Vitals and nursing note reviewed.  Constitutional:      General: She is not in acute distress.    Appearance: Normal appearance. She is not ill-appearing, toxic-appearing or diaphoretic.  HENT:     Head: Normocephalic and atraumatic.     Right Ear: External ear normal.     Left Ear: External ear normal.     Nose: Nose normal.     Mouth/Throat:     Mouth: Mucous membranes are moist.     Pharynx: Oropharynx is clear.  Eyes:     General: No scleral icterus.       Right eye: No discharge.        Left eye: No discharge.     Extraocular Movements: Extraocular movements intact.     Conjunctiva/sclera: Conjunctivae normal.     Pupils: Pupils are equal, round, and reactive to light.  Cardiovascular:  Rate and Rhythm: Normal rate and regular rhythm.     Pulses: Normal pulses.     Heart sounds: Normal heart sounds. No murmur heard.    No friction rub. No gallop.  Pulmonary:     Effort: Pulmonary effort is normal. No respiratory distress.     Breath sounds: Normal breath sounds. No stridor. No wheezing, rhonchi or rales.  Chest:     Chest wall: No tenderness.  Musculoskeletal:        General: Normal range of motion.     Cervical back: Normal range of motion and neck supple.  Skin:    General: Skin is warm and dry.     Capillary Refill: Capillary refill takes less than 2 seconds.     Coloration: Skin is not jaundiced or pale.      Findings: No bruising, erythema, lesion or rash.  Neurological:     General: No focal deficit present.     Mental Status: She is alert and oriented to person, place, and time. Mental status is at baseline.  Psychiatric:        Mood and Affect: Mood normal.        Behavior: Behavior normal.        Thought Content: Thought content normal.        Judgment: Judgment normal.     Results for orders placed or performed in visit on 12/29/22  CBC with Differential/Platelet  Result Value Ref Range   WBC 7.9 3.4 - 10.8 x10E3/uL   RBC 4.76 3.77 - 5.28 x10E6/uL   Hemoglobin 13.2 11.1 - 15.9 g/dL   Hematocrit 16.1 09.6 - 46.6 %   MCV 84 79 - 97 fL   MCH 27.7 26.6 - 33.0 pg   MCHC 32.9 31.5 - 35.7 g/dL   RDW 04.5 40.9 - 81.1 %   Platelets 288 150 - 450 x10E3/uL   Neutrophils 60 Not Estab. %   Lymphs 28 Not Estab. %   Monocytes 7 Not Estab. %   Eos 3 Not Estab. %   Basos 1 Not Estab. %   Neutrophils Absolute 4.7 1.4 - 7.0 x10E3/uL   Lymphocytes Absolute 2.2 0.7 - 3.1 x10E3/uL   Monocytes Absolute 0.5 0.1 - 0.9 x10E3/uL   EOS (ABSOLUTE) 0.3 0.0 - 0.4 x10E3/uL   Basophils Absolute 0.1 0.0 - 0.2 x10E3/uL   Immature Granulocytes 1 Not Estab. %   Immature Grans (Abs) 0.1 0.0 - 0.1 x10E3/uL  Comprehensive metabolic panel  Result Value Ref Range   Glucose 91 70 - 99 mg/dL   BUN 15 6 - 24 mg/dL   Creatinine, Ser 9.14 0.57 - 1.00 mg/dL   eGFR 782 >95 AO/ZHY/8.65   BUN/Creatinine Ratio 20 9 - 23   Sodium 139 134 - 144 mmol/L   Potassium 4.6 3.5 - 5.2 mmol/L   Chloride 105 96 - 106 mmol/L   CO2 17 (L) 20 - 29 mmol/L   Calcium 9.7 8.7 - 10.2 mg/dL   Total Protein 6.6 6.0 - 8.5 g/dL   Albumin 4.4 3.9 - 4.9 g/dL   Globulin, Total 2.2 1.5 - 4.5 g/dL   Albumin/Globulin Ratio 2.0 1.2 - 2.2   Bilirubin Total 0.3 0.0 - 1.2 mg/dL   Alkaline Phosphatase 102 44 - 121 IU/L   AST 20 0 - 40 IU/L   ALT 19 0 - 32 IU/L  Lipid Panel w/o Chol/HDL Ratio  Result Value Ref Range   Cholesterol, Total 238  (H) 100 - 199 mg/dL  Triglycerides 138 0 - 149 mg/dL   HDL 65 >27 mg/dL   VLDL Cholesterol Cal 24 5 - 40 mg/dL   LDL Chol Calc (NIH) 253 (H) 0 - 99 mg/dL  Urinalysis, Routine w reflex microscopic  Result Value Ref Range   Specific Gravity, UA 1.020 1.005 - 1.030   pH, UA 5.5 5.0 - 7.5   Color, UA Yellow Yellow   Appearance Ur Clear Clear   Leukocytes,UA Negative Negative   Protein,UA Negative Negative/Trace   Glucose, UA Negative Negative   Ketones, UA Negative Negative   RBC, UA Negative Negative   Bilirubin, UA Negative Negative   Urobilinogen, Ur 0.2 0.2 - 1.0 mg/dL   Nitrite, UA Negative Negative   Microscopic Examination Comment   TSH  Result Value Ref Range   TSH 5.750 (H) 0.450 - 4.500 uIU/mL  VITAMIN D 25 Hydroxy (Vit-D Deficiency, Fractures)  Result Value Ref Range   Vit D, 25-Hydroxy 14.7 (L) 30.0 - 100.0 ng/mL  B12  Result Value Ref Range   Vitamin B-12 CANCELED pg/mL      Assessment & Plan:   Problem List Items Addressed This Visit       Endocrine   Hypothyroidism - Primary    Tolerating medicine well. Feeling better. Will recheck labs and treat as needed.       Relevant Orders   Comprehensive metabolic panel   TSH     Other   Rosacea    Will treat with metrogel. Call with any concerns. Continue to monitor.       Relevant Orders   Comprehensive metabolic panel   Vitamin D deficiency    Rechecking labs today. Await results. Treat as needed.       Relevant Orders   VITAMIN D 25 Hydroxy (Vit-D Deficiency, Fractures)   Comprehensive metabolic panel   Mixed hyperlipidemia    Likely in part due to uncontrolled hypothyroidism. Rechecking labs today. Await results. Treat as needed.       Relevant Orders   Lipid Panel w/o Chol/HDL Ratio   Comprehensive metabolic panel   Other Visit Diagnoses     Encounter for other contraceptive management       Unable to tolerate IUD, GYN moved. Would like to do nexplanon. Will get her back when new physician  starts here.   Relevant Orders   LH   FSH   Estradiol        Follow up plan: Return for September or October with Dr. Evelene Croon for nexplanon, end of October with me.

## 2023-05-01 ENCOUNTER — Other Ambulatory Visit: Payer: Self-pay | Admitting: Family Medicine

## 2023-05-02 ENCOUNTER — Other Ambulatory Visit: Payer: Self-pay | Admitting: Family Medicine

## 2023-05-02 DIAGNOSIS — E039 Hypothyroidism, unspecified: Secondary | ICD-10-CM

## 2023-05-02 MED ORDER — VITAMIN D (ERGOCALCIFEROL) 1.25 MG (50000 UNIT) PO CAPS: 50000.0000 [IU] | ORAL_CAPSULE | ORAL | 1 refills | Status: DC

## 2023-05-02 MED ORDER — LEVOTHYROXINE SODIUM 50 MCG PO TABS: 50.0000 ug | ORAL_TABLET | Freq: Every day | ORAL | 1 refills | Status: DC

## 2023-05-03 NOTE — Progress Notes (Signed)
Attempted to call patient, LVM to call office back to get scheduled for a 6 week lab only follow up. Put in CRM.

## 2023-05-28 ENCOUNTER — Ambulatory Visit
Admission: RE | Admit: 2023-05-28 | Discharge: 2023-05-28 | Disposition: A | Discharge status: Home / Self Care | Payer: BC Managed Care – PPO | Source: Ambulatory Visit

## 2023-05-28 ENCOUNTER — Ambulatory Visit: Payer: Self-pay | Admitting: *Deleted

## 2023-05-28 VITALS — BP 120/56 | HR 59 | Temp 98.2°F | Resp 18 | Ht 63.0 in

## 2023-05-28 DIAGNOSIS — M5432 Sciatica, left side: Secondary | ICD-10-CM | POA: Diagnosis not present

## 2023-05-28 DIAGNOSIS — S29019A Strain of muscle and tendon of unspecified wall of thorax, initial encounter: Secondary | ICD-10-CM

## 2023-05-28 DIAGNOSIS — S39012A Strain of muscle, fascia and tendon of lower back, initial encounter: Secondary | ICD-10-CM | POA: Diagnosis not present

## 2023-05-28 MED ORDER — METHYLPREDNISOLONE SODIUM SUCC 40 MG IJ SOLR: 40.0000 mg | Freq: Once | INTRAMUSCULAR | Status: DC

## 2023-05-28 MED ORDER — KETOROLAC TROMETHAMINE 30 MG/ML IJ SOLN
30.0000 mg | Freq: Once | INTRAMUSCULAR | Status: AC
  Administered 2023-05-28: 30 mg via INTRAMUSCULAR

## 2023-05-28 MED ORDER — NAPROXEN SODIUM 550 MG PO TABS: 550.0000 mg | ORAL_TABLET | Freq: Two times a day (BID) | ORAL | 0 refills | Status: AC

## 2023-05-28 MED ORDER — TIZANIDINE HCL 4 MG PO TABS: 4.0000 mg | ORAL_TABLET | Freq: Four times a day (QID) | ORAL | 0 refills | Status: DC | PRN

## 2023-05-28 MED ORDER — TIZANIDINE HCL 4 MG PO TABS: 4.0000 mg | ORAL_TABLET | Freq: Three times a day (TID) | ORAL | 0 refills | Status: AC | PRN

## 2023-05-28 MED ORDER — METHYLPREDNISOLONE ACETATE 40 MG/ML IJ SUSP
40.0000 mg | Freq: Once | INTRAMUSCULAR | Status: AC
  Administered 2023-05-28: 40 mg via INTRAMUSCULAR

## 2023-05-28 NOTE — Telephone Encounter (Signed)
Reason for Disposition  [1] MODERATE back pain (e.g., interferes with normal activities) AND [2] present > 3 days  Answer Assessment - Initial Assessment Questions 1. ONSET: "When did the pain begin?"      I'm having back pain.   It's been going on for a week.  I do exercises 6 times a week.   I'm sure I've done something.    It's from my mid low back and down into my hamstrings.   It's a constant pain and when I move it is more sharp pains. 2. LOCATION: "Where does it hurt?" (upper, mid or lower back)     See above 3. SEVERITY: "How bad is the pain?"  (e.g., Scale 1-10; mild, moderate, or severe)   - MILD (1-3): Doesn't interfere with normal activities.    - MODERATE (4-7): Interferes with normal activities or awakens from sleep.    - SEVERE (8-10): Excruciating pain, unable to do any normal activities.      Severe 4. PATTERN: "Is the pain constant?" (e.g., yes, no; constant, intermittent)      Constant with intermittent sharp pains.   Ibuprofen did not help the pain 800 mg.    Heat seems to help.     Sitting makes it worse. 5. RADIATION: "Does the pain shoot into your legs or somewhere else?"     Yes into my hamstrings mostly left side is affected.   It's in my hip too. 6. CAUSE:  "What do you think is causing the back pain?"      Working out 7. BACK OVERUSE:  "Any recent lifting of heavy objects, strenuous work or exercise?"     Working out 8. MEDICINES: "What have you taken so far for the pain?" (e.g., nothing, acetaminophen, NSAIDS)     Ibuprofen 800 mg did not help 9. NEUROLOGIC SYMPTOMS: "Do you have any weakness, numbness, or problems with bowel/bladder control?"     No 10. OTHER SYMPTOMS: "Do you have any other symptoms?" (e.g., fever, abdomen pain, burning with urination, blood in urine)       Not asked 11. PREGNANCY: "Is there any chance you are pregnant?" "When was your last menstrual period?"       Not asked  Protocols used: Back Pain-A-AH

## 2023-05-28 NOTE — Telephone Encounter (Signed)
Noted  

## 2023-05-28 NOTE — Discharge Instructions (Addendum)
Start Anaprox with Tylenol 500 mg tomorrow. Muscle relaxant, use with caution, as may cause drowsiness. Continue exercise and stretching as tolerated Consider ice heat rotation for 20 minutes on and 20 minutes off 3 times a day. Consider using over-the-counter Salonpas patches or other patches of your preference.  Follow-up with PCP if symptoms do not improve.

## 2023-05-28 NOTE — ED Provider Notes (Signed)
[facility]    CSN: [id number] Arrival date & time: 05/28/23  1627      History   Chief Complaint Chief Complaint  Patient presents with   Back Pain    I do barre/pilates 5-6x/week. I have a physical therapy intake for next Friday but my doctor was unable to see me and recommended Urgent Care for assessment & pain. It extends from mid/low back, through glutes, around hip, & into hamstring, left side - Entered by patient    HPI Kenyetta Pert is a 42 y.o. female.   The history is provided by the patient.  Back Pain Location:  Thoracic spine and lumbar spine Quality:  Stabbing, shooting and aching Radiates to:  L posterior upper leg and L knee Pain severity:  Moderate Pain is:  Same all the time Onset quality:  Gradual Duration:  2 weeks Timing:  Constant Progression:  Worsening Chronicity:  New Relieved by:  Nothing Worsened by:  Sitting and standing Ineffective treatments:  Ibuprofen Associated symptoms: leg pain     Past Medical History:  Diagnosis Date   Anemia    Anxiety    Chicken pox    Depression    Eating disorder    Has had residential treatment and hospitalization previously.   Heart murmur    as a child   History of anemia    RECIEVES IRON INFUSIONS YEARLY   History of pulmonary embolus (PE) 2002   bilaterally-followed by [facility] hematology-w/u negative per pt   Hypothyroidism    no meds currently   PTSD (post-traumatic stress disorder)     Patient Active Problem List   Diagnosis Date Noted   Rosacea 04/30/2023   Vitamin D deficiency 04/30/2023   Mixed hyperlipidemia 04/30/2023   Meniere disease, bilateral 12/29/2022   Right foot pain 06/15/2022   Vasomotor rhinitis 02/12/2022   Idiopathic urticaria 02/12/2022   Chronic allergic conjunctivitis 02/12/2022   At risk for prolonged QT interval syndrome 02/12/2022   Lumbar radiculopathy 01/23/2022   Endometriosis determined by laparoscopy 06/13/2018   Ovarian cyst 03/01/2018    Hx pulmonary embolism 02/03/2018   Generalized anxiety disorder 10/12/2016   Right knee pain 09/02/2016   Severe episode of recurrent major depressive disorder, without psychotic features (HCC) 07/23/2016   Bulimia nervosa, in partial remission, mild 07/23/2016    Class: Chronic   Iron deficiency anemia 04/08/2016   B12 deficiency 04/08/2016   S/P gastric bypass 03/20/2016   History of eating disorder 03/20/2016   Insomnia 03/20/2016   PTSD (post-traumatic stress disorder) 03/20/2016   Hypothyroidism 03/20/2016   PCOS (polycystic ovarian syndrome) 12/14/2014    Past Surgical History:  Procedure Laterality Date   CHOLECYSTECTOMY  2003   EXCISION OF ENDOMETRIOMA     GASTRIC BYPASS  2003   LAPAROSCOPIC OVARIAN CYSTECTOMY Left 05/10/2018   Procedure: LAPAROSCOPIC OVARIAN CYSTECTOMY;  Surgeon: Natale Milch, MD;  Location: [facility] ORS;  Service: Gynecology;  Laterality: Left;    OB History     Gravida  0   Para  0   Term  0   Preterm  0   AB  0   Living  0      SAB  0   IAB  0   Ectopic  0   Multiple  0   Live Births  0            Home Medications    Prior to Admission medications   Medication Sig Start Date End Date Taking?  Authorizing Provider  naproxen sodium (ANAPROX DS) 550 MG tablet Take 1 tablet (550 mg total) by mouth 2 (two) times daily with a meal for 10 days. 05/28/23 06/07/23 Yes Alizabeth Antonio, Linde Gillis, NP  Cyanocobalamin (VITAMIN B-12) 2500 MCG SUBL Place 1 tablet (2,500 mcg total) under the tongue daily at 2 PM. Patient not taking: Reported on 04/30/2023 12/22/22   Rickard Patience, MD  gabapentin (NEURONTIN) 100 MG capsule Take 1 capsule (100 mg total) by mouth at bedtime. 04/30/23   Johnson, Megan P, DO  hydrOXYzine (ATARAX) 25 MG tablet Take 2 tablets (50 mg total) by mouth daily. At night Patient not taking: Reported on 04/30/2023 12/29/22   Olevia Perches P, DO  levothyroxine (SYNTHROID) 50 MCG tablet Take 1 tablet (50 mcg total) by mouth daily. 05/02/23    Johnson, Megan P, DO  metroNIDAZOLE (METROGEL) 1 % gel Apply topically daily. 04/30/23   Johnson, Megan P, DO  sertraline (ZOLOFT) 100 MG tablet Take 2.5 tablets (250 mg total) by mouth as directed. 12/29/22   Johnson, Megan P, DO  tiZANidine (ZANAFLEX) 4 MG tablet Take 1 tablet (4 mg total) by mouth 3 (three) times daily as needed for up to 5 days for muscle spasms. 05/28/23 06/02/23  Anthonio Mizzell, Linde Gillis, NP  Vitamin D, Ergocalciferol, (DRISDOL) 1.25 MG (50000 UNIT) CAPS capsule Take 1 capsule (50,000 Units total) by mouth every 7 (seven) days. 05/02/23   Dorcas Carrow, DO    Family History Family History  Problem Relation Age of Onset   Depression Father    Diabetes Father    Anxiety disorder Father    Alzheimer's disease Maternal Grandfather    Alzheimer's disease Maternal Grandmother    Prostate cancer Paternal Grandfather    Lung cancer Paternal Grandmother     Social History Social History   Tobacco Use   Smoking status: Never   Smokeless tobacco: Never  Vaping Use   Vaping status: Never Used  Substance Use Topics   Alcohol use: Not Currently    Alcohol/week: 0.0 - 1.0 standard drinks of alcohol    Comment: rare   Drug use: No     Allergies   Penicillins, Strawberry (diagnostic), Other, Nsaids, and Azithromycin   Review of Systems Review of Systems  Musculoskeletal:  Positive for back pain.     Physical Exam Triage Vital Signs ED Triage Vitals  Encounter Vitals Group     BP 05/28/23 1647 (!) 120/56     Systolic BP Percentile --      Diastolic BP Percentile --      Pulse Rate 05/28/23 1644 (!) 59     Resp 05/28/23 1644 18     Temp 05/28/23 1644 98.2 F (36.8 C)     Temp Source 05/28/23 1644 Oral     SpO2 05/28/23 1644 100 %     Weight --      Height 05/28/23 1643 5\' 3"  (1.6 m)     Head Circumference --      Peak Flow --      Pain Score 05/28/23 1642 8     Pain Loc --      Pain Education --      Exclude from Growth Chart --    No data found.  Updated  Vital Signs BP (!) 120/56 (BP Location: Right Arm)   Pulse (!) 59   Temp 98.2 F (36.8 C) (Oral)   Resp 18   Ht 5\' 3"  (1.6 m)   LMP 05/24/2023 (Approximate)  SpO2 100%   BMI 49.25 kg/m   Visual Acuity Right Eye Distance:   Left Eye Distance:   Bilateral Distance:    Right Eye Near:   Left Eye Near:    Bilateral Near:     Physical Exam Vitals and nursing note reviewed.  Constitutional:      General: She is in acute distress.  Musculoskeletal:        General: Tenderness present.     Comments: Patient is reporting tenderness on palpation to the left of mid spine in the thoracic and lumbar area.  Neurological:     Mental Status: She is alert.      UC Treatments / Results  Labs (all labs ordered are listed, but only abnormal results are displayed) Labs Reviewed - No data to display  EKG   Radiology No results found.  Procedures Procedures (including critical care time)  Medications Ordered in UC Medications  ketorolac (TORADOL) 30 MG/ML injection 30 mg (30 mg Intramuscular Given 05/28/23 1743)  methylPREDNISolone acetate (DEPO-MEDROL) injection 40 mg (40 mg Intramuscular Given 05/28/23 1743)    Initial Impression / Assessment and Plan / UC Course  I have reviewed the triage vital signs and the nursing notes.  Pertinent labs & imaging results that were available during my care of the patient were reviewed by me and considered in my medical decision making (see chart for details).     Patient has had gradual worsening of midthoracic and lower lumbar pain with sciatic involvement down left leg.  She denies any trauma.  She states that the pain is worse with long periods of standing or sitting.  She has been continuing her Pilates and yoga as tolerated which has been helping to stretch the area.  She reports the pain to currently be at 7 out of 10.  She reports using 800 mg of ibuprofen last night without effectiveness.  She has not tried any ice or heat or topical  ointments as of this time.  Thoracic and lumbar strain of unknown cause.  Will encourage ice and heat 20 minutes on 20 minutes off.  She is to continue her Pilates class as tolerated.  Encouraged to seek massage therapy.  We will order tizanidine for the muscle spasms.  She is willing to take NSAIDs at this time, she does normally avoid them due to history of bariatric surgeries.  We have discussed safety of taking this medication with food. She is to follow-up with her PCP for worsening symptoms or see [place] in urgent care. Final Clinical Impressions(s) / UC Diagnoses   Final diagnoses:  Strain of lumbar region, initial encounter  Sciatica of left side  Strain of thoracic region, initial encounter     Discharge Instructions      Start Anaprox with Tylenol 500 mg tomorrow. Muscle relaxant, use with caution, as may cause drowsiness. Continue exercise and stretching as tolerated Consider ice heat rotation for 20 minutes on and 20 minutes off 3 times a day. Consider using over-the-counter Salonpas patches or other patches of your preference.  Follow-up with PCP if symptoms do not improve.     ED Prescriptions     Medication Sig Dispense Auth. Provider   tiZANidine (ZANAFLEX) 4 MG tablet  (Status: Discontinued) Take 1 tablet (4 mg total) by mouth every 6 (six) hours as needed for up to 5 days for muscle spasms. 20 tablet [place] Haydon, Linde Gillis, NP   naproxen sodium (ANAPROX DS) 550 MG tablet Take 1 tablet (550 mg  total) by mouth 2 (two) times daily with a meal for 10 days. 20 tablet Nyonna Hargrove, Linde Gillis, NP   tiZANidine (ZANAFLEX) 4 MG tablet Take 1 tablet (4 mg total) by mouth 3 (three) times daily as needed for up to 5 days for muscle spasms. 15 tablet Chelse Matas, Linde Gillis, NP      PDMP not reviewed this encounter.   Nelda Marseille, NP 05/28/23 [phone]

## 2023-05-28 NOTE — ED Triage Notes (Signed)
Patient presents with mid back and travel down left side area into hips x a few weeks. Treated with massage and 800 mg of Ibuprofen w/o relief.

## 2023-05-28 NOTE — Telephone Encounter (Signed)
  Chief Complaint: back pain from working out, pulled a muscle, she thinks Symptoms: pain down hamstrings, lower back and left hip. Frequency: For the past week Pertinent Negatives: Patient denies injuries but believes she pulled a muscle while working out. Disposition: [] ED /[x] Urgent Care (no appt availability in office) / [] Appointment(In office/virtual)/ []  [facility]/ [] Home Care/ [] Refused Recommended Disposition /[] [place] [facility]/ []  Follow-up with PCP Additional Notes: There are no appts available with [facility] with any of the providers until Sept 30.    Pt. York Spaniel she would go on to the urgent care today.

## 2023-05-31 ENCOUNTER — Other Ambulatory Visit: Payer: Self-pay | Admitting: Family Medicine

## 2023-06-02 NOTE — Telephone Encounter (Signed)
Requested medication (s) are due for refill today: Yes  Requested medication (s) are on the active medication list: Yes  Last refill:  05/02/23  Future visit scheduled: Yes  Notes to clinic:  Pharmacy requesting 90 day supply      Requested Prescriptions  Pending Prescriptions Disp Refills   levothyroxine (SYNTHROID) 50 MCG tablet [Pharmacy Med Name: LEVOTHYROXINE 50 MCG TABLET] 90 tablet 1    Sig: TAKE 1 TABLET BY MOUTH EVERY DAY     Endocrinology:  Hypothyroid Agents Failed - 05/31/2023 12:32 PM      Failed - TSH in normal range and within 360 days    TSH  Date Value Ref Range Status  04/30/2023 5.690 (H) 0.450 - 4.500 uIU/mL Final         Passed - Valid encounter within last 12 months    Recent Outpatient Visits           1 month ago Acquired hypothyroidism   [place] [facility] [place], Megan P, DO   3 months ago Acquired hypothyroidism   [place] [facility] [place], Megan P, DO   5 months ago Routine general medical examination at a health care facility   [facility], [place] P, DO   6 months ago Acute non-recurrent maxillary sinusitis   [place] [facility] [place], Megan P, DO   7 months ago Shortness of breath   [place] [facility] Larae Grooms, NP       Future Appointments             In 3 weeks Laural Benes, Oralia Rud, DO [facility]

## 2023-06-07 ENCOUNTER — Telehealth: Payer: Self-pay | Admitting: Family Medicine

## 2023-06-07 ENCOUNTER — Telehealth (HOSPITAL_COMMUNITY): Payer: Self-pay | Admitting: Professional

## 2023-06-07 ENCOUNTER — Ambulatory Visit: Payer: Self-pay

## 2023-06-07 ENCOUNTER — Encounter: Payer: Self-pay | Admitting: Family Medicine

## 2023-06-07 ENCOUNTER — Ambulatory Visit: Payer: BC Managed Care – PPO | Admitting: Family Medicine

## 2023-06-07 ENCOUNTER — Telehealth (HOSPITAL_COMMUNITY): Payer: Self-pay | Admitting: Psychiatry

## 2023-06-07 ENCOUNTER — Other Ambulatory Visit: Payer: Self-pay | Admitting: Family Medicine

## 2023-06-07 VITALS — BP 136/71 | HR 71 | Temp 98.3°F

## 2023-06-07 DIAGNOSIS — R0789 Other chest pain: Secondary | ICD-10-CM

## 2023-06-07 DIAGNOSIS — F332 Major depressive disorder, recurrent severe without psychotic features: Secondary | ICD-10-CM

## 2023-06-07 MED ORDER — QUETIAPINE FUMARATE 25 MG PO TABS
25.0000 mg | ORAL_TABLET | Freq: Every day | ORAL | 1 refills | Status: DC
Start: 2023-06-07 — End: 2023-06-22

## 2023-06-07 MED ORDER — TIZANIDINE HCL 4 MG PO TABS: 4.0000 mg | ORAL_TABLET | Freq: Every day | ORAL | 1 refills | Status: DC

## 2023-06-07 NOTE — Progress Notes (Signed)
BP 136/71   Pulse 71   Temp 98.3 F (36.8 C) (Oral)   LMP 05/24/2023 (Approximate)   SpO2 98%    Subjective:    Patient ID: Sharon Gibson, female    DOB: 12/13/1980, 42 y.o.   MRN: [id number]  HPI: Sharon Gibson is a 42 y.o. female  Chief Complaint  Patient presents with   Anxiety   Depression   ANXIETY/DEPRESSION Duration: chronic, but significantly worse in the past 3 weeks.  Anxious mood: yes  Excessive worrying: yes Irritability: yes  Sweating: no Nausea: yes Palpitations:yes Hyperventilation: yes Panic attacks: yes Agoraphobia: yes  Obscessions/compulsions: yes Depressed mood: yes    06/07/2023   10:31 AM 04/30/2023    9:48 AM 02/25/2023    2:22 PM 12/29/2022   10:28 AM 11/20/2022    8:14 AM  Depression screen PHQ 2/9  Decreased Interest 3 0 1 0 1  Down, Depressed, Hopeless 3 0 1 2 3   PHQ - 2 Score 6 0 2 2 4   Altered sleeping 3 3 1 3 1   Tired, decreased energy 3 1 2 3 3   Change in appetite 3 1 2 3 3   Feeling bad or failure about yourself  3 0 1 3 3   Trouble concentrating 2 0 3 2 0  Moving slowly or fidgety/restless 3 0 2 0 0  Suicidal thoughts  0 0 1 0  PHQ-9 Score 23 5 13 17 14   Difficult doing work/chores Extremely dIfficult Not difficult at all Somewhat difficult Somewhat difficult Very difficult   Anhedonia: no Weight changes: no Insomnia: yes   Hypersomnia: no Fatigue/loss of energy: yes Feelings of worthlessness: yes Feelings of guilt: yes Impaired concentration/indecisiveness: yes Suicidal ideations: yes  Crying spells: yes Recent Stressors/Life Changes: yes   Relationship problems: no   Family stress: yes     Financial stress: yes    Job stress: yes    Recent death/loss: no  Relevant past medical, surgical, family and social history reviewed and updated as indicated. Interim medical history since our last visit reviewed. Allergies and medications reviewed and updated.  Review of Systems  Constitutional: Negative.    Respiratory: Negative.    Cardiovascular: Negative.   Gastrointestinal: Negative.   Musculoskeletal:  Positive for back pain and myalgias. Negative for arthralgias, gait problem, joint swelling, neck pain and neck stiffness.  Psychiatric/Behavioral: Negative.      Per HPI unless specifically indicated above     Objective:    BP 136/71   Pulse 71   Temp 98.3 F (36.8 C) (Oral)   LMP 05/24/2023 (Approximate)   SpO2 98%   Wt Readings from Last 3 Encounters:  04/30/23 278 lb (126.1 kg)  02/25/23 282 lb (127.9 kg)  12/29/22 284 lb 12.8 oz (129.2 kg)    Physical Exam Vitals and nursing note reviewed.  Constitutional:      General: She is not in acute distress.    Appearance: Normal appearance. She is not ill-appearing, toxic-appearing or diaphoretic.  HENT:     Head: Normocephalic and atraumatic.     Right Ear: External ear normal.     Left Ear: External ear normal.     Nose: Nose normal.     Mouth/Throat:     Mouth: Mucous membranes are moist.     Pharynx: Oropharynx is clear.  Eyes:     General: No scleral icterus.       Right eye: No discharge.        Left eye: No discharge.  Extraocular Movements: Extraocular movements intact.     Conjunctiva/sclera: Conjunctivae normal.     Pupils: Pupils are equal, round, and reactive to light.  Cardiovascular:     Rate and Rhythm: Normal rate. Rhythm irregular.     Pulses: Normal pulses.     Heart sounds: Normal heart sounds. No murmur heard.    No friction rub. No gallop.  Pulmonary:     Effort: Pulmonary effort is normal. No respiratory distress.     Breath sounds: Normal breath sounds. No stridor. No wheezing, rhonchi or rales.  Chest:     Chest wall: No tenderness.  Musculoskeletal:        General: Normal range of motion.     Cervical back: Normal range of motion and neck supple.  Skin:    General: Skin is warm and dry.     Capillary Refill: Capillary refill takes less than 2 seconds.     Coloration: Skin is not  jaundiced or pale.     Findings: No bruising, erythema, lesion or rash.  Neurological:     General: No focal deficit present.     Mental Status: She is alert and oriented to person, place, and time. Mental status is at baseline.  Psychiatric:        Mood and Affect: Mood normal. Affect is tearful.        Behavior: Behavior normal.        Thought Content: Thought content normal.        Judgment: Judgment normal.     Results for orders placed or performed in visit on 04/30/23  VITAMIN D 25 Hydroxy (Vit-D Deficiency, Fractures)  Result Value Ref Range   Vit D, 25-Hydroxy 15.7 (L) 30.0 - 100.0 ng/mL  Lipid Panel w/o Chol/HDL Ratio  Result Value Ref Range   Cholesterol, Total 197 100 - 199 mg/dL   Triglycerides 85 0 - 149 mg/dL   HDL 68 >93 mg/dL   VLDL Cholesterol Cal 15 5 - 40 mg/dL   LDL Chol Calc (NIH) 235 (H) 0 - 99 mg/dL  Comprehensive metabolic panel  Result Value Ref Range   Glucose 85 70 - 99 mg/dL   BUN 14 6 - 24 mg/dL   Creatinine, Ser 5.73 0.57 - 1.00 mg/dL   eGFR 95 >22 GU/RKY/7.06   BUN/Creatinine Ratio 18 9 - 23   Sodium 141 134 - 144 mmol/L   Potassium 3.9 3.5 - 5.2 mmol/L   Chloride 103 96 - 106 mmol/L   CO2 22 20 - 29 mmol/L   Calcium 9.2 8.7 - 10.2 mg/dL   Total Protein 6.6 6.0 - 8.5 g/dL   Albumin 4.3 3.9 - 4.9 g/dL   Globulin, Total 2.3 1.5 - 4.5 g/dL   Bilirubin Total 0.4 0.0 - 1.2 mg/dL   Alkaline Phosphatase 89 44 - 121 IU/L   AST 18 0 - 40 IU/L   ALT 17 0 - 32 IU/L  TSH  Result Value Ref Range   TSH 5.690 (H) 0.450 - 4.500 uIU/mL  LH  Result Value Ref Range   LH 6.5 mIU/mL  FSH  Result Value Ref Range   FSH 5.6 mIU/mL  Estradiol  Result Value Ref Range   Estradiol 308.0 pg/mL      Assessment & Plan:   Problem List Items Addressed This Visit       Other   Severe episode of recurrent major depressive disorder, without psychotic features (HCC)    Not doing well. Doesn't feel like she  needs to be hospitalized at this time, but has a plan  to get there if things change. Will continue sertraline and start seroquel. Recheck 1 week. Call with any changes. Continue to monitor closely.      Other Visit Diagnoses     Chest discomfort    -  Primary   EKG normal. Will check back in next week- likely due to anxiety.   Relevant Orders   EKG 12-Lead (Completed)        Follow up plan: Return in about 1 week (around 06/14/2023).

## 2023-06-07 NOTE — Telephone Encounter (Signed)
Patient dropped off FMLA forms for completion by Dr. Laural Benes. I am placing forms in providers folder. Informed patient to allow 48-72 hours for provider to complete. Patient request call when done. [phone]

## 2023-06-07 NOTE — Progress Notes (Signed)
Interpreted by me today. NSR at 64 bpm. No ST segment changes.

## 2023-06-07 NOTE — Assessment & Plan Note (Signed)
Not doing well. Doesn't feel like she needs to be hospitalized at this time, but has a plan to get there if things change. Will continue sertraline and start seroquel. Recheck 1 week. Call with any changes. Continue to monitor closely.

## 2023-06-07 NOTE — Telephone Encounter (Signed)
Message from Zarephath C sent at 06/07/2023  8:49 AM EDT  Summary: behavioral health concern / contact req   The patient would like to speak with a member of clinical staff when possible regarding their behavioral health  The patient shares that they have a history of behavioral health concerns and would like to speak with a member of staff when possible  The patient was not in crisis at the time of call but experiencing significant stress  Please contact when possible         Chief Complaint: depression/anxiety "passive thoughts of suicide" Symptoms: thinking about dealth, denied plan and promised safety and to cal 911 if has active thoughts and/or plan, difficulty with focus and doing regular activities, "emotional dysregulation" tearful during call, difficulty falling or staying asleep  Frequency: 3 weeks  Pertinent Negatives: Patient denies suicidal plan or thoughts of suicide Disposition: [] ED /[] Urgent Care (no appt availability in office) / [x] Appointment(In office/virtual)/ []  [facility]/ [] Home Care/ [] Refused Recommended Disposition /[] [place] [facility]/ []  Follow-up with PCP Additional Notes: in office appt with PCP this am   Reason for Disposition  Patient sounds very upset or troubled to the triager  Answer Assessment - Initial Assessment Questions 1. CONCERN: "Did anything happen that prompted you to call today?"      Anxiety attacks difficult to leave house 2. ANXIETY SYMPTOMS: "Can you describe how you (your loved one; patient) have been feeling?" (e.g., tense, restless, panicky, anxious, keyed up, overwhelmed, sense of impending doom).      Difficulty focus and doing everyday tasks 3. ONSET: "How long have you been feeling this way?" (e.g., hours, days, weeks)     3 weeks maybe longer  4. SEVERITY: "How would you rate the level of anxiety?" (e.g., 0 - 10; or mild, moderate, severe).     9/10  5. FUNCTIONAL IMPAIRMENT: "How have these feelings affected  your ability to do daily activities?" "Have you had more difficulty than usual doing your normal daily activities?" (e.g., getting better, same, worse; self-care, school, work, interactions)     Have you had more difficulty than usual 6. HISTORY: "Have you felt this way before?" "Have you ever been diagnosed with an anxiety problem in the past?" (e.g., generalized anxiety disorder, panic attacks, PTSD). If Yes, ask: "How was this problem treated?" (e.g., medicines, counseling, etc.)     Yes yes 7. RISK OF HARM - SUICIDAL IDEATION: "Do you ever have thoughts of hurting or killing yourself?" If Yes, ask:  "Do you have these feelings now?" "Do you have a plan on how you would do this?"     Passive suicidal ideation thinking about death not being no plan  8. TREATMENT:  "What has been done so far to treat this anxiety?" (e.g., medicines, relaxation strategies). "What has helped?"     Pilates- got hurt and not able to work out enough  9. TREATMENT - THERAPIST: "Do you have a counselor or therapist? Name?"     Yeswill at 2300 today  10. POTENTIAL TRIGGERS: "Do you drink caffeinated beverages (e.g., coffee, colas, teas), and how much daily?" "Do you drink alcohol or use any drugs?" "Have you started any new medicines recently?"       World events/job stress 11. PATIENT SUPPORT: "Who is with you now?" "Who do you live with?" "Do you have family or friends who you can talk to?"        Parents- support and helpful and understanding 12. OTHER SYMPTOMS: "  Do you have any other symptoms?" (e.g., feeling depressed, trouble concentrating, trouble sleeping, trouble breathing, palpitations or fast heartbeat, chest pain, sweating, nausea, or diarrhea)       Waking up and trouble falling asleep,fatigue, "no emotion regulation" 13. PREGNANCY: "Is there any chance you are pregnant?" "When was your last menstrual period?"       N/a  Protocols used: Anxiety and Panic Attack-A-AH

## 2023-06-09 NOTE — Telephone Encounter (Signed)
Requested Prescriptions  Pending Prescriptions Disp Refills   levothyroxine (SYNTHROID) 50 MCG tablet [Pharmacy Med Name: LEVOTHYROXINE 50 MCG TABLET] 90 tablet 0    Sig: TAKE 1 TABLET BY MOUTH EVERY DAY     Endocrinology:  Hypothyroid Agents Failed - 06/07/2023  7:04 PM      Failed - TSH in normal range and within 360 days    TSH  Date Value Ref Range Status  04/30/2023 5.690 (H) 0.450 - 4.500 uIU/mL Final         Passed - Valid encounter within last 12 months    Recent Outpatient Visits           2 days ago Chest discomfort   [place] [facility] [place], Megan P, DO   1 month ago Acquired hypothyroidism   [place] [facility] [place], Megan P, DO   3 months ago Acquired hypothyroidism   [place] [facility] [place], Megan P, DO   5 months ago Routine general medical examination at a health care facility   [facility] [place], [place] P, DO   6 months ago Acute non-recurrent maxillary sinusitis   [place] [facility] Dorcas Carrow, DO       Future Appointments             In 6 days Dorcas Carrow, DO [place] [facility]   In 2 weeks Laural Benes, Oralia Rud, DO [place] [facility], [facility]

## 2023-06-15 ENCOUNTER — Encounter: Payer: Self-pay | Admitting: Family Medicine

## 2023-06-15 ENCOUNTER — Ambulatory Visit: Payer: BC Managed Care – PPO | Admitting: Family Medicine

## 2023-06-15 VITALS — BP 108/73 | HR 72 | Temp 98.7°F

## 2023-06-15 DIAGNOSIS — F332 Major depressive disorder, recurrent severe without psychotic features: Secondary | ICD-10-CM

## 2023-06-15 NOTE — Progress Notes (Unsigned)
BP 108/73   Pulse 72   Temp 98.7 F (37.1 C) (Oral)   LMP 05/24/2023 (Approximate)   SpO2 99%    Subjective:    Patient ID: Sharon Gibson, female    DOB: 1980/12/14, 42 y.o.   MRN: [id number]  HPI: Sharon Gibson is a 42 y.o. female  Chief Complaint  Patient presents with  . Depression    Pt states things has been going okay for her since last week    To start IOP- likely doing [facility], which is virtual.   DEPRESSION Mood status: {Blank single:19197::"controlled","uncontrolled","better","worse","exacerbated","stable"} Satisfied with current treatment?: {Blank single:19197::"yes","no"} Symptom severity: {Blank single:19197::"mild","moderate","severe"}  Duration of current treatment : {Blank single:19197::"chronic","months","years"} Side effects: {Blank single:19197::"yes","no"} Medication compliance: {Blank single:19197::"excellent compliance","good compliance","fair compliance","poor compliance"} Psychotherapy/counseling: {Blank single:19197::"yes","no"} {Blank single:19197::"current","in the past"} Previous psychiatric medications: {Blank multiple:19196::"abilify","amitryptiline","buspar","celexa","cymbalta","depakote","effexor","lamictal","lexapro","lithium","nortryptiline","paxil","prozac","pristiq (desvenlafaxine","seroquel","wellbutrin","zoloft","zyprexa"} Depressed mood: {Blank single:19197::"yes","no"} Anxious mood: {Blank single:19197::"yes","no"} Anhedonia: {Blank single:19197::"yes","no"} Significant weight loss or gain: {Blank single:19197::"yes","no"} Insomnia: {Blank single:19197::"yes","no"} {Blank single:19197::"hard to fall asleep","hard to stay asleep"} Fatigue: {Blank single:19197::"yes","no"} Feelings of worthlessness or guilt: {Blank single:19197::"yes","no"} Impaired concentration/indecisiveness: {Blank single:19197::"yes","no"} Suicidal ideations: {Blank single:19197::"yes","no"} Hopelessness: {Blank single:19197::"yes","no"} Crying spells: {Blank  single:19197::"yes","no"}    06/15/2023    4:20 PM 06/07/2023   10:31 AM 04/30/2023    9:48 AM 02/25/2023    2:22 PM 12/29/2022   10:28 AM  Depression screen PHQ 2/9  Decreased Interest 3 3 0 1 0  Down, Depressed, Hopeless 3 3 0 1 2  PHQ - 2 Score 6 6 0 2 2  Altered sleeping 1 3 3 1 3   Tired, decreased energy 2 3 1 2 3   Change in appetite 3 3 1 2 3   Feeling bad or failure about yourself  3 3 0 1 3  Trouble concentrating 3 2 0 3 2  Moving slowly or fidgety/restless 0 3 0 2 0  Suicidal thoughts 1  0 0 1  PHQ-9 Score 19 23 5 13 17   Difficult doing work/chores Extremely dIfficult Extremely dIfficult Not difficult at all Somewhat difficult Somewhat difficult     Relevant past medical, surgical, family and social history reviewed and updated as indicated. Interim medical history since our last visit reviewed. Allergies and medications reviewed and updated.  Review of Systems  Constitutional: Negative.   Respiratory: Negative.    Cardiovascular: Negative.   Musculoskeletal: Negative.   Psychiatric/Behavioral:  Positive for dysphoric mood. Negative for agitation, behavioral problems, confusion, decreased concentration, hallucinations, self-injury, sleep disturbance and suicidal ideas. The patient is nervous/anxious. The patient is not hyperactive.        Irritability    Per HPI unless specifically indicated above     Objective:    BP 108/73   Pulse 72   Temp 98.7 F (37.1 C) (Oral)   LMP 05/24/2023 (Approximate)   SpO2 99%   Wt Readings from Last 3 Encounters:  04/30/23 278 lb (126.1 kg)  02/25/23 282 lb (127.9 kg)  12/29/22 284 lb 12.8 oz (129.2 kg)    Physical Exam Vitals and nursing note reviewed.  Constitutional:      General: She is not in acute distress.    Appearance: Normal appearance. She is not ill-appearing, toxic-appearing or diaphoretic.  HENT:     Head: Normocephalic and atraumatic.     Right Ear: External ear normal.     Left Ear: External ear normal.      Nose: Nose normal.     Mouth/Throat:     Mouth: Mucous membranes are moist.  Pharynx: Oropharynx is clear.  Eyes:     General: No scleral icterus.       Right eye: No discharge.        Left eye: No discharge.     Extraocular Movements: Extraocular movements intact.     Conjunctiva/sclera: Conjunctivae normal.     Pupils: Pupils are equal, round, and reactive to light.  Cardiovascular:     Rate and Rhythm: Normal rate and regular rhythm.     Pulses: Normal pulses.     Heart sounds: Normal heart sounds. No murmur heard.    No friction rub. No gallop.  Pulmonary:     Effort: Pulmonary effort is normal. No respiratory distress.     Breath sounds: Normal breath sounds. No stridor. No wheezing, rhonchi or rales.  Chest:     Chest wall: No tenderness.  Musculoskeletal:        General: Normal range of motion.     Cervical back: Normal range of motion and neck supple.  Skin:    General: Skin is warm and dry.     Capillary Refill: Capillary refill takes less than 2 seconds.     Coloration: Skin is not jaundiced or pale.     Findings: No bruising, erythema, lesion or rash.  Neurological:     General: No focal deficit present.     Mental Status: She is alert and oriented to person, place, and time. Mental status is at baseline.  Psychiatric:        Mood and Affect: Mood normal.        Behavior: Behavior normal.        Thought Content: Thought content normal.        Judgment: Judgment normal.    Results for orders placed or performed in visit on 04/30/23  VITAMIN D 25 Hydroxy (Vit-D Deficiency, Fractures)  Result Value Ref Range   Vit D, 25-Hydroxy 15.7 (L) 30.0 - 100.0 ng/mL  Lipid Panel w/o Chol/HDL Ratio  Result Value Ref Range   Cholesterol, Total 197 100 - 199 mg/dL   Triglycerides 85 0 - 149 mg/dL   HDL 68 >82 mg/dL   VLDL Cholesterol Cal 15 5 - 40 mg/dL   LDL Chol Calc (NIH) 956 (H) 0 - 99 mg/dL  Comprehensive metabolic panel  Result Value Ref Range   Glucose 85 70 -  99 mg/dL   BUN 14 6 - 24 mg/dL   Creatinine, Ser 2.13 0.57 - 1.00 mg/dL   eGFR 95 >08 MV/HQI/6.96   BUN/Creatinine Ratio 18 9 - 23   Sodium 141 134 - 144 mmol/L   Potassium 3.9 3.5 - 5.2 mmol/L   Chloride 103 96 - 106 mmol/L   CO2 22 20 - 29 mmol/L   Calcium 9.2 8.7 - 10.2 mg/dL   Total Protein 6.6 6.0 - 8.5 g/dL   Albumin 4.3 3.9 - 4.9 g/dL   Globulin, Total 2.3 1.5 - 4.5 g/dL   Bilirubin Total 0.4 0.0 - 1.2 mg/dL   Alkaline Phosphatase 89 44 - 121 IU/L   AST 18 0 - 40 IU/L   ALT 17 0 - 32 IU/L  TSH  Result Value Ref Range   TSH 5.690 (H) 0.450 - 4.500 uIU/mL  LH  Result Value Ref Range   LH 6.5 mIU/mL  [facility]  Result Value Ref Range   FSH 5.6 mIU/mL  Estradiol  Result Value Ref Range   Estradiol 308.0 pg/mL      Assessment & Plan:  Problem List Items Addressed This Visit   None    Follow up plan: No follow-ups on file.

## 2023-06-17 NOTE — Assessment & Plan Note (Signed)
Doing a bit better. Continuing to follow with psychiatry. To start IOP in the next week or so- has an intake call later this week. Continue to monitor. Recheck in 1 week. Call with any concerns.

## 2023-06-22 ENCOUNTER — Telehealth (INDEPENDENT_AMBULATORY_CARE_PROVIDER_SITE_OTHER): Payer: BC Managed Care – PPO | Admitting: Family Medicine

## 2023-06-22 ENCOUNTER — Encounter: Payer: Self-pay | Admitting: Family Medicine

## 2023-06-22 DIAGNOSIS — F332 Major depressive disorder, recurrent severe without psychotic features: Secondary | ICD-10-CM

## 2023-06-22 MED ORDER — QUETIAPINE FUMARATE 25 MG PO TABS: 25.0000 mg | ORAL_TABLET | Freq: Every day | ORAL | 1 refills | Status: DC

## 2023-06-22 NOTE — Assessment & Plan Note (Signed)
Stable. Discussed being able to take up to 75mg  of her seroquel if needed. Call with any concerns. Recheck 1 week if not in PHP.

## 2023-06-22 NOTE — Progress Notes (Signed)
Pt has an appointment on 06/29/2023 @ 10:40 am.

## 2023-06-22 NOTE — Progress Notes (Signed)
LMP 05/24/2023 (Approximate)    Subjective:    Patient ID: Sharon Gibson, female    DOB: 10-09-1980, 42 y.o.   MRN: [id number]  HPI: Sharon Gibson is a 42 y.o. female  Chief Complaint  Patient presents with   Depression   ANXIETY/DEPRESSION Duration: chronic Status:uncontrolled Anxious mood: yes  Excessive worrying: yes Irritability: no  Sweating: no Nausea: no Palpitations:no Hyperventilation: no Panic attacks: no Agoraphobia: no  Obscessions/compulsions: no Depressed mood: yes    06/22/2023   10:39 AM 06/15/2023    4:20 PM 06/07/2023   10:31 AM 04/30/2023    9:48 AM 02/25/2023    2:22 PM  Depression screen PHQ 2/9  Decreased Interest 1 3 3  0 1  Down, Depressed, Hopeless 2 3 3  0 1  PHQ - 2 Score 3 6 6  0 2  Altered sleeping 0 1 3 3 1   Tired, decreased energy 1 2 3 1 2   Change in appetite 1 3 3 1 2   Feeling bad or failure about yourself  3 3 3  0 1  Trouble concentrating 0 3 2 0 3  Moving slowly or fidgety/restless 1 0 3 0 2  Suicidal thoughts 2 1  0 0  PHQ-9 Score 11 19 23 5 13   Difficult doing work/chores Somewhat difficult Extremely dIfficult Extremely dIfficult Not difficult at all Somewhat difficult      06/22/2023   10:41 AM 06/15/2023    4:21 PM 06/07/2023   10:31 AM 04/30/2023    9:49 AM  GAD 7 : Generalized Anxiety Score  Nervous, Anxious, on Edge 2 3 3 3   Control/stop worrying 1 3 3 1   Worry too much - different things 1 3 3 1   Trouble relaxing 1 3 2 1   Restless 1 1 2 2   Easily annoyed or irritable 0 3 1 0  Afraid - awful might happen 3 3 3 1   Total GAD 7 Score 9 19 17 9   Anxiety Difficulty Not difficult at all Extremely difficult Extremely difficult Somewhat difficult   Anhedonia: no Weight changes: no Insomnia: no   Hypersomnia: no Fatigue/loss of energy: yes Feelings of worthlessness: yes Feelings of guilt: yes Impaired concentration/indecisiveness: no Suicidal ideations: yes  Crying spells: yes Recent Stressors/Life Changes: yes    Relationship problems: no   Family stress: no     Financial stress: no    Job stress: no    Recent death/loss: no  Relevant past medical, surgical, family and social history reviewed and updated as indicated. Interim medical history since our last visit reviewed. Allergies and medications reviewed and updated.  Review of Systems  Constitutional: Negative.   Respiratory: Negative.    Cardiovascular: Negative.   Gastrointestinal: Negative.   Musculoskeletal: Negative.   Psychiatric/Behavioral:  Positive for dysphoric mood. Negative for agitation, behavioral problems, confusion, decreased concentration, hallucinations, self-injury, sleep disturbance and suicidal ideas. The patient is nervous/anxious. The patient is not hyperactive.     Per HPI unless specifically indicated above     Objective:    LMP 05/24/2023 (Approximate)   Wt Readings from Last 3 Encounters:  04/30/23 278 lb (126.1 kg)  02/25/23 282 lb (127.9 kg)  12/29/22 284 lb 12.8 oz (129.2 kg)    Physical Exam Vitals and nursing note reviewed.  Constitutional:      General: She is not in acute distress.    Appearance: Normal appearance. She is not ill-appearing, toxic-appearing or diaphoretic.  HENT:     Head: Normocephalic and atraumatic.  Right Ear: External ear normal.     Left Ear: External ear normal.     Nose: Nose normal.     Mouth/Throat:     Mouth: Mucous membranes are moist.     Pharynx: Oropharynx is clear.  Eyes:     General: No scleral icterus.       Right eye: No discharge.        Left eye: No discharge.     Conjunctiva/sclera: Conjunctivae normal.     Pupils: Pupils are equal, round, and reactive to light.  Pulmonary:     Effort: Pulmonary effort is normal. No respiratory distress.     Comments: Speaking in full sentences Musculoskeletal:        General: Normal range of motion.     Cervical back: Normal range of motion.  Skin:    Coloration: Skin is not jaundiced or pale.     Findings:  No bruising, erythema, lesion or rash.  Neurological:     Mental Status: She is alert and oriented to person, place, and time. Mental status is at baseline.  Psychiatric:        Mood and Affect: Mood normal.        Behavior: Behavior normal.        Thought Content: Thought content normal.        Judgment: Judgment normal.     Results for orders placed or performed in visit on 04/30/23  VITAMIN D 25 Hydroxy (Vit-D Deficiency, Fractures)  Result Value Ref Range   Vit D, 25-Hydroxy 15.7 (L) 30.0 - 100.0 ng/mL  Lipid Panel w/o Chol/HDL Ratio  Result Value Ref Range   Cholesterol, Total 197 100 - 199 mg/dL   Triglycerides 85 0 - 149 mg/dL   HDL 68 >40 mg/dL   VLDL Cholesterol Cal 15 5 - 40 mg/dL   LDL Chol Calc (NIH) 981 (H) 0 - 99 mg/dL  Comprehensive metabolic panel  Result Value Ref Range   Glucose 85 70 - 99 mg/dL   BUN 14 6 - 24 mg/dL   Creatinine, Ser 1.91 0.57 - 1.00 mg/dL   eGFR 95 >47 WG/NFA/2.13   BUN/Creatinine Ratio 18 9 - 23   Sodium 141 134 - 144 mmol/L   Potassium 3.9 3.5 - 5.2 mmol/L   Chloride 103 96 - 106 mmol/L   CO2 22 20 - 29 mmol/L   Calcium 9.2 8.7 - 10.2 mg/dL   Total Protein 6.6 6.0 - 8.5 g/dL   Albumin 4.3 3.9 - 4.9 g/dL   Globulin, Total 2.3 1.5 - 4.5 g/dL   Bilirubin Total 0.4 0.0 - 1.2 mg/dL   Alkaline Phosphatase 89 44 - 121 IU/L   AST 18 0 - 40 IU/L   ALT 17 0 - 32 IU/L  TSH  Result Value Ref Range   TSH 5.690 (H) 0.450 - 4.500 uIU/mL  LH  Result Value Ref Range   LH 6.5 mIU/mL  FSH  Result Value Ref Range   FSH 5.6 mIU/mL  Estradiol  Result Value Ref Range   Estradiol 308.0 pg/mL      Assessment & Plan:   Problem List Items Addressed This Visit       Other   Severe episode of recurrent major depressive disorder, without psychotic features (HCC) - Primary    Stable. Discussed being able to take up to 75mg  of her seroquel if needed. Call with any concerns. Recheck 1 week if not in PHP.  Follow up plan: Return in about  1 week (around 06/29/2023).   This visit was completed via video visit through MyChart due to the restrictions of the COVID-19 pandemic. All issues as above were discussed and addressed. Physical exam was done as above through visual confirmation on video through MyChart. If it was felt that the patient should be evaluated in the office, they were directed there. The patient verbally consented to this visit. Location of the patient: home Location of the provider: work Those involved with this call:  Provider: Olevia Perches, DO CMA: Malen Gauze, CMA Front Desk/Registration:  Servando Snare   Time spent on call:  15 minutes with patient face to face via video conference. More than 50% of this time was spent in counseling and coordination of care. 23 minutes total spent in review of patient's record and preparation of their chart.

## 2023-06-24 ENCOUNTER — Inpatient Hospital Stay: Payer: BC Managed Care – PPO | Attending: Oncology

## 2023-06-29 ENCOUNTER — Encounter: Payer: Self-pay | Admitting: Family Medicine

## 2023-06-29 ENCOUNTER — Ambulatory Visit: Payer: BC Managed Care – PPO | Admitting: Family Medicine

## 2023-06-29 ENCOUNTER — Other Ambulatory Visit: Payer: Self-pay | Admitting: Family Medicine

## 2023-06-29 ENCOUNTER — Telehealth (INDEPENDENT_AMBULATORY_CARE_PROVIDER_SITE_OTHER): Payer: BC Managed Care – PPO | Admitting: Family Medicine

## 2023-06-29 ENCOUNTER — Inpatient Hospital Stay: Payer: BC Managed Care – PPO | Attending: Oncology

## 2023-06-29 VITALS — BP 134/88

## 2023-06-29 DIAGNOSIS — Z79899 Other long term (current) drug therapy: Secondary | ICD-10-CM | POA: Diagnosis not present

## 2023-06-29 DIAGNOSIS — F332 Major depressive disorder, recurrent severe without psychotic features: Secondary | ICD-10-CM

## 2023-06-29 DIAGNOSIS — D509 Iron deficiency anemia, unspecified: Secondary | ICD-10-CM | POA: Diagnosis present

## 2023-06-29 DIAGNOSIS — E538 Deficiency of other specified B group vitamins: Secondary | ICD-10-CM | POA: Diagnosis not present

## 2023-06-29 LAB — CBC WITH DIFFERENTIAL (CANCER CENTER ONLY)
Abs Immature Granulocytes: 0.04 10*3/uL (ref 0.00–0.07)
Basophils Absolute: 0.1 10*3/uL (ref 0.0–0.1)
Basophils Relative: 1 %
Eosinophils Absolute: 0.3 10*3/uL (ref 0.0–0.5)
Eosinophils Relative: 3 %
HCT: 40.9 % (ref 36.0–46.0)
Hemoglobin: 13 g/dL (ref 12.0–15.0)
Immature Granulocytes: 0 %
Lymphocytes Relative: 26 %
Lymphs Abs: 2.6 10*3/uL (ref 0.7–4.0)
MCH: 27.7 pg (ref 26.0–34.0)
MCHC: 31.8 g/dL (ref 30.0–36.0)
MCV: 87.2 fL (ref 80.0–100.0)
Monocytes Absolute: 0.6 10*3/uL (ref 0.1–1.0)
Monocytes Relative: 6 %
Neutro Abs: 6.5 10*3/uL (ref 1.7–7.7)
Neutrophils Relative %: 64 %
Platelet Count: 307 10*3/uL (ref 150–400)
RBC: 4.69 MIL/uL (ref 3.87–5.11)
RDW: 13.5 % (ref 11.5–15.5)
WBC Count: 10.1 10*3/uL (ref 4.0–10.5)
nRBC: 0 % (ref 0.0–0.2)

## 2023-06-29 LAB — IRON AND TIBC
Iron: 42 ug/dL (ref 28–170)
Saturation Ratios: 10 % — ABNORMAL LOW (ref 10.4–31.8)
TIBC: 413 ug/dL (ref 250–450)
UIBC: 371 ug/dL

## 2023-06-29 LAB — VITAMIN B12: Vitamin B-12: 203 pg/mL (ref 180–914)

## 2023-06-29 LAB — FERRITIN: Ferritin: 8 ng/mL — ABNORMAL LOW (ref 11–307)

## 2023-06-29 LAB — FOLATE: Folate: 8.9 ng/mL (ref >5.9)

## 2023-06-29 MED ORDER — BREXPIPRAZOLE 0.25 MG PO TABS: ORAL_TABLET | ORAL | 0 refills | Status: DC

## 2023-06-29 MED ORDER — CLONAZEPAM 0.5 MG PO TABS: 0.5000 mg | ORAL_TABLET | Freq: Every day | ORAL | 0 refills | Status: DC | PRN

## 2023-06-29 NOTE — Progress Notes (Signed)
BP 134/88   LMP 06/22/2023    Subjective:    Patient ID: Sharon Gibson, female    DOB: April 19, 1981, 42 y.o.   MRN: [id number]  HPI: Sharon Gibson is a 42 y.o. female  Chief Complaint  Patient presents with   Depression   ANXIETY/DEPRESSION- has been meeting with psychiatry, is not going to do her partial program- has been meeting very closely with them.  Duration: chronic Status:exacerbated Anxious mood: yes  Excessive worrying: yes Irritability: yes  Sweating: no Nausea: no Palpitations:yes Hyperventilation: no Panic attacks: yes Agoraphobia: yes  Obscessions/compulsions: yes Depressed mood: yes    06/22/2023   10:39 AM 06/15/2023    4:20 PM 06/07/2023   10:31 AM 04/30/2023    9:48 AM 02/25/2023    2:22 PM  Depression screen PHQ 2/9  Decreased Interest 1 3 3  0 1  Down, Depressed, Hopeless 2 3 3  0 1  PHQ - 2 Score 3 6 6  0 2  Altered sleeping 0 1 3 3 1   Tired, decreased energy 1 2 3 1 2   Change in appetite 1 3 3 1 2   Feeling bad or failure about yourself  3 3 3  0 1  Trouble concentrating 0 3 2 0 3  Moving slowly or fidgety/restless 1 0 3 0 2  Suicidal thoughts 2 1  0 0  PHQ-9 Score 11 19 23 5 13   Difficult doing work/chores Somewhat difficult Extremely dIfficult Extremely dIfficult Not difficult at all Somewhat difficult      06/22/2023   10:41 AM 06/15/2023    4:21 PM 06/07/2023   10:31 AM 04/30/2023    9:49 AM  GAD 7 : Generalized Anxiety Score  Nervous, Anxious, on Edge 2 3 3 3   Control/stop worrying 1 3 3 1   Worry too much - different things 1 3 3 1   Trouble relaxing 1 3 2 1   Restless 1 1 2 2   Easily annoyed or irritable 0 3 1 0  Afraid - awful might happen 3 3 3 1   Total GAD 7 Score 9 19 17 9   Anxiety Difficulty Not difficult at all Extremely difficult Extremely difficult Somewhat difficult   Anhedonia: no Weight changes: no Insomnia: yes hard to fall asleep  Hypersomnia: yes Fatigue/loss of energy: yes Feelings of worthlessness: yes Feelings  of guilt: yes Impaired concentration/indecisiveness: yes Suicidal ideations: yes  Crying spells: yes Recent Stressors/Life Changes: yes   Relationship problems: no   Family stress: no     Financial stress: no    Job stress: no    Recent death/loss: no  Relevant past medical, surgical, family and social history reviewed and updated as indicated. Interim medical history since our last visit reviewed. Allergies and medications reviewed and updated.  Review of Systems  Constitutional: Negative.   Respiratory: Negative.    Cardiovascular: Negative.   Musculoskeletal: Negative.   Skin: Negative.   Psychiatric/Behavioral:  Positive for agitation, decreased concentration, dysphoric mood and sleep disturbance. Negative for behavioral problems, confusion, hallucinations, self-injury and suicidal ideas. The patient is nervous/anxious. The patient is not hyperactive.     Per HPI unless specifically indicated above     Objective:    BP 134/88   LMP 06/22/2023   Wt Readings from Last 3 Encounters:  04/30/23 278 lb (126.1 kg)  02/25/23 282 lb (127.9 kg)  12/29/22 284 lb 12.8 oz (129.2 kg)    Physical Exam Vitals and nursing note reviewed.  Constitutional:      General: She is  not in acute distress.    Appearance: Normal appearance. She is not ill-appearing, toxic-appearing or diaphoretic.  HENT:     Head: Normocephalic and atraumatic.     Right Ear: External ear normal.     Left Ear: External ear normal.     Nose: Nose normal.     Mouth/Throat:     Mouth: Mucous membranes are moist.     Pharynx: Oropharynx is clear.  Eyes:     General: No scleral icterus.       Right eye: No discharge.        Left eye: No discharge.     Conjunctiva/sclera: Conjunctivae normal.     Pupils: Pupils are equal, round, and reactive to light.  Pulmonary:     Effort: Pulmonary effort is normal. No respiratory distress.     Comments: Speaking in full sentences Musculoskeletal:        General: Normal  range of motion.     Cervical back: Normal range of motion.  Skin:    Coloration: Skin is not jaundiced or pale.     Findings: No bruising, erythema, lesion or rash.  Neurological:     Mental Status: She is alert and oriented to person, place, and time. Mental status is at baseline.  Psychiatric:        Mood and Affect: Mood normal.        Behavior: Behavior normal.        Thought Content: Thought content normal.        Judgment: Judgment normal.     Results for orders placed or performed in visit on 04/30/23  VITAMIN D 25 Hydroxy (Vit-D Deficiency, Fractures)  Result Value Ref Range   Vit D, 25-Hydroxy 15.7 (L) 30.0 - 100.0 ng/mL  Lipid Panel w/o Chol/HDL Ratio  Result Value Ref Range   Cholesterol, Total 197 100 - 199 mg/dL   Triglycerides 85 0 - 149 mg/dL   HDL 68 >81 mg/dL   VLDL Cholesterol Cal 15 5 - 40 mg/dL   LDL Chol Calc (NIH) 191 (H) 0 - 99 mg/dL  Comprehensive metabolic panel  Result Value Ref Range   Glucose 85 70 - 99 mg/dL   BUN 14 6 - 24 mg/dL   Creatinine, Ser 4.78 0.57 - 1.00 mg/dL   eGFR 95 >29 FA/OZH/0.86   BUN/Creatinine Ratio 18 9 - 23   Sodium 141 134 - 144 mmol/L   Potassium 3.9 3.5 - 5.2 mmol/L   Chloride 103 96 - 106 mmol/L   CO2 22 20 - 29 mmol/L   Calcium 9.2 8.7 - 10.2 mg/dL   Total Protein 6.6 6.0 - 8.5 g/dL   Albumin 4.3 3.9 - 4.9 g/dL   Globulin, Total 2.3 1.5 - 4.5 g/dL   Bilirubin Total 0.4 0.0 - 1.2 mg/dL   Alkaline Phosphatase 89 44 - 121 IU/L   AST 18 0 - 40 IU/L   ALT 17 0 - 32 IU/L  TSH  Result Value Ref Range   TSH 5.690 (H) 0.450 - 4.500 uIU/mL  LH  Result Value Ref Range   LH 6.5 mIU/mL  FSH  Result Value Ref Range   FSH 5.6 mIU/mL  Estradiol  Result Value Ref Range   Estradiol 308.0 pg/mL      Assessment & Plan:   Problem List Items Addressed This Visit       Other   Severe episode of recurrent major depressive disorder, without psychotic features (HCC) - Primary    Not  doing well. Will change seroquel to  rexaulti and recheck in 1 week. Klonopin PRN for panic. Call with any concerns.         Follow up plan: Return in about 1 week (around 07/06/2023).    This visit was completed via video visit through MyChart due to the restrictions of the COVID-19 pandemic. All issues as above were discussed and addressed. Physical exam was done as above through visual confirmation on video through MyChart. If it was felt that the patient should be evaluated in the office, they were directed there. The patient verbally consented to this visit. Location of the patient:  home Location of the provider: work Those involved with this call:  Provider: Olevia Perches, DO CMA:  Delrae Sawyers, CMA Front Desk/Registration:  Servando Snare   Time spent on call:  15 minutes with patient face to face via video conference. More than 50% of this time was spent in counseling and coordination of care. 23 minutes total spent in review of patient's record and preparation of their chart.

## 2023-06-29 NOTE — Assessment & Plan Note (Signed)
Not doing well. Will change seroquel to rexaulti and recheck in 1 week. Klonopin PRN for panic. Call with any concerns.

## 2023-06-29 NOTE — Progress Notes (Signed)
Appointment has been made

## 2023-06-29 NOTE — Patient Instructions (Signed)
[email or web address]

## 2023-06-30 ENCOUNTER — Other Ambulatory Visit: Payer: Self-pay | Admitting: Family Medicine

## 2023-06-30 DIAGNOSIS — F4310 Post-traumatic stress disorder, unspecified: Secondary | ICD-10-CM

## 2023-06-30 DIAGNOSIS — F332 Major depressive disorder, recurrent severe without psychotic features: Secondary | ICD-10-CM

## 2023-06-30 NOTE — Telephone Encounter (Signed)
Requested medication (s) are due for refill today: yes  Requested medication (s) are on the active medication list: yes  Last refill:  06/07/23  Future visit scheduled: yes  Notes to clinic:  Unable to refill per protocol, cannot delegate.      Requested Prescriptions  Pending Prescriptions Disp Refills   tiZANidine (ZANAFLEX) 4 MG tablet [Pharmacy Med Name: TIZANIDINE HCL 4 MG TABLET] 90 tablet 1    Sig: TAKE 1 TABLET BY MOUTH AT BEDTIME.     Not Delegated - Cardiovascular:  Alpha-2 Agonists - tizanidine Failed - 06/29/2023  1:30 PM      Failed - This refill cannot be delegated      Passed - Valid encounter within last 6 months    Recent Outpatient Visits           Yesterday Severe episode of recurrent major depressive disorder, without psychotic features (HCC)   [place] [facility] [place], Megan P, DO   1 week ago Severe episode of recurrent major depressive disorder, without psychotic features (HCC)   [place] [facility] [place], Megan P, DO   2 weeks ago Severe episode of recurrent major depressive disorder, without psychotic features [facility])   [place] [facility] [place], Megan P, DO   3 weeks ago Chest discomfort   [place] [facility] [place], [place], DO   2 months ago Acquired hypothyroidism   [place] [facility] Dorcas Carrow, DO       Future Appointments             In 6 days Dorcas Carrow, DO [place] [facility], [facility]

## 2023-06-30 NOTE — Telephone Encounter (Signed)
Requested Prescriptions  Pending Prescriptions Disp Refills   sertraline (ZOLOFT) 100 MG tablet [Pharmacy Med Name: SERTRALINE HCL 100 MG TABLET] 225 tablet 0    Sig: TAKE 2.5 TABLETS (250 MG TOTAL) BY MOUTH AS DIRECTED.     Psychiatry:  Antidepressants - SSRI - sertraline Passed - 06/30/2023  1:31 AM      Passed - AST in normal range and within 360 days    AST  Date Value Ref Range Status  04/30/2023 18 0 - 40 IU/L Final         Passed - ALT in normal range and within 360 days    ALT  Date Value Ref Range Status  04/30/2023 17 0 - 32 IU/L Final         Passed - Completed PHQ-2 or PHQ-9 in the last 360 days      Passed - Valid encounter within last 6 months    Recent Outpatient Visits           Yesterday Severe episode of recurrent major depressive disorder, without psychotic features (HCC)   [place] [facility] [place], Megan P, DO   1 week ago Severe episode of recurrent major depressive disorder, without psychotic features (HCC)   [place] [facility] [place], Megan P, DO   2 weeks ago Severe episode of recurrent major depressive disorder, without psychotic features [facility])   [place] [facility] [place], Megan P, DO   3 weeks ago Chest discomfort   [place] [facility] [place], [place], DO   2 months ago Acquired hypothyroidism   [place] [facility] Dorcas Carrow, DO       Future Appointments             In 6 days Dorcas Carrow, DO  [facility], [facility]

## 2023-07-01 ENCOUNTER — Inpatient Hospital Stay: Payer: BC Managed Care – PPO

## 2023-07-01 ENCOUNTER — Encounter: Payer: Self-pay | Admitting: Oncology

## 2023-07-01 ENCOUNTER — Inpatient Hospital Stay: Payer: BC Managed Care – PPO | Admitting: Oncology

## 2023-07-01 VITALS — BP 146/82 | HR 69 | Temp 97.7°F | Ht 63.0 in | Wt 276.1 lb

## 2023-07-01 VITALS — BP 114/67 | HR 57 | Temp 98.4°F | Resp 18

## 2023-07-01 DIAGNOSIS — E538 Deficiency of other specified B group vitamins: Secondary | ICD-10-CM

## 2023-07-01 DIAGNOSIS — D509 Iron deficiency anemia, unspecified: Secondary | ICD-10-CM | POA: Diagnosis not present

## 2023-07-01 DIAGNOSIS — D508 Other iron deficiency anemias: Secondary | ICD-10-CM

## 2023-07-01 MED ORDER — DIPHENHYDRAMINE HCL 25 MG PO CAPS
50.0000 mg | ORAL_CAPSULE | Freq: Once | ORAL | Status: AC
  Administered 2023-07-01: 50 mg via ORAL
  Filled 2023-07-01: qty 2

## 2023-07-01 MED ORDER — SODIUM CHLORIDE 0.9 % IV SOLN
INTRAVENOUS | Status: DC
  Filled 2023-07-01: qty 250

## 2023-07-01 MED ORDER — CYANOCOBALAMIN 1000 MCG/ML IJ SOLN
1000.0000 ug | Freq: Once | INTRAMUSCULAR | Status: AC
  Administered 2023-07-01: 1000 ug via INTRAMUSCULAR
  Filled 2023-07-01: qty 1

## 2023-07-01 MED ORDER — SODIUM CHLORIDE 0.9 % IV SOLN
10.0000 mg | Freq: Once | INTRAVENOUS | Status: AC
  Administered 2023-07-01: 10 mg via INTRAVENOUS
  Filled 2023-07-01: qty 10

## 2023-07-01 MED ORDER — ACETAMINOPHEN 325 MG PO TABS
650.0000 mg | ORAL_TABLET | Freq: Once | ORAL | Status: AC
  Administered 2023-07-01: 650 mg via ORAL
  Filled 2023-07-01: qty 2

## 2023-07-01 MED ORDER — SODIUM CHLORIDE 0.9 % IV SOLN
200.0000 mg | Freq: Once | INTRAVENOUS | Status: AC
  Administered 2023-07-01: 200 mg via INTRAVENOUS
  Filled 2023-07-01: qty 200

## 2023-07-01 NOTE — Progress Notes (Signed)
Hematology/Oncology Progress note [phone] [phone]     CHIEF COMPLIANT: Sharon Gibson is a 42 y.o. female presents for follow up of  iron and B12 deficiency   ASSESSMENT & PLAN:   Iron deficiency anemia Iron deficiency in the context of history of gastric bypass Lab Results  Component Value Date   HGB 13.0 06/29/2023   TIBC 413 06/29/2023   IRONPCTSAT 10 (L) 06/29/2023   FERRITIN 8 (L) 06/29/2023     Recommend IV Venofer 200 mg x 3   Patient declines pregnancy testing due to no chance of pregnancy.  She reports that she is currently not sexually active. Patient gets Tylenol 650 mg p.o. x1, Benadryl 50 mg p.o. x1, Decadron 10 mg x 1 as premed prior to Venofer treatment to minimize infusion reactions   B12 deficiency Recommend to resuming monthly vitamin B12 injections. She prefers to receive B12 injections during her visits and take sublingual vitamin B12  daily at home. Continue sublingual vitamin B12 dose to daily. Rx sent.  B12 injection x1 today. B12 level is within normal limit, low normal end.    Orders Placed This Encounter  Procedures   CBC with Differential (Cancer Center Only)    Standing Status:   Future    Standing Expiration Date:   06/30/2024   Iron and TIBC    Standing Status:   Future    Standing Expiration Date:   06/30/2024   Ferritin    Standing Status:   Future    Standing Expiration Date:   06/30/2024   Vitamin B12    Standing Status:   Future    Standing Expiration Date:   06/30/2024   Retic Panel    Standing Status:   Future    Standing Expiration Date:   06/30/2024   Follow up in 6 months.  All questions were answered. The patient knows to call the clinic with any problems, questions or concerns.  Rickard Patience, MD, PhD [facility] Hematology Oncology 07/01/2023   PERTINENT HEMATOLOGY HISTORY Patient previously followed up by Dr.Corcoran, patient switched care to me on 05/08/21 Extensive medical record  review was performed by me  #Patient has a history of gastric bypass. #Iron deficiency anemia, she has received IV iron (different preparations) x 5 ([place], [place]; [place], [place];  [place], [place]) with reactions requiring Benadryl and steroids. Per patient she had side effects from IV Venofer treatments, including dizziness, lightheadedness, low blood pressure, excess sweating, aches, and pains, nausea, and headache after her infusion. She has received Feraheme treatments as well as Venofer treatments previously.  Most recently Venofer treatments.  Patient gets Benadryl, steroids as premed to minimize adverse reactions.  #She has B12 deficiency.  Patient was previously on monthly vitamin B12 injections. Due to her work, she has not been able to come to get monthly injections.   #History of multiple pulmonary emboli in 2003.  Per patient report, hypercoagulable work-up was negative.  She was on Coumadin x 6 months.  Not currently on anticoagulation.   INTERVAL HISTORY Sharon Gibson is a 42 y.o. female who has above history reviewed by me not presents for follow up visit for management of iron deficiency anemia and vitamin B12 deficiency Some fatigue, chronic, no other new complaints. She takes  sublingual vitamin B12 supplementation daily.  Past Medical History:  Diagnosis Date   Anemia    Anxiety    Chicken pox    Depression    Eating disorder    Has had  residential treatment and hospitalization previously.   Heart murmur    as a child   History of anemia    RECIEVES IRON INFUSIONS YEARLY   History of pulmonary embolus (PE) 2002   bilaterally-followed by [facility] hematology-w/u negative per pt   Hypothyroidism    no meds currently   PTSD (post-traumatic stress disorder)     Past Surgical History:  Procedure Laterality Date   CHOLECYSTECTOMY  2003   EXCISION OF ENDOMETRIOMA     GASTRIC BYPASS  2003   LAPAROSCOPIC OVARIAN CYSTECTOMY Left 05/10/2018   Procedure: LAPAROSCOPIC  OVARIAN CYSTECTOMY;  Surgeon: Natale Milch, MD;  Location: [facility] ORS;  Service: Gynecology;  Laterality: Left;    Family History  Problem Relation Age of Onset   Depression Father    Diabetes Father    Anxiety disorder Father    Alzheimer's disease Maternal Grandfather    Alzheimer's disease Maternal Grandmother    Prostate cancer Paternal Grandfather    Lung cancer Paternal Grandmother     Social History:  reports that she has never smoked. She has never used smokeless tobacco. She reports that she does not currently use alcohol. She reports that she does not use drugs.   Allergies:  Allergies  Allergen Reactions   Penicillins Hives    Has patient had a PCN reaction causing immediate rash, facial/tongue/throat swelling, SOB or lightheadedness with hypotension: yes Has patient had a PCN reaction causing severe rash involving mucus membranes or skin necrosis: no Has patient had a PCN reaction that required hospitalization: yes Has patient had a PCN reaction occurring within the last 10 years: no If all of the above answers are "NO", then may proceed with Cephalosporin use.    Strawberry (Diagnostic) Swelling    AND WALNUTS-TONGUE SWELLING   Other Other (See Comments)    Narcotics- Burning in the stomach (whether PO or IV)   Nsaids Other (See Comments)   Azithromycin Nausea Only    Current Medications: Current Outpatient Medications  Medication Sig Dispense Refill   clonazePAM (KLONOPIN) 0.5 MG tablet Take 1 tablet (0.5 mg total) by mouth daily as needed for anxiety. 30 tablet 0   gabapentin (NEURONTIN) 100 MG capsule Take 1 capsule (100 mg total) by mouth at bedtime. 90 capsule 1   levothyroxine (SYNTHROID) 50 MCG tablet TAKE 1 TABLET BY MOUTH EVERY DAY 90 tablet 0   sertraline (ZOLOFT) 100 MG tablet TAKE 2.5 TABLETS (250 MG TOTAL) BY MOUTH AS DIRECTED. (Patient taking differently: Take 200 mg by mouth daily.) 225 tablet 0   tiZANidine (ZANAFLEX) 4 MG tablet Take 1  tablet (4 mg total) by mouth at bedtime. 30 tablet 1   brexpiprazole (REXULTI) 0.25 MG TABS tablet Take 1 tablet (0.25 mg total) by mouth daily for 7 days, THEN 2 tablets (0.5 mg total) daily for 7 days, THEN 4 tablets (1 mg total) daily. (Patient not taking: Reported on 07/01/2023) 141 tablet 0   metroNIDAZOLE (METROGEL) 1 % gel Apply topically daily. (Patient not taking: Reported on 07/01/2023) 45 g 3   Vitamin D, Ergocalciferol, (DRISDOL) 1.25 MG (50000 UNIT) CAPS capsule Take 1 capsule (50,000 Units total) by mouth every 7 (seven) days. (Patient not taking: Reported on 07/01/2023) 12 capsule 1   No current facility-administered medications for this visit.   Facility-Administered Medications Ordered in Other Visits  Medication Dose Route Frequency Provider Last Rate Last Admin   0.9 %  sodium chloride infusion   Intravenous Continuous Rickard Patience, MD   Stopped  at 07/01/23 1556    Review of Systems  Constitutional:  Negative for chills, fever and malaise/fatigue.  HENT:  Negative for sore throat.   Eyes:  Negative for redness.  Respiratory:  Negative for cough, shortness of breath and wheezing.   Cardiovascular:  Negative for chest pain, palpitations and leg swelling.  Gastrointestinal:  Negative for abdominal pain, blood in stool, nausea and vomiting.  Genitourinary:  Negative for dysuria.  Musculoskeletal:  Negative for myalgias.  Skin:  Negative for rash.  Neurological:  Negative for dizziness, tingling and tremors.  Endo/Heme/Allergies:  Does not bruise/bleed easily.  Psychiatric/Behavioral:  Negative for hallucinations.    Performance status (ECOG): 1  Vitals Blood pressure (!) 146/82, pulse 69, temperature 97.7 F (36.5 C), temperature source Tympanic, height 5\' 3"  (1.6 m), weight 276 lb 1.6 oz (125.2 kg), last menstrual period 06/22/2023, SpO2 99%.   Physical Exam Constitutional:      General: She is not in acute distress.    Appearance: She is well-developed. She is not  diaphoretic.  HENT:     Head: Normocephalic.  Eyes:     General: No scleral icterus.    Pupils: Pupils are equal, round, and reactive to light.  Neck:     Vascular: No JVD.  Cardiovascular:     Rate and Rhythm: Normal rate and regular rhythm.     Heart sounds: Murmur heard.  Pulmonary:     Effort: Pulmonary effort is normal. No respiratory distress.     Breath sounds: Normal breath sounds.  Abdominal:     General: Bowel sounds are normal. There is no distension.     Palpations: Abdomen is soft.  Musculoskeletal:        General: Normal range of motion.     Cervical back: Normal range of motion and neck supple.  Lymphadenopathy:     Head:     Right side of head: No preauricular, posterior auricular or occipital adenopathy.     Left side of head: No preauricular, posterior auricular or occipital adenopathy.     Cervical: No cervical adenopathy.     Upper Body:     Right upper body: No supraclavicular adenopathy.     Left upper body: No supraclavicular adenopathy.     Lower Body: No right inguinal adenopathy. No left inguinal adenopathy.  Skin:    General: Skin is warm and dry.  Neurological:     Mental Status: She is alert and oriented to person, place, and time. Mental status is at baseline.  Psychiatric:        Mood and Affect: Mood normal.    Laboratory studies    Latest Ref Rng & Units 06/29/2023    3:20 PM 12/29/2022   10:59 AM 12/18/2022   12:05 PM  CBC  WBC 4.0 - 10.5 K/uL 10.1  7.9  8.4   Hemoglobin 12.0 - 15.0 g/dL 16.1  09.6  04.5   Hematocrit 36.0 - 46.0 % 40.9  40.1  42.9   Platelets 150 - 400 K/uL 307  288  272       Latest Ref Rng & Units 04/30/2023   10:28 AM 12/29/2022   10:59 AM 12/18/2022   12:05 PM  CMP  Glucose 70 - 99 mg/dL 85  91  95   BUN 6 - 24 mg/dL 14  15  22    Creatinine 0.57 - 1.00 mg/dL 4.09  8.11  9.14   Sodium 134 - 144 mmol/L 141  139  134  Potassium 3.5 - 5.2 mmol/L 3.9  4.6  3.9   Chloride 96 - 106 mmol/L 103  105  108   CO2 20 - 29  mmol/L 22  17  21    Calcium 8.7 - 10.2 mg/dL 9.2  9.7  8.9   Total Protein 6.0 - 8.5 g/dL 6.6  6.6  7.3   Total Bilirubin 0.0 - 1.2 mg/dL 0.4  0.3  0.3   Alkaline Phos 44 - 121 IU/L 89  102  74   AST 0 - 40 IU/L 18  20  19    ALT 0 - 32 IU/L 17  19  16     Lab Results  Component Value Date   IRON 42 06/29/2023   TIBC 413 06/29/2023   FERRITIN 8 (L) 06/29/2023

## 2023-07-01 NOTE — Patient Instructions (Signed)
Iron Sucrose Injection What is this medication? IRON SUCROSE (EYE ern SOO krose) treats low levels of iron (iron deficiency anemia) in people with kidney disease. Iron is a mineral that plays an important role in making red blood cells, which carry oxygen from your lungs to the rest of your body. This medicine may be used for other purposes; ask your health care provider or pharmacist if you have questions. COMMON BRAND NAME(S): Venofer What should I tell my care team before I take this medication? They need to know if you have any of these conditions: Anemia not caused by low iron levels Heart disease High levels of iron in the blood Kidney disease Liver disease An unusual or allergic reaction to iron, other medications, foods, dyes, or preservatives Pregnant or trying to get pregnant Breastfeeding How should I use this medication? This medication is for infusion into a vein. It is given in a hospital or clinic setting. Talk to your care team about the use of this medication in children. While this medication may be prescribed for children as young as 2 years for selected conditions, precautions do apply. Overdosage: If you think you have taken too much of this medicine contact a poison control center or emergency room at once. NOTE: This medicine is only for you. Do not share this medicine with others. What if I miss a dose? Keep appointments for follow-up doses. It is important not to miss your dose. Call your care team if you are unable to keep an appointment. What may interact with this medication? Do not take this medication with any of the following: Deferoxamine Dimercaprol Other iron products This medication may also interact with the following: Chloramphenicol Deferasirox This list may not describe all possible interactions. Give your health care provider a list of all the medicines, herbs, non-prescription drugs, or dietary supplements you use. Also tell them if you smoke,  drink alcohol, or use illegal drugs. Some items may interact with your medicine. What should I watch for while using this medication? Visit your care team regularly. Tell your care team if your symptoms do not start to get better or if they get worse. You may need blood work done while you are taking this medication. You may need to follow a special diet. Talk to your care team. Foods that contain iron include: whole grains/cereals, dried fruits, beans, or peas, leafy green vegetables, and organ meats (liver, kidney). What side effects may I notice from receiving this medication? Side effects that you should report to your care team as soon as possible: Allergic reactions--skin rash, itching, hives, swelling of the face, lips, tongue, or throat Low blood pressure--dizziness, feeling faint or lightheaded, blurry vision Shortness of breath Side effects that usually do not require medical attention (report to your care team if they continue or are bothersome): Flushing Headache Joint pain Muscle pain Nausea Pain, redness, or irritation at injection site This list may not describe all possible side effects. Call your doctor for medical advice about side effects. You may report side effects to FDA at 1-800-FDA-1088. Where should I keep my medication? This medication is given in a hospital or clinic. It will not be stored at home. NOTE: This sheet is a summary. It may not cover all possible information. If you have questions about this medicine, talk to your doctor, pharmacist, or health care provider.  2024 Elsevier/Gold Standard (2023-02-19 00:00:00)

## 2023-07-01 NOTE — Assessment & Plan Note (Addendum)
Iron deficiency in the context of history of gastric bypass Lab Results  Component Value Date   HGB 13.0 06/29/2023   TIBC 413 06/29/2023   IRONPCTSAT 10 (L) 06/29/2023   FERRITIN 8 (L) 06/29/2023     Recommend IV Venofer 200 mg x 3   Patient declines pregnancy testing due to no chance of pregnancy.  She reports that she is currently not sexually active. Patient gets Tylenol 650 mg p.o. x1, Benadryl 50 mg p.o. x1, Decadron 10 mg x 1 as premed prior to Venofer treatment to minimize infusion reactions

## 2023-07-01 NOTE — Assessment & Plan Note (Addendum)
Recommend to resuming monthly vitamin B12 injections. She prefers to receive B12 injections during her visits and take sublingual vitamin B12  daily at home. Continue sublingual vitamin B12 dose to daily. Rx sent.  B12 injection x1 today. B12 level is within normal limit, low normal end.

## 2023-07-06 ENCOUNTER — Encounter: Payer: Self-pay | Admitting: Family Medicine

## 2023-07-06 ENCOUNTER — Ambulatory Visit: Payer: BC Managed Care – PPO | Admitting: Family Medicine

## 2023-07-06 VITALS — BP 117/67 | HR 76

## 2023-07-06 DIAGNOSIS — Z23 Encounter for immunization: Secondary | ICD-10-CM

## 2023-07-06 DIAGNOSIS — F332 Major depressive disorder, recurrent severe without psychotic features: Secondary | ICD-10-CM | POA: Diagnosis not present

## 2023-07-06 DIAGNOSIS — M79675 Pain in left toe(s): Secondary | ICD-10-CM

## 2023-07-06 NOTE — Progress Notes (Signed)
BP 117/67   Pulse 76   LMP 06/22/2023   SpO2 98%    Subjective:    Patient ID: Sharon Gibson, female    DOB: 10/29/1980, 42 y.o.   MRN: [id number]  HPI: [name] is a 42 y.o. female  Chief Complaint  Patient presents with   Depression   Toe Injury    Patient says she slammed her little toe on L foot in the door while she was painting. Patient says she has not been able to move or bend it completely.    ANXIETY/DEPRESSION- did not start the rexaulti Duration: chronic Status:uncontrolled Anxious mood: yes  Excessive worrying: no Irritability: no  Sweating: yes Nausea: yes Palpitations:yes Hyperventilation: no Panic attacks: no Agoraphobia: no  Obscessions/compulsions: no Depressed mood: yes    07/06/2023    4:14 PM 06/22/2023   10:39 AM 06/15/2023    4:20 PM 06/07/2023   10:31 AM 04/30/2023    9:48 AM  Depression screen PHQ 2/9  Decreased Interest 3 1 3 3  0  Down, Depressed, Hopeless 3 2 3 3  0  PHQ - 2 Score 6 3 6 6  0  Altered sleeping 3 0 1 3 3   Tired, decreased energy 3 1 2 3 1   Change in appetite 3 1 3 3 1   Feeling bad or failure about yourself  3 3 3 3  0  Trouble concentrating 3 0 3 2 0  Moving slowly or fidgety/restless 2 1 0 3 0  Suicidal thoughts 2 2 1   0  PHQ-9 Score 25 11 19 23 5   Difficult doing work/chores Extremely dIfficult Somewhat difficult Extremely dIfficult Extremely dIfficult Not difficult at all      07/06/2023    4:14 PM 06/22/2023   10:41 AM 06/15/2023    4:21 PM 06/07/2023   10:31 AM  GAD 7 : Generalized Anxiety Score  Nervous, Anxious, on Edge 3 2 3 3   Control/stop worrying 3 1 3 3   Worry too much - different things 3 1 3 3   Trouble relaxing 3 1 3 2   Restless 3 1 1 2   Easily annoyed or irritable 2 0 3 1  Afraid - awful might happen 3 3 3 3   Total GAD 7 Score 20 9 19 17   Anxiety Difficulty Extremely difficult Not difficult at all Extremely difficult Extremely difficult   Anhedonia: yes Weight changes: no Insomnia: yes  hard to fall asleep  Hypersomnia: no Fatigue/loss of energy: yes Feelings of worthlessness: yes Feelings of guilt: yes Impaired concentration/indecisiveness: yes Suicidal ideations: yes  Crying spells: yes Recent Stressors/Life Changes: no   Relationship problems: no   Family stress: no     Financial stress: no    Job stress: no    Recent death/loss: no  TOE PAIN Duration: couple of days Involved foot: L pinky toe Mechanism of injury: slammed it in a door Location: L pinky toe Onset: sudden  Severity: mild  Quality:  aching Frequency: intermittent Radiation: no Aggravating factors: wearing shoes  Alleviating factors: not wearing shoes  Status: stable Treatments attempted: rest, ice, APAP, and ibuprofen  Relief with NSAIDs?:  No NSAIDs Taken Weakness with weight bearing or walking: no Morning stiffness: no Swelling: yes Redness: no Bruising: yes Paresthesias / decreased sensation: no  Fevers:no   Relevant past medical, surgical, family and social history reviewed and updated as indicated. Interim medical history since our last visit reviewed. Allergies and medications reviewed and updated.  Review of Systems  Constitutional: Negative.   Respiratory:  Negative.    Cardiovascular: Negative.   Musculoskeletal:  Positive for arthralgias. Negative for back pain, gait problem, joint swelling, myalgias, neck pain and neck stiffness.  Skin:  Positive for color change. Negative for pallor, rash and wound.  Psychiatric/Behavioral: Negative.      Per HPI unless specifically indicated above     Objective:    BP 117/67   Pulse 76   LMP 06/22/2023   SpO2 98%   Wt Readings from Last 3 Encounters:  07/01/23 276 lb 1.6 oz (125.2 kg)  04/30/23 278 lb (126.1 kg)  02/25/23 282 lb (127.9 kg)    Physical Exam Vitals and nursing note reviewed.  Constitutional:      General: She is not in acute distress.    Appearance: Normal appearance. She is not ill-appearing,  toxic-appearing or diaphoretic.  HENT:     Head: Normocephalic and atraumatic.     Right Ear: External ear normal.     Left Ear: External ear normal.     Nose: Nose normal.     Mouth/Throat:     Mouth: Mucous membranes are moist.     Pharynx: Oropharynx is clear.  Eyes:     General: No scleral icterus.       Right eye: No discharge.        Left eye: No discharge.     Extraocular Movements: Extraocular movements intact.     Conjunctiva/sclera: Conjunctivae normal.     Pupils: Pupils are equal, round, and reactive to light.  Cardiovascular:     Rate and Rhythm: Normal rate and regular rhythm.     Pulses: Normal pulses.     Heart sounds: Normal heart sounds. No murmur heard.    No friction rub. No gallop.  Pulmonary:     Effort: Pulmonary effort is normal. No respiratory distress.     Breath sounds: Normal breath sounds. No stridor. No wheezing, rhonchi or rales.  Chest:     Chest wall: No tenderness.  Musculoskeletal:        General: Normal range of motion.     Cervical back: Normal range of motion and neck supple.  Skin:    General: Skin is warm and dry.     Capillary Refill: Capillary refill takes less than 2 seconds.     Coloration: Skin is not jaundiced or pale.     Findings: No bruising, erythema, lesion or rash.  Neurological:     General: No focal deficit present.     Mental Status: She is alert and oriented to person, place, and time. Mental status is at baseline.  Psychiatric:        Mood and Affect: Mood normal.        Behavior: Behavior normal.        Thought Content: Thought content normal.        Judgment: Judgment normal.     Results for orders placed or performed in visit on 06/29/23  Folate  Result Value Ref Range   Folate 8.9 >5.9 ng/mL  Vitamin B12  Result Value Ref Range   Vitamin B-12 203 180 - 914 pg/mL  Ferritin  Result Value Ref Range   Ferritin 8 (L) 11 - 307 ng/mL  Iron and TIBC  Result Value Ref Range   Iron 42 28 - 170 ug/dL   TIBC  161 096 - 045 ug/dL   Saturation Ratios 10 (L) 10.4 - 31.8 %   UIBC 371 ug/dL  CBC with Differential (Cancer Center Only)  Result Value Ref Range   WBC Count 10.1 4.0 - 10.5 K/uL   RBC 4.69 3.87 - 5.11 MIL/uL   Hemoglobin 13.0 12.0 - 15.0 g/dL   HCT 16.1 09.6 - 04.5 %   MCV 87.2 80.0 - 100.0 fL   MCH 27.7 26.0 - 34.0 pg   MCHC 31.8 30.0 - 36.0 g/dL   RDW 40.9 81.1 - 91.4 %   Platelet Count 307 150 - 400 K/uL   nRBC 0.0 0.0 - 0.2 %   Neutrophils Relative % 64 %   Neutro Abs 6.5 1.7 - 7.7 K/uL   Lymphocytes Relative 26 %   Lymphs Abs 2.6 0.7 - 4.0 K/uL   Monocytes Relative 6 %   Monocytes Absolute 0.6 0.1 - 1.0 K/uL   Eosinophils Relative 3 %   Eosinophils Absolute 0.3 0.0 - 0.5 K/uL   Basophils Relative 1 %   Basophils Absolute 0.1 0.0 - 0.1 K/uL   Immature Granulocytes 0 %   Abs Immature Granulocytes 0.04 0.00 - 0.07 K/uL      Assessment & Plan:   Problem List Items Addressed This Visit       Other   Severe episode of recurrent major depressive disorder, without psychotic features (HCC) - Primary    Continue out of work. Will recheck in 1 week. Start rexaulti. Continue to follow with counseling. Call with any concerns.       Other Visit Diagnoses     Pain in left toe(s)       Discussed buddytaping. If not improving, will get x-ray. Await results if needed. Call with any concerns.   Relevant Orders   DG Toe 5th Left   Needs flu shot       Flu shot given today.   Relevant Orders   Flu vaccine trivalent PF, 6mos and older(Flulaval,Afluria,Fluarix,Fluzone) (Completed)   Need for COVID-19 vaccine       COVID shot given today.   Relevant Orders   Pfizer Comirnaty Covid -19 Vaccine 90yrs and older (Completed)        Follow up plan: Return in about 1 week (around 07/13/2023).

## 2023-07-07 MED FILL — Dexamethasone Sodium Phosphate Inj 100 MG/10ML: INTRAMUSCULAR | Qty: 1 | Status: AC

## 2023-07-08 ENCOUNTER — Inpatient Hospital Stay: Payer: BC Managed Care – PPO

## 2023-07-08 VITALS — BP 111/56 | HR 48 | Temp 98.3°F | Resp 18

## 2023-07-08 DIAGNOSIS — D509 Iron deficiency anemia, unspecified: Secondary | ICD-10-CM

## 2023-07-08 DIAGNOSIS — E538 Deficiency of other specified B group vitamins: Secondary | ICD-10-CM

## 2023-07-08 MED ORDER — SODIUM CHLORIDE 0.9 % IV SOLN
10.0000 mg | Freq: Once | INTRAVENOUS | Status: AC
  Administered 2023-07-08: 10 mg via INTRAVENOUS
  Filled 2023-07-08: qty 10

## 2023-07-08 MED ORDER — DIPHENHYDRAMINE HCL 25 MG PO CAPS
50.0000 mg | ORAL_CAPSULE | Freq: Once | ORAL | Status: AC
  Administered 2023-07-08: 50 mg via ORAL
  Filled 2023-07-08: qty 2

## 2023-07-08 MED ORDER — SODIUM CHLORIDE 0.9 % IV SOLN
200.0000 mg | Freq: Once | INTRAVENOUS | Status: AC
  Administered 2023-07-08: 200 mg via INTRAVENOUS
  Filled 2023-07-08: qty 200

## 2023-07-08 MED ORDER — SODIUM CHLORIDE 0.9 % IV SOLN
INTRAVENOUS | Status: DC
  Filled 2023-07-08: qty 250

## 2023-07-08 MED ORDER — ACETAMINOPHEN 325 MG PO TABS
650.0000 mg | ORAL_TABLET | Freq: Once | ORAL | Status: AC
  Administered 2023-07-08: 650 mg via ORAL
  Filled 2023-07-08: qty 2

## 2023-07-08 NOTE — Progress Notes (Signed)
Pt has been educated and understands. Pt declined to stay 30 mins after iron infusion. HR in 40s but no symptoms.

## 2023-07-10 NOTE — Assessment & Plan Note (Signed)
Continue out of work. Will recheck in 1 week. Start rexaulti. Continue to follow with counseling. Call with any concerns.

## 2023-07-12 ENCOUNTER — Encounter: Payer: Self-pay | Admitting: Family Medicine

## 2023-07-13 ENCOUNTER — Ambulatory Visit
Admission: RE | Admit: 2023-07-13 | Discharge: 2023-07-13 | Disposition: A | Discharge status: Home / Self Care | Payer: BC Managed Care – PPO | Attending: Family Medicine | Admitting: Family Medicine

## 2023-07-13 ENCOUNTER — Ambulatory Visit (INDEPENDENT_AMBULATORY_CARE_PROVIDER_SITE_OTHER): Payer: BC Managed Care – PPO | Admitting: Family Medicine

## 2023-07-13 ENCOUNTER — Ambulatory Visit
Admission: RE | Admit: 2023-07-13 | Discharge: 2023-07-13 | Disposition: A | Discharge status: Home / Self Care | Payer: BC Managed Care – PPO | Source: Ambulatory Visit | Attending: Family Medicine | Admitting: Family Medicine

## 2023-07-13 ENCOUNTER — Encounter: Payer: Self-pay | Admitting: Family Medicine

## 2023-07-13 VITALS — BP 129/67 | HR 86

## 2023-07-13 DIAGNOSIS — M545 Low back pain, unspecified: Secondary | ICD-10-CM

## 2023-07-13 DIAGNOSIS — F332 Major depressive disorder, recurrent severe without psychotic features: Secondary | ICD-10-CM | POA: Diagnosis not present

## 2023-07-13 NOTE — Progress Notes (Signed)
BP 129/67   Pulse 86   LMP 06/22/2023   SpO2 98%    Subjective:    Patient ID: Sharon Gibson, female    DOB: [birth year], 42 y.o.   MRN: [id number]  HPI: Sharon Gibson is a 42 y.o. female  Chief Complaint  Patient presents with   Depression   DEPRESSION- started the rexaulti about a week ago. It's an adjustment, woke up wide awake in the middle of the night a couple of days (had prednisone and 2 vaccines) Mood status:  slightly better Satisfied with current treatment?: no Symptom severity: mild  Duration of current treatment : chronic Side effects: no Medication compliance: excellent compliance Psychotherapy/counseling: yes current Previous psychiatric medications: numerous Depressed mood: yes Anxious mood: yes Anhedonia: yes Significant weight loss or gain: no Insomnia: no  Fatigue: yes Feelings of worthlessness or guilt: yes Impaired concentration/indecisiveness: no Suicidal ideations: yes Hopelessness: no Crying spells: no    07/13/2023   10:40 AM 07/06/2023    4:14 PM 06/22/2023   10:39 AM 06/15/2023    4:20 PM 06/07/2023   10:31 AM  Depression screen PHQ 2/9  Decreased Interest 1 3 1 3 3   Down, Depressed, Hopeless 2 3 2 3 3   PHQ - 2 Score 3 6 3 6 6   Altered sleeping 3 3 0 1 3  Tired, decreased energy 1 3 1 2 3   Change in appetite 3 3 1 3 3   Feeling bad or failure about yourself  3 3 3 3 3   Trouble concentrating 2 3 0 3 2  Moving slowly or fidgety/restless 2 2 1  0 3  Suicidal thoughts 2 2 2 1    PHQ-9 Score 19 25 11 19 23   Difficult doing work/chores Extremely dIfficult Extremely dIfficult Somewhat difficult Extremely dIfficult Extremely dIfficult      07/13/2023   10:40 AM 07/06/2023    4:14 PM 06/22/2023   10:41 AM 06/15/2023    4:21 PM  GAD 7 : Generalized Anxiety Score  Nervous, Anxious, on Edge 3 3 2 3   Control/stop worrying 3 3 1 3   Worry too much - different things 3 3 1 3   Trouble relaxing 3 3 1 3   Restless 3 3 1 1   Easily annoyed or  irritable 3 2 0 3  Afraid - awful might happen 3 3 3 3   Total GAD 7 Score 21 20 9 19   Anxiety Difficulty Extremely difficult Extremely difficult Not difficult at all Extremely difficult   BACK PAIN-  went to urgent care for back pain, did PT and diagnosed with lower cross syndrome Duration: about 6- 8 weeks ago  Mechanism of injury: unknown Location: low back Onset: sudden Severity: severe Quality: sharp and shooting Frequency: intermittent Radiation: buttocks Aggravating factors: rolling over, standing, working out Alleviating factors: doing her exericses Status: better Treatments attempted: rest, ice, heat, APAP, ibuprofen, aleve, physical therapy, and HEP  Relief with NSAIDs?: mild Nighttime pain:  no Paresthesias / decreased sensation:  no Bowel / bladder incontinence:  no Fevers:  no Dysuria / urinary frequency:  no    Relevant past medical, surgical, family and social history reviewed and updated as indicated. Interim medical history since our last visit reviewed. Allergies and medications reviewed and updated.  Review of Systems  Constitutional: Negative.   Respiratory: Negative.    Cardiovascular: Negative.   Gastrointestinal: Negative.   Musculoskeletal: Negative.   Neurological: Negative.   Psychiatric/Behavioral: Negative.      Per HPI unless specifically indicated above  Objective:    BP 129/67   Pulse 86   LMP 06/22/2023   SpO2 98%   Wt Readings from Last 3 Encounters:  07/01/23 276 lb 1.6 oz (125.2 kg)  04/30/23 278 lb (126.1 kg)  02/25/23 282 lb (127.9 kg)    Physical Exam Vitals and nursing note reviewed.  Constitutional:      General: She is not in acute distress.    Appearance: Normal appearance. She is not ill-appearing, toxic-appearing or diaphoretic.  HENT:     Head: Normocephalic and atraumatic.     Right Ear: External ear normal.     Left Ear: External ear normal.     Nose: Nose normal.     Mouth/Throat:     Mouth: Mucous  membranes are moist.     Pharynx: Oropharynx is clear.  Eyes:     General: No scleral icterus.       Right eye: No discharge.        Left eye: No discharge.     Extraocular Movements: Extraocular movements intact.     Conjunctiva/sclera: Conjunctivae normal.     Pupils: Pupils are equal, round, and reactive to light.  Cardiovascular:     Rate and Rhythm: Normal rate and regular rhythm.     Pulses: Normal pulses.     Heart sounds: Normal heart sounds. No murmur heard.    No friction rub. No gallop.  Pulmonary:     Effort: Pulmonary effort is normal. No respiratory distress.     Breath sounds: Normal breath sounds. No stridor. No wheezing, rhonchi or rales.  Chest:     Chest wall: No tenderness.  Musculoskeletal:        General: Normal range of motion.     Cervical back: Normal range of motion and neck supple.  Skin:    General: Skin is warm and dry.     Capillary Refill: Capillary refill takes less than 2 seconds.     Coloration: Skin is not jaundiced or pale.     Findings: No bruising, erythema, lesion or rash.  Neurological:     General: No focal deficit present.     Mental Status: She is alert and oriented to person, place, and time. Mental status is at baseline.  Psychiatric:        Mood and Affect: Mood normal.        Behavior: Behavior normal.        Thought Content: Thought content normal.        Judgment: Judgment normal.     Results for orders placed or performed in visit on 06/29/23  Folate  Result Value Ref Range   Folate 8.9 >5.9 ng/mL  Vitamin B12  Result Value Ref Range   Vitamin B-12 203 180 - 914 pg/mL  Ferritin  Result Value Ref Range   Ferritin 8 (L) 11 - 307 ng/mL  Iron and TIBC  Result Value Ref Range   Iron 42 28 - 170 ug/dL   TIBC 272 536 - 644 ug/dL   Saturation Ratios 10 (L) 10.4 - 31.8 %   UIBC 371 ug/dL  CBC with Differential (Cancer Center Only)  Result Value Ref Range   WBC Count 10.1 4.0 - 10.5 K/uL   RBC 4.69 3.87 - 5.11 MIL/uL    Hemoglobin 13.0 12.0 - 15.0 g/dL   HCT 03.4 74.2 - 59.5 %   MCV 87.2 80.0 - 100.0 fL   MCH 27.7 26.0 - 34.0 pg   MCHC 31.8 30.0 -  36.0 g/dL   RDW 16.1 09.6 - 04.5 %   Platelet Count 307 150 - 400 K/uL   nRBC 0.0 0.0 - 0.2 %   Neutrophils Relative % 64 %   Neutro Abs 6.5 1.7 - 7.7 K/uL   Lymphocytes Relative 26 %   Lymphs Abs 2.6 0.7 - 4.0 K/uL   Monocytes Relative 6 %   Monocytes Absolute 0.6 0.1 - 1.0 K/uL   Eosinophils Relative 3 %   Eosinophils Absolute 0.3 0.0 - 0.5 K/uL   Basophils Relative 1 %   Basophils Absolute 0.1 0.0 - 0.1 K/uL   Immature Granulocytes 0 %   Abs Immature Granulocytes 0.04 0.00 - 0.07 K/uL      Assessment & Plan:   Problem List Items Addressed This Visit       Other   Severe episode of recurrent major depressive disorder, without psychotic features (HCC) - Primary    Slightly improved with some jitteriness ? From rexaulti or from prednisone/covid shot. Will continue titration up and recheck 1 week. Continue to follow with counselor. Call with any concerns.       Other Visit Diagnoses     Acute midline low back pain without sciatica       Will send for x-rays and treat with voltaren. Consider referral to sports med based on results.   Relevant Orders   DG Lumbar Spine Complete        Follow up plan: Return in about 1 week (around 07/20/2023) for virtual OK.

## 2023-07-13 NOTE — Assessment & Plan Note (Signed)
Slightly improved with some jitteriness ? From rexaulti or from prednisone/covid shot. Will continue titration up and recheck 1 week. Continue to follow with counselor. Call with any concerns.

## 2023-07-14 MED FILL — Dexamethasone Sodium Phosphate Inj 100 MG/10ML: INTRAMUSCULAR | Qty: 1 | Status: AC

## 2023-07-15 ENCOUNTER — Inpatient Hospital Stay: Payer: BC Managed Care – PPO

## 2023-07-15 VITALS — BP 105/60 | HR 45 | Temp 96.8°F | Resp 18

## 2023-07-15 DIAGNOSIS — D509 Iron deficiency anemia, unspecified: Secondary | ICD-10-CM

## 2023-07-15 MED ORDER — DIPHENHYDRAMINE HCL 25 MG PO CAPS
50.0000 mg | ORAL_CAPSULE | Freq: Once | ORAL | Status: AC
  Administered 2023-07-15: 50 mg via ORAL
  Filled 2023-07-15: qty 2

## 2023-07-15 MED ORDER — ACETAMINOPHEN 325 MG PO TABS
650.0000 mg | ORAL_TABLET | Freq: Once | ORAL | Status: AC
  Administered 2023-07-15: 650 mg via ORAL
  Filled 2023-07-15: qty 2

## 2023-07-15 MED ORDER — SODIUM CHLORIDE 0.9 % IV SOLN
10.0000 mg | Freq: Once | INTRAVENOUS | Status: AC
  Administered 2023-07-15: 10 mg via INTRAVENOUS
  Filled 2023-07-15: qty 10

## 2023-07-15 MED ORDER — SODIUM CHLORIDE 0.9 % IV SOLN
INTRAVENOUS | Status: DC
  Filled 2023-07-15: qty 250

## 2023-07-15 MED ORDER — SODIUM CHLORIDE 0.9 % IV SOLN
200.0000 mg | Freq: Once | INTRAVENOUS | Status: AC
  Administered 2023-07-15: 200 mg via INTRAVENOUS
  Filled 2023-07-15: qty 200

## 2023-07-15 NOTE — Progress Notes (Signed)
Declined post-observation. Aware of risks. Vitals stable at discharge.

## 2023-07-15 NOTE — Patient Instructions (Signed)
Iron Sucrose Injection What is this medication? IRON SUCROSE (EYE ern SOO krose) treats low levels of iron (iron deficiency anemia) in people with kidney disease. Iron is a mineral that plays an important role in making red blood cells, which carry oxygen from your lungs to the rest of your body. This medicine may be used for other purposes; ask your health care provider or pharmacist if you have questions. COMMON BRAND NAME(S): Venofer What should I tell my care team before I take this medication? They need to know if you have any of these conditions: Anemia not caused by low iron levels Heart disease High levels of iron in the blood Kidney disease Liver disease An unusual or allergic reaction to iron, other medications, foods, dyes, or preservatives Pregnant or trying to get pregnant Breastfeeding How should I use this medication? This medication is for infusion into a vein. It is given in a hospital or clinic setting. Talk to your care team about the use of this medication in children. While this medication may be prescribed for children as young as 2 years for selected conditions, precautions do apply. Overdosage: If you think you have taken too much of this medicine contact a poison control center or emergency room at once. NOTE: This medicine is only for you. Do not share this medicine with others. What if I miss a dose? Keep appointments for follow-up doses. It is important not to miss your dose. Call your care team if you are unable to keep an appointment. What may interact with this medication? Do not take this medication with any of the following: Deferoxamine Dimercaprol Other iron products This medication may also interact with the following: Chloramphenicol Deferasirox This list may not describe all possible interactions. Give your health care provider a list of all the medicines, herbs, non-prescription drugs, or dietary supplements you use. Also tell them if you smoke,  drink alcohol, or use illegal drugs. Some items may interact with your medicine. What should I watch for while using this medication? Visit your care team regularly. Tell your care team if your symptoms do not start to get better or if they get worse. You may need blood work done while you are taking this medication. You may need to follow a special diet. Talk to your care team. Foods that contain iron include: whole grains/cereals, dried fruits, beans, or peas, leafy green vegetables, and organ meats (liver, kidney). What side effects may I notice from receiving this medication? Side effects that you should report to your care team as soon as possible: Allergic reactions--skin rash, itching, hives, swelling of the face, lips, tongue, or throat Low blood pressure--dizziness, feeling faint or lightheaded, blurry vision Shortness of breath Side effects that usually do not require medical attention (report to your care team if they continue or are bothersome): Flushing Headache Joint pain Muscle pain Nausea Pain, redness, or irritation at injection site This list may not describe all possible side effects. Call your doctor for medical advice about side effects. You may report side effects to FDA at 1-800-FDA-1088. Where should I keep my medication? This medication is given in a hospital or clinic. It will not be stored at home. NOTE: This sheet is a summary. It may not cover all possible information. If you have questions about this medicine, talk to your doctor, pharmacist, or health care provider.  2024 Elsevier/Gold Standard (2023-02-19 00:00:00)

## 2023-07-20 ENCOUNTER — Telehealth (INDEPENDENT_AMBULATORY_CARE_PROVIDER_SITE_OTHER): Payer: BC Managed Care – PPO | Admitting: Family Medicine

## 2023-07-20 ENCOUNTER — Encounter: Payer: Self-pay | Admitting: Family Medicine

## 2023-07-20 DIAGNOSIS — M545 Low back pain, unspecified: Secondary | ICD-10-CM | POA: Diagnosis not present

## 2023-07-20 DIAGNOSIS — F332 Major depressive disorder, recurrent severe without psychotic features: Secondary | ICD-10-CM

## 2023-07-20 NOTE — Assessment & Plan Note (Signed)
Slowly improving with her medicine. Continue titration up of rexaulti. Follow up 1 week. Continue to work with Veterinary surgeon. Call with any concerns.

## 2023-07-20 NOTE — Progress Notes (Signed)
LMP 07/13/2023    Subjective:    Patient ID: Sharon Gibson, female    DOB: Sep 20, 1981, 42 y.o.   MRN: [id number]  HPI: Sharon Gibson is a 42 y.o. female  Chief Complaint  Patient presents with   Depression   Back Pain    Patient says she would like to follow up on a referral placed last week for her Back. Patient says she still has not heard from anyone.    DEPRESSION- started on the rexaulti about 2 weeks ago- feeling better.  Mood status: better Satisfied with current treatment?: no Symptom severity: moderate  Duration of current treatment : months Side effects: no Medication compliance: excellent compliance Psychotherapy/counseling: yes current Depressed mood: yes Anxious mood: yes Anhedonia: no Significant weight loss or gain: no Insomnia: yes  Fatigue: yes Feelings of worthlessness or guilt: no Impaired concentration/indecisiveness: no Suicidal ideations: yes Hopelessness: no Crying spells: no    07/20/2023    3:54 PM 07/13/2023   10:40 AM 07/06/2023    4:14 PM 06/22/2023   10:39 AM 06/15/2023    4:20 PM  Depression screen PHQ 2/9  Decreased Interest 1 1 3 1 3   Down, Depressed, Hopeless 1 2 3 2 3   PHQ - 2 Score 2 3 6 3 6   Altered sleeping 2 3 3  0 1  Tired, decreased energy 1 1 3 1 2   Change in appetite 3 3 3 1 3   Feeling bad or failure about yourself  3 3 3 3 3   Trouble concentrating 2 2 3  0 3  Moving slowly or fidgety/restless 1 2 2 1  0  Suicidal thoughts 2 2 2 2 1   PHQ-9 Score 16 19 25 11 19   Difficult doing work/chores Very difficult Extremely dIfficult Extremely dIfficult Somewhat difficult Extremely dIfficult      07/20/2023    3:55 PM 07/13/2023   10:40 AM 07/06/2023    4:14 PM 06/22/2023   10:41 AM  GAD 7 : Generalized Anxiety Score  Nervous, Anxious, on Edge 3 3 3 2   Control/stop worrying 3 3 3 1   Worry too much - different things 3 3 3 1   Trouble relaxing 3 3 3 1   Restless 3 3 3 1   Easily annoyed or irritable 1 3 2  0  Afraid -  awful might happen 3 3 3 3   Total GAD 7 Score 19 21 20 9   Anxiety Difficulty Extremely difficult Extremely difficult Extremely difficult Not difficult at all    Relevant past medical, surgical, family and social history reviewed and updated as indicated. Interim medical history since our last visit reviewed. Allergies and medications reviewed and updated.  Review of Systems  Constitutional: Negative.   Respiratory: Negative.    Cardiovascular: Negative.   Gastrointestinal: Negative.   Musculoskeletal:  Positive for back pain and myalgias. Negative for arthralgias, gait problem, joint swelling, neck pain and neck stiffness.  Skin: Negative.   Psychiatric/Behavioral: Negative.      Per HPI unless specifically indicated above     Objective:    LMP 07/13/2023   Wt Readings from Last 3 Encounters:  07/01/23 276 lb 1.6 oz (125.2 kg)  04/30/23 278 lb (126.1 kg)  02/25/23 282 lb (127.9 kg)    Physical Exam Vitals and nursing note reviewed.  Constitutional:      General: She is not in acute distress.    Appearance: Normal appearance. She is not ill-appearing, toxic-appearing or diaphoretic.  HENT:     Head: Normocephalic and atraumatic.  Right Ear: External ear normal.     Left Ear: External ear normal.     Nose: Nose normal.     Mouth/Throat:     Mouth: Mucous membranes are moist.     Pharynx: Oropharynx is clear.  Eyes:     General: No scleral icterus.       Right eye: No discharge.        Left eye: No discharge.     Conjunctiva/sclera: Conjunctivae normal.     Pupils: Pupils are equal, round, and reactive to light.  Pulmonary:     Effort: Pulmonary effort is normal. No respiratory distress.     Comments: Speaking in full sentences Musculoskeletal:        General: Normal range of motion.     Cervical back: Normal range of motion.  Skin:    Coloration: Skin is not jaundiced or pale.     Findings: No bruising, erythema, lesion or rash.  Neurological:     Mental  Status: She is alert and oriented to person, place, and time. Mental status is at baseline.  Psychiatric:        Mood and Affect: Mood normal.        Behavior: Behavior normal.        Thought Content: Thought content normal.        Judgment: Judgment normal.     Results for orders placed or performed in visit on 06/29/23  Folate  Result Value Ref Range   Folate 8.9 >5.9 ng/mL  Vitamin B12  Result Value Ref Range   Vitamin B-12 203 180 - 914 pg/mL  Ferritin  Result Value Ref Range   Ferritin 8 (L) 11 - 307 ng/mL  Iron and TIBC  Result Value Ref Range   Iron 42 28 - 170 ug/dL   TIBC 161 096 - 045 ug/dL   Saturation Ratios 10 (L) 10.4 - 31.8 %   UIBC 371 ug/dL  CBC with Differential (Cancer Center Only)  Result Value Ref Range   WBC Count 10.1 4.0 - 10.5 K/uL   RBC 4.69 3.87 - 5.11 MIL/uL   Hemoglobin 13.0 12.0 - 15.0 g/dL   HCT 40.9 81.1 - 91.4 %   MCV 87.2 80.0 - 100.0 fL   MCH 27.7 26.0 - 34.0 pg   MCHC 31.8 30.0 - 36.0 g/dL   RDW 78.2 95.6 - 21.3 %   Platelet Count 307 150 - 400 K/uL   nRBC 0.0 0.0 - 0.2 %   Neutrophils Relative % 64 %   Neutro Abs 6.5 1.7 - 7.7 K/uL   Lymphocytes Relative 26 %   Lymphs Abs 2.6 0.7 - 4.0 K/uL   Monocytes Relative 6 %   Monocytes Absolute 0.6 0.1 - 1.0 K/uL   Eosinophils Relative 3 %   Eosinophils Absolute 0.3 0.0 - 0.5 K/uL   Basophils Relative 1 %   Basophils Absolute 0.1 0.0 - 0.1 K/uL   Immature Granulocytes 0 %   Abs Immature Granulocytes 0.04 0.00 - 0.07 K/uL      Assessment & Plan:   Problem List Items Addressed This Visit       Other   Severe episode of recurrent major depressive disorder, without psychotic features (HCC) - Primary    Slowly improving with her medicine. Continue titration up of rexaulti. Follow up 1 week. Continue to work with Veterinary surgeon. Call with any concerns.       Other Visit Diagnoses     Acute midline low back  pain without sciatica       Not significantly better. X-ray pending. Will get  her into sports med. Referral generated today.   Relevant Orders   Ambulatory referral to Sports Medicine        Follow up plan: Return in about 9 days (around 07/29/2023).    This visit was completed via video visit through MyChart due to the restrictions of the COVID-19 pandemic. All issues as above were discussed and addressed. Physical exam was done as above through visual confirmation on video through MyChart. If it was felt that the patient should be evaluated in the office, they were directed there. The patient verbally consented to this visit. Location of the patient: home Location of the provider: work Those involved with this call:  Provider: Olevia Perches, DO CMA: Malen Gauze, CMA Front Desk/Registration:  Servando Snare   Time spent on call:  15 minutes with patient face to face via video conference. More than 50% of this time was spent in counseling and coordination of care. 23 minutes total spent in review of patient's record and preparation of their chart.

## 2023-07-21 NOTE — Progress Notes (Signed)
Appointment has been made.

## 2023-07-22 ENCOUNTER — Ambulatory Visit: Payer: BC Managed Care – PPO | Admitting: Family Medicine

## 2023-07-22 ENCOUNTER — Encounter: Payer: Self-pay | Admitting: Family Medicine

## 2023-07-22 ENCOUNTER — Other Ambulatory Visit: Payer: Self-pay | Admitting: Family Medicine

## 2023-07-22 VITALS — BP 128/74 | HR 78 | Ht 63.0 in | Wt 271.0 lb

## 2023-07-22 DIAGNOSIS — M4306 Spondylolysis, lumbar region: Secondary | ICD-10-CM | POA: Insufficient documentation

## 2023-07-22 DIAGNOSIS — M4307 Spondylolysis, lumbosacral region: Secondary | ICD-10-CM

## 2023-07-22 DIAGNOSIS — F332 Major depressive disorder, recurrent severe without psychotic features: Secondary | ICD-10-CM

## 2023-07-22 MED ORDER — CELECOXIB 100 MG PO CAPS: 100.0000 mg | ORAL_CAPSULE | Freq: Two times a day (BID) | ORAL | 0 refills | Status: DC | PRN

## 2023-07-22 MED ORDER — TIZANIDINE HCL 4 MG PO TABS: 4.0000 mg | ORAL_TABLET | Freq: Every evening | ORAL | 1 refills | Status: DC | PRN

## 2023-07-22 NOTE — Assessment & Plan Note (Signed)
Patient presents with 27-month, atraumatic lumbosacral pain, left sided without radiation distally, some radiation to the mid back, aggravated by standing, resolved by sitting/deep flexion at the hips, no paresthesias.  She has modified activity and performed extensive PT that identified lower cross syndrome which she has been actively working on, however persistent lumbosacral pain noted.  Of note, she is on gabapentin 100 mg long-term for separate right upper extremity issue.  Has been on tizanidine through her PCP Dr. Olevia Perches which has been beneficial.  Examination with limited range of motion during straight leg raise on the left when compared to contralateral, negative straight leg raise, Pearlean Brownie and FADIR on the left localized to the lower lumbar left region, nontender throughout, positive stork's test bilaterally localizing left lower lumbar sacral area.  Patient's clinical history and x-ray findings are most consistent with spondylolysis related symptomatology and most likely an element of left neural impingement.  Plan: - MRI lumbar spine without contrast ordered, referral coordinator will contact for scheduling - Referral to interventional spine group placed - Trial Celebrex, maximum of twice daily, take with food (if any stomach/GI symptoms occur, stop medication and contact our office - Gradually increase gabapentin, to a maximum of 4 capsules nightly (400 mg), take lowest amount that allows for symptom control and tolerability - Can continue tizanidine nightly as needed - Our office will contact with MRI results, contact us for any questions/concerns

## 2023-07-22 NOTE — Patient Instructions (Signed)
-   MRI lumbar spine without contrast ordered, referral coordinator will contact for scheduling - Referral to interventional spine group placed - Trial Celebrex, maximum of twice daily, take with food (if any stomach/GI symptoms occur, stop medication and contact our office - Gradually increase gabapentin, to a maximum of 4 capsules nightly (400 mg), take lowest amount that allows for symptom control and tolerability - Can continue tizanidine nightly as needed - Our office will contact with MRI results, contact us for any questions/concerns

## 2023-07-22 NOTE — Progress Notes (Signed)
Primary Care / Sports Medicine Office Visit  Patient Information:  Patient ID: Sharon Gibson, female DOB: 1981-09-01 Age: 42 y.o. MRN: [id number]   Sharon Gibson is a pleasant 42 y.o. female presenting with the following:  Chief Complaint  Patient presents with   Back Pain    Lower back pain. Started 3 months ago. Patient did PT. Patient was diagnosed with lower cross syndrome. Patient said her pain has not gotten better. Pain just sitting is a 5. Standing for any period of time causes pain to be at a 10. Patient has had Xrays of this last week. Patient also seeing massage therapist and this helps some.     Vitals:   07/22/23 0923  BP: 128/74  Pulse: 78  SpO2: 97%   Vitals:   07/22/23 0923  Weight: 271 lb (122.9 kg)  Height: 5\' 3"  (1.6 m)   Body mass index is 48.01 kg/m.  DG Lumbar Spine Complete  Result Date: 07/21/2023 CLINICAL DATA:  Hello EXAM: LUMBAR SPINE - COMPLETE 5 VIEW COMPARISON:  01/08/2022. FINDINGS: No compression deformities. No osteolytic or osteoblastic lesions. There is grade 1 L5 anterolisthesis and bilateral L5 spondylolysis which is a stable finding compared to the prior study. IMPRESSION: L5 spondylolysis and grade 1 anterolisthesis. No acute osseous abnormalities. Electronically Signed   By: Layla Maw M.D.   On: 07/21/2023 19:56     Independent interpretation of notes and tests performed by another provider:   Independent interpretation of recent lumbar spine x-rays demonstrates degenerative changes with intervertebral space narrowing at L1-L2, associated anterior endplate osteophytes, there is grade 1 anterolisthesis of L5 on S1 and associated spondylolysis at the same level, uncertain acuity  Procedures performed:   None  Pertinent History, Exam, Impression, and Recommendations:   Problem List Items Addressed This Visit       Musculoskeletal and Integument   Lumbosacral spondylolysis - Primary    Patient presents with  59-month, atraumatic lumbosacral pain, left sided without radiation distally, some radiation to the mid back, aggravated by standing, resolved by sitting/deep flexion at the hips, no paresthesias.  She has modified activity and performed extensive PT that identified lower cross syndrome which she has been actively working on, however persistent lumbosacral pain noted.  Of note, she is on gabapentin 100 mg long-term for separate right upper extremity issue.  Has been on tizanidine through her PCP Dr. Olevia Perches which has been beneficial.  Examination with limited range of motion during straight leg raise on the left when compared to contralateral, negative straight leg raise, Pearlean Brownie and FADIR on the left localized to the lower lumbar left region, nontender throughout, positive stork's test bilaterally localizing left lower lumbar sacral area.  Patient's clinical history and x-ray findings are most consistent with spondylolysis related symptomatology and most likely an element of left neural impingement.  Plan: - MRI lumbar spine without contrast ordered, referral coordinator will contact for scheduling - Referral to interventional spine group placed - Trial Celebrex, maximum of twice daily, take with food (if any stomach/GI symptoms occur, stop medication and contact our office - Gradually increase gabapentin, to a maximum of 4 capsules nightly (400 mg), take lowest amount that allows for symptom control and tolerability - Can continue tizanidine nightly as needed - Our office will contact with MRI results, contact us for any questions/concerns      Relevant Orders   MR Lumbar Spine Wo Contrast   Ambulatory referral to Anesthesiology     Orders &  Medications Medications:  Meds ordered this encounter  Medications   tiZANidine (ZANAFLEX) 4 MG tablet    Sig: Take 1 tablet (4 mg total) by mouth at bedtime as needed for muscle spasms.    Dispense:  30 tablet    Refill:  1   celecoxib  (CELEBREX) 100 MG capsule    Sig: Take 1 capsule (100 mg total) by mouth 2 (two) times daily as needed.    Dispense:  60 capsule    Refill:  0   Orders Placed This Encounter  Procedures   MR Lumbar Spine Wo Contrast   Ambulatory referral to Anesthesiology     No follow-ups on file.     Jerrol Banana, MD, [facility]   Primary Care Sports Medicine Primary Care and Sports Medicine at [facility]

## 2023-07-23 ENCOUNTER — Encounter: Payer: Self-pay | Admitting: Family Medicine

## 2023-07-23 NOTE — Telephone Encounter (Signed)
Please review.  KP

## 2023-07-27 ENCOUNTER — Ambulatory Visit
Admission: RE | Admit: 2023-07-27 | Discharge: 2023-07-27 | Disposition: A | Discharge status: Home / Self Care | Payer: BC Managed Care – PPO | Source: Ambulatory Visit | Attending: Family Medicine | Admitting: Family Medicine

## 2023-07-27 DIAGNOSIS — M4307 Spondylolysis, lumbosacral region: Secondary | ICD-10-CM | POA: Diagnosis present

## 2023-07-30 ENCOUNTER — Encounter: Payer: Self-pay | Admitting: Family Medicine

## 2023-07-30 ENCOUNTER — Telehealth (INDEPENDENT_AMBULATORY_CARE_PROVIDER_SITE_OTHER): Payer: BC Managed Care – PPO | Admitting: Family Medicine

## 2023-07-30 DIAGNOSIS — F332 Major depressive disorder, recurrent severe without psychotic features: Secondary | ICD-10-CM

## 2023-07-30 MED ORDER — BREXPIPRAZOLE 1 MG PO TABS: 1.0000 mg | ORAL_TABLET | Freq: Every day | ORAL | 1 refills | Status: AC

## 2023-07-30 MED ORDER — GABAPENTIN 100 MG PO CAPS: 100.0000 mg | ORAL_CAPSULE | Freq: Every day | ORAL | 1 refills | Status: DC

## 2023-07-30 NOTE — Patient Instructions (Addendum)
Take 1.25 of the rexaulti for 1 week Next week 1.5 of the rexaulti In 2 weeks 1.75 of the rexaulti

## 2023-07-30 NOTE — Progress Notes (Unsigned)
LMP 07/13/2023    Subjective:    Patient ID: Sharon Gibson, female    DOB: May 10, 1981, 42 y.o.   MRN: [id number]  HPI: Sharon Gibson is a 42 y.o. female  Chief Complaint  Patient presents with  . Depression  . Medication Management    Patient says she would like to discuss Gabapentin prescription and requesting a new prescription at today's visit.    DEPRESSION Mood status: {Blank single:19197::"controlled","uncontrolled","better","worse","exacerbated","stable"} Satisfied with current treatment?: {Blank single:19197::"yes","no"} Symptom severity: {Blank single:19197::"mild","moderate","severe"}  Duration of current treatment : {Blank single:19197::"chronic","months","years"} Side effects: {Blank single:19197::"yes","no"} Medication compliance: {Blank single:19197::"excellent compliance","good compliance","fair compliance","poor compliance"} Psychotherapy/counseling: {Blank single:19197::"yes","no"} {Blank single:19197::"current","in the past"} Previous psychiatric medications: {Blank multiple:19196::"abilify","amitryptiline","buspar","celexa","cymbalta","depakote","effexor","lamictal","lexapro","lithium","nortryptiline","paxil","prozac","pristiq (desvenlafaxine","seroquel","wellbutrin","zoloft","zyprexa"} Depressed mood: {Blank single:19197::"yes","no"} Anxious mood: {Blank single:19197::"yes","no"} Anhedonia: {Blank single:19197::"yes","no"} Significant weight loss or gain: {Blank single:19197::"yes","no"} Insomnia: {Blank single:19197::"yes","no"} {Blank single:19197::"hard to fall asleep","hard to stay asleep"} Fatigue: {Blank single:19197::"yes","no"} Feelings of worthlessness or guilt: {Blank single:19197::"yes","no"} Impaired concentration/indecisiveness: {Blank single:19197::"yes","no"} Suicidal ideations: {Blank single:19197::"yes","no"} Hopelessness: {Blank single:19197::"yes","no"} Crying spells: {Blank single:19197::"yes","no"}    07/30/2023   10:36 AM  07/20/2023    3:54 PM 07/13/2023   10:40 AM 07/06/2023    4:14 PM 06/22/2023   10:39 AM  Depression screen PHQ 2/9  Decreased Interest 1 1 1 3 1   Down, Depressed, Hopeless 2 1 2 3 2   PHQ - 2 Score 3 2 3 6 3   Altered sleeping 1 2 3 3  0  Tired, decreased energy 1 1 1 3 1   Change in appetite 1 3 3 3 1   Feeling bad or failure about yourself  3 3 3 3 3   Trouble concentrating 1 2 2 3  0  Moving slowly or fidgety/restless 1 1 2 2 1   Suicidal thoughts 2 2 2 2 2   PHQ-9 Score 13 16 19 25 11   Difficult doing work/chores Very difficult Very difficult Extremely dIfficult Extremely dIfficult Somewhat difficult    Relevant past medical, surgical, family and social history reviewed and updated as indicated. Interim medical history since our last visit reviewed. Allergies and medications reviewed and updated.  Review of Systems  Constitutional: Negative.   Respiratory: Negative.    Cardiovascular: Negative.   Gastrointestinal: Negative.   Musculoskeletal:  Positive for back pain and myalgias. Negative for arthralgias, gait problem, joint swelling, neck pain and neck stiffness.  Psychiatric/Behavioral:  Positive for dysphoric mood. Negative for agitation, behavioral problems, confusion, decreased concentration, hallucinations, self-injury, sleep disturbance and suicidal ideas. The patient is nervous/anxious. The patient is not hyperactive.     Per HPI unless specifically indicated above     Objective:    LMP 07/13/2023   Wt Readings from Last 3 Encounters:  07/22/23 271 lb (122.9 kg)  07/01/23 276 lb 1.6 oz (125.2 kg)  04/30/23 278 lb (126.1 kg)    Physical Exam  Results for orders placed or performed in visit on 06/29/23  Folate  Result Value Ref Range   Folate 8.9 >5.9 ng/mL  Vitamin B12  Result Value Ref Range   Vitamin B-12 203 180 - 914 pg/mL  Ferritin  Result Value Ref Range   Ferritin 8 (L) 11 - 307 ng/mL  Iron and TIBC  Result Value Ref Range   Iron 42 28 - 170 ug/dL    TIBC 295 284 - 132 ug/dL   Saturation Ratios 10 (L) 10.4 - 31.8 %   UIBC 371 ug/dL  CBC with Differential (Cancer Center Only)  Result Value Ref Range   WBC Count 10.1 4.0 - 10.5 K/uL  RBC 4.69 3.87 - 5.11 MIL/uL   Hemoglobin 13.0 12.0 - 15.0 g/dL   HCT 04.5 40.9 - 81.1 %   MCV 87.2 80.0 - 100.0 fL   MCH 27.7 26.0 - 34.0 pg   MCHC 31.8 30.0 - 36.0 g/dL   RDW 91.4 78.2 - 95.6 %   Platelet Count 307 150 - 400 K/uL   nRBC 0.0 0.0 - 0.2 %   Neutrophils Relative % 64 %   Neutro Abs 6.5 1.7 - 7.7 K/uL   Lymphocytes Relative 26 %   Lymphs Abs 2.6 0.7 - 4.0 K/uL   Monocytes Relative 6 %   Monocytes Absolute 0.6 0.1 - 1.0 K/uL   Eosinophils Relative 3 %   Eosinophils Absolute 0.3 0.0 - 0.5 K/uL   Basophils Relative 1 %   Basophils Absolute 0.1 0.0 - 0.1 K/uL   Immature Granulocytes 0 %   Abs Immature Granulocytes 0.04 0.00 - 0.07 K/uL      Assessment & Plan:   Problem List Items Addressed This Visit   None    Follow up plan: No follow-ups on file.

## 2023-08-01 ENCOUNTER — Encounter: Payer: Self-pay | Admitting: Family Medicine

## 2023-08-01 NOTE — Assessment & Plan Note (Addendum)
Continues to improve. Will return to work on Monday. Will increase her rexaulti slowly. Will continue to monitor closely. Call with any concerns. Recheck 1 week.

## 2023-08-02 NOTE — Progress Notes (Signed)
Called and scheduled patient on 08/06/2023 @ 4:00 pm.

## 2023-08-04 ENCOUNTER — Encounter: Payer: Self-pay | Admitting: Family Medicine

## 2023-08-06 ENCOUNTER — Encounter: Payer: Self-pay | Admitting: Family Medicine

## 2023-08-06 ENCOUNTER — Telehealth: Payer: BC Managed Care – PPO | Admitting: Family Medicine

## 2023-08-06 DIAGNOSIS — F411 Generalized anxiety disorder: Secondary | ICD-10-CM

## 2023-08-06 DIAGNOSIS — M4726 Other spondylosis with radiculopathy, lumbar region: Secondary | ICD-10-CM | POA: Diagnosis not present

## 2023-08-06 DIAGNOSIS — N83202 Unspecified ovarian cyst, left side: Secondary | ICD-10-CM | POA: Diagnosis not present

## 2023-08-06 DIAGNOSIS — F332 Major depressive disorder, recurrent severe without psychotic features: Secondary | ICD-10-CM | POA: Diagnosis not present

## 2023-08-06 DIAGNOSIS — M5416 Radiculopathy, lumbar region: Secondary | ICD-10-CM

## 2023-08-06 MED ORDER — TIZANIDINE HCL 4 MG PO TABS: 4.0000 mg | ORAL_TABLET | Freq: Every evening | ORAL | 1 refills | Status: DC | PRN

## 2023-08-06 MED ORDER — GABAPENTIN 300 MG PO CAPS: 600.0000 mg | ORAL_CAPSULE | Freq: Every day | ORAL | 3 refills | Status: DC

## 2023-08-06 MED ORDER — CLONAZEPAM 0.5 MG PO TABS: 0.5000 mg | ORAL_TABLET | Freq: Every day | ORAL | 0 refills | Status: DC | PRN

## 2023-08-06 MED ORDER — BREXPIPRAZOLE 2 MG PO TABS: 2.0000 mg | ORAL_TABLET | Freq: Every day | ORAL | 2 refills | Status: DC

## 2023-08-06 NOTE — Assessment & Plan Note (Signed)
Will increase her gabapentin to 600mg  and increase her tizanidine to 8mg . Continue to follow with sports med and await consult with anesthesiology.

## 2023-08-06 NOTE — Progress Notes (Unsigned)
LMP 07/13/2023    Subjective:    Patient ID: Sharon Gibson, female    DOB: [birth year], 42 y.o.   MRN: [id number]  HPI: [name] is a 42 y.o. female  Chief Complaint  Patient presents with  . Depression   DEPRESSION Mood status: {Blank single:19197::"controlled","uncontrolled","better","worse","exacerbated","stable"} Satisfied with current treatment?: {Blank single:19197::"yes","no"} Symptom severity: {Blank single:19197::"mild","moderate","severe"}  Duration of current treatment : {Blank single:19197::"chronic","months","years"} Side effects: {Blank single:19197::"yes","no"} Medication compliance: {Blank single:19197::"excellent compliance","good compliance","fair compliance","poor compliance"} Psychotherapy/counseling: {Blank single:19197::"yes","no"} {Blank single:19197::"current","in the past"} Previous psychiatric medications: {Blank multiple:19196::"abilify","amitryptiline","buspar","celexa","cymbalta","depakote","effexor","lamictal","lexapro","lithium","nortryptiline","paxil","prozac","pristiq (desvenlafaxine","seroquel","wellbutrin","zoloft","zyprexa"} Depressed mood: {Blank single:19197::"yes","no"} Anxious mood: {Blank single:19197::"yes","no"} Anhedonia: {Blank single:19197::"yes","no"} Significant weight loss or gain: {Blank single:19197::"yes","no"} Insomnia: {Blank single:19197::"yes","no"} {Blank single:19197::"hard to fall asleep","hard to stay asleep"} Fatigue: {Blank single:19197::"yes","no"} Feelings of worthlessness or guilt: {Blank single:19197::"yes","no"} Impaired concentration/indecisiveness: {Blank single:19197::"yes","no"} Suicidal ideations: {Blank single:19197::"yes","no"} Hopelessness: {Blank single:19197::"yes","no"} Crying spells: {Blank single:19197::"yes","no"}    08/06/2023    3:38 PM 07/30/2023   10:36 AM 07/20/2023    3:54 PM 07/13/2023   10:40 AM 07/06/2023    4:14 PM  Depression screen PHQ 2/9  Decreased Interest 3 1 1 1 3    Down, Depressed, Hopeless 3 2 1 2 3   PHQ - 2 Score 6 3 2 3 6   Altered sleeping 3 1 2 3 3   Tired, decreased energy 3 1 1 1 3   Change in appetite 2 1 3 3 3   Feeling bad or failure about yourself  3 3 3 3 3   Trouble concentrating 3 1 2 2 3   Moving slowly or fidgety/restless 1 1 1 2 2   Suicidal thoughts 3 2 2 2 2   PHQ-9 Score 24 13 16 19 25   Difficult doing work/chores Extremely dIfficult Very difficult Very difficult Extremely dIfficult Extremely dIfficult      08/06/2023    3:39 PM 07/30/2023   10:38 AM 07/20/2023    3:55 PM 07/13/2023   10:40 AM  GAD 7 : Generalized Anxiety Score  Nervous, Anxious, on Edge 3 3 3 3   Control/stop worrying 3 3 3 3   Worry too much - different things 3 3 3 3   Trouble relaxing 3 3 3 3   Restless 3 3 3 3   Easily annoyed or irritable 3 3 1 3   Afraid - awful might happen 3 3 3 3   Total GAD 7 Score 21 21 19 21   Anxiety Difficulty Extremely difficult Extremely difficult Extremely difficult Extremely difficult    Relevant past medical, surgical, family and social history reviewed and updated as indicated. Interim medical history since our last visit reviewed. Allergies and medications reviewed and updated.  Review of Systems  Per HPI unless specifically indicated above     Objective:    LMP 07/13/2023   Wt Readings from Last 3 Encounters:  07/22/23 271 lb (122.9 kg)  07/01/23 276 lb 1.6 oz (125.2 kg)  04/30/23 278 lb (126.1 kg)    Physical Exam  Results for orders placed or performed in visit on 06/29/23  Folate  Result Value Ref Range   Folate 8.9 >5.9 ng/mL  Vitamin B12  Result Value Ref Range   Vitamin B-12 203 180 - 914 pg/mL  Ferritin  Result Value Ref Range   Ferritin 8 (L) 11 - 307 ng/mL  Iron and TIBC  Result Value Ref Range   Iron 42 28 - 170 ug/dL   TIBC 578 469 - 629 ug/dL   Saturation Ratios 10 (L) 10.4 - 31.8 %   UIBC 371 ug/dL  CBC with Differential (Cancer Center  Only)  Result Value Ref Range   WBC Count 10.1 4.0 -  10.5 K/uL   RBC 4.69 3.87 - 5.11 MIL/uL   Hemoglobin 13.0 12.0 - 15.0 g/dL   HCT 62.1 30.8 - 65.7 %   MCV 87.2 80.0 - 100.0 fL   MCH 27.7 26.0 - 34.0 pg   MCHC 31.8 30.0 - 36.0 g/dL   RDW 84.6 96.2 - 95.2 %   Platelet Count 307 150 - 400 K/uL   nRBC 0.0 0.0 - 0.2 %   Neutrophils Relative % 64 %   Neutro Abs 6.5 1.7 - 7.7 K/uL   Lymphocytes Relative 26 %   Lymphs Abs 2.6 0.7 - 4.0 K/uL   Monocytes Relative 6 %   Monocytes Absolute 0.6 0.1 - 1.0 K/uL   Eosinophils Relative 3 %   Eosinophils Absolute 0.3 0.0 - 0.5 K/uL   Basophils Relative 1 %   Basophils Absolute 0.1 0.0 - 0.1 K/uL   Immature Granulocytes 0 %   Abs Immature Granulocytes 0.04 0.00 - 0.07 K/uL      Assessment & Plan:   Problem List Items Addressed This Visit   None    Follow up plan: No follow-ups on file.

## 2023-08-07 NOTE — Assessment & Plan Note (Signed)
Will get her set up for [place] and referral to GYN. Await results.

## 2023-08-07 NOTE — Assessment & Plan Note (Signed)
See discussion under depression.  ?

## 2023-08-07 NOTE — Assessment & Plan Note (Signed)
Not under good control. Will continue to titrate up on rexaulti and recheck in 2 weeks. Refill of klonopin given today. Call with any concerns.

## 2023-08-09 NOTE — Progress Notes (Signed)
Called and scheduled patient on 08/19/2023 @ 10:40 am.

## 2023-08-10 ENCOUNTER — Encounter: Payer: Self-pay | Admitting: Family Medicine

## 2023-08-10 NOTE — Telephone Encounter (Signed)
Please review.  KP

## 2023-08-12 ENCOUNTER — Ambulatory Visit: Payer: Self-pay

## 2023-08-12 ENCOUNTER — Ambulatory Visit: Payer: BC Managed Care – PPO | Admitting: Family Medicine

## 2023-08-12 ENCOUNTER — Ambulatory Visit: Payer: Self-pay | Admitting: *Deleted

## 2023-08-12 ENCOUNTER — Encounter: Payer: Self-pay | Admitting: Family Medicine

## 2023-08-12 VITALS — BP 113/61 | HR 71

## 2023-08-12 DIAGNOSIS — F332 Major depressive disorder, recurrent severe without psychotic features: Secondary | ICD-10-CM | POA: Diagnosis not present

## 2023-08-12 NOTE — Telephone Encounter (Signed)
Reason for Disposition  Patient sounds very upset or troubled to the triager    Mother, Bonita Quin calling back in.   See earlier triage note from this morning where she spoke with another triage nurse.   Urgent message has been sent.  Answer Assessment - Initial Assessment Questions 1. CONCERN: "Did anything happen that prompted you to call today?"      Sharon Gibson, mother (on [place]) calling back in.   She called in this morning before the office opened regarding her daughter, Sharon Gibson having anxiety this morning.   See triage note from Neldon Newport, RN from earlier this morning. She is wanting to talk with Dr. Laural Benes regarding Sharon Gibson's anxiety.   I let Bonita Quin know a high priority message was sent by Verlon Au to Dr. Laural Benes we are waiting for Dr. Henriette Combs response.   She is in seeing pts at the moment.   Linda asked to speak with someone directly in the office so I put her on hold and called into the office.   I spoke with Herbert Seta who said she could not tell Bonita Quin anything different than what I'd already told her.   I let Heather know Bonita Quin wanted to speak with someone directly in the office which is why I was calling in.   Heather let me know Dr. Laural Benes was in seeing pts and could not talk with Bonita Quin at this time.   Dr. Laural Benes would give her a call.   I thanked [place] and got back on the line with [employer].   I let her know I spoke with Herbert Seta and that Dr. Laural Benes was in with a pt at the moment but that the message had been sent to Dr. Laural Benes.    Bonita Quin made the comment,  "That's why I called so early so Dr. Laural Benes would call me first thing this morning".   I let Bonita Quin know she starts seeing pts at 8:00 but that her message was sent high priority. I let Bonita Quin know if Jaryn was in crisis or felt she needed help now that she could go to the ED.   Linda didn't want to take her there.   "They don't do anything for her there".   "Dr. Laural Benes is familiar with her and has made some medication adjustments so  going to the ED would not help."  Again I let Bonita Quin know Dr. Laural Benes was made aware and the message was sent high priority plus I've spoken with Herbert Seta so she should be hearing back from Dr. Laural Benes.   Bonita Quin thanked me.    2. ANXIETY SYMPTOMS: "Can you describe how you (your loved one; patient) have been feeling?" (e.g., tense, restless, panicky, anxious, keyed up, overwhelmed, sense of impending doom).      Bonita Quin concerned about Imagean having anxiety in light of recent medication changes. 3. ONSET: "How long have you been feeling this way?" (e.g., hours, days, weeks)     Not asked since she was triaged earlier this morning.   (See earlier triage note) 4. SEVERITY: "How would you rate the level of anxiety?" (e.g., 0 - 10; or mild, moderate, severe).      5. FUNCTIONAL IMPAIRMENT: "How have these feelings affected your ability to do daily activities?" "Have you had more difficulty than usual doing your normal daily activities?" (e.g., getting better, same, worse; self-care, school, work, interactions)      6. HISTORY: "Have you felt this way before?" "Have you ever been diagnosed with an anxiety problem in the  past?" (e.g., generalized anxiety disorder, panic attacks, PTSD). If Yes, ask: "How was this problem treated?" (e.g., medicines, counseling, etc.)      7. RISK OF HARM - SUICIDAL IDEATION: "Do you ever have thoughts of hurting or killing yourself?" If Yes, ask:  "Do you have these feelings now?" "Do you have a plan on how you would do this?"      8. TREATMENT:  "What has been done so far to treat this anxiety?" (e.g., medicines, relaxation strategies). "What has helped?"      9. TREATMENT - THERAPIST: "Do you have a counselor or therapist? Name?"      10. POTENTIAL TRIGGERS: "Do you drink caffeinated beverages (e.g., coffee, colas, teas), and how much daily?" "Do you drink alcohol or use any drugs?" "Have you started any new medicines recently?"        11. PATIENT SUPPORT: "Who is with you  now?" "Who do you live with?" "Do you have family or friends who you can talk to?"         12. OTHER SYMPTOMS: "Do you have any other symptoms?" (e.g., feeling depressed, trouble concentrating, trouble sleeping, trouble breathing, palpitations or fast heartbeat, chest pain, sweating, nausea, or diarrhea)        13. PREGNANCY: "Is there any chance you are pregnant?" "When was your last menstrual period?"  Protocols used: Anxiety and Panic Attack-A-AH

## 2023-08-12 NOTE — Telephone Encounter (Signed)
Just an FYI....Marland KitchenMarland KitchenPt has an appointment today with Dr. Laural Benes @ 4:00 pm.

## 2023-08-12 NOTE — Telephone Encounter (Signed)
  Chief Complaint: Pt's mother, Wakeelah Bryner calling back in regarding Codee's anxiety.  (See earlier triage note from this morning).   She is calling back in because she has not heard from Dr. Laural Benes.   See my triage notes for details of the call. Symptoms: Ashlinn is having anxiety and Bonita Quin is wondering if it's related to a recent medication change Dr. Laural Benes made. Frequency: This morning Pertinent Negatives: Patient denies see earlier triage note Disposition: [] ED /[] Urgent Care (no appt availability in office) / [] Appointment(In office/virtual)/ []  [facility]/ [] Home Care/ [] Refused Recommended Disposition /[] [facility]/ [x]  Follow-up with PCP Additional Notes: I have sent a message letting Dr. Laural Benes know Bonita Quin has called back in wanting to speak with Dr. Laural Benes regarding Vollie's anxiety.  (See earlier triage note from this morning).

## 2023-08-12 NOTE — Telephone Encounter (Signed)
Notified Heather in the practice of triage and request for appointment.

## 2023-08-12 NOTE — Telephone Encounter (Addendum)
     Chief Complaint: Pt.'s Mom reports pt. Is very anxious, tearful. Has PTSD. Related to work. Her dog is also sick, contributing to her anxiety. Will only see Dr. Laural Benes. Mom concerned pt. May have thoughts of self harm. "I'm not sure." Requests to be worked in with PCP . Symptoms: Above Frequency: This week. Pertinent Negatives: Patient denies  Disposition: [] ED /[] Urgent Care (no appt availability in office) / [] Appointment(In office/virtual)/ []  [facility]/ [] Home Care/ [] Refused Recommended Disposition /[] [facility]/ [x]  Follow-up with PCP Additional Notes: Mom states she will not leave pt. Alone. Please advise Mom.  Reason for Disposition  Patient sounds very upset or troubled to the triager  Answer Assessment - Initial Assessment Questions 1. CONCERN: "Did anything happen that prompted you to call today?"      Anxiety 2. ANXIETY SYMPTOMS: "Can you describe how you (your loved one; patient) have been feeling?" (e.g., tense, restless, panicky, anxious, keyed up, overwhelmed, sense of impending doom).      Anxious/PTSD 3. ONSET: "How long have you been feeling this way?" (e.g., hours, days, weeks)     This week 4. SEVERITY: "How would you rate the level of anxiety?" (e.g., 0 - 10; or mild, moderate, severe).     Severe 5. FUNCTIONAL IMPAIRMENT: "How have these feelings affected your ability to do daily activities?" "Have you had more difficulty than usual doing your normal daily activities?" (e.g., getting better, same, worse; self-care, school, work, interactions)     Tearful 6. HISTORY: "Have you felt this way before?" "Have you ever been diagnosed with an anxiety problem in the past?" (e.g., generalized anxiety disorder, panic attacks, PTSD). If Yes, ask: "How was this problem treated?" (e.g., medicines, counseling, etc.)     Yes, medicines 7. RISK OF HARM - SUICIDAL IDEATION: "Do you ever have thoughts of hurting or killing yourself?" If Yes, ask:  "Do  you have these feelings now?" "Do you have a plan on how you would do this?"     Yes 8. TREATMENT:  "What has been done so far to treat this anxiety?" (e.g., medicines, relaxation strategies). "What has helped?"     Medication 9. TREATMENT - THERAPIST: "Do you have a counselor or therapist? Name?"     Yes 10. POTENTIAL TRIGGERS: "Do you drink caffeinated beverages (e.g., coffee, colas, teas), and how much daily?" "Do you drink alcohol or use any drugs?" "Have you started any new medicines recently?"       Work 11. PATIENT SUPPORT: "Who is with you now?" "Who do you live with?" "Do you have family or friends who you can talk to?"        Mother 40. OTHER SYMPTOMS: "Do you have any other symptoms?" (e.g., feeling depressed, trouble concentrating, trouble sleeping, trouble breathing, palpitations or fast heartbeat, chest pain, sweating, nausea, or diarrhea)       Trouble sleeping  13. PREGNANCY: "Is there any chance you are pregnant?" "When was your last menstrual period?"       N/a  Protocols used: Anxiety and Panic Attack-A-AH

## 2023-08-12 NOTE — Progress Notes (Signed)
BP 113/61   Pulse 71   LMP 07/13/2023   SpO2 98%    Subjective:    Patient ID: Sharon Gibson, female    DOB: 25-Oct-1980, 42 y.o.   MRN: [id number]  HPI: Sharon Gibson is a 42 y.o. female  Chief Complaint  Patient presents with   Anxiety    Patient says she is a "mess." Patient says she is having a very difficult time of wanting to be alive. Patient says she thinks the election and returning to work and her line of work is effected. Patient says she is having severe panic attacks and gets herself worked up into chest tightness.    DEPRESSION Mood status: exacerbated Satisfied with current treatment?: no Symptom severity: severe  Duration of current treatment : months Side effects: no Medication compliance: excellent compliance Psychotherapy/counseling: yes current Depressed mood: yes Anxious mood: yes Anhedonia: yes Significant weight loss or gain: no Insomnia: yes hard to fall asleep Fatigue: yes Feelings of worthlessness or guilt: yes Impaired concentration/indecisiveness: yes Suicidal ideations: yes Hopelessness: yes Crying spells: yes    08/12/2023    4:00 PM 08/06/2023    3:38 PM 07/30/2023   10:36 AM 07/20/2023    3:54 PM 07/13/2023   10:40 AM  Depression screen PHQ 2/9  Decreased Interest 3 3 1 1 1   Down, Depressed, Hopeless 3 3 2 1 2   PHQ - 2 Score 6 6 3 2 3   Altered sleeping 3 3 1 2 3   Tired, decreased energy 3 3 1 1 1   Change in appetite 3 2 1 3 3   Feeling bad or failure about yourself  3 3 3 3 3   Trouble concentrating 3 3 1 2 2   Moving slowly or fidgety/restless 2 1 1 1 2   Suicidal thoughts 3 3 2 2 2   PHQ-9 Score 26 24 13 16 19   Difficult doing work/chores Extremely dIfficult Extremely dIfficult Very difficult Very difficult Extremely dIfficult    Relevant past medical, surgical, family and social history reviewed and updated as indicated. Interim medical history since our last visit reviewed. Allergies and medications reviewed and  updated.  Review of Systems  Constitutional: Negative.   Respiratory: Negative.    Cardiovascular: Negative.   Gastrointestinal: Negative.   Musculoskeletal: Negative.   Psychiatric/Behavioral:  Positive for agitation, dysphoric mood, sleep disturbance and suicidal ideas. Negative for behavioral problems, confusion, decreased concentration, hallucinations and self-injury. The patient is nervous/anxious. The patient is not hyperactive.     Per HPI unless specifically indicated above     Objective:    BP 113/61   Pulse 71   LMP 07/13/2023   SpO2 98%   Wt Readings from Last 3 Encounters:  07/22/23 271 lb (122.9 kg)  07/01/23 276 lb 1.6 oz (125.2 kg)  04/30/23 278 lb (126.1 kg)    Physical Exam Vitals and nursing note reviewed.  Constitutional:      General: She is not in acute distress.    Appearance: Normal appearance. She is not ill-appearing, toxic-appearing or diaphoretic.  HENT:     Head: Normocephalic and atraumatic.     Right Ear: External ear normal.     Left Ear: External ear normal.     Nose: Nose normal.     Mouth/Throat:     Mouth: Mucous membranes are moist.     Pharynx: Oropharynx is clear.  Eyes:     General: No scleral icterus.       Right eye: No discharge.  Left eye: No discharge.     Extraocular Movements: Extraocular movements intact.     Conjunctiva/sclera: Conjunctivae normal.     Pupils: Pupils are equal, round, and reactive to light.  Cardiovascular:     Rate and Rhythm: Normal rate and regular rhythm.     Pulses: Normal pulses.     Heart sounds: Normal heart sounds. No murmur heard.    No friction rub. No gallop.  Pulmonary:     Effort: Pulmonary effort is normal. No respiratory distress.     Breath sounds: Normal breath sounds. No stridor. No wheezing, rhonchi or rales.  Chest:     Chest wall: No tenderness.  Musculoskeletal:        General: Normal range of motion.     Cervical back: Normal range of motion and neck supple.   Skin:    General: Skin is warm and dry.     Capillary Refill: Capillary refill takes less than 2 seconds.     Coloration: Skin is not jaundiced or pale.     Findings: No bruising, erythema, lesion or rash.  Neurological:     General: No focal deficit present.     Mental Status: She is alert and oriented to person, place, and time. Mental status is at baseline.  Psychiatric:        Mood and Affect: Mood is depressed. Affect is tearful.        Behavior: Behavior normal.        Thought Content: Thought content is not paranoid or delusional. Thought content includes suicidal (States that she feels safe now, just doesn't want to be here) ideation. Thought content does not include homicidal ideation. Thought content does not include homicidal or suicidal plan.        Judgment: Judgment normal.     Results for orders placed or performed in visit on 06/29/23  Folate  Result Value Ref Range   Folate 8.9 >5.9 ng/mL  Vitamin B12  Result Value Ref Range   Vitamin B-12 203 180 - 914 pg/mL  Ferritin  Result Value Ref Range   Ferritin 8 (L) 11 - 307 ng/mL  Iron and TIBC  Result Value Ref Range   Iron 42 28 - 170 ug/dL   TIBC 952 841 - 324 ug/dL   Saturation Ratios 10 (L) 10.4 - 31.8 %   UIBC 371 ug/dL  CBC with Differential ([facility] Center Only)  Result Value Ref Range   WBC Count 10.1 4.0 - 10.5 K/uL   RBC 4.69 3.87 - 5.11 MIL/uL   Hemoglobin 13.0 12.0 - 15.0 g/dL   HCT 40.1 02.7 - 25.3 %   MCV 87.2 80.0 - 100.0 fL   MCH 27.7 26.0 - 34.0 pg   MCHC 31.8 30.0 - 36.0 g/dL   RDW 66.4 40.3 - 47.4 %   Platelet Count 307 150 - 400 K/uL   nRBC 0.0 0.0 - 0.2 %   Neutrophils Relative % 64 %   Neutro Abs 6.5 1.7 - 7.7 K/uL   Lymphocytes Relative 26 %   Lymphs Abs 2.6 0.7 - 4.0 K/uL   Monocytes Relative 6 %   Monocytes Absolute 0.6 0.1 - 1.0 K/uL   Eosinophils Relative 3 %   Eosinophils Absolute 0.3 0.0 - 0.5 K/uL   Basophils Relative 1 %   Basophils Absolute 0.1 0.0 - 0.1 K/uL    Immature Granulocytes 0 %   Abs Immature Granulocytes 0.04 0.00 - 0.07 K/uL      Assessment &  Plan:   Problem List Items Addressed This Visit       Other   Severe episode of recurrent major depressive disorder, without psychotic features (HCC) - Primary    Not doing well. Mood significantly worse. Will get her set up with partial hospitalization program and keep her out of work until she has started that. Continue titration up on her rexaulti. Recheck 1 week.        Follow up plan: No follow-ups on file.  >30 minutes spent with patient and her Mom today

## 2023-08-12 NOTE — Assessment & Plan Note (Signed)
Not doing well. Mood significantly worse. Will get her set up with partial hospitalization program and keep her out of work until she has started that. Continue titration up on her rexaulti. Recheck 1 week.

## 2023-08-12 NOTE — Telephone Encounter (Signed)
Pt has an appointment with provider today @ 4:00 pm.

## 2023-08-13 ENCOUNTER — Ambulatory Visit
Admission: RE | Admit: 2023-08-13 | Discharge: 2023-08-13 | Disposition: A | Discharge status: Home / Self Care | Payer: BC Managed Care – PPO | Source: Ambulatory Visit | Attending: Family Medicine | Admitting: Family Medicine

## 2023-08-13 DIAGNOSIS — N83202 Unspecified ovarian cyst, left side: Secondary | ICD-10-CM | POA: Insufficient documentation

## 2023-08-16 ENCOUNTER — Telehealth: Payer: Self-pay | Admitting: Family Medicine

## 2023-08-16 NOTE — Telephone Encounter (Signed)
Patient dropped off document  Certification of Health Care Provider Form , to be filled out by provider. Patient requested to send it back via Call Patient to pick up within 5-days. Document is located in providers tray at front office.Please advise at Mobile [phone] (mobile)

## 2023-08-17 NOTE — Telephone Encounter (Signed)
Patient ha been called to pick up paperwork

## 2023-08-18 ENCOUNTER — Other Ambulatory Visit: Payer: Self-pay | Admitting: Family Medicine

## 2023-08-18 ENCOUNTER — Ambulatory Visit
Payer: BC Managed Care – PPO | Attending: Student in an Organized Health Care Education/Training Program | Admitting: Student in an Organized Health Care Education/Training Program

## 2023-08-18 ENCOUNTER — Encounter: Payer: Self-pay | Admitting: Student in an Organized Health Care Education/Training Program

## 2023-08-18 VITALS — BP 133/71 | HR 61 | Temp 97.2°F | Resp 16 | Ht 63.0 in | Wt 271.0 lb

## 2023-08-18 DIAGNOSIS — G8929 Other chronic pain: Secondary | ICD-10-CM | POA: Diagnosis present

## 2023-08-18 DIAGNOSIS — M4317 Spondylolisthesis, lumbosacral region: Secondary | ICD-10-CM | POA: Insufficient documentation

## 2023-08-18 DIAGNOSIS — M533 Sacrococcygeal disorders, not elsewhere classified: Secondary | ICD-10-CM | POA: Insufficient documentation

## 2023-08-18 DIAGNOSIS — M4306 Spondylolysis, lumbar region: Secondary | ICD-10-CM | POA: Insufficient documentation

## 2023-08-18 DIAGNOSIS — M5416 Radiculopathy, lumbar region: Secondary | ICD-10-CM | POA: Diagnosis present

## 2023-08-18 NOTE — Patient Instructions (Signed)

## 2023-08-18 NOTE — Progress Notes (Signed)
Safety precautions to be maintained throughout the outpatient stay will include: orient to surroundings, keep bed in low position, maintain call bell within reach at all times, provide assistance with transfer out of bed and ambulation.  

## 2023-08-18 NOTE — Progress Notes (Signed)
Patient: Sharon Gibson  Service Category: E/M  Provider: Edward Jolly, MD  DOB: 12-Oct-1980  DOS: 08/18/2023  Referring Provider: Jerrol Banana, MD  MRN: [id number]  Setting: Ambulatory outpatient  PCP: Dorcas Carrow, DO  Type: New Patient  Specialty: Interventional Pain Management    Location: Office  Delivery: Face-to-face     Primary Reason(s) for Visit: Encounter for initial evaluation of one or more chronic problems (new to examiner) potentially causing chronic pain, and posing a threat to normal musculoskeletal function. (Level of risk: High) CC: Back Pain (Lumbar left )  HPI  Sharon Gibson is a 42 y.o. year old, female patient, who comes for the first time to our practice referred by Jerrol Banana, MD for our initial evaluation of her chronic pain. She has S/P gastric bypass; History of eating disorder; Insomnia; PTSD (post-traumatic stress disorder); Hypothyroidism; Iron deficiency anemia; B12 deficiency; Severe episode of recurrent major depressive disorder, without psychotic features (HCC); Bulimia nervosa, in partial remission, mild; Right knee pain; Generalized anxiety disorder; Hx pulmonary embolism; Ovarian cyst; PCOS (polycystic ovarian syndrome); Endometriosis determined by laparoscopy; Chronic radicular lumbar pain; Vasomotor rhinitis; Idiopathic urticaria; Chronic allergic conjunctivitis; At risk for prolonged QT interval syndrome; Right foot pain; Meniere disease, bilateral; Rosacea; Vitamin D deficiency; Mixed hyperlipidemia; Pars defect of lumbar spine; and Spondylolisthesis at L5-S1 level on their problem list. Today she comes in for evaluation of her Back Pain (Lumbar left )  Pain Assessment: Location: Lower, Right Back Radiating:   Onset: More than a month ago Duration: Chronic pain Quality: Numbness, Constant, Discomfort, Other (Comment) (very intense, makes her want to scream) Severity: 7 /10 (subjective, self-reported pain score)  Effect on ADL: walking  increases the intensity Timing: Constant Modifying factors: movement, changing positions.  exercise and stretching, working with a Systems analyst. BP: 133/71  HR: 61  Onset and Duration: Sudden and Date of onset: 04/2023 Cause of pain: Unknown Severity: No change since onset, NAS-11 at its worse: 10/10, NAS-11 at its best: 1/10, NAS-11 now: 7/10, and NAS-11 on the average: 6/10 Timing: Morning, Afternoon, Night, and After a period of immobility Aggravating Factors: Prolonged sitting and Prolonged standing Alleviating Factors: Sitting, Standing, and twisting Associated Problems: Fatigue, Inability to concentrate, Pain that wakes patient up, and Pain that does not allow patient to sleep Quality of Pain: Aching, Agonizing, Cruel, Deep, Horrible, Sharp, and Toothache-like Previous Examinations or Tests: X-rays and Orthopedic evaluation Previous Treatments: Physical Therapy, Strengthening exercises, and Stretching exercises  Sharon Gibson is being evaluated for possible interventional pain management therapies for the treatment of her chronic pain.   The patient, a physically active individual with a history of regular participation in barre and Pilates, presented with a sudden onset of severe lower back pain that began in late August. The patient could not identify a specific incident that triggered the pain, but noted that it seemed to intensify after a prolonged period of standing and a chair yoga session. The pain was described as excruciating, to the point where the patient felt compelled to squat down while standing in line to alleviate discomfort.  The pain was primarily localized in the left gluteal region, with some extension into the lower back. The patient denied significant pain radiating down the left leg. The patient reported that movement, particularly stretching exercises associated with barre and Pilates, often provided some relief, although there was a fine line between  beneficial movement and exacerbation of pain. Prolonged periods of sitting or standing were identified as triggers  for increased pain.  Initial treatment included a visit to urgent care, where the patient received two injections, believed to be a steroid and Toradol, and was prescribed tizanidine. The patient reported that the tizanidine initially provided some relief, but its effectiveness seemed to diminish over time, even after doubling the dosage. The patient was also referred to physical therapy, where a diagnosis of lower cross syndrome was made.  In response to the diagnosis, the patient began working with a Systems analyst and adjusted her exercise routine to focus on strengthening the core and maintaining a more tucked pelvic position. This approach seemed to provide some improvement, but the patient continued to experience significant pain. The patient also sought relief through massage therapy, where therapists noted that the patient's muscles were extremely tight.  The patient denied any difficulty walking or weakness, but did note a recent episode of numbness in the lower back. The patient was not on any blood thinners and did not have diabetes.  Meds   Current Outpatient Medications:    brexpiprazole (REXULTI) 1 MG TABS tablet, Take 1 tablet (1 mg total) by mouth daily., Disp: 30 tablet, Rfl: 1   brexpiprazole (REXULTI) 2 MG TABS tablet, Take 1 tablet (2 mg total) by mouth daily., Disp: 30 tablet, Rfl: 2   clonazePAM (KLONOPIN) 0.5 MG tablet, Take 1 tablet (0.5 mg total) by mouth daily as needed for anxiety., Disp: 30 tablet, Rfl: 0   gabapentin (NEURONTIN) 300 MG capsule, Take 2 capsules (600 mg total) by mouth at bedtime., Disp: 60 capsule, Rfl: 3   levothyroxine (SYNTHROID) 50 MCG tablet, TAKE 1 TABLET BY MOUTH EVERY DAY, Disp: 90 tablet, Rfl: 0   sertraline (ZOLOFT) 100 MG tablet, TAKE 2.5 TABLETS (250 MG TOTAL) BY MOUTH AS DIRECTED. (Patient taking differently: Take 200 mg by  mouth daily.), Disp: 225 tablet, Rfl: 0   tiZANidine (ZANAFLEX) 4 MG tablet, Take 1-2 tablets (4-8 mg total) by mouth at bedtime as needed for muscle spasms., Disp: 60 tablet, Rfl: 1   Vitamin D, Ergocalciferol, (DRISDOL) 1.25 MG (50000 UNIT) CAPS capsule, Take 1 capsule (50,000 Units total) by mouth every 7 (seven) days., Disp: 12 capsule, Rfl: 1   metroNIDAZOLE (METROGEL) 1 % gel, Apply topically daily. (Patient not taking: Reported on 08/18/2023), Disp: 45 g, Rfl: 3  Imaging Review   DG Cervical Spine Complete  Narrative CLINICAL DATA:  Right shoulder and neck pain  EXAM: CERVICAL SPINE - COMPLETE 4+ VIEW  COMPARISON:  None.  FINDINGS: Straightening of the cervical spine. Mild disc space narrowing at C5-C6. Vertebral body heights are normal. Normal prevertebral soft tissue thickness. The dens and lateral masses are within normal limits.  IMPRESSION: Straightening of the cervical spine with minimal degenerative change at C5-C6   Electronically Signed By: Jasmine Pang M.D. On: 12/11/2019 21:09   DG Shoulder Right  Narrative CLINICAL DATA:  Right shoulder and neck pain  EXAM: RIGHT SHOULDER - 2+ VIEW  COMPARISON:  None.  FINDINGS: There is no evidence of fracture or dislocation. There is no evidence of arthropathy or other focal bone abnormality. Soft tissues are unremarkable.  IMPRESSION: Negative.   Electronically Signed By: Jasmine Pang M.D. On: 12/11/2019 21:09   MR Lumbar Spine Wo Contrast  Narrative CLINICAL DATA:  Low back and left leg pain for 3 months.  EXAM: MRI LUMBAR SPINE WITHOUT CONTRAST  TECHNIQUE: Multiplanar, multisequence MR imaging of the lumbar spine was performed. No intravenous contrast was administered.  COMPARISON:  Radiographs 07/13/2023  FINDINGS: Segmentation: There are five lumbar type vertebral bodies. The last full intervertebral disc space is labeled L5-S1. This correlates with the radiographs.  Alignment:  Bilateral pars defects at L5 with a grade 1 spondylolisthesis. The other lumbar vertebral bodies are normally aligned.  Vertebrae:  Normal marrow signal.  No bone lesions or fractures.  Conus medullaris and cauda equina: Conus extends to the L1-2 level. Conus and cauda equina appear normal.  Paraspinal and other soft tissues: No significant paraspinal or retroperitoneal findings are identified. There is a curvilinear structure in the pelvis left of midline which demonstrates high T1 signal intensity and low T2 signal intensity. Findings could be a hemorrhagic ovarian cyst or possibly hematosalpinx. Pelvic ultrasound may be helpful for further evaluation.  Disc levels:  No significant findings from T12-L1 down through L4-5. No disc protrusions, spinal foraminal stenosis. There is moderate right-sided facet disease noted at L4-5.  L5-S1: Bilateral pars defects with associated grade 1 spondylolisthesis and advanced facet disease, right greater than left. There is a bulging uncovered annulus along with a shallow left paracentral and foraminal disc protrusion. Potential irritation of the left L5 nerve root. No spinal stenosis as the spinal canal is quite generous.  IMPRESSION: 1. Bilateral pars defects at L5 with associated grade 1 spondylolisthesis and advanced facet disease, right greater than left. 2. Bulging uncovered annulus along with a shallow left paracentral and foraminal disc protrusion at L5-S1. Potential irritation of the left L5 nerve root. 3. No significant findings from T12-L1 down through L4-5. 4. Curvilinear structure in the pelvis left of midline which demonstrates high T1 signal intensity and low T2 signal intensity. Findings could be a hemorrhagic ovarian cyst or possibly hematosalpinx. Pelvic ultrasound may be helpful for further evaluation.   Electronically Signed By: Rudie Meyer M.D. On: 08/05/2023 10:04 DG Lumbar Spine  Complete  Narrative CLINICAL DATA:  Hello  EXAM: LUMBAR SPINE - COMPLETE 5 VIEW  COMPARISON:  01/08/2022.  FINDINGS: No compression deformities. No osteolytic or osteoblastic lesions. There is grade 1 L5 anterolisthesis and bilateral L5 spondylolysis which is a stable finding compared to the prior study.  IMPRESSION: L5 spondylolysis and grade 1 anterolisthesis. No acute osseous abnormalities.   Electronically Signed By: Layla Maw M.D. On: 07/21/2023 19:56 DG Foot Complete Right  Narrative CLINICAL DATA:  Right foot pain after injury 5 weeks ago.  EXAM: RIGHT FOOT COMPLETE - 3+ VIEW  COMPARISON:  None Available.  FINDINGS: There is no evidence of fracture or dislocation. There is no evidence of arthropathy or other focal bone abnormality. Soft tissues are unremarkable.  IMPRESSION: Negative.   Electronically Signed By: Lupita Raider M.D. On: 05/07/2022 14:26  Foot-L DG Complete: Results for orders placed during the hospital encounter of 09/15/17  DG Foot Complete Left  Narrative CLINICAL DATA:  O put left heel pain.  Constant pain.  EXAM: LEFT FOOT - COMPLETE 3+ VIEW  COMPARISON:  None.  FINDINGS: There is no evidence of fracture or dislocation. There is a plantar calcaneal spur. The joint spaces are relatively well maintained. Soft tissues are unremarkable.  IMPRESSION: 1.  No acute osseous injury of the left foot. 2. Plantar calcaneal spur.   Electronically Signed By: Elige Ko On: 09/15/2017 15:20  Wrist Imaging: Wrist-R DG Complete: Results for orders placed during the hospital encounter of 06/27/17  DG Wrist Complete Right  Narrative CLINICAL DATA:  Medial wrist pain after falling at work last week.  EXAM: RIGHT WRIST - COMPLETE 3+ VIEW  COMPARISON:  None.  FINDINGS: The mineralization and alignment are normal. There is no evidence of acute fracture or dislocation. The joint spaces are maintained. No focal soft  tissue abnormalities are seen.  IMPRESSION: No acute osseous findings.   Electronically Signed By: Carey Bullocks M.D. On: 06/27/2017 13:35  Complexity Note: Imaging results reviewed.                         ROS  Cardiovascular: Heart murmur Pulmonary or Respiratory: No reported pulmonary signs or symptoms such as wheezing and difficulty taking a deep full breath (Asthma), difficulty blowing air out (Emphysema), coughing up mucus (Bronchitis), persistent dry cough, or temporary stoppage of breathing during sleep Neurological: No reported neurological signs or symptoms such as seizures, abnormal skin sensations, urinary and/or fecal incontinence, being born with an abnormal open spine and/or a tethered spinal cord Psychological-Psychiatric: Psychiatric disorder, Anxiousness, Depressed, Suicidal ideations, Attempted suicide, and Difficulty sleeping and or falling asleep Gastrointestinal: No reported gastrointestinal signs or symptoms such as vomiting or evacuating blood, reflux, heartburn, alternating episodes of diarrhea and constipation, inflamed or scarred liver, or pancreas or irrregular and/or infrequent bowel movements Genitourinary: No reported renal or genitourinary signs or symptoms such as difficulty voiding or producing urine, peeing blood, non-functioning kidney, kidney stones, difficulty emptying the bladder, difficulty controlling the flow of urine, or chronic kidney disease Hematological: No reported hematological signs or symptoms such as prolonged bleeding, low or poor functioning platelets, bruising or bleeding easily, hereditary bleeding problems, low energy levels due to low hemoglobin or being anemic Endocrine: Slow thyroid Rheumatologic: No reported rheumatological signs and symptoms such as fatigue, joint pain, tenderness, swelling, redness, heat, stiffness, decreased range of motion, with or without associated rash Musculoskeletal: Negative for myasthenia gravis,  muscular dystrophy, multiple sclerosis or malignant hyperthermia Work History: Working full time  Allergies  Ms. Gohring is allergic to penicillins, strawberry (diagnostic), other, celebrex [celecoxib], nsaids, and azithromycin.  Laboratory Chemistry Profile   Renal Lab Results  Component Value Date   BUN 14 04/30/2023   CREATININE 0.79 04/30/2023   BCR 18 04/30/2023   GFR 107.67 04/06/2016   GFRAA 105 08/16/2020   GFRNONAA >60 12/18/2022   SPECGRAV 1.020 12/29/2022   PHUR 5.5 12/29/2022   PROTEINUR Negative 12/29/2022     Electrolytes Lab Results  Component Value Date   NA 141 04/30/2023   K 3.9 04/30/2023   CL 103 04/30/2023   CALCIUM 9.2 04/30/2023     Hepatic Lab Results  Component Value Date   AST 18 04/30/2023   ALT 17 04/30/2023   ALBUMIN 4.3 04/30/2023   ALKPHOS 89 04/30/2023   LIPASE 29 02/06/2018     ID Lab Results  Component Value Date   HIV Non Reactive 02/01/2020   SARSCOV2NAA POSITIVE (A) 10/25/2022   PREGTESTUR NEGATIVE 11/14/2020     Bone Lab Results  Component Value Date   VD25OH 15.7 (L) 04/30/2023     Endocrine Lab Results  Component Value Date   GLUCOSE 85 04/30/2023   GLUCOSEU Negative 12/29/2022   HGBA1C 5.1 04/20/2022   TSH 5.690 (H) 04/30/2023   FREET4 0.65 04/20/2022     Neuropathy Lab Results  Component Value Date   VITAMINB12 203 06/29/2023   FOLATE 8.9 06/29/2023   HGBA1C 5.1 04/20/2022   HIV Non Reactive 02/01/2020     CNS No results found for: "COLORCSF", "APPEARCSF", "RBCCOUNTCSF", "WBCCSF", "POLYSCSF", "LYMPHSCSF", "EOSCSF", "PROTEINCSF", "GLUCCSF", "JCVIRUS", "CSFOLI", "IGGCSF", "LABACHR", "ACETBL"  Inflammation (CRP: Acute  ESR: Chronic) Lab Results  Component Value Date   ESRSEDRATE 8 06/03/2022   LATICACIDVEN 0.8 02/07/2018     Rheumatology Lab Results  Component Value Date   LABURIC 4.8 06/03/2022     Coagulation Lab Results  Component Value Date   PLT 307 06/29/2023   LABHEMA Note:  08/16/2020   VITAMINK1 0.65 04/03/2021     Cardiovascular Lab Results  Component Value Date   BNP 35.5 11/02/2019   TROPONINI <0.03 12/11/2018   HGB 13.0 06/29/2023   HCT 40.9 06/29/2023     Screening Lab Results  Component Value Date   SARSCOV2NAA POSITIVE (A) 10/25/2022   COVIDSOURCE NASOPHARYNGEAL 09/11/2019   HIV Non Reactive 02/01/2020   PREGTESTUR NEGATIVE 11/14/2020     Cancer No results found for: "CEA", "CA125", "LABCA2"   Allergens No results found for: "ALMOND", "APPLE", "ASPARAGUS", "AVOCADO", "BANANA", "BARLEY", "BASIL", "BAYLEAF", "GREENBEAN", "LIMABEAN", "WHITEBEAN", "BEEFIGE", "REDBEET", "BLUEBERRY", "BROCCOLI", "CABBAGE", "MELON", "CARROT", "CASEIN", "CASHEWNUT", "CAULIFLOWER", "CELERY"     Note: Lab results reviewed.  PFSH  Drug: Ms. Quist  reports no history of drug use. Alcohol:  reports that she does not currently use alcohol. Tobacco:  reports that she has never smoked. She has never used smokeless tobacco. Medical:  has a past medical history of Anemia, Anxiety, Chicken pox, Depression, Eating disorder, Heart murmur, History of anemia, History of pulmonary embolus (PE) (2002), Hypothyroidism, and PTSD (post-traumatic stress disorder). Family: family history includes Alzheimer's disease in her maternal grandfather and maternal grandmother; Anxiety disorder in her father; Depression in her father; Diabetes in her father; Lung cancer in her paternal grandmother; Prostate cancer in her paternal grandfather.  Past Surgical History:  Procedure Laterality Date   CHOLECYSTECTOMY  2003   EXCISION OF ENDOMETRIOMA     GASTRIC BYPASS  2003   LAPAROSCOPIC OVARIAN CYSTECTOMY Left 05/10/2018   Procedure: LAPAROSCOPIC OVARIAN CYSTECTOMY;  Surgeon: Natale Milch, MD;  Location: [facility] ORS;  Service: Gynecology;  Laterality: Left;   Active Ambulatory Problems    Diagnosis Date Noted   S/P gastric bypass 03/20/2016   History of eating disorder 03/20/2016    Insomnia 03/20/2016   PTSD (post-traumatic stress disorder) 03/20/2016   Hypothyroidism 03/20/2016   Iron deficiency anemia 04/08/2016   B12 deficiency 04/08/2016   Severe episode of recurrent major depressive disorder, without psychotic features (HCC) 07/23/2016   Bulimia nervosa, in partial remission, mild 07/23/2016   Right knee pain 09/02/2016   Generalized anxiety disorder 10/12/2016   Hx pulmonary embolism 02/03/2018   Ovarian cyst 03/01/2018   PCOS (polycystic ovarian syndrome) 12/14/2014   Endometriosis determined by laparoscopy 06/13/2018   Chronic radicular lumbar pain 01/23/2022   Vasomotor rhinitis 02/12/2022   Idiopathic urticaria 02/12/2022   Chronic allergic conjunctivitis 02/12/2022   At risk for prolonged QT interval syndrome 02/12/2022   Right foot pain 06/15/2022   Meniere disease, bilateral 12/29/2022   Rosacea 04/30/2023   Vitamin D deficiency 04/30/2023   Mixed hyperlipidemia 04/30/2023   Pars defect of lumbar spine 07/22/2023   Spondylolisthesis at L5-S1 level 08/18/2023   Resolved Ambulatory Problems    Diagnosis Date Noted   Dehydration 04/06/2016   Burning sensation of skin 07/13/2016   Viral illness 07/13/2016   Head injury 07/21/2016   Hamstring strain 09/02/2016   Rash 10/16/2016   Exposure to COVID-19 virus 04/07/2019   Other fatigue 04/16/2019   Cervical strain 11/28/2019   Health care maintenance 01/08/2015   High risk medication use 02/12/2022   Unspecified  abnormal involuntary movements 03/12/2022   Past Medical History:  Diagnosis Date   Anemia    Anxiety    Chicken pox    Depression    Eating disorder    Heart murmur    History of anemia    History of pulmonary embolus (PE) 2002   Constitutional Exam  General appearance: Well nourished, well developed, and well hydrated. In no apparent acute distress Vitals:   08/18/23 1310  BP: 133/71  Pulse: 61  Resp: 16  Temp: (!) 97.2 F (36.2 C)  TempSrc: Temporal  SpO2: 99%   Weight: 271 lb (122.9 kg)  Height: 5\' 3"  (1.6 m)   BMI Assessment: Estimated body mass index is 48.01 kg/m as calculated from the following:   Height as of this encounter: 5\' 3"  (1.6 m).   Weight as of this encounter: 271 lb (122.9 kg).  BMI interpretation table: BMI level Category Range association with higher incidence of chronic pain  <18 kg/m2 Underweight   18.5-24.9 kg/m2 Ideal body weight   25-29.9 kg/m2 Overweight Increased incidence by 20%  30-34.9 kg/m2 Obese (Class I) Increased incidence by 68%  35-39.9 kg/m2 Severe obesity (Class II) Increased incidence by 136%  >40 kg/m2 Extreme obesity (Class III) Increased incidence by 254%   Patient's current BMI Ideal Body weight  Body mass index is 48.01 kg/m. Ideal body weight: 52.4 kg (115 lb 8.3 oz) Adjusted ideal body weight: 80.6 kg (177 lb 11.4 oz)   BMI Readings from Last 4 Encounters:  08/18/23 48.01 kg/m  07/22/23 48.01 kg/m  07/01/23 48.91 kg/m  05/28/23 49.25 kg/m   Wt Readings from Last 4 Encounters:  08/18/23 271 lb (122.9 kg)  07/22/23 271 lb (122.9 kg)  07/01/23 276 lb 1.6 oz (125.2 kg)  04/30/23 278 lb (126.1 kg)    Psych/Mental status: Alert, oriented x 3 (person, place, & time)       Eyes: PERLA Respiratory: No evidence of acute respiratory distress  Lumbar Spine Area Exam  Skin & Axial Inspection: No masses, redness, or swelling Alignment: Symmetrical Functional ROM: Pain restricted ROM affecting primarily the left Stability: No instability detected Muscle Tone/Strength: Functionally intact. No obvious neuro-muscular anomalies detected. Sensory (Neurological): Dermatomal pain pattern Palpation: Complains of area being tender to palpation       Provocative Tests: Hyperextension/rotation test: deferred today       Lumbar quadrant test (Kemp's test): (+) on the left for foraminal stenosis Lateral bending test: (+) ipsilateral radicular pain, on the left. Positive for left-sided foraminal  stenosis.  Gait & Posture Assessment  Ambulation: Unassisted Gait: Relatively normal for age and body habitus Posture: WNL  Lower Extremity Exam    Side: Right lower extremity  Side: Left lower extremity  Stability: No instability observed          Stability: No instability observed          Skin & Extremity Inspection: Skin color, temperature, and hair growth are WNL. No peripheral edema or cyanosis. No masses, redness, swelling, asymmetry, or associated skin lesions. No contractures.  Skin & Extremity Inspection: Skin color, temperature, and hair growth are WNL. No peripheral edema or cyanosis. No masses, redness, swelling, asymmetry, or associated skin lesions. No contractures.  Functional ROM: Unrestricted ROM                  Functional ROM: Unrestricted ROM                  Muscle Tone/Strength: Functionally intact.  No obvious neuro-muscular anomalies detected.  Muscle Tone/Strength: Functionally intact. No obvious neuro-muscular anomalies detected.  Sensory (Neurological): Unimpaired        Sensory (Neurological): Unimpaired        DTR: Patellar: deferred today Achilles: deferred today Plantar: deferred today  DTR: Patellar: deferred today Achilles: deferred today Plantar: deferred today  Palpation: No palpable anomalies  Palpation: No palpable anomalies    Assessment  Primary Diagnosis & Pertinent Problem List: The primary encounter diagnosis was Chronic radicular lumbar pain. Diagnoses of Pars defect of lumbar spine (L5), Sacroiliac joint pain (left), and Spondylolisthesis at L5-S1 level were also pertinent to this visit.  Visit Diagnosis (New problems to examiner): 1. Chronic radicular lumbar pain   2. Pars defect of lumbar spine (L5)   3. Sacroiliac joint pain (left)   4. Spondylolisthesis at L5-S1 level    Plan of Care (Initial workup plan)   Assessment and Plan    Lumbar Disc Herniation with Radiculopathy   She has chronic low back and left gluteal pain since  August, exacerbated by standing and certain movements. An MRI shows L5-S1 disc herniation with nerve root compression, causing symptoms consistent with L5 nerve root irritation. We discussed the benefits and risks of an epidural steroid injection, including pain relief and diagnostic value, and decided to proceed despite her anxiety about needles, using fluoroscopic guidance, local anesthetic, and IV Versed for sedation. We will schedule a left-sided transforaminal epidural injection with IV Versed for sedation, advise NPO status after midnight before the procedure, instruct her to bring a driver on the day of the procedure, and follow up based on injection results to consider further interventions such as a nerve block or SI joint evaluation.  Facet Arthropathy   MRI indicates facet arthropathy at L5-S1, more pronounced on the right side, contributing to her buttock pain. We will consider a left-sided nerve block for facet disease if the epidural injection is ineffective.  Spondylolisthesis   MRI shows a pars defect at L5-S1 causing a slight anterior shift, contributing to her pain and exacerbating nerve root irritation and facet arthropathy. We will monitor and reassess based on her response to initial treatment.  Pelvic Mass (Follow-up)   An incidental finding of a pelvic mass on MRI was noted. She has followed up with OB/GYN and had an ultrasound, and is awaiting results. We will review the ultrasound results once available and coordinate with OB/GYN for further management.  General Health Maintenance   She is actively engaged in physical therapy, Pilates, and barre to strengthen core muscles and manage pain. We encourage the continuation of physical therapy and core strengthening exercises, advising her to monitor for any new or worsening symptoms and report promptly.  Follow-up   We will schedule a follow-up appointment after the epidural injection and discuss further treatment options based on  her response to the injection.       Procedure Orders         Lumbar Transforaminal Epidural     Interventional management options: Ms. Riemersma was informed that there is no guarantee that she would be a candidate for interventional therapies. The decision will be based on the results of diagnostic studies, as well as Ms. Waddle's risk profile.  Procedure(s) under consideration:  Left L5 transforaminal ESI Diagnostic lumbar facet medial branch nerve blocks Diagnostic left SI joint and piriformis injection   Provider-requested follow-up: Return in about 5 days (around 08/23/2023), or Left L5 TF ESI, for in clinic IV Versed.  Future  Appointments  Date Time Provider Department Center  08/19/2023 10:40 AM Dorcas Carrow, DO [facility]  08/23/2023 12:40 PM Edward Jolly, MD [facility] None  08/23/2023  2:15 PM Julieanne Manson, MD [facility] None  01/04/2024  2:30 PM [facility] None  01/06/2024 10:30 AM Rickard Patience, MD [facility] None  01/06/2024 10:45 AM [facility] INFUSION CHAIR 1 [facility] None    Duration of encounter: .  Total time on encounter, as per AMA guidelines included both the face-to-face and non-face-to-face time personally spent by the physician and/or other qualified health care professional(s) on the day of the encounter (includes time in activities that require the physician or other qualified health care professional and does not include time in activities normally performed by clinical staff). Physician's time may include the following activities when performed: Preparing to see the patient (e.g., pre-charting review of records, searching for previously ordered imaging, lab work, and nerve conduction tests) Review of prior analgesic pharmacotherapies. Reviewing PMP Interpreting ordered tests (e.g., lab work, imaging, nerve conduction tests) Performing post-procedure evaluations, including interpretation of diagnostic procedures Obtaining and/or reviewing  separately obtained history Performing a medically appropriate examination and/or evaluation Counseling and educating the patient/family/caregiver Ordering medications, tests, or procedures Referring and communicating with other health care professionals (when not separately reported) Documenting clinical information in the electronic or other health record Independently interpreting results (not separately reported) and communicating results to the patient/ family/caregiver Care coordination (not separately reported)  Note by: Edward Jolly, MD (AI and TTS technology used. I apologize for any typographical errors that were not detected and corrected.) Date: 08/18/2023; Time: 2:44 PM

## 2023-08-19 ENCOUNTER — Encounter: Payer: Self-pay | Admitting: Family Medicine

## 2023-08-19 ENCOUNTER — Ambulatory Visit: Payer: BC Managed Care – PPO | Admitting: Family Medicine

## 2023-08-19 VITALS — BP 101/55 | HR 67 | Temp 98.6°F | Resp 15

## 2023-08-19 DIAGNOSIS — F332 Major depressive disorder, recurrent severe without psychotic features: Secondary | ICD-10-CM

## 2023-08-19 NOTE — Telephone Encounter (Signed)
Dc'd 08/06/23 by Josefa Half D.O.  Requested Prescriptions  Refused Prescriptions Disp Refills   celecoxib (CELEBREX) 100 MG capsule [Pharmacy Med Name: CELECOXIB 100 MG CAPSULE] 60 capsule 0    Sig: TAKE 1 CAPSULE BY MOUTH 2 TIMES DAILY AS NEEDED.     Analgesics:  COX2 Inhibitors Failed - 08/18/2023  1:10 AM      Failed - Manual Review: Labs are only required if the patient has taken medication for more than 8 weeks.      Passed - HGB in normal range and within 360 days    Hemoglobin  Date Value Ref Range Status  06/29/2023 13.0 12.0 - 15.0 g/dL Final  02/72/5366 44.0 11.1 - 15.9 g/dL Final         Passed - Cr in normal range and within 360 days    Creatinine, Ser  Date Value Ref Range Status  04/30/2023 0.79 0.57 - 1.00 mg/dL Final         Passed - HCT in normal range and within 360 days    HCT  Date Value Ref Range Status  06/29/2023 40.9 36.0 - 46.0 % Final   Hematocrit  Date Value Ref Range Status  12/29/2022 40.1 34.0 - 46.6 % Final         Passed - AST in normal range and within 360 days    AST  Date Value Ref Range Status  04/30/2023 18 0 - 40 IU/L Final         Passed - ALT in normal range and within 360 days    ALT  Date Value Ref Range Status  04/30/2023 17 0 - 32 IU/L Final         Passed - eGFR is 30 or above and within 360 days    GFR calc Af Amer  Date Value Ref Range Status  08/16/2020 105 >59 mL/min/1.73 Final    Comment:    **In accordance with recommendations from the NKF-ASN Task force,**   [facility] is in the process of updating its eGFR calculation to the   2021 CKD-EPI creatinine equation that estimates kidney function   without a race variable.    GFR, Estimated  Date Value Ref Range Status  12/18/2022 >60 >60 mL/min Final    Comment:    (NOTE) Calculated using the CKD-EPI Creatinine Equation (2021)    GFR  Date Value Ref Range Status  04/06/2016 107.67 >60.00 mL/min Final   eGFR  Date Value Ref Range Status  04/30/2023 95 >59  mL/min/1.73 Final         Passed - Patient is not pregnant      Passed - Valid encounter within last 12 months    Recent Outpatient Visits           1 week ago Severe episode of recurrent major depressive disorder, without psychotic features (HCC)   [facility] [place], Megan P, DO   1 week ago Cyst of left ovary   [facility] [place], Megan P, DO   2 weeks ago Severe episode of recurrent major depressive disorder, without psychotic features [facility])   [facility] Mall[place], Megan P, DO   4 weeks ago Lumbosacral spondylolysis   [facility], Ocie Bob, MD   1 month ago Severe episode of recurrent major depressive disorder, without psychotic features [facility])   [facility] [place], Megan P, DO  Future Appointments             Today Dorcas Carrow, DO [facility]

## 2023-08-19 NOTE — Progress Notes (Signed)
BP (!) 101/55 (BP Location: Right Wrist, Patient Position: Sitting, Cuff Size: Large)   Pulse 67   Temp 98.6 F (37 C) (Oral)   Resp 15   LMP 08/09/2023 (Approximate)   SpO2 99%    Subjective:    Patient ID: Sharon Gibson, female    DOB: 03-31-1981, 42 y.o.   MRN: [id number]  HPI: Sharon Gibson is a 42 y.o. female  Chief Complaint  Patient presents with   mental health follow up    Not great, Starts IOP at 12 noon. [facility]    DEPRESSION- takign the 1.75mg  of the rexaulti Mood status: stable Satisfied with current treatment?: no Symptom severity: severe  Duration of current treatment : months Side effects: no Medication compliance: excellent compliance Psychotherapy/counseling: yes current Depressed mood: yes Anxious mood: yes Anhedonia: no Significant weight loss or gain: no Insomnia: no  Fatigue: yes Feelings of worthlessness or guilt: yes Impaired concentration/indecisiveness: yes Suicidal ideations: yes Hopelessness: yes Crying spells: yes    08/19/2023   10:57 AM 08/12/2023    4:00 PM 08/06/2023    3:38 PM 07/30/2023   10:36 AM 07/20/2023    3:54 PM  Depression screen PHQ 2/9  Decreased Interest 3 3 3 1 1   Down, Depressed, Hopeless 3 3 3 2 1   PHQ - 2 Score 6 6 6 3 2   Altered sleeping 3 3 3 1 2   Tired, decreased energy 3 3 3 1 1   Change in appetite 3 3 2 1 3   Feeling bad or failure about yourself  3 3 3 3 3   Trouble concentrating 3 3 3 1 2   Moving slowly or fidgety/restless 3 2 1 1 1   Suicidal thoughts 3 3 3 2 2   PHQ-9 Score 27 26 24 13 16   Difficult doing work/chores  Extremely dIfficult Extremely dIfficult Very difficult Very difficult      08/19/2023   10:57 AM 08/12/2023    4:00 PM 08/06/2023    3:39 PM 07/30/2023   10:38 AM  GAD 7 : Generalized Anxiety Score  Nervous, Anxious, on Edge 3 3 3 3   Control/stop worrying 3 3 3 3   Worry too much - different things 3 3 3 3   Trouble relaxing 3 3 3 3   Restless 3 3 3 3   Easily annoyed  or irritable 2 3 3 3   Afraid - awful might happen 3 3 3 3   Total GAD 7 Score 20 21 21 21   Anxiety Difficulty Extremely difficult Extremely difficult Extremely difficult Extremely difficult    Relevant past medical, surgical, family and social history reviewed and updated as indicated. Interim medical history since our last visit reviewed. Allergies and medications reviewed and updated.  Review of Systems  Constitutional: Negative.   Respiratory: Negative.    Cardiovascular: Negative.   Gastrointestinal: Negative.   Musculoskeletal: Negative.   Skin: Negative.   Psychiatric/Behavioral:  Positive for dysphoric mood. Negative for agitation, behavioral problems, confusion, decreased concentration, hallucinations, self-injury, sleep disturbance and suicidal ideas. The patient is nervous/anxious. The patient is not hyperactive.     Per HPI unless specifically indicated above     Objective:    BP (!) 101/55 (BP Location: Right Wrist, Patient Position: Sitting, Cuff Size: Large)   Pulse 67   Temp 98.6 F (37 C) (Oral)   Resp 15   LMP 08/09/2023 (Approximate)   SpO2 99%   Wt Readings from Last 3 Encounters:  08/18/23 271 lb (122.9 kg)  07/22/23 271 lb (122.9 kg)  07/01/23 276 lb 1.6 oz (125.2 kg)    Physical Exam Vitals and nursing note reviewed.  Constitutional:      General: She is not in acute distress.    Appearance: Normal appearance. She is not ill-appearing, toxic-appearing or diaphoretic.  HENT:     Head: Normocephalic and atraumatic.     Right Ear: External ear normal.     Left Ear: External ear normal.     Nose: Nose normal.     Mouth/Throat:     Mouth: Mucous membranes are moist.     Pharynx: Oropharynx is clear.  Eyes:     General: No scleral icterus.       Right eye: No discharge.        Left eye: No discharge.     Extraocular Movements: Extraocular movements intact.     Conjunctiva/sclera: Conjunctivae normal.     Pupils: Pupils are equal, round, and  reactive to light.  Cardiovascular:     Rate and Rhythm: Normal rate and regular rhythm.     Pulses: Normal pulses.     Heart sounds: Normal heart sounds. No murmur heard.    No friction rub. No gallop.  Pulmonary:     Effort: Pulmonary effort is normal. No respiratory distress.     Breath sounds: Normal breath sounds. No stridor. No wheezing, rhonchi or rales.  Chest:     Chest wall: No tenderness.  Musculoskeletal:        General: Normal range of motion.     Cervical back: Normal range of motion and neck supple.  Skin:    General: Skin is warm and dry.     Capillary Refill: Capillary refill takes less than 2 seconds.     Coloration: Skin is not jaundiced or pale.     Findings: No bruising, erythema, lesion or rash.  Neurological:     General: No focal deficit present.     Mental Status: She is alert and oriented to person, place, and time. Mental status is at baseline.  Psychiatric:        Mood and Affect: Mood normal.        Behavior: Behavior normal.        Thought Content: Thought content normal.        Judgment: Judgment normal.     Results for orders placed or performed in visit on 06/29/23  Folate  Result Value Ref Range   Folate 8.9 >5.9 ng/mL  Vitamin B12  Result Value Ref Range   Vitamin B-12 203 180 - 914 pg/mL  Ferritin  Result Value Ref Range   Ferritin 8 (L) 11 - 307 ng/mL  Iron and TIBC  Result Value Ref Range   Iron 42 28 - 170 ug/dL   TIBC 161 096 - 045 ug/dL   Saturation Ratios 10 (L) 10.4 - 31.8 %   UIBC 371 ug/dL  CBC with Differential (Cancer Center Only)  Result Value Ref Range   WBC Count 10.1 4.0 - 10.5 K/uL   RBC 4.69 3.87 - 5.11 MIL/uL   Hemoglobin 13.0 12.0 - 15.0 g/dL   HCT 40.9 81.1 - 91.4 %   MCV 87.2 80.0 - 100.0 fL   MCH 27.7 26.0 - 34.0 pg   MCHC 31.8 30.0 - 36.0 g/dL   RDW 78.2 95.6 - 21.3 %   Platelet Count 307 150 - 400 K/uL   nRBC 0.0 0.0 - 0.2 %   Neutrophils Relative % 64 %   Neutro Abs 6.5  1.7 - 7.7 K/uL    Lymphocytes Relative 26 %   Lymphs Abs 2.6 0.7 - 4.0 K/uL   Monocytes Relative 6 %   Monocytes Absolute 0.6 0.1 - 1.0 K/uL   Eosinophils Relative 3 %   Eosinophils Absolute 0.3 0.0 - 0.5 K/uL   Basophils Relative 1 %   Basophils Absolute 0.1 0.0 - 0.1 K/uL   Immature Granulocytes 0 %   Abs Immature Granulocytes 0.04 0.00 - 0.07 K/uL      Assessment & Plan:   Problem List Items Addressed This Visit       Other   Severe episode of recurrent major depressive disorder, without psychotic features (HCC) - Primary    Starting intensive outpatient program today. Will fill out paperwork for ADA and FMLA so she can attend this program. Continue titration on her rexaulti. Recheck 1 week. Call with any concerns.         Follow up plan: Return in about 1 week (around 08/26/2023).

## 2023-08-23 ENCOUNTER — Encounter: Payer: BC Managed Care – PPO | Admitting: Obstetrics

## 2023-08-23 ENCOUNTER — Ambulatory Visit
Admission: RE | Admit: 2023-08-23 | Discharge: 2023-08-23 | Disposition: A | Discharge status: Home / Self Care | Payer: BC Managed Care – PPO | Source: Ambulatory Visit | Attending: Student in an Organized Health Care Education/Training Program | Admitting: Student in an Organized Health Care Education/Training Program

## 2023-08-23 ENCOUNTER — Encounter: Payer: Self-pay | Admitting: Family Medicine

## 2023-08-23 ENCOUNTER — Encounter: Payer: Self-pay | Admitting: Student in an Organized Health Care Education/Training Program

## 2023-08-23 ENCOUNTER — Ambulatory Visit
Payer: BC Managed Care – PPO | Attending: Student in an Organized Health Care Education/Training Program | Admitting: Student in an Organized Health Care Education/Training Program

## 2023-08-23 VITALS — BP 118/41 | HR 54 | Temp 97.3°F | Resp 14 | Ht 63.0 in | Wt 271.0 lb

## 2023-08-23 DIAGNOSIS — M4306 Spondylolysis, lumbar region: Secondary | ICD-10-CM | POA: Insufficient documentation

## 2023-08-23 DIAGNOSIS — G8929 Other chronic pain: Secondary | ICD-10-CM | POA: Diagnosis not present

## 2023-08-23 DIAGNOSIS — M5416 Radiculopathy, lumbar region: Secondary | ICD-10-CM | POA: Diagnosis present

## 2023-08-23 MED ORDER — LACTATED RINGERS IV SOLN: Freq: Once | INTRAVENOUS | Status: AC

## 2023-08-23 MED ORDER — MIDAZOLAM HCL 2 MG/2ML IJ SOLN
INTRAMUSCULAR | Status: AC
Start: 2023-08-23 — End: ?
  Filled 2023-08-23: qty 2

## 2023-08-23 MED ORDER — ROPIVACAINE HCL 2 MG/ML IJ SOLN
1.0000 mL | Freq: Once | INTRAMUSCULAR | Status: AC
  Administered 2023-08-23: 1 mL via EPIDURAL

## 2023-08-23 MED ORDER — ROPIVACAINE HCL 2 MG/ML IJ SOLN
INTRAMUSCULAR | Status: AC
  Filled 2023-08-23: qty 20

## 2023-08-23 MED ORDER — SODIUM CHLORIDE 0.9% FLUSH
1.0000 mL | Freq: Once | INTRAVENOUS | Status: AC
  Administered 2023-08-23: 1 mL

## 2023-08-23 MED ORDER — DEXAMETHASONE SODIUM PHOSPHATE 10 MG/ML IJ SOLN
10.0000 mg | Freq: Once | INTRAMUSCULAR | Status: AC
  Administered 2023-08-23: 10 mg

## 2023-08-23 MED ORDER — IOHEXOL 180 MG/ML  SOLN
10.0000 mL | Freq: Once | INTRAMUSCULAR | Status: AC
  Administered 2023-08-23: 10 mL via EPIDURAL

## 2023-08-23 MED ORDER — LIDOCAINE HCL 2 % IJ SOLN
INTRAMUSCULAR | Status: AC
Start: 2023-08-23 — End: ?
  Filled 2023-08-23: qty 20

## 2023-08-23 MED ORDER — MIDAZOLAM HCL 2 MG/2ML IJ SOLN
0.5000 mg | Freq: Once | INTRAMUSCULAR | Status: AC
  Administered 2023-08-23: 2 mg via INTRAVENOUS

## 2023-08-23 MED ORDER — LIDOCAINE HCL 2 % IJ SOLN
20.0000 mL | Freq: Once | INTRAMUSCULAR | Status: AC
  Administered 2023-08-23: 400 mg

## 2023-08-23 MED ORDER — DEXAMETHASONE SODIUM PHOSPHATE 10 MG/ML IJ SOLN
INTRAMUSCULAR | Status: AC
Start: 2023-08-23 — End: ?
  Filled 2023-08-23: qty 1

## 2023-08-23 MED ORDER — SODIUM CHLORIDE (PF) 0.9 % IJ SOLN
INTRAMUSCULAR | Status: AC
Start: 2023-08-23 — End: ?
  Filled 2023-08-23: qty 10

## 2023-08-23 MED ORDER — IOHEXOL 180 MG/ML  SOLN
INTRAMUSCULAR | Status: AC
  Filled 2023-08-23: qty 20

## 2023-08-23 NOTE — Patient Instructions (Signed)

## 2023-08-23 NOTE — Progress Notes (Signed)
PROVIDER NOTE: Interpretation of information contained herein should be left to medically-trained personnel. Specific patient instructions are provided elsewhere under "Patient Instructions" section of medical record. This document was created in part using STT-dictation technology, any transcriptional errors that may result from this process are unintentional.  Patient: Sharon Gibson Type: Established DOB: December 27, 1980 MRN: [id number] PCP: Dorcas Carrow, DO  Service: Procedure DOS: 08/23/2023 Setting: Ambulatory Location: Ambulatory outpatient facility Delivery: Face-to-face Provider: Edward Jolly, MD Specialty: Interventional Pain Management Specialty designation: 09 Location: Outpatient facility Ref. Prov.: Olevia Perches P, DO       Interventional Therapy   Procedure: Lumbar trans-foraminal epidural steroid injection (L-TFESI) #1  Laterality: Left (-LT)  Level: L5 nerve root(s) Imaging: Fluoroscopy-guided         Anesthesia: Local anesthesia (1-2% Lidocaine) Sedation: Minimal Sedation                       DOS: 08/23/2023  Performed by: Edward Jolly, MD  Purpose: Diagnostic/Therapeutic Indications: Lumbar radicular pain severe enough to impact quality of life or function. 1. Chronic radicular lumbar pain   2. Pars defect of lumbar spine (L5)    NAS-11 Pain score:   Pre-procedure: 2 /10   Post-procedure: 0-No pain/10     Position / Prep / Materials:  Position: Prone  Prep solution: ChloraPrep (2% chlorhexidine gluconate and 70% isopropyl alcohol) Prep Area: Entire Posterior Lumbosacral Area.  From the lower tip of the scapula down to the tailbone and from flank to flank. Materials:  Tray: Block Needle(s):  Type: Spinal  Gauge (G): 22  Length: 5-in  Qty: 1      H&P (Pre-op Assessment):  Sharon Gibson is a 42 y.o. (year old), female patient, seen today for interventional treatment. She  has a past surgical history that includes Gastric bypass (2003);  Cholecystectomy (2003); Laparoscopic ovarian cystectomy (Left, 05/10/2018); and Excision of endometrioma. Sharon Gibson has a current medication list which includes the following prescription(s): brexpiprazole, clonazepam, gabapentin, levothyroxine, sertraline, tizanidine, vitamin d (ergocalciferol), brexpiprazole, and metronidazole. Her primarily concern today is the Back Pain (Lumbar left )  Initial Vital Signs:  Pulse/HCG Rate: (!) 55  Temp: (!) 97.3 F (36.3 C) Resp: 16 BP: (!) 120/58 SpO2: 98 %  BMI: Estimated body mass index is 48.01 kg/m as calculated from the following:   Height as of this encounter: 5\' 3"  (1.6 m).   Weight as of this encounter: 271 lb (122.9 kg).  Risk Assessment: Allergies: Reviewed. She is allergic to penicillins, strawberry (diagnostic), other, celebrex [celecoxib], nsaids, and azithromycin.  Allergy Precautions: None required Coagulopathies: Reviewed. None identified.  Blood-thinner therapy: None at this time Active Infection(s): Reviewed. None identified. Sharon Gibson is afebrile  Site Confirmation: Sharon Gibson was asked to confirm the procedure and laterality before marking the site Procedure checklist: Completed Consent: Before the procedure and under the influence of no sedative(s), amnesic(s), or anxiolytics, the patient was informed of the treatment options, risks and possible complications. To fulfill our ethical and legal obligations, as recommended by the American Medical Association's Code of Ethics, I have informed the patient of my clinical impression; the nature and purpose of the treatment or procedure; the risks, benefits, and possible complications of the intervention; the alternatives, including doing nothing; the risk(s) and benefit(s) of the alternative treatment(s) or procedure(s); and the risk(s) and benefit(s) of doing nothing. The patient was provided information about the general risks and possible complications associated with the  procedure. These may include, but are  not limited to: failure to achieve desired goals, infection, bleeding, organ or nerve damage, allergic reactions, paralysis, and death. In addition, the patient was informed of those risks and complications associated to Spine-related procedures, such as failure to decrease pain; infection (i.e.: Meningitis, epidural or intraspinal abscess); bleeding (i.e.: epidural hematoma, subarachnoid hemorrhage, or any other type of intraspinal or peri-dural bleeding); organ or nerve damage (i.e.: Any type of peripheral nerve, nerve root, or spinal cord injury) with subsequent damage to sensory, motor, and/or autonomic systems, resulting in permanent pain, numbness, and/or weakness of one or several areas of the body; allergic reactions; (i.e.: anaphylactic reaction); and/or death. Furthermore, the patient was informed of those risks and complications associated with the medications. These include, but are not limited to: allergic reactions (i.e.: anaphylactic or anaphylactoid reaction(s)); adrenal axis suppression; blood sugar elevation that in diabetics may result in ketoacidosis or comma; water retention that in patients with history of congestive heart failure may result in shortness of breath, pulmonary edema, and decompensation with resultant heart failure; weight gain; swelling or edema; medication-induced neural toxicity; particulate matter embolism and blood vessel occlusion with resultant organ, and/or nervous system infarction; and/or aseptic necrosis of one or more joints. Finally, the patient was informed that Medicine is not an exact science; therefore, there is also the possibility of unforeseen or unpredictable risks and/or possible complications that may result in a catastrophic outcome. The patient indicated having understood very clearly. We have given the patient no guarantees and we have made no promises. Enough time was given to the patient to ask questions, all of  which were answered to the patient's satisfaction. Sharon Gibson has indicated that she wanted to continue with the procedure. Attestation: I, the ordering provider, attest that I have discussed with the patient the benefits, risks, side-effects, alternatives, likelihood of achieving goals, and potential problems during recovery for the procedure that I have provided informed consent. Date  Time: 08/23/2023  1:01 PM   Pre-Procedure Preparation:  Monitoring: As per clinic protocol. Respiration, ETCO2, SpO2, BP, heart rate and rhythm monitor placed and checked for adequate function Safety Precautions: Patient was assessed for positional comfort and pressure points before starting the procedure. Time-out: I initiated and conducted the "Time-out" before starting the procedure, as per protocol. The patient was asked to participate by confirming the accuracy of the "Time Out" information. Verification of the correct person, site, and procedure were performed and confirmed by me, the nursing staff, and the patient. "Time-out" conducted as per Joint Commission's Universal Protocol (UP.01.01.01). Time: 1347 Start Time: 1347 hrs.  Description/Narrative of Procedure:          Target: The 6 o'clock position under the pedicle, on the affected side. Region: Posterolateral Lumbosacral Approach: Posterior Percutaneous Paravertebral approach.  Rationale (medical necessity): procedure needed and proper for the diagnosis and/or treatment of the patient's medical symptoms and needs. Procedural Technique Safety Precautions: Aspiration looking for blood return was conducted prior to all injections. At no point did we inject any substances, as a needle was being advanced. No attempts were made at seeking any paresthesias. Safe injection practices and needle disposal techniques used. Medications properly checked for expiration dates. SDV (single dose vial) medications used. Description of the Procedure: Protocol  guidelines were followed. The patient was placed in position over the procedure table. The target area was identified and the area prepped in the usual manner. Skin & deeper tissues infiltrated with local anesthetic. Appropriate amount of time allowed to pass for local anesthetics to  take effect. The procedure needles were then advanced to the target area. Proper needle placement secured. Negative aspiration confirmed. Solution injected in intermittent fashion, asking for systemic symptoms every 0.5cc of injectate. The needles were then removed and the area cleansed, making sure to leave some of the prepping solution back to take advantage of its long term bactericidal properties.  Vitals:   08/23/23 1306 08/23/23 1345 08/23/23 1350 08/23/23 1401  BP: (!) 120/58 123/61 113/80 (!) 118/41  Pulse: (!) 55 (!) 55 (!) 49 (!) 54  Resp: 16 15 19 14   Temp: (!) 97.3 F (36.3 C)     TempSrc: Temporal     SpO2: 98% 100% 100% 100%  Weight: 271 lb (122.9 kg)     Height: 5\' 3"  (1.6 m)       Start Time: 1347 hrs. End Time: 1354 hrs.  Imaging Guidance (Spinal):          Type of Imaging Technique: Fluoroscopy Guidance (Spinal) Indication(s): Fluoroscopy guidance for needle placement to enhance accuracy in procedures requiring precise needle localization for targeted delivery of medication in or near specific anatomical locations not easily accessible without such real-time imaging assistance. Exposure Time: Please see nurses notes. Contrast: Before injecting any contrast, we confirmed that the patient did not have an allergy to iodine, shellfish, or radiological contrast. Once satisfactory needle placement was completed at the desired level, radiological contrast was injected. Contrast injected under live fluoroscopy. No contrast complications. See chart for type and volume of contrast used. Fluoroscopic Guidance: I was personally present during the use of fluoroscopy. "Tunnel Vision Technique" used to obtain  the best possible view of the target area. Parallax error corrected before commencing the procedure. "Direction-depth-direction" technique used to introduce the needle under continuous pulsed fluoroscopy. Once target was reached, antero-posterior, oblique, and lateral fluoroscopic projection used confirm needle placement in all planes. Images permanently stored in EMR. Interpretation: I personally interpreted the imaging intraoperatively. Adequate needle placement confirmed in multiple planes. Appropriate spread of contrast into desired area was observed. No evidence of afferent or efferent intravascular uptake. No intrathecal or subarachnoid spread observed. Permanent images saved into the patient's record.  Post-operative Assessment:  Post-procedure Vital Signs:  Pulse/HCG Rate: (!) 54  Temp: (!) 97.3 F (36.3 C) Resp: 14 BP: (!) 118/41 SpO2: 100 %  EBL: None  Complications: No immediate post-treatment complications observed by team, or reported by patient.  Note: The patient tolerated the entire procedure well. A repeat set of vitals were taken after the procedure and the patient was kept under observation following institutional policy, for this type of procedure. Post-procedural neurological assessment was performed, showing return to baseline, prior to discharge. The patient was provided with post-procedure discharge instructions, including a section on how to identify potential problems. Should any problems arise concerning this procedure, the patient was given instructions to immediately contact us, at any time, without hesitation. In any case, we plan to contact the patient by telephone for a follow-up status report regarding this interventional procedure.  Comments:  No additional relevant information.  Plan of Care (POC)  Orders:  Orders Placed This Encounter  Procedures   DG PAIN CLINIC C-ARM 1-60 MIN NO REPORT    Intraoperative interpretation by procedural physician at [facility].    Standing Status:   Standing    Number of Occurrences:   1    Order Specific Question:   Reason for exam:    Answer:   Assistance in needle guidance and placement  for procedures requiring needle placement in or near specific anatomical locations not easily accessible without such assistance.     Medications ordered for procedure: Meds ordered this encounter  Medications   iohexol (OMNIPAQUE) 180 MG/ML injection 10 mL    Must be Myelogram-compatible. If not available, you may substitute with a water-soluble, non-ionic, hypoallergenic, myelogram-compatible radiological contrast medium.   lidocaine (XYLOCAINE) 2 % (with pres) injection 400 mg   lactated ringers infusion   midazolam (VERSED) injection 0.5-2 mg    Make sure Flumazenil is available in the pyxis when using this medication. If oversedation occurs, administer 0.2 mg IV over 15 sec. If after 45 sec no response, administer 0.2 mg again over 1 min; may repeat at 1 min intervals; not to exceed 4 doses (1 mg)   sodium chloride flush (NS) 0.9 % injection 1 mL   ropivacaine (PF) 2 mg/mL (0.2%) (NAROPIN) injection 1 mL   dexamethasone (DECADRON) injection 10 mg   Medications administered: We administered iohexol, lidocaine, lactated ringers, midazolam, sodium chloride flush, ropivacaine (PF) 2 mg/mL (0.2%), and dexamethasone.  See the medical record for exact dosing, route, and time of administration.  Follow-up plan:   Return in about 4 weeks (around 09/20/2023) for PPE.       Left L5 TF ESI    Recent Visits Date Type Provider Dept  08/18/23 Office Visit Edward Jolly, MD [facility]  Showing recent visits within past 90 days and meeting all other requirements Today's Visits Date Type Provider Dept  08/23/23 Procedure visit Edward Jolly, MD [facility]  Showing today's visits and meeting all other requirements Future Appointments Date Type Provider Dept  09/20/23 Appointment Edward Jolly, MD [facility]  Showing future appointments within next 90 days and meeting all other requirements  Disposition: Discharge home  Discharge (Date  Time): 08/23/2023;   hrs.   Primary Care Physician: Dorcas Carrow, DO Location: [facility] Note by: Edward Jolly, MD (TTS technology used. I apologize for any typographical errors that were not detected and corrected.) Date: 08/23/2023; Time: 2:47 PM  Disclaimer:  Medicine is not an Visual merchandiser. The only guarantee in medicine is that nothing is guaranteed. It is important to note that the decision to proceed with this intervention was based on the information collected from the patient. The Data and conclusions were drawn from the patient's questionnaire, the interview, and the physical examination. Because the information was provided in large part by the patient, it cannot be guaranteed that it has not been purposely or unconsciously manipulated. Every effort has been made to obtain as much relevant data as possible for this evaluation. It is important to note that the conclusions that lead to this procedure are derived in large part from the available data. Always take into account that the treatment will also be dependent on availability of resources and existing treatment guidelines, considered by other Pain Management Practitioners as being common knowledge and practice, at the time of the intervention. For Medico-Legal purposes, it is also important to point out that variation in procedural techniques and pharmacological choices are the acceptable norm. The indications, contraindications, technique, and results of the above procedure should only be interpreted and judged by a Board-Certified Interventional Pain Specialist with extensive familiarity and expertise in the same exact procedure and technique.

## 2023-08-23 NOTE — Progress Notes (Signed)
Safety precautions to be maintained throughout the outpatient stay will include: orient to surroundings, keep bed in low position, maintain call bell within reach at all times, provide assistance with transfer out of bed and ambulation.  

## 2023-08-23 NOTE — Assessment & Plan Note (Signed)
Starting intensive outpatient program today. Will fill out paperwork for ADA and FMLA so she can attend this program. Continue titration on her rexaulti. Recheck 1 week. Call with any concerns.

## 2023-08-24 ENCOUNTER — Telehealth: Payer: Self-pay

## 2023-08-24 ENCOUNTER — Other Ambulatory Visit: Payer: Self-pay | Admitting: Family Medicine

## 2023-08-24 DIAGNOSIS — N83202 Unspecified ovarian cyst, left side: Secondary | ICD-10-CM

## 2023-08-24 NOTE — Telephone Encounter (Signed)
Post procedure follow up.  Lm

## 2023-08-25 ENCOUNTER — Telehealth: Payer: Self-pay | Admitting: Family Medicine

## 2023-08-25 NOTE — Telephone Encounter (Signed)
Returned call to [employer] however had to leave a message. Patients program is expected to last 9 weeks so patient needs to have accommodations in place 08/23/2023-10/29/2023. She will in fact be able to work the 25 hours a week they are requiring. She should also be able to return to normal duty once she completes the program.   Ok to transfer call to myself if she has further questions are returns call.

## 2023-08-25 NOTE — Telephone Encounter (Addendum)
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 Marcelino Duster states that patient's FMLA has ran out and they are trying to do an ADA today, 08/25/2023, for her. Please advise. Marcelino Duster expressed the urgency of this.  Michelle's callback # [phone]  Email address: [email or web address] if need to email instead of faxing back, Marcelino Duster can take it either way.

## 2023-08-31 ENCOUNTER — Encounter: Payer: Self-pay | Admitting: Family Medicine

## 2023-08-31 ENCOUNTER — Ambulatory Visit: Payer: BC Managed Care – PPO | Admitting: Family Medicine

## 2023-08-31 VITALS — BP 130/81 | HR 73 | Temp 98.5°F

## 2023-08-31 DIAGNOSIS — F332 Major depressive disorder, recurrent severe without psychotic features: Secondary | ICD-10-CM | POA: Diagnosis not present

## 2023-08-31 MED ORDER — BREXPIPRAZOLE 0.5 MG PO TABS: 1.5000 mg | ORAL_TABLET | Freq: Every day | ORAL | 3 refills | Status: DC

## 2023-08-31 NOTE — Progress Notes (Signed)
BP 130/81 (BP Location: Left Arm, Patient Position: Sitting, Cuff Size: Normal)   Pulse 73   Temp 98.5 F (36.9 C) (Oral)   LMP 08/09/2023 (Approximate)   SpO2 98%    Subjective:    Patient ID: Sharon Gibson, female    DOB: [birth year], 42 y.o.   MRN: [id number]  HPI: Renise Ribble is a 42 y.o. female  Chief Complaint  Patient presents with   Depression    7-10 follow up, no concerns today, just normal ones per patient    DEPRESSION- has been feeling irritable. Unsure if it's due to her mood or the rexaulti. Mood status: uncontrolled Satisfied with current treatment?: no Symptom severity: severe  Duration of current treatment : chronic Side effects: no Medication compliance: excellent compliance Psychotherapy/counseling: yes current Previous psychiatric medications: rexaulti, sertraline, klonopin Depressed mood: yes Anxious mood: yes Anhedonia: yes Significant weight loss or gain: no Insomnia: no  Fatigue: yes Feelings of worthlessness or guilt: yes Impaired concentration/indecisiveness: yes Suicidal ideations: yes Hopelessness: yes Crying spells: yes    08/31/2023    4:04 PM 08/19/2023   10:57 AM 08/12/2023    4:00 PM 08/06/2023    3:38 PM 07/30/2023   10:36 AM  Depression screen PHQ 2/9  Decreased Interest 3 3 3 3 1   Down, Depressed, Hopeless 3 3 3 3 2   PHQ - 2 Score 6 6 6 6 3   Altered sleeping 3 3 3 3 1   Tired, decreased energy 3 3 3 3 1   Change in appetite 3 3 3 2 1   Feeling bad or failure about yourself  3 3 3 3 3   Trouble concentrating 3 3 3 3 1   Moving slowly or fidgety/restless 3 3 2 1 1   Suicidal thoughts 3 3 3 3 2   PHQ-9 Score 27 27 26 24 13   Difficult doing work/chores   Extremely dIfficult Extremely dIfficult Very difficult      08/31/2023    4:04 PM 08/19/2023   10:57 AM 08/12/2023    4:00 PM 08/06/2023    3:39 PM  GAD 7 : Generalized Anxiety Score  Nervous, Anxious, on Edge 3 3 3 3   Control/stop worrying 3 3 3 3   Worry too much  - different things 3 3 3 3   Trouble relaxing 3 3 3 3   Restless 3 3 3 3   Easily annoyed or irritable 3 2 3 3   Afraid - awful might happen 3 3 3 3   Total GAD 7 Score 21 20 21 21   Anxiety Difficulty  Extremely difficult Extremely difficult Extremely difficult      Relevant past medical, surgical, family and social history reviewed and updated as indicated. Interim medical history since our last visit reviewed. Allergies and medications reviewed and updated.  Review of Systems  Constitutional: Negative.   Respiratory: Negative.    Cardiovascular: Negative.   Gastrointestinal: Negative.   Musculoskeletal: Negative.   Psychiatric/Behavioral:  Positive for dysphoric mood. Negative for agitation, behavioral problems, confusion, decreased concentration, hallucinations, self-injury, sleep disturbance and suicidal ideas. The patient is nervous/anxious. The patient is not hyperactive.     Per HPI unless specifically indicated above     Objective:    BP 130/81 (BP Location: Left Arm, Patient Position: Sitting, Cuff Size: Normal)   Pulse 73   Temp 98.5 F (36.9 C) (Oral)   LMP 08/09/2023 (Approximate)   SpO2 98%   Wt Readings from Last 3 Encounters:  08/23/23 271 lb (122.9 kg)  08/18/23 271 lb (122.9 kg)  07/22/23 271 lb (122.9 kg)    Physical Exam Vitals and nursing note reviewed.  Constitutional:      General: She is not in acute distress.    Appearance: Normal appearance. She is not ill-appearing, toxic-appearing or diaphoretic.  HENT:     Head: Normocephalic and atraumatic.     Right Ear: External ear normal.     Left Ear: External ear normal.     Nose: Nose normal.     Mouth/Throat:     Mouth: Mucous membranes are moist.     Pharynx: Oropharynx is clear.  Eyes:     General: No scleral icterus.       Right eye: No discharge.        Left eye: No discharge.     Extraocular Movements: Extraocular movements intact.     Conjunctiva/sclera: Conjunctivae normal.     Pupils:  Pupils are equal, round, and reactive to light.  Cardiovascular:     Rate and Rhythm: Normal rate and regular rhythm.     Pulses: Normal pulses.     Heart sounds: Normal heart sounds. No murmur heard.    No friction rub. No gallop.  Pulmonary:     Effort: Pulmonary effort is normal. No respiratory distress.     Breath sounds: Normal breath sounds. No stridor. No wheezing, rhonchi or rales.  Chest:     Chest wall: No tenderness.  Musculoskeletal:        General: Normal range of motion.     Cervical back: Normal range of motion and neck supple.  Skin:    General: Skin is warm and dry.     Capillary Refill: Capillary refill takes less than 2 seconds.     Coloration: Skin is not jaundiced or pale.     Findings: No bruising, erythema, lesion or rash.  Neurological:     General: No focal deficit present.     Mental Status: She is alert and oriented to person, place, and time. Mental status is at baseline.  Psychiatric:        Mood and Affect: Mood normal.        Behavior: Behavior normal.        Thought Content: Thought content normal.        Judgment: Judgment normal.     Results for orders placed or performed in visit on 06/29/23  Folate  Result Value Ref Range   Folate 8.9 >5.9 ng/mL  Vitamin B12  Result Value Ref Range   Vitamin B-12 203 180 - 914 pg/mL  Ferritin  Result Value Ref Range   Ferritin 8 (L) 11 - 307 ng/mL  Iron and TIBC  Result Value Ref Range   Iron 42 28 - 170 ug/dL   TIBC 478 295 - 621 ug/dL   Saturation Ratios 10 (L) 10.4 - 31.8 %   UIBC 371 ug/dL  CBC with Differential (Cancer Center Only)  Result Value Ref Range   WBC Count 10.1 4.0 - 10.5 K/uL   RBC 4.69 3.87 - 5.11 MIL/uL   Hemoglobin 13.0 12.0 - 15.0 g/dL   HCT 30.8 65.7 - 84.6 %   MCV 87.2 80.0 - 100.0 fL   MCH 27.7 26.0 - 34.0 pg   MCHC 31.8 30.0 - 36.0 g/dL   RDW 96.2 95.2 - 84.1 %   Platelet Count 307 150 - 400 K/uL   nRBC 0.0 0.0 - 0.2 %   Neutrophils Relative % 64 %   Neutro Abs 6.5  1.7 -  7.7 K/uL   Lymphocytes Relative 26 %   Lymphs Abs 2.6 0.7 - 4.0 K/uL   Monocytes Relative 6 %   Monocytes Absolute 0.6 0.1 - 1.0 K/uL   Eosinophils Relative 3 %   Eosinophils Absolute 0.3 0.0 - 0.5 K/uL   Basophils Relative 1 %   Basophils Absolute 0.1 0.0 - 0.1 K/uL   Immature Granulocytes 0 %   Abs Immature Granulocytes 0.04 0.00 - 0.07 K/uL      Assessment & Plan:   Problem List Items Addressed This Visit       Other   Severe episode of recurrent major depressive disorder, without psychotic features (HCC) - Primary    Feeling irritated. Will cut her rexaulti back down to 1.5mg  and recheck in about a week. Call with any concerns.         Follow up plan: Return in about 1 week (around 09/07/2023).

## 2023-09-03 ENCOUNTER — Encounter: Payer: Self-pay | Admitting: Family Medicine

## 2023-09-03 NOTE — Assessment & Plan Note (Addendum)
Feeling irritated. Will cut her rexaulti back down to 1.5mg  and recheck in about a week. Call with any concerns.

## 2023-09-07 ENCOUNTER — Ambulatory Visit: Payer: BC Managed Care – PPO | Admitting: Family Medicine

## 2023-09-07 ENCOUNTER — Encounter: Payer: Self-pay | Admitting: Family Medicine

## 2023-09-07 VITALS — BP 106/66 | HR 55

## 2023-09-07 DIAGNOSIS — E039 Hypothyroidism, unspecified: Secondary | ICD-10-CM | POA: Diagnosis not present

## 2023-09-07 DIAGNOSIS — J189 Pneumonia, unspecified organism: Secondary | ICD-10-CM | POA: Diagnosis not present

## 2023-09-07 DIAGNOSIS — F332 Major depressive disorder, recurrent severe without psychotic features: Secondary | ICD-10-CM

## 2023-09-07 MED ORDER — ONDANSETRON 4 MG PO TBDP: 4.0000 mg | ORAL_TABLET | Freq: Three times a day (TID) | ORAL | 0 refills | Status: DC | PRN

## 2023-09-07 MED ORDER — CLONAZEPAM 0.5 MG PO TABS: 0.5000 mg | ORAL_TABLET | Freq: Every day | ORAL | 0 refills | Status: DC | PRN

## 2023-09-07 MED ORDER — AZITHROMYCIN 250 MG PO TABS: ORAL_TABLET | ORAL | 0 refills | Status: AC

## 2023-09-07 NOTE — Assessment & Plan Note (Signed)
Slightly improved. Less irritable on lower dose of rexaulti. Call with any concerns. Recheck 1 month. Continue partial hospitalization program.

## 2023-09-07 NOTE — Assessment & Plan Note (Signed)
Rechecking labs today. Await results. Treat as needed.  °

## 2023-09-07 NOTE — Progress Notes (Signed)
BP 106/66   Pulse (!) 55   LMP 08/09/2023 (Approximate)   SpO2 99%    Subjective:    Patient ID: Sharon Gibson, female    DOB: 09-21-1981, 42 y.o.   MRN: [id number]  HPI: Sharon Gibson is a 42 y.o. female  Chief Complaint  Patient presents with   Depression    Patient is here to follow up on Depression. Patient had a dose change at last visit. Patient says things are going better.    DEPRESSION Mood status: stable Satisfied with current treatment?: yes Symptom severity: severe  Duration of current treatment : chronic Side effects: no Medication compliance: excellent compliance Psychotherapy/counseling: yes current Depressed mood: yes Anxious mood: yes Anhedonia: yes Significant weight loss or gain: no Insomnia: no  Fatigue: yes Feelings of worthlessness or guilt: yes Impaired concentration/indecisiveness: yes Suicidal ideations: yes Hopelessness: yes Crying spells: yes    09/07/2023    4:20 PM 08/31/2023    4:04 PM 08/19/2023   10:57 AM 08/12/2023    4:00 PM 08/06/2023    3:38 PM  Depression screen PHQ 2/9  Decreased Interest 3 3 3 3 3   Down, Depressed, Hopeless 3 3 3 3 3   PHQ - 2 Score 6 6 6 6 6   Altered sleeping 3 3 3 3 3   Tired, decreased energy 3 3 3 3 3   Change in appetite 2 3 3 3 2   Feeling bad or failure about yourself  3 3 3 3 3   Trouble concentrating 3 3 3 3 3   Moving slowly or fidgety/restless 2 3 3 2 1   Suicidal thoughts 3 3 3 3 3   PHQ-9 Score 25 27 27 26 24   Difficult doing work/chores Extremely dIfficult   Extremely dIfficult Extremely dIfficult      09/07/2023    4:21 PM 08/31/2023    4:04 PM 08/19/2023   10:57 AM 08/12/2023    4:00 PM  GAD 7 : Generalized Anxiety Score  Nervous, Anxious, on Edge 3 3 3 3   Control/stop worrying 3 3 3 3   Worry too much - different things 3 3 3 3   Trouble relaxing 3 3 3 3   Restless 2 3 3 3   Easily annoyed or irritable 1 3 2 3   Afraid - awful might happen 3 3 3 3   Total GAD 7 Score 18 21 20 21    Anxiety Difficulty Extremely difficult  Extremely difficult Extremely difficult   UPPER RESPIRATORY TRACT INFECTION Duration: a few days Worst symptom: fatigue, cough Fever: yes Cough: yes Shortness of breath: no Wheezing: no Chest pain: no Chest tightness: yes Chest congestion: no Nasal congestion: no Runny nose: no Post nasal drip: no Sneezing: no Sore throat: no Swollen glands: no Sinus pressure: no Headache: no Face pain: no Toothache: no Ear pain: no  Ear pressure: no  Eyes red/itching:no Eye drainage/crusting: no  Vomiting: no Rash: no Fatigue: yes Sick contacts: yes Strep contacts: no  Context: stable Recurrent sinusitis: no Relief with OTC cold/cough medications: no  Treatments attempted: none  HYPOTHYROIDISM Thyroid control status:stable Satisfied with current treatment? yes Medication side effects: no Medication compliance: excellent compliance Etiology of hypothyroidism:  Recent dose adjustment:yes Fatigue: yes Cold intolerance: no Heat intolerance: no Weight gain: no Weight loss: no Constipation: no Diarrhea/loose stools: no Palpitations: no Lower extremity edema: no Anxiety/depressed mood: yes   Relevant past medical, surgical, family and social history reviewed and updated as indicated. Interim medical history since our last visit reviewed. Allergies and medications reviewed and updated.  Review of Systems  Constitutional: Negative.   Respiratory: Negative.    Cardiovascular: Negative.   Musculoskeletal: Negative.   Psychiatric/Behavioral:  Positive for dysphoric mood. Negative for agitation, behavioral problems, confusion, decreased concentration, hallucinations, self-injury, sleep disturbance and suicidal ideas. The patient is nervous/anxious. The patient is not hyperactive.     Per HPI unless specifically indicated above     Objective:    BP 106/66   Pulse (!) 55   LMP 08/09/2023 (Approximate)   SpO2 99%   Wt Readings from  Last 3 Encounters:  08/23/23 271 lb (122.9 kg)  08/18/23 271 lb (122.9 kg)  07/22/23 271 lb (122.9 kg)    Physical Exam Vitals and nursing note reviewed.  Constitutional:      General: She is not in acute distress.    Appearance: Normal appearance. She is well-developed. She is not ill-appearing, toxic-appearing or diaphoretic.  HENT:     Head: Normocephalic and atraumatic.     Right Ear: Hearing and external ear normal.     Left Ear: Hearing and external ear normal.     Nose: Nose normal.     Mouth/Throat:     Mouth: Mucous membranes are moist.     Pharynx: Oropharynx is clear.  Eyes:     General: Lids are normal. No scleral icterus.       Right eye: No discharge.        Left eye: No discharge.     Conjunctiva/sclera: Conjunctivae normal.  Cardiovascular:     Rate and Rhythm: Normal rate and regular rhythm.     Pulses: Normal pulses.     Heart sounds: Normal heart sounds. No murmur heard.    No friction rub. No gallop.  Pulmonary:     Effort: Pulmonary effort is normal. No respiratory distress.     Breath sounds: No stridor. Rhonchi (fine diffuse rhonchi) present. No wheezing or rales.  Chest:     Chest wall: No tenderness.  Musculoskeletal:        General: Normal range of motion.  Skin:    Coloration: Skin is not jaundiced or pale.     Findings: No bruising, erythema, lesion or rash.  Neurological:     Mental Status: She is alert. Mental status is at baseline. She is disoriented.  Psychiatric:        Mood and Affect: Mood normal.        Speech: Speech normal.        Behavior: Behavior normal.        Thought Content: Thought content normal.        Judgment: Judgment normal.     Results for orders placed or performed in visit on 06/29/23  Folate  Result Value Ref Range   Folate 8.9 >5.9 ng/mL  Vitamin B12  Result Value Ref Range   Vitamin B-12 203 180 - 914 pg/mL  Ferritin  Result Value Ref Range   Ferritin 8 (L) 11 - 307 ng/mL  Iron and TIBC  Result Value  Ref Range   Iron 42 28 - 170 ug/dL   TIBC 161 096 - 045 ug/dL   Saturation Ratios 10 (L) 10.4 - 31.8 %   UIBC 371 ug/dL  CBC with Differential (Cancer Center Only)  Result Value Ref Range   WBC Count 10.1 4.0 - 10.5 K/uL   RBC 4.69 3.87 - 5.11 MIL/uL   Hemoglobin 13.0 12.0 - 15.0 g/dL   HCT 40.9 81.1 - 91.4 %   MCV 87.2 80.0 -  100.0 fL   MCH 27.7 26.0 - 34.0 pg   MCHC 31.8 30.0 - 36.0 g/dL   RDW 16.1 09.6 - 04.5 %   Platelet Count 307 150 - 400 K/uL   nRBC 0.0 0.0 - 0.2 %   Neutrophils Relative % 64 %   Neutro Abs 6.5 1.7 - 7.7 K/uL   Lymphocytes Relative 26 %   Lymphs Abs 2.6 0.7 - 4.0 K/uL   Monocytes Relative 6 %   Monocytes Absolute 0.6 0.1 - 1.0 K/uL   Eosinophils Relative 3 %   Eosinophils Absolute 0.3 0.0 - 0.5 K/uL   Basophils Relative 1 %   Basophils Absolute 0.1 0.0 - 0.1 K/uL   Immature Granulocytes 0 %   Abs Immature Granulocytes 0.04 0.00 - 0.07 K/uL      Assessment & Plan:   Problem List Items Addressed This Visit       Endocrine   Hypothyroidism    Rechecking labs today. Await results. Treat as needed.         Other   Severe episode of recurrent major depressive disorder, without psychotic features (HCC) - Primary    Slightly improved. Less irritable on lower dose of rexaulti. Call with any concerns. Recheck 1 month. Continue partial hospitalization program.       Other Visit Diagnoses     Atypical pneumonia       Will treat with azithromycin. Call with any concerns or if not getting better. Callw ith any concerns.   Relevant Medications   azithromycin (ZITHROMAX) 250 MG tablet        Follow up plan: Return in about 1 week (around 09/14/2023).

## 2023-09-08 ENCOUNTER — Other Ambulatory Visit: Payer: Self-pay | Admitting: Family Medicine

## 2023-09-08 ENCOUNTER — Encounter: Payer: Self-pay | Admitting: Obstetrics

## 2023-09-08 ENCOUNTER — Ambulatory Visit: Payer: BC Managed Care – PPO | Admitting: Obstetrics

## 2023-09-08 VITALS — BP 120/70 | HR 54 | Ht 63.0 in | Wt 279.0 lb

## 2023-09-08 DIAGNOSIS — N83202 Unspecified ovarian cyst, left side: Secondary | ICD-10-CM | POA: Diagnosis not present

## 2023-09-08 DIAGNOSIS — Z3009 Encounter for other general counseling and advice on contraception: Secondary | ICD-10-CM

## 2023-09-08 DIAGNOSIS — N83209 Unspecified ovarian cyst, unspecified side: Secondary | ICD-10-CM

## 2023-09-08 LAB — TSH: TSH: 4.65 u[IU]/mL — ABNORMAL HIGH (ref 0.450–4.500)

## 2023-09-08 MED ORDER — LEVOTHYROXINE SODIUM 75 MCG PO TABS: 75.0000 ug | ORAL_TABLET | Freq: Every day | ORAL | 0 refills | Status: DC

## 2023-09-08 NOTE — Progress Notes (Signed)
    GYNECOLOGY PROGRESS NOTE  Subjective:  PCP: Sharon Carrow, DO  Patient ID: Sharon Gibson, female    DOB: 01-Feb-1981, 42 y.o.   MRN: [id number]  HPI  Patient is a 42 y.o. G0P0000 female who presents for OVARIAN CYST referred by Sharon Perches DO, pt's PCP. Pt had an MRI for her back and incidentally found potential L hemorrhagic ovarian cyst vs hematosalpinx. PCP obtained pelvic US, done 08/13/23 ultrasound found "Two rounded hyperechoic areas are noted in the left ovary, the largest measuring 1.3 cm. Potentially these may represent hemorrhagic cysts."   She had a L ovarian endometrioma approx 5x4cm laparscopically removed in Aug 2019 with Dr. Mohammed Gibson. Endometrioma had become severely painful. Currently does have some mild, intermittent pelvic pain that she attributes to the L ovarian cysts, but was not seeking care for them or extremely bothered by them.   Pt states her most recent period has lasted a little longer then normal. Is normally Q28-30 days, periods last 2-3 days, and flow is light. She is currently on her period that started on 09/04/23, making today cycle day 5 and she's still having some spotting. Not taking any contraception is not currently sexually active. Did have Mirena IUD x 2, but Dr. Jerene Gibson was unable to get the 3rd one inserted after 3 attempts in clinic. OR insertion offered, but insurance wouldn't cover and pt unable to pay out of pocket. She does still desire some type of contraception and is open to other options. 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   The following portions of the patient's history were reviewed and updated as appropriate: allergies, current medications, past family history, past medical history, past social history, past surgical history, and problem list.  Review of Systems Pertinent items are noted in HPI.   Objective:   Blood pressure 120/70, pulse (!) 54, height 5\' 3"  (1.6 m), weight 279 lb (126.6  kg), last menstrual period 09/04/2023. Body mass index is 49.42 kg/m.  General appearance: alert, cooperative, and morbidly obese Pelvic: deferred Extremities: extremities normal, atraumatic, no cyanosis or edema Neurologic: Grossly normal  Assessment/Plan:   1. Hemorrhagic ovarian cyst    Sharon Gibson is a 42 y.o. G0P0000 with a hx of L endomtrioma resection in 2019, with new incidentally found L hemorrhagic cysts that are not symptomatic enough at this time to warrant surgical intervention. We discussed that these could be recurrent or new endometriomas and may warrant removal in the future if they cause her worsening symptoms like prior. We will repeat US in 12wks from prior for interval surveillance and contact her with results and further management recommendations at that time.   She is also interested in re-starting a progestin-only contraceptive (due to her hx of PE) and we discussed her various options, she is most interested in Nexplanon and will call to schedule insertion when she's ready.    Sharon Manson, DO [facility]

## 2023-09-14 ENCOUNTER — Encounter: Payer: Self-pay | Admitting: Family Medicine

## 2023-09-14 ENCOUNTER — Ambulatory Visit (INDEPENDENT_AMBULATORY_CARE_PROVIDER_SITE_OTHER): Payer: BC Managed Care – PPO | Admitting: Family Medicine

## 2023-09-14 VITALS — BP 101/66 | HR 63

## 2023-09-14 DIAGNOSIS — F332 Major depressive disorder, recurrent severe without psychotic features: Secondary | ICD-10-CM | POA: Diagnosis not present

## 2023-09-14 NOTE — Progress Notes (Signed)
BP 101/66   Pulse 63   LMP 09/04/2023 (Approximate)   SpO2 97%    Subjective:    Patient ID: Sharon Gibson, female    DOB: 1981-06-15, 42 y.o.   MRN: [id number]  HPI: Sharon Gibson is a 42 y.o. female  Chief Complaint  Patient presents with   Depression    Patient is here for one week follow up on Depression.    DEPRESSION Mood status: stable Satisfied with current treatment?: yes Symptom severity: severe  Duration of current treatment : chronic Side effects: no Medication compliance: excellent compliance Psychotherapy/counseling: yes current Depressed mood: yes Anxious mood: yes Anhedonia: no Significant weight loss or gain: no Insomnia: no  Fatigue: yes Feelings of worthlessness or guilt: yes Impaired concentration/indecisiveness: yes Suicidal ideations: yes Hopelessness: yes Crying spells: yes    09/14/2023    3:25 PM 09/07/2023    4:20 PM 08/31/2023    4:04 PM 08/19/2023   10:57 AM 08/12/2023    4:00 PM  Depression screen PHQ 2/9  Decreased Interest 3 3 3 3 3   Down, Depressed, Hopeless 3 3 3 3 3   PHQ - 2 Score 6 6 6 6 6   Altered sleeping 3 3 3 3 3   Tired, decreased energy 3 3 3 3 3   Change in appetite 3 2 3 3 3   Feeling bad or failure about yourself  3 3 3 3 3   Trouble concentrating 3 3 3 3 3   Moving slowly or fidgety/restless 3 2 3 3 2   Suicidal thoughts 3 3 3 3 3   PHQ-9 Score 27 25 27 27 26   Difficult doing work/chores Extremely dIfficult Extremely dIfficult   Extremely dIfficult      09/14/2023    3:25 PM 09/07/2023    4:21 PM 08/31/2023    4:04 PM 08/19/2023   10:57 AM  GAD 7 : Generalized Anxiety Score  Nervous, Anxious, on Edge 3 3 3 3   Control/stop worrying 3 3 3 3   Worry too much - different things 3 3 3 3   Trouble relaxing 3 3 3 3   Restless 3 2 3 3   Easily annoyed or irritable 1 1 3 2   Afraid - awful might happen 3 3 3 3   Total GAD 7 Score 19 18 21 20   Anxiety Difficulty Extremely difficult Extremely difficult  Extremely  difficult    Relevant past medical, surgical, family and social history reviewed and updated as indicated. Interim medical history since our last visit reviewed. Allergies and medications reviewed and updated.  Review of Systems  Constitutional: Negative.   Respiratory: Negative.    Cardiovascular: Negative.   Musculoskeletal: Negative.   Skin: Negative.   Psychiatric/Behavioral:  Positive for dysphoric mood. Negative for agitation, behavioral problems, confusion, decreased concentration, hallucinations, self-injury, sleep disturbance and suicidal ideas. The patient is nervous/anxious. The patient is not hyperactive.     Per HPI unless specifically indicated above     Objective:    BP 101/66   Pulse 63   LMP 09/04/2023 (Approximate)   SpO2 97%   Wt Readings from Last 3 Encounters:  09/08/23 279 lb (126.6 kg)  08/23/23 271 lb (122.9 kg)  08/18/23 271 lb (122.9 kg)    Physical Exam Vitals and nursing note reviewed.  Constitutional:      General: She is not in acute distress.    Appearance: Normal appearance. She is not ill-appearing, toxic-appearing or diaphoretic.  HENT:     Head: Normocephalic and atraumatic.     Right Ear:  External ear normal.     Left Ear: External ear normal.     Nose: Nose normal.     Mouth/Throat:     Mouth: Mucous membranes are moist.     Pharynx: Oropharynx is clear.  Eyes:     General: No scleral icterus.       Right eye: No discharge.        Left eye: No discharge.     Extraocular Movements: Extraocular movements intact.     Conjunctiva/sclera: Conjunctivae normal.     Pupils: Pupils are equal, round, and reactive to light.  Cardiovascular:     Rate and Rhythm: Normal rate and regular rhythm.     Pulses: Normal pulses.     Heart sounds: Normal heart sounds. No murmur heard.    No friction rub. No gallop.  Pulmonary:     Effort: Pulmonary effort is normal. No respiratory distress.     Breath sounds: Normal breath sounds. No stridor. No  wheezing, rhonchi or rales.  Chest:     Chest wall: No tenderness.  Musculoskeletal:        General: Normal range of motion.     Cervical back: Normal range of motion and neck supple.  Skin:    General: Skin is warm and dry.     Capillary Refill: Capillary refill takes less than 2 seconds.     Coloration: Skin is not jaundiced or pale.     Findings: No bruising, erythema, lesion or rash.  Neurological:     General: No focal deficit present.     Mental Status: She is alert and oriented to person, place, and time. Mental status is at baseline.  Psychiatric:        Mood and Affect: Mood normal.        Behavior: Behavior normal.        Thought Content: Thought content normal.        Judgment: Judgment normal.     Results for orders placed or performed in visit on 09/07/23  TSH   Collection Time: 09/07/23  4:29 PM  Result Value Ref Range   TSH 4.650 (H) 0.450 - 4.500 uIU/mL      Assessment & Plan:   Problem List Items Addressed This Visit       Other   Severe episode of recurrent major depressive disorder, without psychotic features (HCC) - Primary   Stable. Continuing counseling and intensive outpatient therapy. Will recheck next week. Continue close monitoring. Has consult with psychiatrist tomorrow. Await their input.        Follow up plan: Return for 12/26 or 12/27- OK to double book.

## 2023-09-14 NOTE — Assessment & Plan Note (Addendum)
Stable. Continuing counseling and intensive outpatient therapy. Will recheck next week. Continue close monitoring. Has consult with psychiatrist tomorrow. Await their input.

## 2023-09-20 ENCOUNTER — Encounter: Payer: Self-pay | Admitting: Student in an Organized Health Care Education/Training Program

## 2023-09-20 ENCOUNTER — Ambulatory Visit
Payer: BC Managed Care – PPO | Attending: Student in an Organized Health Care Education/Training Program | Admitting: Student in an Organized Health Care Education/Training Program

## 2023-09-20 VITALS — BP 112/50 | HR 58 | Temp 97.2°F | Resp 16 | Ht 63.0 in

## 2023-09-20 DIAGNOSIS — M533 Sacrococcygeal disorders, not elsewhere classified: Secondary | ICD-10-CM | POA: Diagnosis present

## 2023-09-20 DIAGNOSIS — M5416 Radiculopathy, lumbar region: Secondary | ICD-10-CM | POA: Diagnosis present

## 2023-09-20 DIAGNOSIS — M4306 Spondylolysis, lumbar region: Secondary | ICD-10-CM | POA: Diagnosis present

## 2023-09-20 DIAGNOSIS — G8929 Other chronic pain: Secondary | ICD-10-CM

## 2023-09-20 DIAGNOSIS — M4317 Spondylolisthesis, lumbosacral region: Secondary | ICD-10-CM | POA: Diagnosis present

## 2023-09-20 NOTE — Progress Notes (Signed)
Safety precautions to be maintained throughout the outpatient stay will include: orient to surroundings, keep bed in low position, maintain call bell within reach at all times, provide assistance with transfer out of bed and ambulation.  

## 2023-09-20 NOTE — Progress Notes (Signed)
PROVIDER NOTE: Information contained herein reflects review and annotations entered in association with encounter. Interpretation of such information and data should be left to medically-trained personnel. Information provided to patient can be located elsewhere in the medical record under "Patient Instructions". Document created using STT-dictation technology, any transcriptional errors that may result from process are unintentional.    Patient: Sharon Gibson  Service Category: E/M  Provider: Edward Jolly, MD  DOB: [birth year]  DOS: 09/20/2023  Referring Provider: Dorcas Carrow DO  MRN: [id number]  Specialty: Interventional Pain Management  PCP: Dorcas Carrow, DO  Type: Established Patient  Setting: Ambulatory outpatient    Location: Office  Delivery: Face-to-face     HPI  Ms. Sharon Gibson, a 42 y.o. year old female, is here today because of her Chronic radicular lumbar pain [M54.16, G89.29]. Ms. Sharon Gibson primary complain today is left leg pain and Back Pain  Pertinent problems: Ms. Sharon Gibson has S/P gastric bypass; Chronic radicular lumbar pain; Pars defect of lumbar spine; Spondylolisthesis at L5-S1 level; and Sacroiliac joint pain (left) on their pertinent problem list. Pain Assessment: Severity of Chronic pain is reported as a 4 /10. Location: Back Lower/denies. Onset: More than a month ago. Quality: Sharp. Timing: Intermittent. Modifying factor(s): pain does not wake me up. Vitals:  height is 5\' 3"  (1.6 m). Her temperature is 97.2 F (36.2 C) (abnormal). Her blood pressure is 112/50 (abnormal) and her pulse is 58 (abnormal). Her respiration is 16 and oxygen saturation is 99%.  BMI: Estimated body mass index is 49.42 kg/m as calculated from the following:   Height as of this encounter: 5\' 3"  (1.6 m).   Weight as of 09/08/23: 279 lb (126.6 kg). Last encounter: 08/18/2023. Last procedure: 08/23/2023.  Reason for encounter: post-procedure evaluation and assessment.     Post-procedure evaluation    Procedure: Lumbar trans-foraminal epidural steroid injection (L-TFESI) #1  Laterality: Left (-LT)  Level: L5 nerve root(s) Imaging: Fluoroscopy-guided         Anesthesia: Local anesthesia (1-2% Lidocaine) Sedation: Minimal Sedation                       DOS: 08/23/2023  Performed by: Edward Jolly, MD  Purpose: Diagnostic/Therapeutic Indications: Lumbar radicular pain severe enough to impact quality of life or function. 1. Chronic radicular lumbar pain   2. Pars defect of lumbar spine (L5)    NAS-11 Pain score:   Pre-procedure: 2 /10   Post-procedure: 0-No pain/10     Effectiveness:  Initial hour after procedure: 100 %  Subsequent 4-6 hours post-procedure: 25 %  Analgesia past initial 6 hours: 75 %  Ongoing improvement:  Analgesic:  75% however pain is returning Function: Somewhat improved ROM: Somewhat improved   ROS  Constitutional: Denies any fever or chills Gastrointestinal: No reported hemesis, hematochezia, vomiting, or acute GI distress Musculoskeletal:  low back and LEFT buttock and leg pain Neurological: No reported episodes of acute onset apraxia, aphasia, dysarthria, agnosia, amnesia, paralysis, loss of coordination, or loss of consciousness  Medication Review  Brexpiprazole, Vitamin D (Ergocalciferol), clonazePAM, gabapentin, levothyroxine, metroNIDAZOLE, ondansetron, sertraline, and tiZANidine  History Review  Allergy: Ms. Sharon Gibson is allergic to penicillins, strawberry (diagnostic), other, celebrex [celecoxib], nsaids, and azithromycin. Drug: Ms. Sharon Gibson  reports no history of drug use. Alcohol:  reports that she does not currently use alcohol. Tobacco:  reports that she has never smoked. She has never used smokeless tobacco. Social: Ms. Sharon Gibson  reports that she has never smoked. She has never  used smokeless tobacco. She reports that she does not currently use alcohol. She reports that she does not use  drugs. Medical:  has a past medical history of Anemia, Anxiety, Chicken pox, Depression, Eating disorder, Heart murmur, History of anemia, History of pulmonary embolus (PE) (2002), Hypothyroidism, and PTSD (post-traumatic stress disorder). Surgical: Ms. Sharon Gibson  has a past surgical history that includes Gastric bypass (2003); Cholecystectomy (2003); Laparoscopic ovarian cystectomy (Left, 05/10/2018); and Excision of endometrioma. Family: family history includes Alzheimer's disease in her maternal grandfather and maternal grandmother; Anxiety disorder in her father; Depression in her father; Diabetes in her father; Lung cancer in her paternal grandmother; Prostate cancer in her paternal grandfather.  Laboratory Chemistry Profile   Renal Lab Results  Component Value Date   BUN 14 04/30/2023   CREATININE 0.79 04/30/2023   BCR 18 04/30/2023   GFR 107.67 04/06/2016   GFRAA 105 08/16/2020   GFRNONAA >60 12/18/2022    Hepatic Lab Results  Component Value Date   AST 18 04/30/2023   ALT 17 04/30/2023   ALBUMIN 4.3 04/30/2023   ALKPHOS 89 04/30/2023   LIPASE 29 02/06/2018    Electrolytes Lab Results  Component Value Date   NA 141 04/30/2023   K 3.9 04/30/2023   CL 103 04/30/2023   CALCIUM 9.2 04/30/2023    Bone Lab Results  Component Value Date   VD25OH 15.7 (L) 04/30/2023    Inflammation (CRP: Acute Phase) (ESR: Chronic Phase) Lab Results  Component Value Date   ESRSEDRATE 8 06/03/2022   LATICACIDVEN 0.8 02/07/2018         Note: Above Lab results reviewed.  Recent Imaging Review      DG PAIN CLINIC C-ARM 1-60 MIN NO REPORT Fluoro was used, but no Radiologist interpretation will be provided.  Please refer to "NOTES" tab for provider progress note. US Pelvic Complete With Transvaginal CLINICAL DATA:  Abnormality seen on MRI.  EXAM: TRANSABDOMINAL AND TRANSVAGINAL ULTRASOUND OF PELVIS  TECHNIQUE: Both transabdominal and transvaginal ultrasound examinations of  the pelvis were performed. Transabdominal technique was performed for global imaging of the pelvis including uterus, ovaries, adnexal regions, and pelvic cul-de-sac. It was necessary to proceed with endovaginal exam following the transabdominal exam to visualize the endometrium and ovaries.  COMPARISON:  MRI of July 27, 2023. Pelvic ultrasound of March 30, 2018.  FINDINGS: Uterus  Measurements: 7.7 x 5.5 x 3.7 cm = volume: 83 mL. No fibroids or other mass visualized.  Endometrium  Thickness: 5 mm which is within normal limits. No focal abnormality visualized.  Right ovary  Measurements: 4.2 x 2.5 x 1.9 cm = volume: 10 mL. Normal appearance/no adnexal mass.  Left ovary  Measurements: 4.4 x 3.5 x 3.3 cm = volume: 26 mL. Two rounded hyperechoic structures are noted in the left ovary, the largest measuring 1.3 cm.  Other findings  Trace free fluid is noted which most likely is physiologic.  IMPRESSION: Two rounded hyperechoic areas are noted in the left ovary, the largest measuring 1.3 cm. Potentially these may represent hemorrhagic cysts. Short-interval follow up ultrasound in 6-12 weeks is recommended, preferably during the week following the patient's normal menses.  Electronically Signed   By: Lupita Raider M.D.   On: 08/23/2023 12:16   MR Lumbar Spine Wo Contrast   Narrative CLINICAL DATA:  Low back and left leg pain for 3 months.   EXAM: MRI LUMBAR SPINE WITHOUT CONTRAST   TECHNIQUE: Multiplanar, multisequence MR imaging of the lumbar spine was performed. No  intravenous contrast was administered.   COMPARISON:  Radiographs 07/13/2023   FINDINGS: Segmentation: There are five lumbar type vertebral bodies. The last full intervertebral disc space is labeled L5-S1. This correlates with the radiographs.   Alignment: Bilateral pars defects at L5 with a grade 1 spondylolisthesis. The other lumbar vertebral bodies are normally aligned.   Vertebrae:   Normal marrow signal.  No bone lesions or fractures.   Conus medullaris and cauda equina: Conus extends to the L1-2 level. Conus and cauda equina appear normal.   Paraspinal and other soft tissues: No significant paraspinal or retroperitoneal findings are identified. There is a curvilinear structure in the pelvis left of midline which demonstrates high T1 signal intensity and low T2 signal intensity. Findings could be a hemorrhagic ovarian cyst or possibly hematosalpinx. Pelvic ultrasound may be helpful for further evaluation.   Disc levels:   No significant findings from T12-L1 down through L4-5. No disc protrusions, spinal foraminal stenosis. There is moderate right-sided facet disease noted at L4-5.   L5-S1: Bilateral pars defects with associated grade 1 spondylolisthesis and advanced facet disease, right greater than left. There is a bulging uncovered annulus along with a shallow left paracentral and foraminal disc protrusion. Potential irritation of the left L5 nerve root. No spinal stenosis as the spinal canal is quite generous.   IMPRESSION: 1. Bilateral pars defects at L5 with associated grade 1 spondylolisthesis and advanced facet disease, right greater than left. 2. Bulging uncovered annulus along with a shallow left paracentral and foraminal disc protrusion at L5-S1. Potential irritation of the left L5 nerve root. 3. No significant findings from T12-L1 down through L4-5. 4. Curvilinear structure in the pelvis left of midline which demonstrates high T1 signal intensity and low T2 signal intensity. Findings could be a hemorrhagic ovarian cyst or possibly hematosalpinx. Pelvic ultrasound may be helpful for further evaluation.     Electronically Signed By: Rudie Meyer M.D. On: 08/05/2023 10:04   Note: Reviewed        Physical Exam  General appearance: Well nourished, well developed, and well hydrated. In no apparent acute distress Mental status: Alert,  oriented x 3 (person, place, & time)       Respiratory: No evidence of acute respiratory distress Eyes: PERLA Vitals: BP (!) 112/50   Pulse (!) 58   Temp (!) 97.2 F (36.2 C)   Resp 16   Ht 5\' 3"  (1.6 m)   LMP 09/04/2023 (Approximate)   SpO2 99%   BMI 49.42 kg/m  BMI: Estimated body mass index is 49.42 kg/m as calculated from the following:   Height as of this encounter: 5\' 3"  (1.6 m).   Weight as of 09/08/23: 279 lb (126.6 kg). Ideal: Ideal body weight: 52.4 kg (115 lb 8.3 oz) Adjusted ideal body weight: 82.1 kg (180 lb 14.6 oz)  Lumbar Spine Area Exam  Skin & Axial Inspection: No masses, redness, or swelling Alignment: Symmetrical Functional ROM: Pain restricted ROM affecting primarily the left Stability: No instability detected Muscle Tone/Strength: Functionally intact. No obvious neuro-muscular anomalies detected. Sensory (Neurological): Dermatomal pain pattern Palpation: Complains of area being tender to palpation       Provocative Tests: Hyperextension/rotation test: deferred today       Lumbar quadrant test (Kemp's test): (+) on the left for foraminal stenosis Lateral bending test: (+) ipsilateral radicular pain, on the left. Positive for left-sided foraminal stenosis.   Gait & Posture Assessment  Ambulation: Unassisted Gait: Relatively normal for age and body habitus Posture: WNL  Lower Extremity Exam      Side: Right lower extremity   Side: Left lower extremity  Stability: No instability observed           Stability: No instability observed          Skin & Extremity Inspection: Skin color, temperature, and hair growth are WNL. No peripheral edema or cyanosis. No masses, redness, swelling, asymmetry, or associated skin lesions. No contractures.   Skin & Extremity Inspection: Skin color, temperature, and hair growth are WNL. No peripheral edema or cyanosis. No masses, redness, swelling, asymmetry, or associated skin lesions. No contractures.  Functional ROM:  Unrestricted ROM                   Functional ROM: Unrestricted ROM                  Muscle Tone/Strength: Functionally intact. No obvious neuro-muscular anomalies detected.   Muscle Tone/Strength: Functionally intact. No obvious neuro-muscular anomalies detected.  Sensory (Neurological): Unimpaired         Sensory (Neurological): Unimpaired        DTR: Patellar: deferred today Achilles: deferred today Plantar: deferred today   DTR: Patellar: deferred today Achilles: deferred today Plantar: deferred today  Palpation: No palpable anomalies   Palpation: No palpable anomalies    Assessment   Diagnosis Status  1. Chronic radicular lumbar pain   2. Pars defect of lumbar spine (L5)   3. Sacroiliac joint pain (left)   4. Spondylolisthesis at L5-S1 level    Responding Persistent Persistent    Plan of Care  Positive response to previous left L5 transforaminal epidural steroid injection.  Please see details above.  We discussed repeating lumbar transforaminal ESI #2.  Risks and benefits reviewed and patient would like to proceed.  Orders:  Orders Placed This Encounter  Procedures   Lumbar Transforaminal Epidural    Standing Status:   Future    Expected Date:   10/18/2023    Expiration Date:   12/19/2023    Scheduling Instructions:     Laterality: Left L5         Sedation: PO Valium 10 mg     Timeframe: ASAP    Where will this procedure be performed?:   [facility]   Follow-up plan:   Return in about 3 weeks (around 10/11/2023) for Left L5 TF ESI, in clinic (PO Valium 10mg ).      Left L5 TF ESI     Recent Visits Date Type Provider Dept  08/23/23 Procedure visit Edward Jolly, MD [facility]  08/18/23 Office Visit Edward Jolly, MD [facility]  Showing recent visits within past 90 days and meeting all other requirements Today's Visits Date Type Provider Dept  09/20/23 Office Visit Edward Jolly, MD [facility]  Showing today's visits  and meeting all other requirements Future Appointments No visits were found meeting these conditions. Showing future appointments within next 90 days and meeting all other requirements  I discussed the assessment and treatment plan with the patient. The patient was provided an opportunity to ask questions and all were answered. The patient agreed with the plan and demonstrated an understanding of the instructions.  Patient advised to call back or seek an in-person evaluation if the symptoms or condition worsens.  Duration of encounter: .  Total time on encounter, as per AMA guidelines included both the face-to-face and non-face-to-face time personally spent by the physician and/or other qualified health care professional(s) on  the day of the encounter (includes time in activities that require the physician or other qualified health care professional and does not include time in activities normally performed by clinical staff). Physician's time may include the following activities when performed: Preparing to see the patient (e.g., pre-charting review of records, searching for previously ordered imaging, lab work, and nerve conduction tests) Review of prior analgesic pharmacotherapies. Reviewing PMP Interpreting ordered tests (e.g., lab work, imaging, nerve conduction tests) Performing post-procedure evaluations, including interpretation of diagnostic procedures Obtaining and/or reviewing separately obtained history Performing a medically appropriate examination and/or evaluation Counseling and educating the patient/family/caregiver Ordering medications, tests, or procedures Referring and communicating with other health care professionals (when not separately reported) Documenting clinical information in the electronic or other health record Independently interpreting results (not separately reported) and communicating results to the patient/ family/caregiver Care coordination (not  separately reported)  Note by: Edward Jolly, MD Date: 09/20/2023; Time: 3:17 PM

## 2023-09-20 NOTE — Patient Instructions (Signed)

## 2023-09-23 ENCOUNTER — Ambulatory Visit: Payer: BC Managed Care – PPO | Admitting: Family Medicine

## 2023-09-24 ENCOUNTER — Ambulatory Visit: Payer: BC Managed Care – PPO | Admitting: Family Medicine

## 2023-09-24 ENCOUNTER — Encounter: Payer: Self-pay | Admitting: Family Medicine

## 2023-09-24 VITALS — BP 126/81 | HR 66 | Temp 98.3°F

## 2023-09-24 DIAGNOSIS — F332 Major depressive disorder, recurrent severe without psychotic features: Secondary | ICD-10-CM | POA: Diagnosis not present

## 2023-09-24 DIAGNOSIS — Z91410 Personal history of adult physical and sexual abuse: Secondary | ICD-10-CM
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
## 2023-09-24 NOTE — Assessment & Plan Note (Signed)
Improving. Continue to follow with psychiatry through [facility]. Call with any concerns.

## 2023-09-26 ENCOUNTER — Other Ambulatory Visit: Payer: Self-pay | Admitting: Family Medicine

## 2023-09-27 ENCOUNTER — Telehealth: Payer: Self-pay | Admitting: *Deleted

## 2023-09-27 ENCOUNTER — Encounter: Payer: Self-pay | Admitting: Oncology

## 2023-09-27 NOTE — Progress Notes (Unsigned)
Complex Care Management Note Care Guide Note  09/27/2023 Name: Sharon Gibson MRN: [id number] DOB: 1981/09/12   Complex Care Management Outreach Attempts: An unsuccessful telephone outreach was attempted today to offer the patient information about available complex care management services.  Follow Up Plan:  Additional outreach attempts will be made to offer the patient complex care management information and services.   Encounter Outcome:  No Answer  Burman Nieves, CCMA Care Coordination Care Guide Direct Dial: [phone]

## 2023-09-28 NOTE — Progress Notes (Signed)
 Complex Care Management Note Care Guide Note  09/28/2023 Name: Lucila Klecka MRN: [id number] DOB: October 02, 1980   Complex Care Management Outreach Attempts: A second unsuccessful outreach was attempted today to offer the patient with information about available complex care management services.  Follow Up Plan:  Additional outreach attempts will be made to offer the patient complex care management information and services.   Encounter Outcome:  No Answer  Thedford Franks, CCMA Care Coordination Care Guide Direct Dial: [phone]

## 2023-09-30 ENCOUNTER — Encounter: Payer: Self-pay | Admitting: Oncology

## 2023-09-30 NOTE — Telephone Encounter (Signed)
 Requested medication (s) are due for refill today: due 10/06/23  Requested medication (s) are on the active medication list: yes    Last refill: 08/06/23  #60  1 refill  Future visit scheduled yes 10/05/23  Notes to clinic:Not delegated, please review  Requested Prescriptions  Pending Prescriptions Disp Refills   tiZANidine  (ZANAFLEX ) 4 MG tablet [Pharmacy Med Name: TIZANIDINE  HCL 4 MG TABLET] 30 tablet 1    Sig: TAKE 1 TABLET BY MOUTH AT BEDTIME AS NEEDED FOR MUSCLE SPASMS.     Not Delegated - Cardiovascular:  Alpha-2 Agonists - tizanidine  Failed - 09/30/2023  1:59 PM      Failed - This refill cannot be delegated      Passed - Valid encounter within last 6 months    Recent Outpatient Visits           6 days ago Severe episode of recurrent major depressive disorder, without psychotic features (HCC)   [place] [facility] [place], Megan P, DO   2 weeks ago Severe episode of recurrent major depressive disorder, without psychotic features (HCC)   [place] [facility] [place], Megan P, DO   3 weeks ago Severe episode of recurrent major depressive disorder, without psychotic features (HCC)   [place] [facility] [place], Megan P, DO   1 month ago Severe episode of recurrent major depressive disorder, without psychotic features (HCC)   [place] [facility] [place], Megan P, DO   1 month ago Severe episode of recurrent major depressive disorder, without psychotic features [facility])    [facility] [place], Duwaine SQUIBB, DO       Future Appointments             In 2 weeks Vic[place], Duwaine SQUIBB, DO  [facility]

## 2023-10-01 ENCOUNTER — Other Ambulatory Visit: Payer: Self-pay | Admitting: Family Medicine

## 2023-10-01 ENCOUNTER — Encounter: Payer: Self-pay | Admitting: Oncology

## 2023-10-01 DIAGNOSIS — F332 Major depressive disorder, recurrent severe without psychotic features: Secondary | ICD-10-CM

## 2023-10-01 DIAGNOSIS — F4310 Post-traumatic stress disorder, unspecified: Secondary | ICD-10-CM

## 2023-10-04 NOTE — Progress Notes (Signed)
 Complex Care Management Note Care Guide Note  10/04/2023 Name: Sharon Gibson MRN: [id number] DOB: [birth year]   Complex Care Management Outreach Attempts: A third unsuccessful outreach was attempted today to offer the patient with information about available complex care management services.  Follow Up Plan:  No further outreach attempts will be made at this time. We have been unable to contact the patient to offer or enroll patient in complex care management services.  Encounter Outcome:  No Answer  Thedford Franks, CCMA Care Coordination Care Guide Direct Dial: [phone]

## 2023-10-04 NOTE — Telephone Encounter (Signed)
 Requested Prescriptions  Pending Prescriptions Disp Refills   sertraline  (ZOLOFT ) 100 MG tablet [Pharmacy Med Name: SERTRALINE  HCL 100 MG TABLET] 225 tablet 0    Sig: TAKE 2.5 TABLETS (250 MG TOTAL) BY MOUTH AS DIRECTED.     Psychiatry:  Antidepressants - SSRI - sertraline  Passed - 10/04/2023  3:39 PM      Passed - AST in normal range and within 360 days    AST  Date Value Ref Range Status  04/30/2023 18 0 - 40 IU/L Final         Passed - ALT in normal range and within 360 days    ALT  Date Value Ref Range Status  04/30/2023 17 0 - 32 IU/L Final         Passed - Completed PHQ-2 or PHQ-9 in the last 360 days      Passed - Valid encounter within last 6 months    Recent Outpatient Visits           1 week ago Severe episode of recurrent major depressive disorder, without psychotic features (HCC)   [facility] [place], Megan P, DO   2 weeks ago Severe episode of recurrent major depressive disorder, without psychotic features (HCC)   [facility] [place], Megan P, DO   3 weeks ago Severe episode of recurrent major depressive disorder, without psychotic features (HCC)   [facility] [place], Megan P, DO   1 month ago Severe episode of recurrent major depressive disorder, without psychotic features (HCC)   [place] [facility] [place], Megan P, DO   1 month ago Severe episode of recurrent major depressive disorder, without psychotic features [facility])   [place] [facility] [place], Duwaine SQUIBB, DO       Future Appointments             In 1 week Vicci, Duwaine SQUIBB, DO [facility], [facility]

## 2023-10-05 ENCOUNTER — Encounter: Payer: Self-pay | Admitting: Pediatrics

## 2023-10-05 ENCOUNTER — Ambulatory Visit: Payer: 59 | Admitting: Pediatrics

## 2023-10-05 VITALS — BP 105/71 | HR 54 | Temp 98.4°F | Resp 14

## 2023-10-05 DIAGNOSIS — Z3202 Encounter for pregnancy test, result negative: Secondary | ICD-10-CM

## 2023-10-05 DIAGNOSIS — Z30017 Encounter for initial prescription of implantable subdermal contraceptive: Secondary | ICD-10-CM

## 2023-10-05 LAB — PREGNANCY, URINE: Preg Test, Ur: NEGATIVE

## 2023-10-05 NOTE — Patient Instructions (Signed)
   [facility] Address: [address] [place] [address] Phone: [phone]   Nexplanon  (progestin implant) take-home sheet  The implant starts working in 7 days to prevent pregnancy. You should use a back-up method for the first 7 days after implant placement unless your period started less than 5 days ago. If your period started less than 5 days ago, the implant starts working right away, and you do not need a backup method.  The implant can remain under your skin for 5 years. Removal date: __1/7/2032______ (5 years from today)  Things to know: Common side effects include: Irregular bleeding. Your periods may change. You may have more bleeding, less bleeding, or no bleeding, and periods may last longer than usual. Bruising and swelling at site are common in the first 24 hours. Keep the dressing on for 24 hours. After 24 hours, you can remove the dressing and take a shower or bath. You can check the implant by pressing your fingertips over the skin where the implant was inserted. You should feel a small rod. If you do not feel your implant, call your clinician.  You may return to school or work after your visit.    The implant does NOT protect against sexually transmitted infections (STIs). You should use latex condoms and/or dental dams to prevent STIs. Most people should get tested for STIs once a year.   Warning Signs: Within First Week Redness, warmth, or drainage from insertion site Fever (>101 degrees)  At Any Time Feeling pregnant (breast pain, nausea) Positive home pregnancy test  If you develop any of the above warning signs you should be seen by a clinician. You can call us  at [phone], or go to your usual clinician.

## 2023-10-05 NOTE — Progress Notes (Signed)
 Nexplanon  Insertion Procedure Note  Sharon Gibson is a 43 y.o. female presenting for Nexplanon  Insertion.   Indication: contraception  Preparation:  Urine pregnancy test result: Negative  The risks, benefits, and alternatives to the procedure were discussed in detail and electronic informed consent was obtained. Risks of insertion include loss of pregnancy if pregnant, damage to local structures including damage to nerves and blood vessels that may require surgical repair, bleeding and pain during and after the procedure, infection, failure of proper deployment. Risks after placement include pain, pregnancy (rare), allergic reaction to substances used during insertion. Risks of removal include damage to local structures, bleeding, pain, infection, scarring.   Patient denied allergies to lidocaine  or iodine.   Time out was performed at 1611 with double identifiers and MA/RN present.   Procedure Details:  Patient was placed in the supine position with non-dominant arm flexed at the elbow and externally rotated.  Bicipital groove palpated.  Insertion site 8-10 cm proximal to medial epicondyle and 3 cm posterior to bicipital groove was marked with sterile marker.  Area of insertion was cleaned with alcohol x 3 and anesthetized with 5 mL of Lidocaine .  The area was then cleaned with chlorhexidine  x 3. Visual confirmation of Nexplanon  in the applicator device. Nexplanon  inserted into the subdermal connective tissue.  Confirmed placement by palpating both ends of implant in the patient's arm.  Pressure bandage with sterile gauze applied to insertion site, followed by application of adhesive bandage.  Patient was taught how to palpate the device.  User card was completed and given to patient.   Complications: none  Lot # : [id number] Exp date: 07/28/2025 Removal date: 10/04/2028   Post-procedure: standard post procedure care instructions were given to the patient. Patient aware that removal of  Nexplanon  is due in 5 years. Routine health maintenance exams recommended.  Patient to follow-up as needed.   Jasiah Elsen SHAUNNA NETT, MD

## 2023-10-05 NOTE — Progress Notes (Signed)
 Time out procedure as follows has been performed:  1) CMA  began time out procedure at 1611 2) Provider and CMA ensure introduction to patient have taken place 3) All other activity in room ceases during remainder of timeout procedure 4) Provider and CMA perform proper patient identification including confirming patient name and date of birth with patient directly by having patient state full name and date of birth out load 5) Provider and CMA review patient allergies, if applicable 6) Provider and CMA confirm procedure name and procedure site 7) Provider confirms correct procedure site by marking site, if applicable 8) Confirm specimen plans, if applicable 9) Patient, Provider and CMA confirms negative pregnancy test result. 10) Provider and CMA confirm patient ready to proceed

## 2023-10-14 ENCOUNTER — Ambulatory Visit: Payer: 59 | Admitting: Family Medicine

## 2023-10-14 ENCOUNTER — Encounter: Payer: Self-pay | Admitting: Family Medicine

## 2023-10-14 VITALS — BP 112/66 | HR 60 | Temp 98.7°F

## 2023-10-14 DIAGNOSIS — F332 Major depressive disorder, recurrent severe without psychotic features: Secondary | ICD-10-CM | POA: Diagnosis not present

## 2023-10-14 MED ORDER — CLONAZEPAM 0.5 MG PO TABS: 0.5000 mg | ORAL_TABLET | Freq: Every day | ORAL | 0 refills | Status: DC | PRN

## 2023-10-14 NOTE — Assessment & Plan Note (Signed)
In exacerbation. Still in intensive outpatient program. Continue to follow with them as meds are adjusted. Will try small goals this week- applying for new job and going to 2 exercise classes. Recheck 1 week. Call with any concerns.

## 2023-10-14 NOTE — Progress Notes (Signed)
BP 112/66   Pulse 60   Temp 98.7 F (37.1 C) (Oral)   LMP 10/01/2023 (Approximate)   SpO2 100%    Subjective:    Patient ID: Sharon Gibson, female    DOB: 1981/08/26, 43 y.o.   MRN: [id number]  HPI: Sharon Gibson is a 43 y.o. female  Chief Complaint  Patient presents with   Depression   Psychiatrist at [facility] took her off rexaulti and is planning on starting her on lithium. THey want to extend her intensive outpatient program until March. Work has been really hard. She is being recruited for another job. She has been having a lot of social stressors at home. She is still not doing well in terms of her mood. She has missed a bunch of her exercise things. She is otherwise doing well with no other concerns or complaints at this time.   Relevant past medical, surgical, family and social history reviewed and updated as indicated. Interim medical history since our last visit reviewed. Allergies and medications reviewed and updated.  Review of Systems  Constitutional: Negative.   Respiratory: Negative.    Cardiovascular: Negative.   Musculoskeletal: Negative.   Psychiatric/Behavioral:  Positive for dysphoric mood. Negative for agitation, behavioral problems, confusion, decreased concentration, hallucinations, self-injury, sleep disturbance and suicidal ideas. The patient is nervous/anxious. The patient is not hyperactive.     Per HPI unless specifically indicated above     Objective:    BP 112/66   Pulse 60   Temp 98.7 F (37.1 C) (Oral)   LMP 10/01/2023 (Approximate)   SpO2 100%   Wt Readings from Last 3 Encounters:  09/08/23 279 lb (126.6 kg)  08/23/23 271 lb (122.9 kg)  08/18/23 271 lb (122.9 kg)    Physical Exam Vitals and nursing note reviewed.  Constitutional:      General: She is not in acute distress.    Appearance: Normal appearance. She is well-developed.  HENT:     Head: Normocephalic and atraumatic.     Right Ear: Hearing and external ear  normal.     Left Ear: Hearing and external ear normal.     Nose: Nose normal.     Mouth/Throat:     Mouth: Mucous membranes are moist.     Pharynx: Oropharynx is clear.  Eyes:     General: Lids are normal. No scleral icterus.       Right eye: No discharge.        Left eye: No discharge.     Conjunctiva/sclera: Conjunctivae normal.  Pulmonary:     Effort: Pulmonary effort is normal. No respiratory distress.  Musculoskeletal:        General: Normal range of motion.  Skin:    Coloration: Skin is not jaundiced or pale.     Findings: No bruising, erythema, lesion or rash.  Neurological:     Mental Status: She is alert. Mental status is at baseline. She is disoriented.  Psychiatric:        Mood and Affect: Mood normal.        Speech: Speech normal.        Behavior: Behavior normal.        Thought Content: Thought content normal.        Judgment: Judgment normal.     Results for orders placed or performed in visit on 10/05/23  Pregnancy, urine   Collection Time: 10/05/23  3:43 PM  Result Value Ref Range   Preg Test, Ur Negative Negative  Assessment & Plan:   Problem List Items Addressed This Visit       Other   Severe episode of recurrent major depressive disorder, without psychotic features (HCC) - Primary   In exacerbation. Still in intensive outpatient program. Continue to follow with them as meds are adjusted. Will try small goals this week- applying for new job and going to 2 exercise classes. Recheck 1 week. Call with any concerns.         Follow up plan: Return in about 1 week (around 10/21/2023).

## 2023-10-21 ENCOUNTER — Ambulatory Visit: Payer: 59 | Admitting: Family Medicine

## 2023-10-21 ENCOUNTER — Encounter: Payer: Self-pay | Admitting: Family Medicine

## 2023-10-21 VITALS — BP 105/66 | HR 61 | Temp 98.3°F

## 2023-10-21 DIAGNOSIS — F332 Major depressive disorder, recurrent severe without psychotic features: Secondary | ICD-10-CM | POA: Diagnosis not present

## 2023-10-21 NOTE — Progress Notes (Signed)
BP 105/66   Pulse 61   Temp 98.3 F (36.8 C) (Oral)   LMP 10/01/2023 (Approximate)   SpO2 97%    Subjective:    Patient ID: Sharon Gibson, female    DOB: 1981-01-13, 43 y.o.   MRN: [id number]  HPI: Sharon Gibson is a 43 y.o. female  Chief Complaint  Patient presents with   Depression   DEPRESSION Mood status: worse Satisfied with current treatment?: no Symptom severity: severe  Duration of current treatment : chronic Side effects: no Medication compliance: excellent compliance Psychotherapy/counseling: yes current Depressed mood: yes Anxious mood: yes Anhedonia: yes Significant weight loss or gain: no Insomnia: yes  Fatigue: yes Feelings of worthlessness or guilt: yes Impaired concentration/indecisiveness: yes Suicidal ideations: yes Hopelessness: yes Crying spells: yes    10/21/2023    3:57 PM 10/14/2023    4:12 PM 09/24/2023    1:50 PM 09/20/2023    1:21 PM 09/14/2023    3:25 PM  Depression screen PHQ 2/9  Decreased Interest 3 3 2  0 3  Down, Depressed, Hopeless 3 3 3  0 3  PHQ - 2 Score 6 6 5  0 6  Altered sleeping 3 3 3  3   Tired, decreased energy 3 3 3  3   Change in appetite 3 3 3  3   Feeling bad or failure about yourself  3 3 3  3   Trouble concentrating 3 3 3  3   Moving slowly or fidgety/restless 3 3 2  3   Suicidal thoughts 3 3 3  3   PHQ-9 Score 27 27 25  27   Difficult doing work/chores Extremely dIfficult Extremely dIfficult   Extremely dIfficult      10/21/2023    3:57 PM 10/14/2023    4:12 PM 09/24/2023    1:50 PM 09/14/2023    3:25 PM  GAD 7 : Generalized Anxiety Score  Nervous, Anxious, on Edge 3 3 3 3   Control/stop worrying 3 3 3 3   Worry too much - different things 3 3 2 3   Trouble relaxing 3 3 2 3   Restless 3 3 2 3   Easily annoyed or irritable 2 3 0 1  Afraid - awful might happen 3 3 3 3   Total GAD 7 Score 20 21 15 19   Anxiety Difficulty Extremely difficult Extremely difficult  Extremely difficult     Relevant past  medical, surgical, family and social history reviewed and updated as indicated. Interim medical history since our last visit reviewed. Allergies and medications reviewed and updated.  Review of Systems  Constitutional: Negative.   Respiratory: Negative.    Cardiovascular: Negative.   Musculoskeletal: Negative.   Psychiatric/Behavioral:  Positive for dysphoric mood and suicidal ideas. Negative for agitation, behavioral problems, confusion, decreased concentration, hallucinations, self-injury and sleep disturbance. The patient is nervous/anxious. The patient is not hyperactive.     Per HPI unless specifically indicated above     Objective:    BP 105/66   Pulse 61   Temp 98.3 F (36.8 C) (Oral)   LMP 10/01/2023 (Approximate)   SpO2 97%   Wt Readings from Last 3 Encounters:  09/08/23 279 lb (126.6 kg)  08/23/23 271 lb (122.9 kg)  08/18/23 271 lb (122.9 kg)    Physical Exam Vitals and nursing note reviewed.  Constitutional:      General: She is not in acute distress.    Appearance: Normal appearance. She is not ill-appearing, toxic-appearing or diaphoretic.  HENT:     Head: Normocephalic and atraumatic.  Right Ear: External ear normal.     Left Ear: External ear normal.     Nose: Nose normal.     Mouth/Throat:     Mouth: Mucous membranes are moist.     Pharynx: Oropharynx is clear.  Eyes:     General: No scleral icterus.       Right eye: No discharge.        Left eye: No discharge.     Extraocular Movements: Extraocular movements intact.     Conjunctiva/sclera: Conjunctivae normal.     Pupils: Pupils are equal, round, and reactive to light.  Cardiovascular:     Rate and Rhythm: Normal rate and regular rhythm.     Pulses: Normal pulses.     Heart sounds: Normal heart sounds. No murmur heard.    No friction rub. No gallop.  Pulmonary:     Effort: Pulmonary effort is normal. No respiratory distress.     Breath sounds: Normal breath sounds. No stridor. No wheezing,  rhonchi or rales.  Chest:     Chest wall: No tenderness.  Musculoskeletal:        General: Normal range of motion.     Cervical back: Normal range of motion and neck supple.  Skin:    General: Skin is warm and dry.     Capillary Refill: Capillary refill takes less than 2 seconds.     Coloration: Skin is not jaundiced or pale.     Findings: No bruising, erythema, lesion or rash.  Neurological:     General: No focal deficit present.     Mental Status: She is alert and oriented to person, place, and time. Mental status is at baseline.  Psychiatric:        Mood and Affect: Mood normal.        Behavior: Behavior normal.        Thought Content: Thought content normal.        Judgment: Judgment normal.     Results for orders placed or performed in visit on 10/05/23  Pregnancy, urine   Collection Time: 10/05/23  3:43 PM  Result Value Ref Range   Preg Test, Ur Negative Negative      Assessment & Plan:   Problem List Items Addressed This Visit       Other   Severe episode of recurrent major depressive disorder, without psychotic features (HCC) - Primary   Not doing particularly well. Continue IOP. Warning signs for higher level of care discussed- will continue to monitor closely. Due to see psychiatry on Tuesday. Call with any concerns.         Follow up plan: Return in about 1 week (around 10/28/2023).

## 2023-10-24 ENCOUNTER — Encounter: Payer: Self-pay | Admitting: Family Medicine

## 2023-10-24 MED ORDER — TIZANIDINE HCL 4 MG PO TABS: ORAL_TABLET | ORAL | 1 refills | Status: DC

## 2023-10-24 MED ORDER — GABAPENTIN 300 MG PO CAPS: 600.0000 mg | ORAL_CAPSULE | Freq: Every day | ORAL | 3 refills | Status: DC

## 2023-10-24 NOTE — Assessment & Plan Note (Signed)
Not doing particularly well. Continue IOP. Warning signs for higher level of care discussed- will continue to monitor closely. Due to see psychiatry on Tuesday. Call with any concerns.

## 2023-10-25 ENCOUNTER — Encounter: Payer: Self-pay | Admitting: Family Medicine

## 2023-10-28 ENCOUNTER — Other Ambulatory Visit: Payer: Self-pay | Admitting: Family Medicine

## 2023-10-29 NOTE — Telephone Encounter (Signed)
Requested medication (s) are due for refill today - no  Requested medication (s) are on the active medication list -yes  Future visit scheduled -yes  Last refill: 10/24/23 #30 1RF  Notes to clinic: non delegated Rx  Requested Prescriptions  Pending Prescriptions Disp Refills   tiZANidine (ZANAFLEX) 4 MG tablet [Pharmacy Med Name: TIZANIDINE HCL 4 MG TABLET] 90 tablet 1    Sig: TAKE 1 TABLET BY MOUTH AT BEDTIME AS NEEDED FOR MUSCLE SPASMS.     Not Delegated - Cardiovascular:  Alpha-2 Agonists - tizanidine Failed - 10/29/2023  8:48 AM      Failed - This refill cannot be delegated      Passed - Valid encounter within last 6 months    Recent Outpatient Visits           1 week ago Severe episode of recurrent major depressive disorder, without psychotic features (HCC)   [place] [facility] [place], Megan P, DO   2 weeks ago Severe episode of recurrent major depressive disorder, without psychotic features [facility])   [facility] [place], Megan P, DO   3 weeks ago Nexplanon insertion   [place] [facility] [place], Atilano Median, MD   1 month ago Severe episode of recurrent major depressive disorder, without psychotic features (HCC)   [facility] [place], Megan P, DO   1 month ago Severe episode of recurrent major depressive disorder, without psychotic features (HCC)   [place] [facility] [place], Oralia Rud, DO       Future Appointments             In 3 days Laural Benes, Oralia Rud, DO [place]               Requested Prescriptions  Pending Prescriptions Disp Refills   tiZANidine (ZANAFLEX) 4 MG tablet [Pharmacy Med Name: TIZANIDINE HCL 4 MG TABLET] 90 tablet 1    Sig: TAKE 1 TABLET BY MOUTH AT BEDTIME AS NEEDED FOR MUSCLE SPASMS.     Not Delegated - Cardiovascular:  Alpha-2 Agonists - tizanidine Failed - 10/29/2023  8:48 AM      Failed - This refill cannot be  delegated      Passed - Valid encounter within last 6 months    Recent Outpatient Visits           1 week ago Severe episode of recurrent major depressive disorder, without psychotic features (HCC)   [place] [facility] [place], Megan P, DO   2 weeks ago Severe episode of recurrent major depressive disorder, without psychotic features [facility])   [place] [facility] [place], Megan P, DO   3 weeks ago Nexplanon insertion   [place] [facility] [place], Atilano Median, MD   1 month ago Severe episode of recurrent major depressive disorder, without psychotic features (HCC)   [place] [facility] [place], Megan P, DO   1 month ago Severe episode of recurrent major depressive disorder, without psychotic features [facility])   [place] [facility] Dorcas Carrow, DO       Future Appointments             In 3 days Laural Benes, Oralia Rud, DO [place] [facility], [facility]

## 2023-11-01 ENCOUNTER — Encounter: Payer: Self-pay | Admitting: Family Medicine

## 2023-11-01 ENCOUNTER — Ambulatory Visit: Payer: 59 | Admitting: Family Medicine

## 2023-11-01 VITALS — BP 103/63 | HR 60

## 2023-11-01 DIAGNOSIS — R519 Headache, unspecified: Secondary | ICD-10-CM

## 2023-11-01 DIAGNOSIS — F332 Major depressive disorder, recurrent severe without psychotic features: Secondary | ICD-10-CM | POA: Diagnosis not present

## 2023-11-01 MED ORDER — KETOROLAC TROMETHAMINE 60 MG/2ML IM SOLN: 60.0000 mg | Freq: Once | INTRAMUSCULAR | Status: DC

## 2023-11-01 MED ORDER — TRIAMCINOLONE ACETONIDE 0.1 % EX CREA: 1.0000 | TOPICAL_CREAM | Freq: Two times a day (BID) | CUTANEOUS | 0 refills | Status: AC

## 2023-11-01 NOTE — Progress Notes (Unsigned)
BP 103/63   Pulse 60   LMP 10/01/2023 (Approximate)   SpO2 98%    Subjective:    Patient ID: Sharon Gibson, female    DOB: 1981-08-01, 43 y.o.   MRN: [id number]  HPI: Sharon Gibson is a 43 y.o. female  Chief Complaint  Patient presents with  . Depression    Patient says she was prescribed Lithium Carbonate medication and wants to discuss with provider as she is terrified to take it.  Marland Kitchen Headache    Patient says she has noticed more headaches since the insertion of her Nexplanon. Patient says the past three days have been the worse and says yesterday since had to cover her head with a pillow due to the light sensitivity. Patient declines taking any medications.     Relevant past medical, surgical, family and social history reviewed and updated as indicated. Interim medical history since our last visit reviewed. Allergies and medications reviewed and updated.  Review of Systems  Per HPI unless specifically indicated above     Objective:    BP 103/63   Pulse 60   LMP 10/01/2023 (Approximate)   SpO2 98%   Wt Readings from Last 3 Encounters:  09/08/23 279 lb (126.6 kg)  08/23/23 271 lb (122.9 kg)  08/18/23 271 lb (122.9 kg)    Physical Exam Vitals and nursing note reviewed.  Constitutional:      General: She is not in acute distress.    Appearance: Normal appearance. She is not ill-appearing, toxic-appearing or diaphoretic.  HENT:     Head: Normocephalic and atraumatic.     Right Ear: External ear normal.     Left Ear: External ear normal.     Nose: Nose normal.     Mouth/Throat:     Mouth: Mucous membranes are moist.     Pharynx: Oropharynx is clear.  Eyes:     General: No scleral icterus.       Right eye: No discharge.        Left eye: No discharge.     Extraocular Movements: Extraocular movements intact.     Conjunctiva/sclera: Conjunctivae normal.     Pupils: Pupils are equal, round, and reactive to light.  Cardiovascular:     Rate and Rhythm:  Normal rate and regular rhythm.     Pulses: Normal pulses.     Heart sounds: Normal heart sounds. No murmur heard.    No friction rub. No gallop.  Pulmonary:     Effort: Pulmonary effort is normal. No respiratory distress.     Breath sounds: Normal breath sounds. No stridor. No wheezing, rhonchi or rales.  Chest:     Chest wall: No tenderness.  Musculoskeletal:        General: Normal range of motion.     Cervical back: Normal range of motion and neck supple.  Skin:    General: Skin is warm and dry.     Capillary Refill: Capillary refill takes less than 2 seconds.     Coloration: Skin is not jaundiced or pale.     Findings: No bruising, erythema, lesion or rash.  Neurological:     General: No focal deficit present.     Mental Status: She is alert and oriented to person, place, and time. Mental status is at baseline.  Psychiatric:        Mood and Affect: Mood normal.        Behavior: Behavior normal.        Thought Content: Thought content  normal.        Judgment: Judgment normal.    Results for orders placed or performed in visit on 10/05/23  Pregnancy, urine   Collection Time: 10/05/23  3:43 PM  Result Value Ref Range   Preg Test, Ur Negative Negative      Assessment & Plan:   Problem List Items Addressed This Visit   None Visit Diagnoses       Acute nonintractable headache, unspecified headache type    -  Primary   Relevant Medications   ketorolac (TORADOL) injection 60 mg (Start on 11/01/2023  4:30 PM)        Follow up plan: No follow-ups on file.

## 2023-11-05 ENCOUNTER — Encounter: Payer: Self-pay | Admitting: Family Medicine

## 2023-11-05 ENCOUNTER — Ambulatory Visit: Payer: 59 | Admitting: Family Medicine

## 2023-11-05 VITALS — BP 125/74 | HR 61

## 2023-11-05 DIAGNOSIS — E538 Deficiency of other specified B group vitamins: Secondary | ICD-10-CM | POA: Diagnosis not present

## 2023-11-05 DIAGNOSIS — E039 Hypothyroidism, unspecified: Secondary | ICD-10-CM | POA: Diagnosis not present

## 2023-11-05 DIAGNOSIS — R519 Headache, unspecified: Secondary | ICD-10-CM | POA: Diagnosis not present

## 2023-11-05 DIAGNOSIS — F332 Major depressive disorder, recurrent severe without psychotic features: Secondary | ICD-10-CM | POA: Diagnosis not present

## 2023-11-05 DIAGNOSIS — E559 Vitamin D deficiency, unspecified: Secondary | ICD-10-CM

## 2023-11-05 MED ORDER — KETOROLAC TROMETHAMINE 60 MG/2ML IM SOLN
60.0000 mg | Freq: Once | INTRAMUSCULAR | Status: AC
  Administered 2023-11-05: 60 mg via INTRAMUSCULAR

## 2023-11-05 MED ORDER — CLONAZEPAM 0.5 MG PO TABS: 0.5000 mg | ORAL_TABLET | Freq: Every day | ORAL | 0 refills | Status: DC | PRN

## 2023-11-05 NOTE — Assessment & Plan Note (Signed)
 Started her lithium . Feeling well, but still anxious about it. Will check labs although we let her know it's very early to check. Continue to monitor closely. Recheck 1 week.

## 2023-11-05 NOTE — Progress Notes (Signed)
 BP 125/74   Pulse 61   SpO2 99%    Subjective:    Patient ID: Sharon Gibson, female    DOB: 1981-01-05, 43 y.o.   MRN: [id number]  HPI: Sharon Gibson is a 43 y.o. female  Chief Complaint  Patient presents with   Depression   Started the lithium . No side effects so far. Started it on Monday. She notes that it's making her a little tired at night, but she's OK with that. Had some GI issues the first day- but that has resolved. She feels like her mood is about the same. No changes. She was able to get out to an exercise class this week which she hasn't been able to do for the past couple of weeks.   Didn't get her toradol  shot on Monday. Has continued with headache.   Relevant past medical, surgical, family and social history reviewed and updated as indicated. Interim medical history since our last visit reviewed. Allergies and medications reviewed and updated.  Review of Systems  Constitutional: Negative.   Respiratory: Negative.    Cardiovascular: Negative.   Musculoskeletal: Negative.   Neurological:  Positive for headaches. Negative for dizziness, tremors, seizures, syncope, facial asymmetry, speech difficulty, weakness, light-headedness and numbness.  Psychiatric/Behavioral:  Positive for dysphoric mood. Negative for agitation, behavioral problems, confusion, decreased concentration, hallucinations, self-injury, sleep disturbance and suicidal ideas. The patient is nervous/anxious. The patient is not hyperactive.     Per HPI unless specifically indicated above     Objective:    BP 125/74   Pulse 61   SpO2 99%   Wt Readings from Last 3 Encounters:  09/08/23 279 lb (126.6 kg)  08/23/23 271 lb (122.9 kg)  08/18/23 271 lb (122.9 kg)    Physical Exam Vitals and nursing note reviewed.  Constitutional:      General: She is not in acute distress.    Appearance: Normal appearance. She is not ill-appearing, toxic-appearing or diaphoretic.  HENT:     Head:  Normocephalic and atraumatic.     Right Ear: External ear normal.     Left Ear: External ear normal.     Nose: Nose normal.     Mouth/Throat:     Mouth: Mucous membranes are moist.     Pharynx: Oropharynx is clear.  Eyes:     General: No scleral icterus.       Right eye: No discharge.        Left eye: No discharge.     Extraocular Movements: Extraocular movements intact.     Conjunctiva/sclera: Conjunctivae normal.     Pupils: Pupils are equal, round, and reactive to light.  Cardiovascular:     Rate and Rhythm: Normal rate and regular rhythm.     Pulses: Normal pulses.     Heart sounds: Normal heart sounds. No murmur heard.    No friction rub. No gallop.  Pulmonary:     Effort: Pulmonary effort is normal. No respiratory distress.     Breath sounds: Normal breath sounds. No stridor. No wheezing, rhonchi or rales.  Chest:     Chest wall: No tenderness.  Musculoskeletal:        General: Normal range of motion.     Cervical back: Normal range of motion and neck supple.  Skin:    General: Skin is warm and dry.     Capillary Refill: Capillary refill takes less than 2 seconds.     Coloration: Skin is not jaundiced or pale.  Findings: No bruising, erythema, lesion or rash.  Neurological:     General: No focal deficit present.     Mental Status: She is alert and oriented to person, place, and time. Mental status is at baseline.  Psychiatric:        Mood and Affect: Mood normal.        Behavior: Behavior normal.        Thought Content: Thought content normal.        Judgment: Judgment normal.     Results for orders placed or performed in visit on 10/05/23  Pregnancy, urine   Collection Time: 10/05/23  3:43 PM  Result Value Ref Range   Preg Test, Ur Negative Negative      Assessment & Plan:   Problem List Items Addressed This Visit       Endocrine   Hypothyroidism   Rechecking labs today. Await results. Treat as needed.       Relevant Orders   Thyroid  Panel With  TSH     Other   B12 deficiency (Chronic)   Rechecking labs today. Await results. Treat as needed.       Relevant Orders   B12   Severe episode of recurrent major depressive disorder, without psychotic features (HCC) - Primary   Started her lithium . Feeling well, but still anxious about it. Will check labs although we let her know it's very early to check. Continue to monitor closely. Recheck 1 week.       Relevant Orders   Basic metabolic panel   Lithium  level   Vitamin D  deficiency   Rechecking labs today. Await results. Treat as needed.       Relevant Orders   VITAMIN D  25 Hydroxy (Vit-D Deficiency, Fractures)   Other Visit Diagnoses       Acute nonintractable headache, unspecified headache type       Didn't get her toradol  shot on Monday. Will give today. Call with any concerns or if not getting better.   Relevant Medications   ketorolac  (TORADOL ) injection 60 mg (Completed)   clonazePAM  (KLONOPIN ) 0.5 MG tablet        Follow up plan: Return in about 1 week (around 11/12/2023).

## 2023-11-05 NOTE — Assessment & Plan Note (Signed)
 Rechecking labs today. Await results. Treat as needed.

## 2023-11-05 NOTE — Assessment & Plan Note (Signed)
 Not doing well. Anxiety has been through the roof. Has not started her lithium  yet as she's very anxious about it. Had to stop [facility]. Interested in seeing another psychiatrist. Will start lithium  today and recheck at the end of the week for close follow up. Continue to monitor.

## 2023-11-08 ENCOUNTER — Encounter: Payer: Self-pay | Admitting: Obstetrics

## 2023-11-15 ENCOUNTER — Ambulatory Visit: Payer: 59 | Admitting: Family Medicine

## 2023-11-15 ENCOUNTER — Encounter: Payer: Self-pay | Admitting: Family Medicine

## 2023-11-15 VITALS — BP 94/65 | HR 67 | Temp 99.1°F | Ht 63.0 in

## 2023-11-15 DIAGNOSIS — F332 Major depressive disorder, recurrent severe without psychotic features: Secondary | ICD-10-CM

## 2023-11-15 NOTE — Progress Notes (Signed)
 BP 94/65 (BP Location: Right Arm, Patient Position: Sitting, Cuff Size: Normal)   Pulse 67   Temp 99.1 F (37.3 C) (Oral)   Ht 5\' 3"  (1.6 m)   LMP 11/08/2023 (Approximate)   SpO2 97%   BMI 49.42 kg/m    Subjective:    Patient ID: Sharon Gibson, female    DOB: [birth year], 43 y.o.   MRN: [id number]  HPI: Sharon Gibson is a 43 y.o. female  Chief Complaint  Patient presents with   Depression   Has been feeling a bit blunted, but is OK with that right now. Last day at [facility] is 2/28 and starts new job 3/3.   DEPRESSION Mood status: uncontrolled Satisfied with current treatment?: yes Symptom severity: severe  Duration of current treatment :  weeks Side effects: no Medication compliance: excellent compliance Psychotherapy/counseling: yes current Depressed mood: yes Anxious mood: yes Anhedonia: yes Significant weight loss or gain: no Insomnia: no  Fatigue: yes Feelings of worthlessness or guilt: yes Impaired concentration/indecisiveness: yes Suicidal ideations: yes Hopelessness: yes Crying spells: no    11/05/2023    8:36 AM 11/01/2023    3:50 PM 10/21/2023    3:57 PM 10/14/2023    4:12 PM 09/24/2023    1:50 PM  Depression screen PHQ 2/9  Decreased Interest 3 3 3 3 2   Down, Depressed, Hopeless 3 3 3 3 3   PHQ - 2 Score 6 6 6 6 5   Altered sleeping 3 3 3 3 3   Tired, decreased energy 3 3 3 3 3   Change in appetite 3 3 3 3 3   Feeling bad or failure about yourself  3 3 3 3 3   Trouble concentrating 3 3 3 3 3   Moving slowly or fidgety/restless 3 3 3 3 2   Suicidal thoughts 3 3 3 3 3   PHQ-9 Score 27 27 27 27 25   Difficult doing work/chores Extremely dIfficult Extremely dIfficult Extremely dIfficult Extremely dIfficult      Relevant past medical, surgical, family and social history reviewed and updated as indicated. Interim medical history since our last visit reviewed. Allergies and medications reviewed and updated.  Review of Systems  Constitutional: Negative.    Respiratory: Negative.    Cardiovascular: Negative.   Musculoskeletal: Negative.   Neurological: Negative.   Psychiatric/Behavioral:  Positive for dysphoric mood and suicidal ideas. Negative for agitation, behavioral problems, confusion, decreased concentration, hallucinations, self-injury and sleep disturbance. The patient is nervous/anxious. The patient is not hyperactive.     Per HPI unless specifically indicated above     Objective:    BP 94/65 (BP Location: Right Arm, Patient Position: Sitting, Cuff Size: Normal)   Pulse 67   Temp 99.1 F (37.3 C) (Oral)   Ht 5\' 3"  (1.6 m)   LMP 11/08/2023 (Approximate)   SpO2 97%   BMI 49.42 kg/m   Wt Readings from Last 3 Encounters:  09/08/23 279 lb (126.6 kg)  08/23/23 271 lb (122.9 kg)  08/18/23 271 lb (122.9 kg)    Physical Exam Vitals and nursing note reviewed.  Constitutional:      General: She is not in acute distress.    Appearance: Normal appearance. She is not ill-appearing, toxic-appearing or diaphoretic.  HENT:     Head: Normocephalic and atraumatic.     Right Ear: External ear normal.     Left Ear: External ear normal.     Nose: Nose normal.     Mouth/Throat:     Mouth: Mucous membranes are moist.  Pharynx: Oropharynx is clear.  Eyes:     General: No scleral icterus.       Right eye: No discharge.        Left eye: No discharge.     Extraocular Movements: Extraocular movements intact.     Conjunctiva/sclera: Conjunctivae normal.     Pupils: Pupils are equal, round, and reactive to light.  Cardiovascular:     Rate and Rhythm: Normal rate and regular rhythm.     Pulses: Normal pulses.     Heart sounds: Normal heart sounds. No murmur heard.    No friction rub. No gallop.  Pulmonary:     Effort: Pulmonary effort is normal. No respiratory distress.     Breath sounds: Normal breath sounds. No stridor. No wheezing, rhonchi or rales.  Chest:     Chest wall: No tenderness.  Musculoskeletal:        General: Normal  range of motion.     Cervical back: Normal range of motion and neck supple.  Skin:    General: Skin is warm and dry.     Capillary Refill: Capillary refill takes less than 2 seconds.     Coloration: Skin is not jaundiced or pale.     Findings: No bruising, erythema, lesion or rash.  Neurological:     General: No focal deficit present.     Mental Status: She is alert and oriented to person, place, and time. Mental status is at baseline.  Psychiatric:        Mood and Affect: Mood normal.        Behavior: Behavior normal.        Thought Content: Thought content normal.        Judgment: Judgment normal.     Results for orders placed or performed in visit on 10/05/23  Pregnancy, urine   Collection Time: 10/05/23  3:43 PM  Result Value Ref Range   Preg Test, Ur Negative Negative      Assessment & Plan:   Problem List Items Addressed This Visit       Other   Severe episode of recurrent major depressive disorder, without psychotic features (HCC) - Primary   Tolerating her lithium well. Has not gotten her blood work done yet. Scheduled to see psychiatry in several weeks. Continue to monitor. Call with any concerns.         Follow up plan: Return in about 9 days (around 11/24/2023) for OK to double book if need be.

## 2023-11-21 NOTE — Assessment & Plan Note (Signed)
 Tolerating her lithium well. Has not gotten her blood work done yet. Scheduled to see psychiatry in several weeks. Continue to monitor. Call with any concerns.

## 2023-11-24 ENCOUNTER — Encounter: Payer: Self-pay | Admitting: Family Medicine

## 2023-11-24 ENCOUNTER — Ambulatory Visit: Payer: 59 | Admitting: Family Medicine

## 2023-11-24 VITALS — BP 99/65 | HR 71 | Temp 98.4°F | Resp 16

## 2023-11-24 DIAGNOSIS — F332 Major depressive disorder, recurrent severe without psychotic features: Secondary | ICD-10-CM

## 2023-11-24 DIAGNOSIS — E538 Deficiency of other specified B group vitamins: Secondary | ICD-10-CM

## 2023-11-24 DIAGNOSIS — E039 Hypothyroidism, unspecified: Secondary | ICD-10-CM | POA: Diagnosis not present

## 2023-11-24 DIAGNOSIS — E559 Vitamin D deficiency, unspecified: Secondary | ICD-10-CM

## 2023-11-24 DIAGNOSIS — F4310 Post-traumatic stress disorder, unspecified: Secondary | ICD-10-CM

## 2023-11-24 MED ORDER — VITAMIN D (ERGOCALCIFEROL) 1.25 MG (50000 UNIT) PO CAPS: 50000.0000 [IU] | ORAL_CAPSULE | ORAL | 1 refills | Status: DC

## 2023-11-24 MED ORDER — CLONAZEPAM 0.5 MG PO TABS: 0.5000 mg | ORAL_TABLET | Freq: Every day | ORAL | 0 refills | Status: DC | PRN

## 2023-11-24 MED ORDER — SERTRALINE HCL 100 MG PO TABS: 250.0000 mg | ORAL_TABLET | ORAL | 1 refills | Status: DC

## 2023-11-24 MED ORDER — TIZANIDINE HCL 4 MG PO TABS: ORAL_TABLET | ORAL | 1 refills | Status: DC

## 2023-11-24 MED ORDER — GABAPENTIN 300 MG PO CAPS: 600.0000 mg | ORAL_CAPSULE | Freq: Every day | ORAL | 1 refills | Status: DC

## 2023-11-24 MED ORDER — LITHIUM CARBONATE 150 MG PO CAPS: 150.0000 mg | ORAL_CAPSULE | Freq: Every day | ORAL | 1 refills | Status: DC

## 2023-11-24 NOTE — Progress Notes (Signed)
 BP 99/65 (BP Location: Right Wrist, Patient Position: Sitting, Cuff Size: Normal)   Pulse 71   Temp 98.4 F (36.9 C) (Oral)   Resp 16   LMP 11/08/2023 (Approximate)   SpO2 97%    Subjective:    Patient ID: Sharon Gibson, female    DOB: [birth year], 43 y.o.   MRN: [id number]  HPI: Sharon Gibson is a 43 y.o. female  Chief Complaint  Patient presents with   Depression    Doing ok   DEPRESSION Duration: chronic Mood status: very slightly better Satisfied with current treatment?: yes Symptom severity: severe  Duration of current treatment : about a month Side effects: no Medication compliance: excellent compliance Psychotherapy/counseling: yes current Depressed mood: yes Anxious mood: yes Anhedonia: yes Significant weight loss or gain: no Insomnia: no  Fatigue: yes Feelings of worthlessness or guilt: no Impaired concentration/indecisiveness: yes Suicidal ideations: yes Hopelessness: no Crying spells: no    11/24/2023   10:35 AM 11/05/2023    8:36 AM 11/01/2023    3:50 PM 10/21/2023    3:57 PM 10/14/2023    4:12 PM  Depression screen PHQ 2/9  Decreased Interest 2 3 3 3 3   Down, Depressed, Hopeless 2 3 3 3 3   PHQ - 2 Score 4 6 6 6 6   Altered sleeping 3 3 3 3 3   Tired, decreased energy 3 3 3 3 3   Change in appetite 3 3 3 3 3   Feeling bad or failure about yourself  2 3 3 3 3   Trouble concentrating 2 3 3 3 3   Moving slowly or fidgety/restless 3 3 3 3 3   Suicidal thoughts 2 3 3 3 3   PHQ-9 Score 22 27 27 27 27   Difficult doing work/chores Very difficult Extremely dIfficult Extremely dIfficult Extremely dIfficult Extremely dIfficult    Relevant past medical, surgical, family and social history reviewed and updated as indicated. Interim medical history since our last visit reviewed. Allergies and medications reviewed and updated.  Review of Systems  Constitutional: Negative.   Respiratory: Negative.    Cardiovascular: Negative.   Gastrointestinal: Negative.    Musculoskeletal: Negative.   Skin: Negative.   Psychiatric/Behavioral:  Positive for dysphoric mood. Negative for agitation, behavioral problems, confusion, decreased concentration, hallucinations, self-injury, sleep disturbance and suicidal ideas. The patient is nervous/anxious. The patient is not hyperactive.     Per HPI unless specifically indicated above     Objective:    BP 99/65 (BP Location: Right Wrist, Patient Position: Sitting, Cuff Size: Normal)   Pulse 71   Temp 98.4 F (36.9 C) (Oral)   Resp 16   LMP 11/08/2023 (Approximate)   SpO2 97%   Wt Readings from Last 3 Encounters:  09/08/23 279 lb (126.6 kg)  08/23/23 271 lb (122.9 kg)  08/18/23 271 lb (122.9 kg)    Physical Exam Vitals and nursing note reviewed.  Constitutional:      General: She is not in acute distress.    Appearance: Normal appearance. She is not ill-appearing, toxic-appearing or diaphoretic.  HENT:     Head: Normocephalic and atraumatic.     Right Ear: External ear normal.     Left Ear: External ear normal.     Nose: Nose normal.     Mouth/Throat:     Mouth: Mucous membranes are moist.     Pharynx: Oropharynx is clear.  Eyes:     General: No scleral icterus.       Right eye: No discharge.  Left eye: No discharge.     Extraocular Movements: Extraocular movements intact.     Conjunctiva/sclera: Conjunctivae normal.     Pupils: Pupils are equal, round, and reactive to light.  Cardiovascular:     Rate and Rhythm: Normal rate and regular rhythm.     Pulses: Normal pulses.     Heart sounds: Normal heart sounds. No murmur heard.    No friction rub. No gallop.  Pulmonary:     Effort: Pulmonary effort is normal. No respiratory distress.     Breath sounds: Normal breath sounds. No stridor. No wheezing, rhonchi or rales.  Chest:     Chest wall: No tenderness.  Musculoskeletal:        General: Normal range of motion.     Cervical back: Normal range of motion and neck supple.  Skin:     General: Skin is warm and dry.     Capillary Refill: Capillary refill takes less than 2 seconds.     Coloration: Skin is not jaundiced or pale.     Findings: No bruising, erythema, lesion or rash.  Neurological:     General: No focal deficit present.     Mental Status: She is alert and oriented to person, place, and time. Mental status is at baseline.  Psychiatric:        Mood and Affect: Mood normal.        Behavior: Behavior normal.        Thought Content: Thought content normal.        Judgment: Judgment normal.     Results for orders placed or performed in visit on 10/05/23  Pregnancy, urine   Collection Time: 10/05/23  3:43 PM  Result Value Ref Range   Preg Test, Ur Negative Negative      Assessment & Plan:   Problem List Items Addressed This Visit       Endocrine   Hypothyroidism     Other   B12 deficiency (Chronic)   PTSD (post-traumatic stress disorder)   Relevant Medications   sertraline (ZOLOFT) 100 MG tablet   Severe episode of recurrent major depressive disorder, without psychotic features (HCC) - Primary   Slightly improved. Call with any concerns. Continue to monitor. Checking labs today. Recheck in about 2 weeks.       Relevant Medications   sertraline (ZOLOFT) 100 MG tablet   Vitamin D deficiency     Follow up plan: Return in about 16 days (around 12/10/2023).

## 2023-11-24 NOTE — Assessment & Plan Note (Signed)
 Slightly improved. Call with any concerns. Continue to monitor. Checking labs today. Recheck in about 2 weeks.

## 2023-11-25 LAB — BASIC METABOLIC PANEL
BUN/Creatinine Ratio: 23 (ref 9–23)
BUN: 15 mg/dL (ref 6–24)
CO2: 17 mmol/L — ABNORMAL LOW (ref 20–29)
Calcium: 9 mg/dL (ref 8.7–10.2)
Chloride: 107 mmol/L — ABNORMAL HIGH (ref 96–106)
Creatinine, Ser: 0.64 mg/dL (ref 0.57–1.00)
Glucose: 80 mg/dL (ref 70–99)
Potassium: 4.7 mmol/L (ref 3.5–5.2)
Sodium: 140 mmol/L (ref 134–144)
eGFR: 112 mL/min/{1.73_m2} (ref >59)

## 2023-11-25 LAB — THYROID PANEL WITH TSH
Free Thyroxine Index: 1.3 (ref 1.2–4.9)
T3 Uptake Ratio: 24 % (ref 24–39)
T4, Total: 5.6 ug/dL (ref 4.5–12.0)
TSH: 5.44 u[IU]/mL — ABNORMAL HIGH (ref 0.450–4.500)

## 2023-11-25 LAB — VITAMIN B12: Vitamin B-12: 261 pg/mL (ref 232–1245)

## 2023-11-25 LAB — LITHIUM LEVEL: Lithium Lvl: 0.1 mmol/L — ABNORMAL LOW (ref 0.5–1.2)

## 2023-11-25 LAB — VITAMIN D 25 HYDROXY (VIT D DEFICIENCY, FRACTURES): Vit D, 25-Hydroxy: 11.8 ng/mL — ABNORMAL LOW (ref 30.0–100.0)

## 2023-11-29 ENCOUNTER — Encounter: Payer: Self-pay | Admitting: Family Medicine

## 2023-11-29 ENCOUNTER — Other Ambulatory Visit: Payer: Self-pay | Admitting: Family Medicine

## 2023-11-29 MED ORDER — LEVOTHYROXINE SODIUM 100 MCG PO TABS: 100.0000 ug | ORAL_TABLET | Freq: Every day | ORAL | 0 refills | Status: DC

## 2023-11-29 MED ORDER — VITAMIN D (ERGOCALCIFEROL) 1.25 MG (50000 UNIT) PO CAPS: 50000.0000 [IU] | ORAL_CAPSULE | ORAL | 1 refills | Status: DC

## 2023-12-08 ENCOUNTER — Other Ambulatory Visit: Payer: Self-pay | Admitting: Family Medicine

## 2023-12-10 ENCOUNTER — Ambulatory Visit: Payer: 59 | Admitting: Family Medicine

## 2023-12-10 ENCOUNTER — Encounter: Payer: Self-pay | Admitting: Family Medicine

## 2023-12-10 VITALS — BP 118/69 | HR 57 | Temp 98.3°F | Resp 17

## 2023-12-10 DIAGNOSIS — F332 Major depressive disorder, recurrent severe without psychotic features: Secondary | ICD-10-CM

## 2023-12-10 MED ORDER — CLONAZEPAM 1 MG PO TABS: 1.0000 mg | ORAL_TABLET | Freq: Every day | ORAL | 1 refills | Status: DC | PRN

## 2023-12-10 NOTE — Progress Notes (Signed)
 BP 118/69 (BP Location: Left Arm, Patient Position: Sitting, Cuff Size: Large)   Pulse (!) 57   Temp 98.3 F (36.8 C) (Oral)   Resp 17   LMP 11/08/2023 (Approximate)   SpO2 98%    Subjective:    Patient ID: Sharon Gibson, female    DOB: Oct 17, 1980, 43 y.o.   MRN: [id number]  HPI: Sharon Gibson is a 43 y.o. female  Chief Complaint  Patient presents with   Depression   ANXIETY/DEPRESSION Duration: chronic Status:better Anxious mood: yes  Excessive worrying: yes Irritability: no  Sweating: no Nausea: no Palpitations:no Hyperventilation: no Panic attacks: no Agoraphobia: no  Obscessions/compulsions: yes Depressed mood: yes    11/24/2023   10:35 AM 11/05/2023    8:36 AM 11/01/2023    3:50 PM 10/21/2023    3:57 PM 10/14/2023    4:12 PM  Depression screen PHQ 2/9  Decreased Interest 2 3 3 3 3   Down, Depressed, Hopeless 2 3 3 3 3   PHQ - 2 Score 4 6 6 6 6   Altered sleeping 3 3 3 3 3   Tired, decreased energy 3 3 3 3 3   Change in appetite 3 3 3 3 3   Feeling bad or failure about yourself  2 3 3 3 3   Trouble concentrating 2 3 3 3 3   Moving slowly or fidgety/restless 3 3 3 3 3   Suicidal thoughts 2 3 3 3 3   PHQ-9 Score 22 27 27 27 27   Difficult doing work/chores Very difficult Extremely dIfficult Extremely dIfficult Extremely dIfficult Extremely dIfficult      11/24/2023   10:35 AM 11/05/2023    8:36 AM 11/01/2023    3:50 PM 10/21/2023    3:57 PM  GAD 7 : Generalized Anxiety Score  Nervous, Anxious, on Edge 3 3 3 3   Control/stop worrying 3 3 3 3   Worry too much - different things 3 3 3 3   Trouble relaxing 3 3 3 3   Restless 3 3 3 3   Easily annoyed or irritable 3 3 3 2   Afraid - awful might happen 3 3 3 3   Total GAD 7 Score 21 21 21 20   Anxiety Difficulty Extremely difficult Extremely difficult Extremely difficult Extremely difficult   Anhedonia: yes Weight changes: no Insomnia: yes   Hypersomnia: no Fatigue/loss of energy: yes Feelings of worthlessness:  no Feelings of guilt: no Impaired concentration/indecisiveness: no Suicidal ideations: no  Crying spells: no Recent Stressors/Life Changes: yes   Relationship problems: no   Family stress: yes     Financial stress: yes    Job stress: yes    Recent death/loss: no   Relevant past medical, surgical, family and social history reviewed and updated as indicated. Interim medical history since our last visit reviewed. Allergies and medications reviewed and updated.  Review of Systems  Constitutional: Negative.   Respiratory: Negative.    Cardiovascular: Negative.   Musculoskeletal: Negative.   Skin: Negative.   Neurological: Negative.   Psychiatric/Behavioral:  Positive for dysphoric mood and sleep disturbance. Negative for agitation, behavioral problems, confusion, decreased concentration, hallucinations, self-injury and suicidal ideas. The patient is nervous/anxious. The patient is not hyperactive.     Per HPI unless specifically indicated above     Objective:    BP 118/69 (BP Location: Left Arm, Patient Position: Sitting, Cuff Size: Large)   Pulse (!) 57   Temp 98.3 F (36.8 C) (Oral)   Resp 17   LMP 11/08/2023 (Approximate)   SpO2 98%   Wt Readings from  Last 3 Encounters:  09/08/23 279 lb (126.6 kg)  08/23/23 271 lb (122.9 kg)  08/18/23 271 lb (122.9 kg)    Physical Exam Vitals and nursing note reviewed.  Constitutional:      General: She is not in acute distress.    Appearance: Normal appearance. She is not ill-appearing, toxic-appearing or diaphoretic.  HENT:     Head: Normocephalic and atraumatic.     Right Ear: External ear normal.     Left Ear: External ear normal.     Nose: Nose normal.     Mouth/Throat:     Mouth: Mucous membranes are moist.     Pharynx: Oropharynx is clear.  Eyes:     General: No scleral icterus.       Right eye: No discharge.        Left eye: No discharge.     Extraocular Movements: Extraocular movements intact.      Conjunctiva/sclera: Conjunctivae normal.     Pupils: Pupils are equal, round, and reactive to light.  Cardiovascular:     Rate and Rhythm: Normal rate and regular rhythm.     Pulses: Normal pulses.     Heart sounds: Normal heart sounds. No murmur heard.    No friction rub. No gallop.  Pulmonary:     Effort: Pulmonary effort is normal. No respiratory distress.     Breath sounds: Normal breath sounds. No stridor. No wheezing, rhonchi or rales.  Chest:     Chest wall: No tenderness.  Musculoskeletal:        General: Normal range of motion.     Cervical back: Normal range of motion and neck supple.  Skin:    General: Skin is warm and dry.     Capillary Refill: Capillary refill takes less than 2 seconds.     Coloration: Skin is not jaundiced or pale.     Findings: No bruising, erythema, lesion or rash.  Neurological:     General: No focal deficit present.     Mental Status: She is alert and oriented to person, place, and time. Mental status is at baseline.  Psychiatric:        Mood and Affect: Mood normal.        Behavior: Behavior normal.        Thought Content: Thought content normal.        Judgment: Judgment normal.     Results for orders placed or performed in visit on 11/24/23  VITAMIN D 25 Hydroxy (Vit-D Deficiency, Fractures)   Collection Time: 11/24/23 10:24 AM  Result Value Ref Range   Vit D, 25-Hydroxy 11.8 (L) 30.0 - 100.0 ng/mL  Thyroid Panel With TSH   Collection Time: 11/24/23 10:24 AM  Result Value Ref Range   TSH 5.440 (H) 0.450 - 4.500 uIU/mL   T4, Total 5.6 4.5 - 12.0 ug/dL   T3 Uptake Ratio 24 24 - 39 %   Free Thyroxine Index 1.3 1.2 - 4.9  Lithium level   Collection Time: 11/24/23 10:24 AM  Result Value Ref Range   Lithium Lvl 0.1 (L) 0.5 - 1.2 mmol/L  Basic metabolic panel   Collection Time: 11/24/23 10:24 AM  Result Value Ref Range   Glucose 80 70 - 99 mg/dL   BUN 15 6 - 24 mg/dL   Creatinine, Ser 4.09 0.57 - 1.00 mg/dL   eGFR 811 >91 YN/WGN/5.62    BUN/Creatinine Ratio 23 9 - 23   Sodium 140 134 - 144 mmol/L   Potassium 4.7  3.5 - 5.2 mmol/L   Chloride 107 (H) 96 - 106 mmol/L   CO2 17 (L) 20 - 29 mmol/L   Calcium 9.0 8.7 - 10.2 mg/dL  M57   Collection Time: 11/24/23 10:24 AM  Result Value Ref Range   Vitamin B-12 261 232 - 1,245 pg/mL      Assessment & Plan:   Problem List Items Addressed This Visit       Other   Severe episode of recurrent major depressive disorder, without psychotic features (HCC) - Primary   Improved. Tolerating her lithium well. Continue to monitor. Call with any concerns. Refills given.         Follow up plan: Return in about 4 weeks (around 01/07/2024).

## 2023-12-12 ENCOUNTER — Encounter: Payer: Self-pay | Admitting: Family Medicine

## 2023-12-12 NOTE — Assessment & Plan Note (Signed)
 Improved. Tolerating her lithium well. Continue to monitor. Call with any concerns. Refills given.

## 2023-12-27 ENCOUNTER — Ambulatory Visit: Admitting: Student in an Organized Health Care Education/Training Program

## 2024-01-01 ENCOUNTER — Other Ambulatory Visit: Payer: Self-pay | Admitting: Family Medicine

## 2024-01-03 DIAGNOSIS — F4310 Post-traumatic stress disorder, unspecified: Secondary | ICD-10-CM | POA: Diagnosis not present

## 2024-01-03 NOTE — Telephone Encounter (Signed)
 Requested medication (s) are due for refill today: routing for review  Requested medication (s) are on the active medication list: yes  Last refill:  11/24/23  Future visit scheduled: yes  Notes to clinic:  Unable to refill per protocol, cannot delegate.      Requested Prescriptions  Pending Prescriptions Disp Refills   tiZANidine (ZANAFLEX) 4 MG tablet [Pharmacy Med Name: TIZANIDINE HCL 4 MG TABLET] 60 tablet 1    Sig: TAKE 1-2 TABLETS (4-8 MG TOTAL) BY MOUTH AT BEDTIME AS NEEDED FOR MUSCLE SPASMS.     Not Delegated - Cardiovascular:  Alpha-2 Agonists - tizanidine Failed - 01/03/2024  3:06 PM      Failed - This refill cannot be delegated      Passed - Valid encounter within last 6 months    Recent Outpatient Visits           3 weeks ago Severe episode of recurrent major depressive disorder, without psychotic features (HCC)   [place] [facility] [place], Megan P, DO   1 month ago Severe episode of recurrent major depressive disorder, without psychotic features (HCC)   [place] [facility] [place], Megan P, DO   1 month ago Severe episode of recurrent major depressive disorder, without psychotic features (HCC)   [place] [facility] [place], Megan P, DO   1 month ago Severe episode of recurrent major depressive disorder, without psychotic features (HCC)   [place] [facility] [place], Megan P, DO   2 months ago Acute nonintractable headache, unspecified headache type   [place] [facility], Megan P, DO

## 2024-01-04 ENCOUNTER — Inpatient Hospital Stay: Payer: BC Managed Care – PPO

## 2024-01-06 ENCOUNTER — Ambulatory Visit: Payer: BC Managed Care – PPO

## 2024-01-06 ENCOUNTER — Ambulatory Visit: Payer: BC Managed Care – PPO | Admitting: Oncology

## 2024-01-19 ENCOUNTER — Encounter: Payer: Self-pay | Admitting: Oncology

## 2024-01-26 ENCOUNTER — Ambulatory Visit: Admitting: Family Medicine

## 2024-01-27 ENCOUNTER — Other Ambulatory Visit: Payer: Self-pay | Admitting: Family Medicine

## 2024-01-27 DIAGNOSIS — F4310 Post-traumatic stress disorder, unspecified: Secondary | ICD-10-CM

## 2024-01-27 DIAGNOSIS — F332 Major depressive disorder, recurrent severe without psychotic features: Secondary | ICD-10-CM

## 2024-01-28 ENCOUNTER — Ambulatory Visit: Admitting: Family Medicine

## 2024-01-28 ENCOUNTER — Telehealth: Payer: Self-pay | Admitting: Family Medicine

## 2024-01-28 NOTE — Telephone Encounter (Signed)
 We do not have current insurance on file in order to complete the needed PA that insurance requires. When she returns call please ask her to send a photo through [place] of her current insurance.

## 2024-01-28 NOTE — Telephone Encounter (Signed)
 Copied from CRM [id number]. Topic: Clinical - Prescription Issue >> Jan 28, 2024  1:28 PM Stanly Early wrote: Reason for CRM: provider needs to choose a different medication, Pt got a text from their pharmacy "that their medication can not be refilled" sertraline  (ZOLOFT ) 100 MG tablet, patient would like communication through [place] for a follow up, Their pcp is also out of office for about two weeks. I was unable to make an appt

## 2024-01-31 NOTE — Telephone Encounter (Signed)
 Requested medication (s) are due for refill today:   Yes  Requested medication (s) are on the active medication list:   Yes  Future visit scheduled:    No.      Last ordered: 11/24/2023 #225, 1 refill  Unable to refill because a PA is being requested.  It looks like insurance information is needed.      Requested Prescriptions  Pending Prescriptions Disp Refills   sertraline  (ZOLOFT ) 100 MG tablet [Pharmacy Med Name: SERTRALINE  HCL 100 MG TABLET] 225 tablet 1    Sig: Take 2.5 tablets (250 mg total) by mouth as directed.     Psychiatry:  Antidepressants - SSRI - sertraline  Passed - 01/31/2024 10:31 AM      Passed - AST in normal range and within 360 days    AST  Date Value Ref Range Status  04/30/2023 18 0 - 40 IU/L Final         Passed - ALT in normal range and within 360 days    ALT  Date Value Ref Range Status  04/30/2023 17 0 - 32 IU/L Final         Passed - Completed PHQ-2 or PHQ-9 in the last 360 days      Passed - Valid encounter within last 6 months    Recent Outpatient Visits           1 month ago Severe episode of recurrent major depressive disorder, without psychotic features (HCC)   [facility] [place], Megan P, DO   2 months ago Severe episode of recurrent major depressive disorder, without psychotic features (HCC)   [place] [facility] [place], Megan P, DO   2 months ago Severe episode of recurrent major depressive disorder, without psychotic features (HCC)   [place] [facility] [place], Megan P, DO   2 months ago Severe episode of recurrent major depressive disorder, without psychotic features (HCC)   [place] [facility] [place], Megan P, DO   3 months ago Acute nonintractable headache, unspecified headache type    [facility] [place], Megan P, DO

## 2024-02-10 DIAGNOSIS — F331 Major depressive disorder, recurrent, moderate: Secondary | ICD-10-CM | POA: Diagnosis not present

## 2024-02-10 DIAGNOSIS — F509 Eating disorder, unspecified: Secondary | ICD-10-CM | POA: Diagnosis not present

## 2024-02-10 DIAGNOSIS — F4312 Post-traumatic stress disorder, chronic: Secondary | ICD-10-CM | POA: Diagnosis not present

## 2024-02-10 DIAGNOSIS — F411 Generalized anxiety disorder: Secondary | ICD-10-CM | POA: Diagnosis not present

## 2024-02-23 ENCOUNTER — Inpatient Hospital Stay: Payer: Self-pay | Attending: Oncology

## 2024-02-23 DIAGNOSIS — D508 Other iron deficiency anemias: Secondary | ICD-10-CM

## 2024-02-23 DIAGNOSIS — Z79899 Other long term (current) drug therapy: Secondary | ICD-10-CM | POA: Diagnosis not present

## 2024-02-23 DIAGNOSIS — Z801 Family history of malignant neoplasm of trachea, bronchus and lung: Secondary | ICD-10-CM | POA: Diagnosis not present

## 2024-02-23 DIAGNOSIS — D509 Iron deficiency anemia, unspecified: Secondary | ICD-10-CM | POA: Insufficient documentation

## 2024-02-23 DIAGNOSIS — Z8042 Family history of malignant neoplasm of prostate: Secondary | ICD-10-CM | POA: Diagnosis not present

## 2024-02-23 DIAGNOSIS — E538 Deficiency of other specified B group vitamins: Secondary | ICD-10-CM | POA: Insufficient documentation

## 2024-02-23 DIAGNOSIS — Z9884 Bariatric surgery status: Secondary | ICD-10-CM | POA: Insufficient documentation

## 2024-02-23 LAB — CBC WITH DIFFERENTIAL (CANCER CENTER ONLY)
Abs Immature Granulocytes: 0.04 10*3/uL (ref 0.00–0.07)
Basophils Absolute: 0.1 10*3/uL (ref 0.0–0.1)
Basophils Relative: 1 %
Eosinophils Absolute: 0.3 10*3/uL (ref 0.0–0.5)
Eosinophils Relative: 4 %
HCT: 42.3 % (ref 36.0–46.0)
Hemoglobin: 13.5 g/dL (ref 12.0–15.0)
Immature Granulocytes: 1 %
Lymphocytes Relative: 27 %
Lymphs Abs: 2.1 10*3/uL (ref 0.7–4.0)
MCH: 28 pg (ref 26.0–34.0)
MCHC: 31.9 g/dL (ref 30.0–36.0)
MCV: 87.8 fL (ref 80.0–100.0)
Monocytes Absolute: 0.5 10*3/uL (ref 0.1–1.0)
Monocytes Relative: 6 %
Neutro Abs: 5 10*3/uL (ref 1.7–7.7)
Neutrophils Relative %: 61 %
Platelet Count: 270 10*3/uL (ref 150–400)
RBC: 4.82 MIL/uL (ref 3.87–5.11)
RDW: 13 % (ref 11.5–15.5)
WBC Count: 8 10*3/uL (ref 4.0–10.5)
nRBC: 0 % (ref 0.0–0.2)

## 2024-02-23 LAB — IRON AND TIBC
Iron: 99 ug/dL (ref 28–170)
Saturation Ratios: 27 % (ref 10.4–31.8)
TIBC: 365 ug/dL (ref 250–450)
UIBC: 266 ug/dL

## 2024-02-23 LAB — RETIC PANEL
Immature Retic Fract: 4.8 % (ref 2.3–15.9)
RBC.: 4.78 MIL/uL (ref 3.87–5.11)
Retic Count, Absolute: 62.6 10*3/uL (ref 19.0–186.0)
Retic Ct Pct: 1.3 % (ref 0.4–3.1)
Reticulocyte Hemoglobin: 31.5 pg (ref >27.9)

## 2024-02-23 LAB — FERRITIN: Ferritin: 20 ng/mL (ref 11–307)

## 2024-02-23 LAB — VITAMIN B12: Vitamin B-12: 390 pg/mL (ref 180–914)

## 2024-02-25 ENCOUNTER — Encounter: Payer: Self-pay | Admitting: Oncology

## 2024-02-25 ENCOUNTER — Inpatient Hospital Stay: Payer: Self-pay

## 2024-02-25 ENCOUNTER — Inpatient Hospital Stay: Payer: Self-pay | Admitting: Oncology

## 2024-02-25 VITALS — BP 106/68 | HR 61 | Temp 97.4°F | Resp 18 | Wt 281.0 lb

## 2024-02-25 DIAGNOSIS — Z9884 Bariatric surgery status: Secondary | ICD-10-CM | POA: Diagnosis not present

## 2024-02-25 DIAGNOSIS — F4312 Post-traumatic stress disorder, chronic: Secondary | ICD-10-CM | POA: Diagnosis not present

## 2024-02-25 DIAGNOSIS — D509 Iron deficiency anemia, unspecified: Secondary | ICD-10-CM | POA: Diagnosis not present

## 2024-02-25 DIAGNOSIS — Z8042 Family history of malignant neoplasm of prostate: Secondary | ICD-10-CM | POA: Diagnosis not present

## 2024-02-25 DIAGNOSIS — D508 Other iron deficiency anemias: Secondary | ICD-10-CM | POA: Diagnosis not present

## 2024-02-25 DIAGNOSIS — F331 Major depressive disorder, recurrent, moderate: Secondary | ICD-10-CM | POA: Diagnosis not present

## 2024-02-25 DIAGNOSIS — E538 Deficiency of other specified B group vitamins: Secondary | ICD-10-CM | POA: Diagnosis not present

## 2024-02-25 DIAGNOSIS — F411 Generalized anxiety disorder: Secondary | ICD-10-CM | POA: Diagnosis not present

## 2024-02-25 DIAGNOSIS — F509 Eating disorder, unspecified: Secondary | ICD-10-CM | POA: Diagnosis not present

## 2024-02-25 DIAGNOSIS — Z79899 Other long term (current) drug therapy: Secondary | ICD-10-CM | POA: Diagnosis not present

## 2024-02-25 DIAGNOSIS — Z801 Family history of malignant neoplasm of trachea, bronchus and lung: Secondary | ICD-10-CM | POA: Diagnosis not present

## 2024-02-25 NOTE — Assessment & Plan Note (Addendum)
 Iron  deficiency in the context of history of gastric bypass Lab Results  Component Value Date   HGB 13.5 02/23/2024   TIBC 365 02/23/2024   IRONPCTSAT 27 02/23/2024   FERRITIN 20 02/23/2024    Hold venofer  today.

## 2024-02-25 NOTE — Assessment & Plan Note (Signed)
 She gets weekly  vitamin B12 injections managed by psychiatrist

## 2024-02-25 NOTE — Progress Notes (Signed)
 Hematology/Oncology Progress note [phone] [phone]     CHIEF COMPLIANT: Sharon Gibson is a 43 y.o. female presents for follow up of  iron  and B12 deficiency   ASSESSMENT & PLAN:   Iron  deficiency anemia Iron  deficiency in the context of history of gastric bypass Lab Results  Component Value Date   HGB 13.5 02/23/2024   TIBC 365 02/23/2024   IRONPCTSAT 27 02/23/2024   FERRITIN 20 02/23/2024    Hold venofer  today.   B12 deficiency She gets weekly  vitamin B12 injections managed by psychiatrist     Orders Placed This Encounter  Procedures   CBC with Differential (Cancer Center Only)    Standing Status:   Future    Expected Date:   08/27/2024    Expiration Date:   02/24/2025   Iron  and TIBC    Standing Status:   Future    Expected Date:   08/27/2024    Expiration Date:   02/24/2025   Ferritin    Standing Status:   Future    Expected Date:   08/27/2024    Expiration Date:   02/24/2025   Retic Panel    Standing Status:   Future    Expected Date:   08/27/2024    Expiration Date:   02/24/2025   Follow up in 6 months.  All questions were answered. The patient knows to call the clinic with any problems, questions or concerns.  Timmy Forbes, MD, PhD [facility] Hematology Oncology 02/25/2024   PERTINENT HEMATOLOGY HISTORY Patient previously followed up by Dr.Corcoran, patient switched care to me on 05/08/21 Extensive medical record review was performed by me  #Patient has a history of gastric bypass. #Iron  deficiency anemia, she has received IV iron  (different preparations) x 5 ([place], [place]; [place], [place];  [place]) with reactions requiring Benadryl  and steroids. Per patient she had side effects from IV Venofer  treatments, including dizziness, lightheadedness, low blood pressure, excess sweating, aches, and pains, nausea, and headache after her infusion. She has received Feraheme  treatments as well as Venofer  treatments previously.  Most  recently Venofer  treatments.  Patient gets Benadryl , steroids as premed to minimize adverse reactions.  #She has B12 deficiency.  Patient was previously on monthly vitamin B12 injections. Due to her work, she has not been able to come to get monthly injections.   #History of multiple pulmonary emboli in 2003.  Per patient report, hypercoagulable work-up was negative.  She was on Coumadin x 6 months.  Not currently on anticoagulation.   INTERVAL HISTORY Sharon Gibson is a 43 y.o. female who has above history reviewed by me not presents for follow up visit for management of iron  deficiency anemia and vitamin B12 deficiency Some fatigue, chronic, no other new complaints. She currently gets vitamin B12 injections, managed by psychiatrist.  Past Medical History:  Diagnosis Date   Anemia    Anxiety    Chicken pox    Depression    Eating disorder    Has had residential treatment and hospitalization previously.   Heart murmur    as a child   History of anemia    RECIEVES IRON  INFUSIONS YEARLY   History of pulmonary embolus (PE) 2002   bilaterally-followed by [facility] hematology-w/u negative per pt   Hypothyroidism    no meds currently   OCD (obsessive compulsive disorder)    PTSD (post-traumatic stress disorder)     Past Surgical History:  Procedure Laterality Date   CHOLECYSTECTOMY  2003   EXCISION OF ENDOMETRIOMA  GASTRIC BYPASS  2003   LAPAROSCOPIC OVARIAN CYSTECTOMY Left 05/10/2018   Procedure: LAPAROSCOPIC OVARIAN CYSTECTOMY;  Surgeon: Heron Lord, MD;  Location: [facility] ORS;  Service: Gynecology;  Laterality: Left;    Family History  Problem Relation Age of Onset   Depression Father    Diabetes Father    Anxiety disorder Father    Alzheimer's disease Maternal Grandfather    Alzheimer's disease Maternal Grandmother    Prostate cancer Paternal Grandfather    Lung cancer Paternal Grandmother     Social History:  reports that she has never smoked. She has never  used smokeless tobacco. She reports that she does not currently use alcohol. She reports that she does not use drugs.   Allergies:  Allergies  Allergen Reactions   Penicillins Hives    Has patient had a PCN reaction causing immediate rash, facial/tongue/throat swelling, SOB or lightheadedness with hypotension: yes Has patient had a PCN reaction causing severe rash involving mucus membranes or skin necrosis: no Has patient had a PCN reaction that required hospitalization: yes Has patient had a PCN reaction occurring within the last 10 years: no If all of the above answers are "NO", then may proceed with Cephalosporin use.    Strawberry (Diagnostic) Swelling    AND WALNUTS-TONGUE SWELLING   Other Other (See Comments)    Narcotics- Burning in the stomach (whether PO or IV)   Celebrex  [Celecoxib ] Nausea And Vomiting   Nsaids Other (See Comments)   Azithromycin  Nausea Only    Current Medications: Current Outpatient Medications  Medication Sig Dispense Refill   clonazePAM  (KLONOPIN ) 1 MG tablet Take 1 tablet (1 mg total) by mouth daily as needed for anxiety. 30 tablet 1   DULoxetine (CYMBALTA) 30 MG capsule Take by mouth.     gabapentin  (NEURONTIN ) 300 MG capsule Take 2 capsules (600 mg total) by mouth at bedtime. 180 capsule 1   levothyroxine  (SYNTHROID ) 100 MCG tablet Take 1 tablet (100 mcg total) by mouth daily. 90 tablet 0   sertraline  (ZOLOFT ) 100 MG tablet Take 2.5 tablets (250 mg total) by mouth as directed. 225 tablet 1   tiZANidine  (ZANAFLEX ) 4 MG tablet TAKE 1 TABLET BY MOUTH AT BEDTIME AS NEEDED FOR MUSCLE SPASMS. 90 tablet 1   traZODone  (DESYREL ) 100 MG tablet Take 100 mg by mouth at bedtime.     triamcinolone  cream (KENALOG ) 0.1 % Apply 1 Application topically 2 (two) times daily. 30 g 0   Vitamin D , Ergocalciferol , (DRISDOL ) 1.25 MG (50000 UNIT) CAPS capsule Take 1 capsule (50,000 Units total) by mouth every 7 (seven) days. 12 capsule 1   lithium  carbonate 150 MG capsule Take  1 capsule (150 mg total) by mouth daily. (Patient not taking: Reported on 02/25/2024) 90 capsule 1   No current facility-administered medications for this visit.    Review of Systems  Constitutional:  Negative for chills, fever and malaise/fatigue.  HENT:  Negative for sore throat.   Eyes:  Negative for redness.  Respiratory:  Negative for cough, shortness of breath and wheezing.   Cardiovascular:  Negative for chest pain, palpitations and leg swelling.  Gastrointestinal:  Negative for abdominal pain, blood in stool, nausea and vomiting.  Genitourinary:  Negative for dysuria.  Musculoskeletal:  Negative for myalgias.  Skin:  Negative for rash.  Neurological:  Negative for dizziness, tingling and tremors.  Endo/Heme/Allergies:  Does not bruise/bleed easily.  Psychiatric/Behavioral:  Negative for hallucinations.    Performance status (ECOG): 1  Vitals Blood pressure 106/68, pulse  61, temperature (!) 97.4 F (36.3 C), resp. rate 18, weight 281 lb (127.5 kg), SpO2 98%.   Physical Exam Constitutional:      General: She is not in acute distress.    Appearance: She is well-developed. She is not diaphoretic.  HENT:     Head: Normocephalic.  Eyes:     General: No scleral icterus.    Pupils: Pupils are equal, round, and reactive to light.  Neck:     Vascular: No JVD.  Cardiovascular:     Rate and Rhythm: Normal rate and regular rhythm.     Heart sounds: Murmur heard.  Pulmonary:     Effort: Pulmonary effort is normal. No respiratory distress.     Breath sounds: Normal breath sounds.  Abdominal:     General: Bowel sounds are normal. There is no distension.     Palpations: Abdomen is soft.  Musculoskeletal:        General: Normal range of motion.     Cervical back: Normal range of motion and neck supple.  Lymphadenopathy:     Head:     Right side of head: No preauricular, posterior auricular or occipital adenopathy.     Left side of head: No preauricular, posterior auricular or  occipital adenopathy.     Cervical: No cervical adenopathy.     Upper Body:     Right upper body: No supraclavicular adenopathy.     Left upper body: No supraclavicular adenopathy.     Lower Body: No right inguinal adenopathy. No left inguinal adenopathy.  Skin:    General: Skin is warm and dry.  Neurological:     Mental Status: She is alert and oriented to person, place, and time. Mental status is at baseline.  Psychiatric:        Mood and Affect: Mood normal.    Laboratory studies    Latest Ref Rng & Units 02/23/2024    1:57 PM 06/29/2023    3:20 PM 12/29/2022   10:59 AM  CBC  WBC 4.0 - 10.5 K/uL 8.0  10.1  7.9   Hemoglobin 12.0 - 15.0 g/dL 21.3  08.6  57.8   Hematocrit 36.0 - 46.0 % 42.3  40.9  40.1   Platelets 150 - 400 K/uL 270  307  288       Latest Ref Rng & Units 11/24/2023   10:24 AM 04/30/2023   10:28 AM 12/29/2022   10:59 AM  CMP  Glucose 70 - 99 mg/dL 80  85  91   BUN 6 - 24 mg/dL 15  14  15    Creatinine 0.57 - 1.00 mg/dL 4.69  6.29  5.28   Sodium 134 - 144 mmol/L 140  141  139   Potassium 3.5 - 5.2 mmol/L 4.7  3.9  4.6   Chloride 96 - 106 mmol/L 107  103  105   CO2 20 - 29 mmol/L 17  22  17    Calcium 8.7 - 10.2 mg/dL 9.0  9.2  9.7   Total Protein 6.0 - 8.5 g/dL  6.6  6.6   Total Bilirubin 0.0 - 1.2 mg/dL  0.4  0.3   Alkaline Phos 44 - 121 IU/L  89  102   AST 0 - 40 IU/L  18  20   ALT 0 - 32 IU/L  17  19    Lab Results  Component Value Date   IRON  99 02/23/2024   TIBC 365 02/23/2024   FERRITIN 20 02/23/2024

## 2024-02-26 ENCOUNTER — Encounter (INDEPENDENT_AMBULATORY_CARE_PROVIDER_SITE_OTHER): Payer: Self-pay

## 2024-03-01 ENCOUNTER — Other Ambulatory Visit: Payer: Self-pay | Admitting: Family Medicine

## 2024-03-02 NOTE — Telephone Encounter (Signed)
 Requested Prescriptions  Pending Prescriptions Disp Refills   levothyroxine  (SYNTHROID ) 100 MCG tablet [Pharmacy Med Name: LEVOTHYROXINE  100 MCG TABLET] 90 tablet 0    Sig: TAKE 1 TABLET BY MOUTH EVERY DAY     Endocrinology:  Hypothyroid Agents Failed - 03/02/2024  9:57 AM      Failed - TSH in normal range and within 360 days    TSH  Date Value Ref Range Status  11/24/2023 5.440 (H) 0.450 - 4.500 uIU/mL Final         Passed - Valid encounter within last 12 months    Recent Outpatient Visits           2 months ago Severe episode of recurrent major depressive disorder, without psychotic features (HCC)   [place] [facility] [place], Megan P, DO   3 months ago Severe episode of recurrent major depressive disorder, without psychotic features (HCC)   [place] [facility] [place], Megan P, DO   3 months ago Severe episode of recurrent major depressive disorder, without psychotic features (HCC)   [place] [facility] [place], Megan P, DO   3 months ago Severe episode of recurrent major depressive disorder, without psychotic features (HCC)   [place] [facility] [place], Megan P, DO   4 months ago Acute nonintractable headache, unspecified headache type   [place] [facility] [place] Dupre, DO       Future Appointments             In 4 months Timmy Forbes, MD [facility] [place] [facility] - [facility] [place]

## 2024-03-17 DIAGNOSIS — F411 Generalized anxiety disorder: Secondary | ICD-10-CM | POA: Diagnosis not present

## 2024-03-17 DIAGNOSIS — F4312 Post-traumatic stress disorder, chronic: Secondary | ICD-10-CM | POA: Diagnosis not present

## 2024-03-17 DIAGNOSIS — F331 Major depressive disorder, recurrent, moderate: Secondary | ICD-10-CM | POA: Diagnosis not present

## 2024-03-17 DIAGNOSIS — F509 Eating disorder, unspecified: Secondary | ICD-10-CM | POA: Diagnosis not present

## 2024-04-14 DIAGNOSIS — F331 Major depressive disorder, recurrent, moderate: Secondary | ICD-10-CM | POA: Diagnosis not present

## 2024-04-14 DIAGNOSIS — F509 Eating disorder, unspecified: Secondary | ICD-10-CM | POA: Diagnosis not present

## 2024-04-14 DIAGNOSIS — F4312 Post-traumatic stress disorder, chronic: Secondary | ICD-10-CM | POA: Diagnosis not present

## 2024-04-14 DIAGNOSIS — F411 Generalized anxiety disorder: Secondary | ICD-10-CM | POA: Diagnosis not present

## 2024-04-25 ENCOUNTER — Telehealth: Payer: Self-pay

## 2024-04-25 ENCOUNTER — Encounter: Payer: Self-pay | Admitting: Oncology

## 2024-04-25 ENCOUNTER — Other Ambulatory Visit (HOSPITAL_COMMUNITY): Payer: Self-pay

## 2024-04-25 NOTE — Telephone Encounter (Signed)
 Pharmacy Patient Advocate Encounter   Received notification from CoverMyMeds that prior authorization for Lithium  Carbonate 150MG  capsules is required/requested.   Insurance verification completed.   The patient is insured through [facility] .   Per test claim: PA required; PA submitted to above mentioned insurance via CoverMyMeds Key/confirmation #/EOC [id number] Status is pending

## 2024-04-27 NOTE — Telephone Encounter (Signed)
 Pharmacy Patient Advocate Encounter  Received notification from [facility] that Prior Authorization for Lithium  Carbonate 150MG  capsules has been APPROVED from 04/25/2024 to 04/25/2025   PA #/Case ID/Reference #: [id number]

## 2024-05-01 ENCOUNTER — Ambulatory Visit: Payer: Self-pay

## 2024-05-01 NOTE — Telephone Encounter (Signed)
 FYI Only or Action Required?: Action required by provider: request for appointment.  Patient was last seen in primary care on 12/10/2023 by Vicci Duwaine SQUIBB, DO.  Called Nurse Triage reporting Dizziness.  Symptoms began several weeks ago.  Interventions attempted: Nothing.  Symptoms are: gradually worsening. Feels shaky with the dizziness.  Triage Disposition: See Physician Within 24 Hours  Patient/caregiver understands and will follow disposition?: Yes   Copied from CRM #[id number]. Topic: Clinical - Red Word Triage >> May 01, 2024  8:18 AM Franky GRADE wrote: Red Word that prompted transfer to Nurse Triage: Patient is experiencing dizziness spells for the last couple of weeks, cold sweats and a trembling sensation. Answer Assessment - Initial Assessment Questions 1. DESCRIPTION: Describe your dizziness.     shaky 2. LIGHTHEADED: Do you feel lightheaded? (e.g., somewhat faint, woozy, weak upon standing)     yes 3. VERTIGO: Do you feel like either you or the room is spinning or tilting? (i.e., vertigo)     no 4. SEVERITY: How bad is it?  Do you feel like you are going to faint? Can you stand and walk?     no 5. ONSET:  When did the dizziness begin?     2-3 weeks 6. AGGRAVATING FACTORS: Does anything make it worse? (e.g., standing, change in head position)     no 7. HEART RATE: Can you tell me your heart rate? How many beats in 15 seconds?  (Note: Not all patients can do this.)       no 8. CAUSE: What do you think is causing the dizziness? (e.g., decreased fluids or food, diarrhea, emotional distress, heat exposure, new medicine, sudden standing, vomiting; unknown)     unsure 9. RECURRENT SYMPTOM: Have you had dizziness before? If Yes, ask: When was the last time? What happened that time?     no 10. OTHER SYMPTOMS: Do you have any other symptoms? (e.g., fever, chest pain, vomiting, diarrhea, bleeding)       Feels shaky 11. PREGNANCY: Is there any chance  you are pregnant? When was your last menstrual period?       no  Protocols used: Dizziness - Lightheadedness-A-AH  Reason for Disposition  [1] MODERATE dizziness (e.g., interferes with normal activities) AND [2] has NOT been evaluated by doctor (or NP/PA) for this  (Exception: Dizziness caused by heat exposure, sudden standing, or poor fluid intake.)  Answer Assessment - Initial Assessment Questions 1. DESCRIPTION: Describe your dizziness.     shaky 2. LIGHTHEADED: Do you feel lightheaded? (e.g., somewhat faint, woozy, weak upon standing)     yes 3. VERTIGO: Do you feel like either you or the room is spinning or tilting? (i.e., vertigo)     no 4. SEVERITY: How bad is it?  Do you feel like you are going to faint? Can you stand and walk?     no 5. ONSET:  When did the dizziness begin?     2-3 weeks 6. AGGRAVATING FACTORS: Does anything make it worse? (e.g., standing, change in head position)     no 7. HEART RATE: Can you tell me your heart rate? How many beats in 15 seconds?  (Note: Not all patients can do this.)       no 8. CAUSE: What do you think is causing the dizziness? (e.g., decreased fluids or food, diarrhea, emotional distress, heat exposure, new medicine, sudden standing, vomiting; unknown)     unsure 9. RECURRENT SYMPTOM: Have you had dizziness before? If Yes, ask: When was  the last time? What happened that time?     no 10. OTHER SYMPTOMS: Do you have any other symptoms? (e.g., fever, chest pain, vomiting, diarrhea, bleeding)       Feels shaky 11. PREGNANCY: Is there any chance you are pregnant? When was your last menstrual period?       no  Protocols used: Dizziness - Lightheadedness-A-AH

## 2024-05-02 ENCOUNTER — Ambulatory Visit (INDEPENDENT_AMBULATORY_CARE_PROVIDER_SITE_OTHER): Admitting: Pediatrics

## 2024-05-02 VITALS — BP 138/83 | HR 73 | Temp 98.8°F

## 2024-05-02 DIAGNOSIS — R232 Flushing: Secondary | ICD-10-CM | POA: Diagnosis not present

## 2024-05-02 DIAGNOSIS — E039 Hypothyroidism, unspecified: Secondary | ICD-10-CM | POA: Diagnosis not present

## 2024-05-02 DIAGNOSIS — R4189 Other symptoms and signs involving cognitive functions and awareness: Secondary | ICD-10-CM | POA: Diagnosis not present

## 2024-05-02 DIAGNOSIS — R002 Palpitations: Secondary | ICD-10-CM | POA: Diagnosis not present

## 2024-05-02 DIAGNOSIS — F4310 Post-traumatic stress disorder, unspecified: Secondary | ICD-10-CM

## 2024-05-02 NOTE — Patient Instructions (Signed)
 They will mail out the holter monitor (zio) to your home. It should have instructions but let us  know if any questions.  I will message once we have lab results and next steps

## 2024-05-02 NOTE — Progress Notes (Signed)
 Office Visit  BP 138/83   Pulse 73   Temp 98.8 F (37.1 C) (Oral)   LMP 04/12/2024 (Approximate)   SpO2 98%    Subjective:    Patient ID: Sharon Gibson, female    DOB: 1981/05/25, 43 y.o.   MRN: [id number]  HPI: [name] is a 43 y.o. female  Chief Complaint  Patient presents with   Hot Flashes    Confused sometimes , clammy , weakness     Discussed the use of AI scribe software for clinical note transcription with the patient, who gave verbal consent to proceed.  History of Present Illness   Sharon Gibson is a 43 year old female who presents with episodes of flushing, high blood pressure, and palpitations.  She has been experiencing episodes of flushing for the past four to five weeks, describing them as a sensation of heat and tightness across her cheeks and nose, similar to an '[place] sunburn'. These episodes have increased in frequency, occurring daily over the past weekend. During these episodes, she also experiences sweating, particularly on the bottom of her feet.  Her blood pressure has been elevated during these episodes, despite her usual baseline of low blood pressure. Her father, who is diabetic, has checked her blood sugar during these episodes, and it has remained within normal ranges. She also experiences palpitations and chest pain during these episodes, which she feels currently.  She recently stopped taking lithium  approximately six weeks ago and has run out of Synthroid , her thyroid  medication. She reports increased irritability and a lack of patience, which is unusual for her, as she typically considers herself laid back.  She describes experiencing 'fogginess' or difficulty in forming complete thoughts, which she finds concerning. She has not gone through menopause but is still experiencing regular periods despite using a Nexplanon  implant. She recalls having blood work done last summer to check for menopause-related changes, which did not  indicate menopause at that time.      Relevant past medical, surgical, family and social history reviewed and updated as indicated. Interim medical history since our last visit reviewed. Allergies and medications reviewed and updated.  ROS per HPI unless specifically indicated above     Objective:    BP 138/83   Pulse 73   Temp 98.8 F (37.1 C) (Oral)   LMP 04/12/2024 (Approximate)   SpO2 98%   Wt Readings from Last 3 Encounters:  02/25/24 281 lb (127.5 kg)  09/08/23 279 lb (126.6 kg)  08/23/23 271 lb (122.9 kg)     Physical Exam Constitutional:      Appearance: Normal appearance.  HENT:     Head: Normocephalic and atraumatic.  Eyes:     Pupils: Pupils are equal, round, and reactive to light.  Cardiovascular:     Rate and Rhythm: Normal rate and regular rhythm.     Pulses: Normal pulses.     Heart sounds: Normal heart sounds.  Pulmonary:     Effort: Pulmonary effort is normal.     Breath sounds: Normal breath sounds.  Abdominal:     General: Abdomen is flat.     Palpations: Abdomen is soft.  Musculoskeletal:        General: Normal range of motion.     Cervical back: Normal range of motion.  Skin:    General: Skin is warm and dry.     Capillary Refill: Capillary refill takes less than 2 seconds.  Neurological:     General: No focal deficit present.  Mental Status: She is alert. Mental status is at baseline.  Psychiatric:        Mood and Affect: Mood normal.        Behavior: Behavior normal.         05/02/2024    4:33 PM 11/24/2023   10:35 AM 11/05/2023    8:36 AM 11/01/2023    3:50 PM 10/21/2023    3:57 PM  Depression screen PHQ 2/9  Decreased Interest 3 2 3 3 3   Down, Depressed, Hopeless 3 2 3 3 3   PHQ - 2 Score 6 4 6 6 6   Altered sleeping 1 3 3 3 3   Tired, decreased energy 3 3 3 3 3   Change in appetite 3 3 3 3 3   Feeling bad or failure about yourself  2 2 3 3 3   Trouble concentrating 3 2 3 3 3   Moving slowly or fidgety/restless 3 3 3 3 3   Suicidal  thoughts 3 2 3 3 3   PHQ-9 Score 24 22 27 27 27   Difficult doing work/chores Extremely dIfficult Very difficult Extremely dIfficult Extremely dIfficult Extremely dIfficult       05/02/2024    4:33 PM 11/24/2023   10:35 AM 11/05/2023    8:36 AM 11/01/2023    3:50 PM  GAD 7 : Generalized Anxiety Score  Nervous, Anxious, on Edge 3 3 3 3   Control/stop worrying 3 3 3 3   Worry too much - different things 3 3 3 3   Trouble relaxing 3 3 3 3   Restless 3 3 3 3   Easily annoyed or irritable 3 3 3 3   Afraid - awful might happen 3 3 3 3   Total GAD 7 Score 21 21 21 21   Anxiety Difficulty Extremely difficult Extremely difficult Extremely difficult Extremely difficult   EKG  NSR, no ST or T wave changes     Assessment & Plan:  Assessment & Plan   Facial flushing Brain fog Palpitations Intermittent episodes of facial flushing, brain fog, palpitations are increasing in frequency. Recent medication changes are noted, and previous menopause labs were unremarkable. Ddx includes perimenopause, electrolyte disturbance, thyroid  abnormalities, and/or psychiatric pathology. EKG wnl here in office but will plan on blood work and holter.  -     EKG 12-Lead -     CBC with Differential/Platelet -     Comprehensive metabolic panel with GFR -     Hemoglobin A1c -     FSH/LH -     Magnesium -     Thyroid  Panel With TSH -     LONG TERM MONITOR (3-14 DAYS); Future   Acquired hypothyroidism Synthroid  dosing lapsed for a few days. Outside range in the past, no recent thyroid  function tests have been conducted. Order thyroid  function tests. -     Thyroid  Panel With TSH   Follow up plan: Return in about 2 weeks (around 05/16/2024).  Hadassah SHAUNNA Nett, MD

## 2024-05-03 ENCOUNTER — Ambulatory Visit: Attending: Pediatrics

## 2024-05-03 ENCOUNTER — Ambulatory Visit: Payer: Self-pay | Admitting: Pediatrics

## 2024-05-03 ENCOUNTER — Other Ambulatory Visit: Payer: Self-pay | Admitting: Pediatrics

## 2024-05-03 DIAGNOSIS — R002 Palpitations: Secondary | ICD-10-CM

## 2024-05-03 DIAGNOSIS — E039 Hypothyroidism, unspecified: Secondary | ICD-10-CM

## 2024-05-03 LAB — COMPREHENSIVE METABOLIC PANEL WITH GFR
ALT: 12 IU/L (ref 0–32)
AST: 15 IU/L (ref 0–40)
Albumin: 4.4 g/dL (ref 3.9–4.9)
Alkaline Phosphatase: 102 IU/L (ref 44–121)
BUN/Creatinine Ratio: 27 — ABNORMAL HIGH (ref 9–23)
BUN: 19 mg/dL (ref 6–24)
Bilirubin Total: 0.2 mg/dL (ref 0.0–1.2)
CO2: 17 mmol/L — ABNORMAL LOW (ref 20–29)
Calcium: 9.4 mg/dL (ref 8.7–10.2)
Chloride: 107 mmol/L — ABNORMAL HIGH (ref 96–106)
Creatinine, Ser: 0.7 mg/dL (ref 0.57–1.00)
Globulin, Total: 2.4 g/dL (ref 1.5–4.5)
Glucose: 76 mg/dL (ref 70–99)
Potassium: 4 mmol/L (ref 3.5–5.2)
Sodium: 139 mmol/L (ref 134–144)
Total Protein: 6.8 g/dL (ref 6.0–8.5)
eGFR: 109 mL/min/1.73 (ref >59)

## 2024-05-03 LAB — CBC WITH DIFFERENTIAL/PLATELET
Basophils Absolute: 0.1 x10E3/uL (ref 0.0–0.2)
Basos: 1 %
EOS (ABSOLUTE): 0.3 x10E3/uL (ref 0.0–0.4)
Eos: 3 %
Hematocrit: 41.5 % (ref 34.0–46.6)
Hemoglobin: 13.7 g/dL (ref 11.1–15.9)
Immature Grans (Abs): 0.1 x10E3/uL (ref 0.0–0.1)
Immature Granulocytes: 1 %
Lymphocytes Absolute: 2.8 x10E3/uL (ref 0.7–3.1)
Lymphs: 25 %
MCH: 29.1 pg (ref 26.6–33.0)
MCHC: 33 g/dL (ref 31.5–35.7)
MCV: 88 fL (ref 79–97)
Monocytes Absolute: 0.8 x10E3/uL (ref 0.1–0.9)
Monocytes: 7 %
Neutrophils Absolute: 7 x10E3/uL (ref 1.4–7.0)
Neutrophils: 63 %
Platelets: 335 x10E3/uL (ref 150–450)
RBC: 4.7 x10E6/uL (ref 3.77–5.28)
RDW: 12.6 % (ref 11.7–15.4)
WBC: 11 x10E3/uL — ABNORMAL HIGH (ref 3.4–10.8)

## 2024-05-03 LAB — HEMOGLOBIN A1C
Est. average glucose Bld gHb Est-mCnc: 105 mg/dL
Hgb A1c MFr Bld: 5.3 % (ref 4.8–5.6)

## 2024-05-03 LAB — THYROID PANEL WITH TSH
Free Thyroxine Index: 1 — ABNORMAL LOW (ref 1.2–4.9)
T3 Uptake Ratio: 19 % — ABNORMAL LOW (ref 24–39)
T4, Total: 5.4 ug/dL (ref 4.5–12.0)
TSH: 9.2 u[IU]/mL — ABNORMAL HIGH (ref 0.450–4.500)

## 2024-05-03 LAB — FSH/LH
FSH: 2.8 m[IU]/mL
LH: 6.5 m[IU]/mL

## 2024-05-03 LAB — MAGNESIUM: Magnesium: 1.9 mg/dL (ref 1.6–2.3)

## 2024-05-03 MED ORDER — LEVOTHYROXINE SODIUM 150 MCG PO TABS: 150.0000 ug | ORAL_TABLET | Freq: Every day | ORAL | 2 refills | Status: DC

## 2024-05-03 NOTE — Progress Notes (Signed)
 Sent increased synthroid  .  Hadassah SHAUNNA Nett, MD

## 2024-05-07 ENCOUNTER — Encounter: Payer: Self-pay | Admitting: Pediatrics

## 2024-05-10 ENCOUNTER — Encounter: Payer: Self-pay | Admitting: Family Medicine

## 2024-05-10 DIAGNOSIS — F509 Eating disorder, unspecified: Secondary | ICD-10-CM | POA: Diagnosis not present

## 2024-05-10 DIAGNOSIS — F39 Unspecified mood [affective] disorder: Secondary | ICD-10-CM | POA: Diagnosis not present

## 2024-05-10 DIAGNOSIS — F411 Generalized anxiety disorder: Secondary | ICD-10-CM | POA: Diagnosis not present

## 2024-05-10 DIAGNOSIS — F4312 Post-traumatic stress disorder, chronic: Secondary | ICD-10-CM | POA: Diagnosis not present

## 2024-05-20 DIAGNOSIS — R002 Palpitations: Secondary | ICD-10-CM | POA: Diagnosis not present

## 2024-05-23 ENCOUNTER — Ambulatory Visit (INDEPENDENT_AMBULATORY_CARE_PROVIDER_SITE_OTHER): Admitting: Pediatrics

## 2024-05-23 ENCOUNTER — Encounter: Payer: Self-pay | Admitting: Pediatrics

## 2024-05-23 VITALS — BP 127/79 | HR 73 | Temp 98.2°F | Resp 16 | Ht 62.99 in

## 2024-05-23 DIAGNOSIS — E039 Hypothyroidism, unspecified: Secondary | ICD-10-CM

## 2024-05-23 DIAGNOSIS — F4310 Post-traumatic stress disorder, unspecified: Secondary | ICD-10-CM | POA: Diagnosis not present

## 2024-05-23 DIAGNOSIS — G4700 Insomnia, unspecified: Secondary | ICD-10-CM | POA: Diagnosis not present

## 2024-05-23 DIAGNOSIS — F332 Major depressive disorder, recurrent severe without psychotic features: Secondary | ICD-10-CM

## 2024-05-23 MED ORDER — MIRTAZAPINE 15 MG PO TABS: 15.0000 mg | ORAL_TABLET | Freq: Every day | ORAL | 2 refills | Status: AC

## 2024-05-23 MED ORDER — OXCARBAZEPINE 300 MG PO TABS
ORAL_TABLET | ORAL | 2 refills | Status: DC
Start: 2024-05-23 — End: 2024-06-01

## 2024-05-23 NOTE — Progress Notes (Signed)
 Office Visit  BP 127/79 (BP Location: Right Arm, Patient Position: Sitting, Cuff Size: Large)   Pulse 73   Temp 98.2 F (36.8 C) (Oral)   Resp 16   Ht 5' 2.99 (1.6 m)   LMP 05/20/2024 (Approximate)   SpO2 99%   BMI 49.79 kg/m    Subjective:    Patient ID: Sharon Gibson, female    DOB: 06-Aug-1981, 43 y.o.   MRN: [id number]  HPI: Sharon Gibson is a 43 y.o. female  Chief Complaint  Patient presents with   Palpitations    Does feel it has improved. No longer having flushing or sweating.    Medication Problem    Oxcarbazepine  (TRILEPTAL ) 300 MG tablet is uncertain what she should be taking. Believes she should be taking two tabs twice daily but her current prescription says otherwise. Had to stop seeing psych.     Discussed the use of AI scribe software for clinical note transcription with the patient, who gave verbal consent to proceed.  History of Present Illness   Sharon Gibson is a 43 year old female who presents for follow-up on her thyroid  condition and medication management.  She has experienced symptoms such as shaking, sweating, and flushing, which have improved over the past week. These symptoms occurred while she was wearing a heart monitor, which she has since returned. Preliminary results from the heart monitor were normal. Her thyroid  levels have been abnormal recently, which is different from her historical levels.  She is currently taking Trileptal , which was verbally instructed by her psychiatrist to be taken twice a day, but the bottle indicates two at night. She recalls the plan was to increase the dose gradually. She is currently taking two pills at night and feels her medication is not well-managed. She has had issues with her psychiatrist, leading to a change in her care provider. She has been on sertraline  200 mg, mirtazapine  15 mg, and occasionally uses gabapentin  for sleep. She was previously on Klonopin  but stopped due to weight concerns. She  has been managing without it but reports ongoing panic attacks.  She has a history of eating disorders and has been in treatment multiple times. She reports recent triggers and a resurgence of her eating disorder behaviors. She has been in various treatment programs, including [facility], which she found somewhat beneficial. She is considering telehealth options for psychiatric care to avoid weight-related discussions that have been triggering in past in-person visits.      Relevant past medical, surgical, family and social history reviewed and updated as indicated. Interim medical history since our last visit reviewed. Allergies and medications reviewed and updated.  ROS per HPI unless specifically indicated above     Objective:    BP 127/79 (BP Location: Right Arm, Patient Position: Sitting, Cuff Size: Large)   Pulse 73   Temp 98.2 F (36.8 C) (Oral)   Resp 16   Ht 5' 2.99 (1.6 m)   LMP 05/20/2024 (Approximate)   SpO2 99%   BMI 49.79 kg/m   Wt Readings from Last 3 Encounters:  02/25/24 281 lb (127.5 kg)  09/08/23 279 lb (126.6 kg)  08/23/23 271 lb (122.9 kg)     Physical Exam Constitutional:      Appearance: Normal appearance.  Pulmonary:     Effort: Pulmonary effort is normal.  Musculoskeletal:        General: Normal range of motion.  Skin:    Comments: Normal skin color  Neurological:     General:  No focal deficit present.     Mental Status: She is alert. Mental status is at baseline.  Psychiatric:        Mood and Affect: Mood normal.        Behavior: Behavior normal.        Thought Content: Thought content normal.         05/02/2024    4:33 PM 11/24/2023   10:35 AM 11/05/2023    8:36 AM 11/01/2023    3:50 PM 10/21/2023    3:57 PM  Depression screen PHQ 2/9  Decreased Interest 3 2 3 3 3   Down, Depressed, Hopeless 3 2 3 3 3   PHQ - 2 Score 6 4 6 6 6   Altered sleeping 1 3 3 3 3   Tired, decreased energy 3 3 3 3 3   Change in appetite 3 3 3 3 3   Feeling bad or  failure about yourself  2 2 3 3 3   Trouble concentrating 3 2 3 3 3   Moving slowly or fidgety/restless 3 3 3 3 3   Suicidal thoughts 3 2 3 3 3   PHQ-9 Score 24 22 27 27 27   Difficult doing work/chores Extremely dIfficult Very difficult Extremely dIfficult Extremely dIfficult Extremely dIfficult       05/02/2024    4:33 PM 11/24/2023   10:35 AM 11/05/2023    8:36 AM 11/01/2023    3:50 PM  GAD 7 : Generalized Anxiety Score  Nervous, Anxious, on Edge 3 3 3 3   Control/stop worrying 3 3 3 3   Worry too much - different things 3 3 3 3   Trouble relaxing 3 3 3 3   Restless 3 3 3 3   Easily annoyed or irritable 3 3 3 3   Afraid - awful might happen 3 3 3 3   Total GAD 7 Score 21 21 21 21   Anxiety Difficulty Extremely difficult Extremely difficult Extremely difficult Extremely difficult       Assessment & Plan:  Assessment & Plan   Acquired hypothyroidism Assessment & Plan: Thyroid  function tests showed abnormal results with borderline normal T4. Likely explanation for hot flashes and palpitations. Preliminary holter read wnl. Sx resolved since dose increase. - Repeat thyroid  function tests to assess trend. - Consider endocrinologist referral if thyroid  function remains abnormal.  Orders: -     Thyroid  Panel With TSH  Severe episode of recurrent major depressive disorder, without psychotic features (HCC) Assessment & Plan: Managed with sertraline , mirtazapine , and Trileptal . Medication management changed after a negative experience with a psychiatrist. Trileptal  dosing is being adjusted. - Increase Trileptal  to 1 tablet in the morning and 2 tablets at night. - Send prescription for Trileptal  and mirtazapine .  Orders: -     Mirtazapine ; Take 1 tablet (15 mg total) by mouth at bedtime.  Dispense: 30 tablet; Refill: 2 -     OXcarbazepine ; One in the morning and two at night  Dispense: 90 tablet; Refill: 2  PTSD (post-traumatic stress disorder) Assessment & Plan: Panic attacks persist, possibly  due to recent medication changes. She avoids Klonopin  due to past inappropriate comments from a psychiatrist. Hydroxyzine  is ineffective.  Orders: -     Mirtazapine ; Take 1 tablet (15 mg total) by mouth at bedtime.  Dispense: 30 tablet; Refill: 2 -     OXcarbazepine ; One in the morning and two at night  Dispense: 90 tablet; Refill: 2  Insomnia, unspecified type Assessment & Plan: Managed with mirtazapine  and gabapentin  as needed. She reports improved sleep with current regimen. - Continue  current regimen of mirtazapine  and gabapentin  as needed.      Follow up plan: Return in about 1 week (around 05/30/2024).  Hadassah SHAUNNA Nett, MD

## 2024-05-23 NOTE — Patient Instructions (Signed)
 Trileptal  dose increase take one in the morning two at night  Continue everything else the same for now

## 2024-05-24 ENCOUNTER — Other Ambulatory Visit: Payer: Self-pay | Admitting: Pediatrics

## 2024-05-24 ENCOUNTER — Ambulatory Visit: Payer: Self-pay | Admitting: Pediatrics

## 2024-05-24 DIAGNOSIS — E039 Hypothyroidism, unspecified: Secondary | ICD-10-CM

## 2024-05-24 LAB — THYROID PANEL WITH TSH
Free Thyroxine Index: 1.2 (ref 1.2–4.9)
T3 Uptake Ratio: 20 % — ABNORMAL LOW (ref 24–39)
T4, Total: 5.9 ug/dL (ref 4.5–12.0)
TSH: 6.11 u[IU]/mL — ABNORMAL HIGH (ref 0.450–4.500)

## 2024-05-24 MED ORDER — LEVOTHYROXINE SODIUM 175 MCG PO TABS: 175.0000 ug | ORAL_TABLET | Freq: Every day | ORAL | 2 refills | Status: DC

## 2024-05-24 NOTE — Progress Notes (Signed)
 Sent synthroid  175mcg based on most recent results.  Hadassah SHAUNNA Nett, MD

## 2024-05-30 ENCOUNTER — Encounter: Payer: Self-pay | Admitting: Pediatrics

## 2024-05-30 DIAGNOSIS — R002 Palpitations: Secondary | ICD-10-CM

## 2024-05-30 NOTE — Assessment & Plan Note (Signed)
 Panic attacks persist, possibly due to recent medication changes. She avoids Klonopin  due to past inappropriate comments from a psychiatrist. Hydroxyzine  is ineffective.

## 2024-05-30 NOTE — Assessment & Plan Note (Signed)
 Managed with mirtazapine  and gabapentin  as needed. She reports improved sleep with current regimen. - Continue current regimen of mirtazapine  and gabapentin  as needed.

## 2024-05-30 NOTE — Assessment & Plan Note (Signed)
 Managed with sertraline , mirtazapine , and Trileptal . Medication management changed after a negative experience with a psychiatrist. Trileptal  dosing is being adjusted. - Increase Trileptal  to 1 tablet in the morning and 2 tablets at night. - Send prescription for Trileptal  and mirtazapine .

## 2024-05-30 NOTE — Assessment & Plan Note (Addendum)
 Thyroid  function tests showed abnormal results with borderline normal T4. Likely explanation for hot flashes and palpitations. Preliminary holter read wnl. Sx resolved since dose increase. - Repeat thyroid  function tests to assess trend. - Consider endocrinologist referral if thyroid  function remains abnormal.

## 2024-06-01 ENCOUNTER — Ambulatory Visit: Admitting: Family Medicine

## 2024-06-01 ENCOUNTER — Encounter: Payer: Self-pay | Admitting: Family Medicine

## 2024-06-01 VITALS — BP 114/71 | HR 69

## 2024-06-01 DIAGNOSIS — F4310 Post-traumatic stress disorder, unspecified: Secondary | ICD-10-CM | POA: Diagnosis not present

## 2024-06-01 DIAGNOSIS — F411 Generalized anxiety disorder: Secondary | ICD-10-CM

## 2024-06-01 DIAGNOSIS — R5382 Chronic fatigue, unspecified: Secondary | ICD-10-CM

## 2024-06-01 DIAGNOSIS — F332 Major depressive disorder, recurrent severe without psychotic features: Secondary | ICD-10-CM

## 2024-06-01 MED ORDER — OXCARBAZEPINE 300 MG PO TABS: ORAL_TABLET | ORAL | Status: DC

## 2024-06-01 MED ORDER — CLONAZEPAM 1 MG PO TABS: 1.0000 mg | ORAL_TABLET | Freq: Every day | ORAL | 0 refills | Status: AC | PRN

## 2024-06-01 NOTE — Progress Notes (Signed)
 BP 114/71   Pulse 69   LMP 05/20/2024 (Approximate)   SpO2 96%    Subjective:    Patient ID: Sharon Gibson, female    DOB: 12-24-1980, 43 y.o.   MRN: [id number]  HPI: Sharon Gibson is a 43 y.o. female  Chief Complaint  Patient presents with   Depression   Has not started the AM dose of her trileptal  yet. Not noticing a big benefit from it at this time.  Did not have a good experience with her psychiatrist. Had issues with wanting to do genetic testing, but wasn't able to afford it. She was having issues with having to be weighed and having it on her AVS.   Around the time she got in with psych she had been seeing [facility] and she had been on lithium . Never got up to a therapeutic dose. Didn't have any side effects. Did better on the lithium    Had been having panic attacks. Has been doing therapy, but it is quite expensive with her new insurance which has been causing issues. She was noting that she had she was having issues with not being able to sleep- waking up in the middle of the night, has not been able to focus and having issues with panic. She was taking the remeron  and the trileptal  and she has been able to sleep with that. She tried hydroxyzine  and it only made her tired without helping her to sleep. She notes that she is sleeping, but she's extremely irritable and has been losing her cool. She has not been feeling like herself- this is slightly better, but not under good control.   ANXIETY/DEPRESSION Duration: chronic Status:uncontrolled Anxious mood: yes  Excessive worrying: yes Irritability: yes  Sweating: yes Nausea: no Palpitations:yes Hyperventilation: no Panic attacks: yes Agoraphobia: no  Obscessions/compulsions: yes Depressed mood: yes    06/01/2024    4:21 PM 05/02/2024    4:33 PM 11/24/2023   10:35 AM 11/05/2023    8:36 AM 11/01/2023    3:50 PM  Depression screen PHQ 2/9  Decreased Interest 3 3 2 3 3   Down, Depressed, Hopeless 3 3 2 3 3    PHQ - 2 Score 6 6 4 6 6   Altered sleeping 3 1 3 3 3   Tired, decreased energy 3 3 3 3 3   Change in appetite 3 3 3 3 3   Feeling bad or failure about yourself  3 2 2 3 3   Trouble concentrating 3 3 2 3 3   Moving slowly or fidgety/restless 3 3 3 3 3   Suicidal thoughts 3 3 2 3 3   PHQ-9 Score 27 24 22 27 27   Difficult doing work/chores Extremely dIfficult Extremely dIfficult Very difficult Extremely dIfficult Extremely dIfficult      06/01/2024    4:21 PM 05/02/2024    4:33 PM 11/24/2023   10:35 AM 11/05/2023    8:36 AM  GAD 7 : Generalized Anxiety Score  Nervous, Anxious, on Edge 3 3 3 3   Control/stop worrying 3 3 3 3   Worry too much - different things 3 3 3 3   Trouble relaxing 3 3 3 3   Restless 3 3 3 3   Easily annoyed or irritable 3 3 3 3   Afraid - awful might happen 3 3 3 3   Total GAD 7 Score 21 21 21 21   Anxiety Difficulty Extremely difficult Extremely difficult Extremely difficult Extremely difficult   Anhedonia: no Weight changes: no Insomnia: yes hard to stay asleep  Hypersomnia: yes Fatigue/loss of energy:  yes Feelings of worthlessness: yes Feelings of guilt: yes Impaired concentration/indecisiveness: yes Suicidal ideations: yes  Crying spells: yes Recent Stressors/Life Changes: yes  ???? SLEEP APNEA Sleep apnea status: unsure Duration: chronic Last sleep study: never Treatments attempted: medication Wakes feeling refreshed:  no Daytime hypersomnolence:  yes Fatigue:  yes Insomnia:  yes Good sleep hygiene:  yes Difficulty falling asleep:  no Difficulty staying asleep:  yes Snoring bothers bed partner:  no Observed apnea by bed partner: no Obesity:  yes Hypertension: no  Pulmonary hypertension:  no Coronary artery disease:  no  Relevant past medical, surgical, family and social history reviewed and updated as indicated. Interim medical history since our last visit reviewed. Allergies and medications reviewed and updated.  Review of Systems  Constitutional:  Negative.   Respiratory: Negative.    Cardiovascular: Negative.   Musculoskeletal: Negative.   Skin: Negative.   Neurological: Negative.   Psychiatric/Behavioral:  Positive for dysphoric mood. Negative for agitation, behavioral problems, confusion, decreased concentration, hallucinations, self-injury, sleep disturbance and suicidal ideas. The patient is nervous/anxious. The patient is not hyperactive.     Per HPI unless specifically indicated above     Objective:    BP 114/71   Pulse 69   LMP 05/20/2024 (Approximate)   SpO2 96%   Wt Readings from Last 3 Encounters:  02/25/24 281 lb (127.5 kg)  09/08/23 279 lb (126.6 kg)  08/23/23 271 lb (122.9 kg)    Physical Exam Vitals and nursing note reviewed.  Constitutional:      General: She is not in acute distress.    Appearance: Normal appearance. She is well-developed.  HENT:     Head: Normocephalic and atraumatic.     Right Ear: Hearing and external ear normal.     Left Ear: Hearing and external ear normal.     Nose: Nose normal.     Mouth/Throat:     Mouth: Mucous membranes are moist.     Pharynx: Oropharynx is clear.  Eyes:     General: Lids are normal. No scleral icterus.       Right eye: No discharge.        Left eye: No discharge.     Conjunctiva/sclera: Conjunctivae normal.  Pulmonary:     Effort: Pulmonary effort is normal. No respiratory distress.  Musculoskeletal:        General: Normal range of motion.  Skin:    Coloration: Skin is not jaundiced or pale.     Findings: No bruising, erythema, lesion or rash.  Neurological:     General: No focal deficit present.     Mental Status: She is alert and oriented to person, place, and time. Mental status is at baseline.  Psychiatric:        Mood and Affect: Mood normal.        Speech: Speech normal.        Behavior: Behavior normal.        Thought Content: Thought content normal.        Judgment: Judgment normal.     Results for orders placed or performed in  visit on 05/23/24  Thyroid  Panel With TSH   Collection Time: 05/23/24  3:14 PM  Result Value Ref Range   TSH 6.110 (H) 0.450 - 4.500 uIU/mL   T4, Total 5.9 4.5 - 12.0 ug/dL   T3 Uptake Ratio 20 (L) 24 - 39 %   Free Thyroxine Index 1.2 1.2 - 4.9      Assessment & Plan:  Problem List Items Addressed This Visit       Other   PTSD (post-traumatic stress disorder)   Given brain fog, we will wean off her tripleptal and see how she does. If brain fog and increased appetite resolved we may be able to increase remeron  to help with her anxiety. If not any better of the trileptal , may need to stop remeron . Refill of clonazepam  given- not to be taken at night. Will take her gabapentin  at bedtime for sleep. May want to go back on lithium  if need additional mood stabilization. Continue to monitor. Call with any concerns. Follow up as scheduled.       Relevant Medications   Oxcarbazepine  (TRILEPTAL ) 300 MG tablet   Severe episode of recurrent major depressive disorder, without psychotic features (HCC) - Primary   See discussion under PTSD.       Relevant Medications   Oxcarbazepine  (TRILEPTAL ) 300 MG tablet   Generalized anxiety disorder   See discussion under PTSD.       Other Visit Diagnoses       Chronic fatigue       Will check sleep study. Await results. Treat as needed.   Relevant Orders   Ambulatory referral to Sleep Studies        Follow up plan: Return for As scheduled.  >1 hour spent with patient today

## 2024-06-04 ENCOUNTER — Encounter: Payer: Self-pay | Admitting: Family Medicine

## 2024-06-04 NOTE — Assessment & Plan Note (Signed)
See discussion under PTSD 

## 2024-06-04 NOTE — Assessment & Plan Note (Signed)
 Given brain fog, we will wean off her tripleptal and see how she does. If brain fog and increased appetite resolved we may be able to increase remeron  to help with her anxiety. If not any better of the trileptal , may need to stop remeron . Refill of clonazepam  given- not to be taken at night. Will take her gabapentin  at bedtime for sleep. May want to go back on lithium  if need additional mood stabilization. Continue to monitor. Call with any concerns. Follow up as scheduled.

## 2024-06-15 ENCOUNTER — Ambulatory Visit: Admitting: Family Medicine

## 2024-06-16 ENCOUNTER — Other Ambulatory Visit: Payer: Self-pay | Admitting: Family Medicine

## 2024-06-16 NOTE — Telephone Encounter (Signed)
 Requested Prescriptions  Pending Prescriptions Disp Refills   gabapentin  (NEURONTIN ) 300 MG capsule [Pharmacy Med Name: GABAPENTIN  300 MG CAPSULE] 180 capsule 1    Sig: TAKE 2 CAPSULES BY MOUTH AT BEDTIME.     Neurology: Anticonvulsants - gabapentin  Passed - 06/16/2024  2:26 PM      Passed - Cr in normal range and within 360 days    Creatinine, Ser  Date Value Ref Range Status  05/02/2024 0.70 0.57 - 1.00 mg/dL Final         Passed - Completed PHQ-2 or PHQ-9 in the last 360 days      Passed - Valid encounter within last 12 months    Recent Outpatient Visits           2 weeks ago Severe episode of recurrent major depressive disorder, without psychotic features (HCC)   [place] [facility] [place], Megan P, DO   3 weeks ago Acquired hypothyroidism   [place] [facility] Hadassah SQUIBB, MD   1 month ago Facial flushing   [place] [facility] Hadassah SQUIBB, MD   6 months ago Severe episode of recurrent major depressive disorder, without psychotic features [facility])   [place] [facility] [place], Megan P, DO   6 months ago Severe episode of recurrent major depressive disorder, without psychotic features [facility])   [place] [facility] Vicci Duwaine SQUIBB, DO       Future Appointments             In 2 weeks Babara Call, MD [facility]

## 2024-06-19 DIAGNOSIS — F4310 Post-traumatic stress disorder, unspecified: Secondary | ICD-10-CM | POA: Diagnosis not present

## 2024-06-26 ENCOUNTER — Ambulatory Visit: Admitting: Family Medicine

## 2024-06-26 DIAGNOSIS — F4310 Post-traumatic stress disorder, unspecified: Secondary | ICD-10-CM | POA: Diagnosis not present

## 2024-06-27 ENCOUNTER — Ambulatory Visit: Admitting: Family Medicine

## 2024-06-28 ENCOUNTER — Inpatient Hospital Stay: Attending: Family Medicine

## 2024-06-28 DIAGNOSIS — F332 Major depressive disorder, recurrent severe without psychotic features: Secondary | ICD-10-CM | POA: Diagnosis not present

## 2024-06-28 DIAGNOSIS — F411 Generalized anxiety disorder: Secondary | ICD-10-CM | POA: Diagnosis not present

## 2024-06-29 ENCOUNTER — Ambulatory Visit: Payer: Self-pay | Admitting: *Deleted

## 2024-06-29 ENCOUNTER — Ambulatory Visit: Payer: Self-pay

## 2024-06-29 ENCOUNTER — Encounter: Payer: Self-pay | Admitting: Family Medicine

## 2024-06-29 ENCOUNTER — Ambulatory Visit (INDEPENDENT_AMBULATORY_CARE_PROVIDER_SITE_OTHER): Admitting: Family Medicine

## 2024-06-29 VITALS — BP 91/62 | HR 80 | Ht 63.0 in

## 2024-06-29 DIAGNOSIS — R45851 Suicidal ideations: Secondary | ICD-10-CM

## 2024-06-29 DIAGNOSIS — F411 Generalized anxiety disorder: Secondary | ICD-10-CM

## 2024-06-29 DIAGNOSIS — Z23 Encounter for immunization: Secondary | ICD-10-CM | POA: Diagnosis not present

## 2024-06-29 DIAGNOSIS — F332 Major depressive disorder, recurrent severe without psychotic features: Secondary | ICD-10-CM | POA: Diagnosis not present

## 2024-06-29 NOTE — Telephone Encounter (Signed)
 Patient has been scheduled

## 2024-06-29 NOTE — Telephone Encounter (Signed)
 Patient has already called once today-mom called for patient with concerned of increased anxiety and PTSD symptoms. Mom is thinking patient is needing to be written out of work and put on leave. Patient was seen by a new psychiatrist yesterday and medications were adjusted. Both Patient and mom are asking for an appointment today with Dr. Vicci. Needing follow up call.    FYI Only or Action Required?: Action required by provider: request for appointment.  Patient was last seen in primary care on 06/01/2024 by Vicci Duwaine SQUIBB, DO.  Called Nurse Triage reporting Anxiety.  Symptoms began a couple of days ago.  Interventions attempted: Prescription medications: see MAR and Rest, hydration, or home remedies.  Symptoms are: gradually worsening.  Triage Disposition: See Physician Within 24 Hours  Patient/caregiver understands and will follow disposition?: No, wishes to speak with PCP no open appointments-mom states Dr. Vicci works with patient well.   Copied from CRM #[id number]. Topic: Clinical - Red Word Triage >> Jun 29, 2024 11:17 AM Winona R wrote: Pt mom calling to request appointment for pt who she believe is having a anxiety Crisis Pt suffers from PTSD. Pt is at work but mom thinks she needs to be pulled out of work and on a leave as her Anxiety and PTSD is at a peak Reason for Disposition  Patient sounds very upset or troubled to the triager  Answer Assessment - Initial Assessment Questions 1. CONCERN: Did anything happen that prompted you to call today?      Increased Anxiety and PTSD today 2. ANXIETY SYMPTOMS: Can you describe how you (your loved one; patient) have been feeling? (e.g., tense, restless, panicky, anxious, keyed up, overwhelmed, sense of impending doom).      Anxious, overwhelmed 3. ONSET: How long have you been feeling this way? (e.g., hours, days, weeks)     Started a few days ago 4. SEVERITY: How would you rate the level of anxiety? (e.g., 0 - 10; or mild,  moderate, severe).     Moderate-severe 5. FUNCTIONAL IMPAIRMENT: How have these feelings affected your ability to do daily activities? Have you had more difficulty than usual doing your normal daily activities? (e.g., getting better, same, worse; self-care, school, work, interactions)     Increased difficulty getting stuff done today 6. HISTORY: Have you felt this way before? Have you ever been diagnosed with an anxiety problem in the past? (e.g., generalized anxiety disorder, panic attacks, PTSD). If Yes, ask: How was this problem treated? (e.g., medicines, counseling, etc.)     yes 7. RISK OF HARM - SUICIDAL IDEATION: Do you ever have thoughts of hurting or killing yourself? If Yes, ask:  Do you have these feelings now? Do you have a plan on how you would do this?     no 8. TREATMENT:  What has been done so far to treat this anxiety? (e.g., medicines, relaxation strategies). What has helped?     Medications, and therapy 9. THERAPIST: Do you have a counselor or therapist? If Yes, ask: What is their name?     Mliss  [phone] 10. POTENTIAL TRIGGERS: Do you drink caffeinated beverages (e.g., coffee, colas, teas), and how much daily? Do you drink alcohol or use any drugs? Have you started any new medicines recently?       Saw a new psychiatrist yesterday and medication was changed but hasn't started it yet  11. PATIENT SUPPORT: Who is with you now? Who do you live with? Do you have family or friends who  you can talk to?        Mom is calling for patient who is at work currently 42. OTHER SYMPTOMS: Do you have any other symptoms? (e.g., feeling depressed, trouble concentrating, trouble sleeping, trouble breathing, palpitations or fast heartbeat, chest pain, sweating, nausea, or diarrhea)       no  Protocols used: Anxiety and Panic Attack-A-AH

## 2024-06-29 NOTE — Assessment & Plan Note (Signed)
 Seeing psych, but not doing well. Having thoughts of overdosing and her dog, who usually is what keeps her here is not a protective factor at this time. Discussed with her and her Mom- safest place is inpatient for time being. Willing to go voluntarily. Will try to arrange ASAP.

## 2024-06-29 NOTE — Telephone Encounter (Signed)
 FYI Only or Action Required?: Action required by provider: request for appointment.  Patient was last seen in primary care on 06/01/2024 by Vicci Duwaine SQUIBB, DO.  Called Nurse Triage reporting Panic Attack.  Symptoms began Monday  Interventions attempted: Prescription medications: prescription medications and Rest, hydration, or home remedies.  Symptoms are: gradually worsening.  Triage Disposition: See Physician Within 24 Hours  Patient/caregiver understands and will follow disposition?: No open appointment- patient states she can usually get worked in by provider   Reason for Disposition . Depression symptoms (sadness, hopelessness, decreased energy) interfere with work or school . Patient sounds very upset or troubled to the triager  Answer Assessment - Initial Assessment Questions 1. CONCERN: Did anything happen that prompted you to call today?      Worsening panic attacks 2. ANXIETY SYMPTOMS: Can you describe how you (your loved one; patient) have been feeling? (e.g., tense, restless, panicky, anxious, keyed up, overwhelmed, sense of impending doom).      Government shutdown effects patient job- indirectly 3. ONSET: How long have you been feeling this way? (e.g., hours, days, weeks)     Steady increase- constant panic since Monday evening 4. SEVERITY: How would you rate the level of anxiety? (e.g., 0 - 10; or mild, moderate, severe).     severe 5. FUNCTIONAL IMPAIRMENT: How have these feelings affected your ability to do daily activities? Have you had more difficulty than usual doing your normal daily activities? (e.g., getting better, same, worse; self-care, school, work, interactions)     Not able to leave house Tuesday- patient is at work today- but having difficult time 6. HISTORY: Have you felt this way before? Have you ever been diagnosed with an anxiety problem in the past? (e.g., generalized anxiety disorder, panic attacks, PTSD). If Yes, ask: How was this  problem treated? (e.g., medicines, counseling, etc.)     Yes- PTSD 7. RISK OF HARM - SUICIDAL IDEATION: Do you ever have thoughts of hurting or killing yourself? If Yes, ask:  Do you have these feelings now? Do you have a plan on how you would do this?     Yes- patient has had thoughts but no plan in place, has feelings now 8. TREATMENT:  What has been done so far to treat this anxiety? (e.g., medicines, relaxation strategies). What has helped?     All techniques- not helping 9. THERAPIST: Do you have a counselor or therapist? If Yes, ask: What is their name?     Yes- has not reached out to therapist, did have appointment last night with psychiatrist and has discussed medication changes   10. POTENTIAL TRIGGERS: Do you drink caffeinated beverages (e.g., coffee, colas, teas), and how much daily? Do you drink alcohol or use any drugs? Have you started any new medicines recently?       Aware of above- patient had appointment last night- expecting medication changes, Patient will call therapist after she disconnects this call- she states they are good at seeing her for emergency appointments. 11. PATIENT SUPPORT: Who is with you now? Who do you live with? Do you have family or friends who you can talk to?        Parents- they are help 12. OTHER SYMPTOMS: Do you have any other symptoms? (e.g., feeling depressed, trouble concentrating, trouble sleeping, trouble breathing, palpitations or fast heartbeat, chest pain, sweating, nausea, or diarrhea)       Chest pain- normal panic symptoms, nausea, diarrhea  Protocols used: Anxiety and Panic Attack-A-AH, Suicide Concerns-A-AH  Copied from CRM #[id number]. Topic: Clinical - Red Word Triage >> Jun 29, 2024  9:16 AM Treva T wrote: [facility] that prompted transfer to Nurse Triage: Patient calling states she is having increased panic attacks, and requesting to evaluated as soon as possible.

## 2024-06-29 NOTE — Telephone Encounter (Signed)
 Patient scheduled for today

## 2024-06-29 NOTE — Progress Notes (Signed)
 BP 91/62   Pulse 80   Ht 5' 3 (1.6 m)   SpO2 98%   BMI 49.78 kg/m    Subjective:    Patient ID: Sharon Gibson, female    DOB: 1981/05/19, 43 y.o.   MRN: [id number]  HPI: Sharon Gibson is a 43 y.o. female  Chief Complaint  Patient presents with   Depression   Panic Attack    Onset since Monday. SOB, body aches, chest tightness.    ANXIETY/DEPRESSION- Has not been doing well. Has been having panic attacks. She is having less brain fog Duration:exacerbated Anxious mood: yes  Excessive worrying: yes Irritability: yes  Sweating: no Nausea: yes Palpitations:yes Hyperventilation: no Panic attacks: yes Agoraphobia: no  Obscessions/compulsions: yes Depressed mood: yes    06/29/2024    2:48 PM 06/01/2024    4:21 PM 05/02/2024    4:33 PM 11/24/2023   10:35 AM 11/05/2023    8:36 AM  Depression screen PHQ 2/9  Decreased Interest 3 3 3 2 3   Down, Depressed, Hopeless 3 3 3 2 3   PHQ - 2 Score 6 6 6 4 6   Altered sleeping 3 3 1 3 3   Tired, decreased energy 3 3 3 3 3   Change in appetite 3 3 3 3 3   Feeling bad or failure about yourself  3 3 2 2 3   Trouble concentrating 3 3 3 2 3   Moving slowly or fidgety/restless 3 3 3 3 3   Suicidal thoughts 3 3 3 2 3   PHQ-9 Score 27 27 24 22 27   Difficult doing work/chores Extremely dIfficult Extremely dIfficult Extremely dIfficult Very difficult Extremely dIfficult   Anhedonia: yes Weight changes: no Insomnia: yes hard to fall asleep  Hypersomnia: yes Fatigue/loss of energy: yes Feelings of worthlessness: yes Feelings of guilt: yes Impaired concentration/indecisiveness: yes Suicidal ideations: yes- active plan to overdose  Crying spells: yes Recent Stressors/Life Changes: yes   Relationship problems: no   Family stress: no     Financial stress: no    Job stress: no    Recent death/loss: no  Relevant past medical, surgical, family and social history reviewed and updated as indicated. Interim medical history since our last  visit reviewed. Allergies and medications reviewed and updated.  Review of Systems  Constitutional: Negative.   Respiratory: Negative.    Cardiovascular: Negative.   Musculoskeletal:  Positive for myalgias. Negative for arthralgias, back pain, gait problem, joint swelling, neck pain and neck stiffness.  Skin: Negative.   Psychiatric/Behavioral:  Positive for decreased concentration, dysphoric mood, sleep disturbance and suicidal ideas. Negative for agitation, behavioral problems, confusion, hallucinations and self-injury. The patient is nervous/anxious. The patient is not hyperactive.     Per HPI unless specifically indicated above     Objective:    BP 91/62   Pulse 80   Ht 5' 3 (1.6 m)   SpO2 98%   BMI 49.78 kg/m   Wt Readings from Last 3 Encounters:  02/25/24 281 lb (127.5 kg)  09/08/23 279 lb (126.6 kg)  08/23/23 271 lb (122.9 kg)    Physical Exam Vitals and nursing note reviewed.  Constitutional:      General: She is not in acute distress.    Appearance: Normal appearance. She is well-developed.  HENT:     Head: Normocephalic and atraumatic.     Right Ear: Hearing and external ear normal.     Left Ear: Hearing and external ear normal.     Nose: Nose normal.     Mouth/Throat:  Mouth: Mucous membranes are moist.     Pharynx: Oropharynx is clear.  Eyes:     General: Lids are normal. No scleral icterus.       Right eye: No discharge.        Left eye: No discharge.     Conjunctiva/sclera: Conjunctivae normal.  Pulmonary:     Effort: Pulmonary effort is normal. No respiratory distress.  Musculoskeletal:        General: Normal range of motion.  Skin:    Coloration: Skin is not jaundiced or pale.     Findings: No bruising, erythema, lesion or rash.  Neurological:     General: No focal deficit present.     Mental Status: She is alert and oriented to person, place, and time. Mental status is at baseline.  Psychiatric:        Mood and Affect: Mood normal.         Speech: Speech normal.        Behavior: Behavior normal.        Thought Content: Thought content normal.        Judgment: Judgment normal.     Results for orders placed or performed in visit on 05/23/24  Thyroid  Panel With TSH   Collection Time: 05/23/24  3:14 PM  Result Value Ref Range   TSH 6.110 (H) 0.450 - 4.500 uIU/mL   T4, Total 5.9 4.5 - 12.0 ug/dL   T3 Uptake Ratio 20 (L) 24 - 39 %   Free Thyroxine Index 1.2 1.2 - 4.9      Assessment & Plan:   Problem List Items Addressed This Visit       Other   Severe episode of recurrent major depressive disorder, without psychotic features (HCC) - Primary   Seeing psych, but not doing well. Having thoughts of overdosing and her dog, who usually is what keeps her here is not a protective factor at this time. Discussed with her and her Mom- safest place is inpatient for time being. Willing to go voluntarily. Will try to arrange ASAP.       Relevant Medications   desvenlafaxine (PRISTIQ) 50 MG 24 hr tablet   Other Relevant Orders   Pfizer Comirnaty Covid -19 Vaccine 82yrs and older   Generalized anxiety disorder   Seeing psych, but not doing well. Having thoughts of overdosing and her dog, who usually is what keeps her here is not a protective factor at this time. Discussed with her and her Mom- safest place is inpatient for time being. Willing to go voluntarily. Will try to arrange ASAP.       Relevant Medications   desvenlafaxine (PRISTIQ) 50 MG 24 hr tablet   Other Visit Diagnoses       Feeling suicidal         Needs flu shot       Relevant Orders   Flu vaccine trivalent PF, 6mos and older(Flulaval,Afluria,Fluarix,Fluzone)        Follow up plan: Return for Pending hospitalization.

## 2024-07-03 ENCOUNTER — Telehealth: Payer: Self-pay | Admitting: Oncology

## 2024-07-03 DIAGNOSIS — F4310 Post-traumatic stress disorder, unspecified: Secondary | ICD-10-CM | POA: Diagnosis not present

## 2024-07-03 NOTE — Telephone Encounter (Signed)
 Pt scheduled for virtual visit 10/9. And no showed labs prior 10/1.  I called and left vm for pt to call back to r/s labs prior to the virtual visit

## 2024-07-05 ENCOUNTER — Telehealth: Payer: Self-pay | Admitting: Oncology

## 2024-07-05 NOTE — Telephone Encounter (Signed)
 I canceled mychart visit for 10/9 as pt did not show for labs prior. I left a vm stating virtual appt is canceled due to not having labs prior. Gave scheduling phone number to call back if she wants to r/s appts

## 2024-07-06 ENCOUNTER — Inpatient Hospital Stay: Admitting: Oncology

## 2024-07-13 DIAGNOSIS — F411 Generalized anxiety disorder: Secondary | ICD-10-CM | POA: Diagnosis not present

## 2024-07-13 DIAGNOSIS — F332 Major depressive disorder, recurrent severe without psychotic features: Secondary | ICD-10-CM | POA: Diagnosis not present

## 2024-07-19 DIAGNOSIS — F4310 Post-traumatic stress disorder, unspecified: Secondary | ICD-10-CM | POA: Diagnosis not present

## 2024-07-24 DIAGNOSIS — F4310 Post-traumatic stress disorder, unspecified: Secondary | ICD-10-CM | POA: Diagnosis not present

## 2024-07-25 ENCOUNTER — Other Ambulatory Visit: Payer: Self-pay | Admitting: Family Medicine

## 2024-07-26 NOTE — Telephone Encounter (Signed)
 Requested medication (s) are due for refill today: yes  Requested medication (s) are on the active medication list: yes  Last refill:  11/24/23  Future visit scheduled: yes  Notes to clinic:  Unable to refill per protocol, cannot delegate.      Requested Prescriptions  Pending Prescriptions Disp Refills   tiZANidine  (ZANAFLEX ) 4 MG tablet [Pharmacy Med Name: TIZANIDINE  HCL 4 MG TABLET] 90 tablet 1    Sig: TAKE 1 TABLET BY MOUTH AT BEDTIME AS NEEDED FOR MUSCLE SPASMS.     Not Delegated - Cardiovascular:  Alpha-2 Agonists - tizanidine  Failed - 07/26/2024  1:03 PM      Failed - This refill cannot be delegated      Passed - Valid encounter within last 6 months    Recent Outpatient Visits           3 weeks ago Severe episode of recurrent major depressive disorder, without psychotic features (HCC)   [place] [facility] [place], Megan P, DO   1 month ago Severe episode of recurrent major depressive disorder, without psychotic features [facility])   [place] [facility] [place], Megan P, DO   2 months ago Acquired hypothyroidism   [place] [facility] Herold Hadassah SQUIBB, MD   2 months ago Facial flushing    [place] [facility] Herold Hadassah SQUIBB, MD   7 months ago Severe episode of recurrent major depressive disorder, without psychotic features [facility])    [place] [facility] [place], Megan P, DO

## 2024-07-31 DIAGNOSIS — F4310 Post-traumatic stress disorder, unspecified: Secondary | ICD-10-CM | POA: Diagnosis not present

## 2024-08-07 DIAGNOSIS — F4310 Post-traumatic stress disorder, unspecified: Secondary | ICD-10-CM | POA: Diagnosis not present

## 2024-08-16 DIAGNOSIS — F4310 Post-traumatic stress disorder, unspecified: Secondary | ICD-10-CM | POA: Diagnosis not present

## 2024-08-20 ENCOUNTER — Other Ambulatory Visit: Payer: Self-pay | Admitting: Family Medicine

## 2024-08-22 NOTE — Telephone Encounter (Signed)
 Requested medications are due for refill today.  Yes - a little soon  Requested medications are on the active medications list.  yes  Last refill. 07/27/2024 #30 0 rf  Future visit scheduled.   no  Notes to clinic.  Refill not delegated.    Requested Prescriptions  Pending Prescriptions Disp Refills   tiZANidine  (ZANAFLEX ) 4 MG tablet [Pharmacy Med Name: TIZANIDINE  HCL 4 MG TABLET] 90 tablet 1    Sig: TAKE 1 TABLET BY MOUTH AT BEDTIME AS NEEDED FOR MUSCLE SPASMS.     Not Delegated - Cardiovascular:  Alpha-2 Agonists - tizanidine  Failed - 08/22/2024  9:45 AM      Failed - This refill cannot be delegated      Passed - Valid encounter within last 6 months    Recent Outpatient Visits           1 month ago Severe episode of recurrent major depressive disorder, without psychotic features (HCC)   [place] [facility] [place], Megan P, DO   2 months ago Severe episode of recurrent major depressive disorder, without psychotic features [facility])   [place] [facility] [place], Megan P, DO   3 months ago Acquired hypothyroidism   [place] [facility] Herold Hadassah SQUIBB, MD   3 months ago Facial flushing   [place] [facility] Herold Hadassah SQUIBB, MD   8 months ago Severe episode of recurrent major depressive disorder, without psychotic features [facility])   [place] [facility] [place], Megan P, DO

## 2024-08-26 ENCOUNTER — Other Ambulatory Visit: Payer: Self-pay | Admitting: Pediatrics

## 2024-08-26 DIAGNOSIS — E039 Hypothyroidism, unspecified: Secondary | ICD-10-CM

## 2024-08-30 DIAGNOSIS — F4310 Post-traumatic stress disorder, unspecified: Secondary | ICD-10-CM | POA: Diagnosis not present

## 2024-08-30 NOTE — Telephone Encounter (Signed)
 Needs labs for 90-day supply

## 2024-08-30 NOTE — Telephone Encounter (Signed)
 Requested medication (s) are due for refill today: yes   Requested medication (s) are on the active medication list: yes   Last refill:  05/24/24 #30 2 refills  Future visit scheduled: no   Notes to clinic:   do you want to continue refills ? Requesting #90 day supply     Requested Prescriptions  Pending Prescriptions Disp Refills   levothyroxine  (SYNTHROID ) 175 MCG tablet [Pharmacy Med Name: LEVOTHYROXINE  175 MCG TABLET] 90 tablet     Sig: TAKE 1 TABLET BY MOUTH DAILY BEFORE BREAKFAST.     Endocrinology:  Hypothyroid Agents Failed - 08/30/2024 11:35 AM      Failed - TSH in normal range and within 360 days    TSH  Date Value Ref Range Status  05/23/2024 6.110 (H) 0.450 - 4.500 uIU/mL Final         Passed - Valid encounter within last 12 months    Recent Outpatient Visits           2 months ago Severe episode of recurrent major depressive disorder, without psychotic features (HCC)   [place] [facility] [place], Megan P, DO   3 months ago Severe episode of recurrent major depressive disorder, without psychotic features [place] [facility])   [facility] [place], Megan P, DO   3 months ago Acquired hypothyroidism   [place] [facility] Herold Hadassah SQUIBB, MD   4 months ago Facial flushing   [place] [facility] Herold Hadassah SQUIBB, MD   8 months ago Severe episode of recurrent major depressive disorder, without psychotic features [facility])   [place] [facility] [place], Megan P, DO

## 2024-09-06 DIAGNOSIS — F4310 Post-traumatic stress disorder, unspecified: Secondary | ICD-10-CM | POA: Diagnosis not present

## 2024-09-10 ENCOUNTER — Other Ambulatory Visit: Payer: Self-pay | Admitting: Medical Genetics

## 2024-09-11 DIAGNOSIS — F4310 Post-traumatic stress disorder, unspecified: Secondary | ICD-10-CM | POA: Diagnosis not present

## 2024-09-18 ENCOUNTER — Other Ambulatory Visit: Payer: Self-pay | Admitting: Family Medicine

## 2024-09-27 ENCOUNTER — Other Ambulatory Visit: Payer: Self-pay | Admitting: Family Medicine

## 2024-09-27 DIAGNOSIS — E039 Hypothyroidism, unspecified: Secondary | ICD-10-CM

## 2024-09-28 NOTE — Telephone Encounter (Signed)
 Requested medications are due for refill today.  yes  Requested medications are on the active medications list.  yes  Last refill. 08/30/2024 #30 0 rf  Future visit scheduled.   yes  Notes to clinic.  Abnormal labs.    Requested Prescriptions  Pending Prescriptions Disp Refills   levothyroxine  (SYNTHROID ) 175 MCG tablet [Pharmacy Med Name: LEVOTHYROXINE  175 MCG TABLET] 90 tablet 1    Sig: TAKE 1 TABLET BY MOUTH EVERY DAY BEFORE BREAKFAST     Endocrinology:  Hypothyroid Agents Failed - 09/28/2024 12:30 PM      Failed - TSH in normal range and within 360 days    TSH  Date Value Ref Range Status  05/23/2024 6.110 (H) 0.450 - 4.500 uIU/mL Final         Passed - Valid encounter within last 12 months    Recent Outpatient Visits           3 months ago Severe episode of recurrent major depressive disorder, without psychotic features (HCC)   [place] [facility] [place], Megan P, DO   3 months ago Severe episode of recurrent major depressive disorder, without psychotic features (HCC)   [place] [facility] [place], Megan P, DO   4 months ago Acquired hypothyroidism   [place] [facility] Herold Hadassah SQUIBB, MD   4 months ago Facial flushing   [place] [facility] Herold Hadassah SQUIBB, MD   9 months ago Severe episode of recurrent major depressive disorder, without psychotic features [facility])   [place] [facility] [place], Megan P, DO

## 2024-10-16 ENCOUNTER — Encounter: Payer: Self-pay | Admitting: Family Medicine

## 2024-10-16 ENCOUNTER — Ambulatory Visit: Admitting: Family Medicine

## 2024-10-16 VITALS — BP 133/67 | HR 76 | Temp 100.2°F | Ht 62.99 in

## 2024-10-16 DIAGNOSIS — F332 Major depressive disorder, recurrent severe without psychotic features: Secondary | ICD-10-CM

## 2024-10-16 DIAGNOSIS — R051 Acute cough: Secondary | ICD-10-CM

## 2024-10-16 DIAGNOSIS — Z789 Other specified health status: Secondary | ICD-10-CM | POA: Diagnosis not present

## 2024-10-16 DIAGNOSIS — E611 Iron deficiency: Secondary | ICD-10-CM | POA: Diagnosis not present

## 2024-10-16 DIAGNOSIS — E039 Hypothyroidism, unspecified: Secondary | ICD-10-CM | POA: Diagnosis not present

## 2024-10-16 DIAGNOSIS — Z1231 Encounter for screening mammogram for malignant neoplasm of breast: Secondary | ICD-10-CM | POA: Diagnosis not present

## 2024-10-16 DIAGNOSIS — E538 Deficiency of other specified B group vitamins: Secondary | ICD-10-CM | POA: Diagnosis not present

## 2024-10-16 DIAGNOSIS — Z0000 Encounter for general adult medical examination without abnormal findings: Secondary | ICD-10-CM

## 2024-10-16 DIAGNOSIS — E559 Vitamin D deficiency, unspecified: Secondary | ICD-10-CM | POA: Diagnosis not present

## 2024-10-16 DIAGNOSIS — R509 Fever, unspecified: Secondary | ICD-10-CM | POA: Diagnosis not present

## 2024-10-16 MED ORDER — BENZONATATE 200 MG PO CAPS: 200.0000 mg | ORAL_CAPSULE | Freq: Two times a day (BID) | ORAL | 3 refills | Status: AC | PRN

## 2024-10-16 MED ORDER — ALBUTEROL SULFATE HFA 108 (90 BASE) MCG/ACT IN AERS: 2.0000 | INHALATION_SPRAY | Freq: Four times a day (QID) | RESPIRATORY_TRACT | 0 refills | Status: AC | PRN

## 2024-10-16 NOTE — Patient Instructions (Signed)
 Please call to schedule your mammogram and/or bone density: [facility] at [facility]  Address: [address], [place], [place] [address] Phone: [phone]  [facility] [address] [place],  [place]  [address] Phone: [phone]

## 2024-10-16 NOTE — Progress Notes (Unsigned)
 "  BP 133/67 (BP Location: Right Arm, Patient Position: Sitting, Cuff Size: Large)   Pulse 76   Temp 100.2 F (37.9 C) (Oral)   Ht 5' 2.99 (1.6 m)   LMP 10/09/2024 (Approximate)   SpO2 95%   BMI 49.79 kg/m    Subjective:    Patient ID: Sharon Gibson, female    DOB: [birth year], 44 y.o.   MRN: [id number]  HPI: Sharon Gibson is a 44 y.o. female presenting on 10/16/2024 for comprehensive medical examination. Current medical complaints include:  UPPER RESPIRATORY TRACT INFECTION Duration: 2 days Worst symptom: cough, aching, fever, headache Fever: yes Cough: yes Shortness of breath: no Wheezing: no Chest pain: no Chest tightness: yes Chest congestion: no Nasal congestion: no Runny nose: no Post nasal drip: no Sneezing: no Sore throat: no Swollen glands: no Sinus pressure: yes Headache: yes Face pain: no Toothache: no Ear pain: no  Ear pressure: no  Eyes red/itching:no Eye drainage/crusting: no  Vomiting: no Rash: no Fatigue: yes Sick contacts: no Strep contacts: no  Context: stable Recurrent sinusitis: no Relief with OTC cold/cough medications: no  Treatments attempted: none   HYPOTHYROIDISM- has been off her medicine for a while Thyroid  control status:uncontrolled Satisfied with current treatment? no Medication side effects: no Medication compliance: poor compliance Recent dose adjustment:no Fatigue: yes Cold intolerance: no Heat intolerance: no Weight gain: no Weight loss: no Constipation: no Diarrhea/loose stools: no Palpitations: no Lower extremity edema: no Anxiety/depressed mood: yes  DEPRESSION- has a job through June, but her job is ending. Parents are not doing well. Mom has been sick. Has been following with psychiatry. Has been feeling like her anxiety has been less controlled, but feels like she's doing OK. Still following with counseling.  Mood status: better Satisfied with current treatment?: yes Symptom severity: moderate   Duration of current treatment : chronic Side effects: no Medication compliance: excellent compliance Psychotherapy/counseling: yes current Depressed mood: yes Anxious mood: yes Anhedonia: no Significant weight loss or gain: no Insomnia: no  Fatigue: yes Feelings of worthlessness or guilt: no Impaired concentration/indecisiveness: no Suicidal ideations: no Hopelessness: no Crying spells: no    10/16/2024    4:32 PM 06/29/2024    2:48 PM 06/01/2024    4:21 PM 05/02/2024    4:33 PM 11/24/2023   10:35 AM  Depression screen PHQ 2/9  Decreased Interest 1 3 3 3 2   Down, Depressed, Hopeless 1 3 3 3 2   PHQ - 2 Score 2 6 6 6 4   Altered sleeping 3 3 3 1 3   Tired, decreased energy 2 3 3 3 3   Change in appetite 1 3 3 3 3   Feeling bad or failure about yourself  1 3 3 2 2   Trouble concentrating 0 3 3 3 2   Moving slowly or fidgety/restless 0 3 3 3 3   Suicidal thoughts 1 3 3 3 2   PHQ-9 Score 10 27  27  24  22    Difficult doing work/chores Somewhat difficult Extremely dIfficult Extremely dIfficult Extremely dIfficult Very difficult     Data saved with a previous flowsheet row definition      10/16/2024    4:33 PM 06/29/2024    2:48 PM 06/01/2024    4:21 PM 05/02/2024    4:33 PM  GAD 7 : Generalized Anxiety Score  Nervous, Anxious, on Edge 3 3  3  3    Control/stop worrying 2 3  3  3    Worry too much - different things 1 3  3  3   Trouble relaxing 2 3  3  3    Restless 0 3  3  3    Easily annoyed or irritable 0 3  3  3    Afraid - awful might happen 2 3  3  3    Total GAD 7 Score 10 21 21 21   Anxiety Difficulty Somewhat difficult Extremely difficult Extremely difficult Extremely difficult     Data saved with a previous flowsheet row definition    She currently lives with: parents Menopausal Symptoms: no  Depression Screen done today and results listed below:     10/16/2024    4:32 PM 06/29/2024    2:48 PM 06/01/2024    4:21 PM 05/02/2024    4:33 PM 11/24/2023   10:35 AM  Depression screen  PHQ 2/9  Decreased Interest 1 3 3 3 2   Down, Depressed, Hopeless 1 3 3 3 2   PHQ - 2 Score 2 6 6 6 4   Altered sleeping 3 3 3 1 3   Tired, decreased energy 2 3 3 3 3   Change in appetite 1 3 3 3 3   Feeling bad or failure about yourself  1 3 3 2 2   Trouble concentrating 0 3 3 3 2   Moving slowly or fidgety/restless 0 3 3 3 3   Suicidal thoughts 1 3 3 3 2   PHQ-9 Score 10 27  27  24  22    Difficult doing work/chores Somewhat difficult Extremely dIfficult Extremely dIfficult Extremely dIfficult Very difficult     Data saved with a previous flowsheet row definition    Past Medical History:  Past Medical History:  Diagnosis Date   Anemia    Anxiety    Chicken pox    Depression    Eating disorder    Has had residential treatment and hospitalization previously.   Heart murmur    as a child   History of anemia    RECIEVES IRON  INFUSIONS YEARLY   History of pulmonary embolus (PE) 2002   bilaterally-followed by [facility] hematology-w/u negative per pt   Hypothyroidism    no meds currently   OCD (obsessive compulsive disorder)    PTSD (post-traumatic stress disorder)     Surgical History:  Past Surgical History:  Procedure Laterality Date   CHOLECYSTECTOMY  2003   EXCISION OF ENDOMETRIOMA     GASTRIC BYPASS  2003   LAPAROSCOPIC OVARIAN CYSTECTOMY Left 05/10/2018   Procedure: LAPAROSCOPIC OVARIAN CYSTECTOMY;  Surgeon: Victor Claudell SAUNDERS, MD;  Location: [facility] ORS;  Service: Gynecology;  Laterality: Left;    Medications:  Current Outpatient Medications on File Prior to Visit  Medication Sig   ARIPiprazole (ABILIFY) 2 MG tablet Take 2 mg by mouth at bedtime.   clonazePAM  (KLONOPIN ) 1 MG tablet Take 1 tablet (1 mg total) by mouth daily as needed for anxiety.   desvenlafaxine (PRISTIQ) 100 MG 24 hr tablet Take 100 mg by mouth daily.   gabapentin  (NEURONTIN ) 300 MG capsule TAKE 2 CAPSULES BY MOUTH AT BEDTIME.   lisdexamfetamine (VYVANSE) 30 MG capsule Take 30 mg by mouth  daily.   tiZANidine  (ZANAFLEX ) 4 MG tablet TAKE 1 TABLET BY MOUTH AT BEDTIME AS NEEDED FOR MUSCLE SPASMS.   levothyroxine  (SYNTHROID ) 175 MCG tablet TAKE 1 TABLET BY MOUTH DAILY BEFORE BREAKFAST. (Patient not taking: Reported on 10/16/2024)   mirtazapine  (REMERON ) 15 MG tablet Take 1 tablet (15 mg total) by mouth at bedtime. (Patient not taking: Reported on 10/16/2024)   triamcinolone  cream (KENALOG ) 0.1 % Apply 1 Application topically  2 (two) times daily. (Patient not taking: Reported on 10/16/2024)   Vitamin D , Ergocalciferol , (DRISDOL ) 1.25 MG (50000 UNIT) CAPS capsule Take 1 capsule (50,000 Units total) by mouth every 7 (seven) days. (Patient not taking: Reported on 10/16/2024)   No current facility-administered medications on file prior to visit.    Allergies:  Allergies[1]  Social History:  Social History   Socioeconomic History   Marital status: Single    Spouse name: Not on file   Number of children: 0   Years of education: Not on file   Highest education level: Master's degree (e.g., MA, MS, MEng, MEd, MSW, MBA)  Occupational History   Not on file  Tobacco Use   Smoking status: Never   Smokeless tobacco: Never  Vaping Use   Vaping status: Never Used  Substance and Sexual Activity   Alcohol use: Not Currently    Alcohol/week: 0.0 - 1.0 standard drinks of alcohol    Comment: rare   Drug use: No   Sexual activity: Not Currently  Other Topics Concern   Not on file  Social History Narrative   Not on file   Social Drivers of Health   Tobacco Use: Low Risk (10/16/2024)   Patient History    Smoking Tobacco Use: Never    Smokeless Tobacco Use: Never    Passive Exposure: Not on file  Financial Resource Strain: Not on file  Food Insecurity: Not on file  Transportation Needs: Not on file  Physical Activity: Not on file  Stress: Not on file  Social Connections: Not on file  Intimate Partner Violence: Not on file  Depression (PHQ2-9): Medium Risk  (10/16/2024)   Depression (PHQ2-9)    PHQ-2 Score: 10  Alcohol Screen: Not on file  Housing: Not on file  Utilities: Not on file  Health Literacy: Not on file   Tobacco Use History[2] Social History   Substance and Sexual Activity  Alcohol Use Not Currently   Alcohol/week: 0.0 - 1.0 standard drinks of alcohol   Comment: rare    Family History:  Family History  Problem Relation Age of Onset   Depression Father    Diabetes Father    Anxiety disorder Father    Alzheimer's disease Maternal Grandfather    Alzheimer's disease Maternal Grandmother    Prostate cancer Paternal Grandfather    Lung cancer Paternal Grandmother     Past medical history, surgical history, medications, allergies, family history and social history reviewed with patient today and changes made to appropriate areas of the chart.   Review of Systems  Constitutional:  Positive for fever. Negative for chills, diaphoresis, malaise/fatigue and weight loss.  HENT: Negative.    Eyes: Negative.   Respiratory: Negative.    Cardiovascular: Negative.   Gastrointestinal: Negative.   Genitourinary: Negative.   Musculoskeletal: Negative.   Skin:  Positive for rash. Negative for itching.  Neurological: Negative.   Endo/Heme/Allergies: Negative.   Psychiatric/Behavioral:  Positive for depression. Negative for hallucinations, memory loss, substance abuse and suicidal ideas. The patient is nervous/anxious. The patient does not have insomnia.    All other ROS negative except what is listed above and in the HPI.      Objective:    BP 133/67 (BP Location: Right Arm, Patient Position: Sitting, Cuff Size: Large)   Pulse 76   Temp 100.2 F (37.9 C) (Oral)   Ht 5' 2.99 (1.6 m)   LMP 10/09/2024 (Approximate)   SpO2 95%   BMI 49.79 kg/m   Wt Readings from  Last 3 Encounters:  02/25/24 281 lb (127.5 kg)  09/08/23 279 lb (126.6 kg)  08/23/23 271 lb (122.9 kg)    Physical Exam Vitals and nursing note  reviewed.  Constitutional:      General: She is not in acute distress.    Appearance: Normal appearance. She is not ill-appearing, toxic-appearing or diaphoretic.  HENT:     Head: Normocephalic and atraumatic.     Right Ear: Tympanic membrane, ear canal and external ear normal. There is no impacted cerumen.     Left Ear: Tympanic membrane, ear canal and external ear normal. There is no impacted cerumen.     Nose: Nose normal. No congestion or rhinorrhea.     Mouth/Throat:     Mouth: Mucous membranes are moist.     Pharynx: Oropharynx is clear. No oropharyngeal exudate or posterior oropharyngeal erythema.  Eyes:     General: No scleral icterus.       Right eye: No discharge.        Left eye: No discharge.     Extraocular Movements: Extraocular movements intact.     Conjunctiva/sclera: Conjunctivae normal.     Pupils: Pupils are equal, round, and reactive to light.  Neck:     Vascular: No carotid bruit.  Cardiovascular:     Rate and Rhythm: Normal rate and regular rhythm.     Pulses: Normal pulses.     Heart sounds: No murmur heard.    No friction rub. No gallop.  Pulmonary:     Effort: Pulmonary effort is normal. No respiratory distress.     Breath sounds: Normal breath sounds. No stridor. No wheezing, rhonchi or rales.  Chest:     Chest wall: No tenderness.  Abdominal:     General: Abdomen is flat. Bowel sounds are normal. There is no distension.     Palpations: Abdomen is soft. There is no mass.     Tenderness: There is no abdominal tenderness. There is no right CVA tenderness, left CVA tenderness, guarding or rebound.     Hernia: No hernia is present.  Genitourinary:    Comments: Breast and pelvic exams deferred with shared decision making Musculoskeletal:        General: No swelling, tenderness, deformity or signs of injury.     Cervical back: Normal range of motion and neck supple. No rigidity. No muscular tenderness.     Right lower leg: No edema.     Left lower leg: No  edema.  Lymphadenopathy:     Cervical: No cervical adenopathy.  Skin:    General: Skin is warm and dry.     Capillary Refill: Capillary refill takes less than 2 seconds.     Coloration: Skin is not jaundiced or pale.     Findings: No bruising, erythema, lesion or rash.  Neurological:     General: No focal deficit present.     Mental Status: She is alert and oriented to person, place, and time. Mental status is at baseline.     Cranial Nerves: No cranial nerve deficit.     Sensory: No sensory deficit.     Motor: No weakness.     Coordination: Coordination normal.     Gait: Gait normal.     Deep Tendon Reflexes: Reflexes normal.  Psychiatric:        Mood and Affect: Mood normal.        Behavior: Behavior normal.        Thought Content: Thought content normal.  Judgment: Judgment normal.     Results for orders placed or performed in visit on 05/23/24  Thyroid  Panel With TSH   Collection Time: 05/23/24  3:14 PM  Result Value Ref Range   TSH 6.110 (H) 0.450 - 4.500 uIU/mL   T4, Total 5.9 4.5 - 12.0 ug/dL   T3 Uptake Ratio 20 (L) 24 - 39 %   Free Thyroxine Index 1.2 1.2 - 4.9      Assessment & Plan:   Problem List Items Addressed This Visit       Endocrine   Hypothyroidism - Primary   Relevant Orders   TSH     Other   B12 deficiency (Chronic)   Severe episode of recurrent major depressive disorder, without psychotic features (HCC)   Relevant Medications   desvenlafaxine (PRISTIQ) 100 MG 24 hr tablet   Vitamin D  deficiency   Relevant Orders   VITAMIN D  25 Hydroxy (Vit-D Deficiency, Fractures)   Other Visit Diagnoses       Hepatitis B vaccination status unknown       Relevant Orders   Hepatitis B surface antibody,quantitative     Routine general medical examination at a health care facility       Relevant Orders   CBC with Differential/Platelet   Comprehensive metabolic panel with GFR   Lipid Panel w/o Chol/HDL Ratio   TSH   Bayer DCA Hb A1c Waived    Hepatitis B surface antibody,quantitative   VITAMIN D  25 Hydroxy (Vit-D Deficiency, Fractures)     Acute cough       Relevant Orders   Veritor Flu A/B Waived     Fever, unspecified fever cause       Relevant Orders   POC COVID-19     Iron  deficiency       Relevant Orders   Ferritin   Iron  Binding Cap (TIBC)([facility])        Follow up plan: Return in about 6 weeks (around 11/27/2024).   LABORATORY TESTING:  - Pap smear: up to date  IMMUNIZATIONS:   - Tdap: Tetanus vaccination status reviewed: last tetanus booster within 10 years. - Influenza: Up to date - Prevnar: Up to date - COVID: Up to date - HPV: will get next time she comes in - Shingrix vaccine: N/A  SCREENING: -Mammogram: Ordered today   PATIENT COUNSELING:   Advised to take 1 mg of folate supplement per day if capable of pregnancy.   Sexuality: Discussed sexually transmitted diseases, partner selection, use of condoms, avoidance of unintended pregnancy  and contraceptive alternatives.   Advised to avoid cigarette smoking.  I discussed with the patient that most people either abstain from alcohol or drink within safe limits (<=14/week and <=4 drinks/occasion for males, <=7/weeks and <= 3 drinks/occasion for females) and that the risk for alcohol disorders and other health effects rises proportionally with the number of drinks per week and how often a drinker exceeds daily limits.  Discussed cessation/primary prevention of drug use and availability of treatment for abuse.   Diet: Encouraged to adjust caloric intake to maintain  or achieve ideal body weight, to reduce intake of dietary saturated fat and total fat, to limit sodium intake by avoiding high sodium foods and not adding table salt, and to maintain adequate dietary potassium and calcium preferably from fresh fruits, vegetables, and low-fat dairy products.    stressed the importance of regular exercise  Injury prevention: Discussed safety belts,  safety helmets, smoke detector, smoking near bedding or upholstery.  Dental health: Discussed importance of regular tooth brushing, flossing, and dental visits.    NEXT PREVENTATIVE PHYSICAL DUE IN 1 YEAR. Return in about 6 weeks (around 11/27/2024).                [1] Allergies Allergen Reactions   Penicillins Hives    Has patient had a PCN reaction causing immediate rash, facial/tongue/throat swelling, SOB or lightheadedness with hypotension: yes Has patient had a PCN reaction causing severe rash involving mucus membranes or skin necrosis: no Has patient had a PCN reaction that required hospitalization: yes Has patient had a PCN reaction occurring within the last 10 years: no If all of the above answers are NO, then may proceed with Cephalosporin use.    Strawberry (Diagnostic) Swelling    AND WALNUTS-TONGUE SWELLING   Other Other (See Comments)    Narcotics- Burning in the stomach (whether PO or IV)   Celebrex  [Celecoxib ] Nausea And Vomiting   Nsaids Other (See Comments)   Azithromycin  Nausea Only  [2] Social History Tobacco Use  Smoking Status Never  Smokeless Tobacco Never  "

## 2024-10-17 ENCOUNTER — Ambulatory Visit: Payer: Self-pay | Admitting: Family Medicine

## 2024-10-17 DIAGNOSIS — E039 Hypothyroidism, unspecified: Secondary | ICD-10-CM

## 2024-10-17 LAB — COMPREHENSIVE METABOLIC PANEL WITH GFR
ALT: 10 IU/L (ref 0–32)
AST: 17 IU/L (ref 0–40)
Albumin: 4.3 g/dL (ref 3.9–4.9)
Alkaline Phosphatase: 106 IU/L (ref 41–116)
BUN/Creatinine Ratio: 20 (ref 9–23)
BUN: 14 mg/dL (ref 6–24)
Bilirubin Total: 0.4 mg/dL (ref 0.0–1.2)
CO2: 21 mmol/L (ref 20–29)
Calcium: 9.5 mg/dL (ref 8.7–10.2)
Chloride: 105 mmol/L (ref 96–106)
Creatinine, Ser: 0.7 mg/dL (ref 0.57–1.00)
Globulin, Total: 2.1 g/dL (ref 1.5–4.5)
Glucose: 105 mg/dL — ABNORMAL HIGH (ref 70–99)
Potassium: 3.9 mmol/L (ref 3.5–5.2)
Sodium: 140 mmol/L (ref 134–144)
Total Protein: 6.4 g/dL (ref 6.0–8.5)
eGFR: 109 mL/min/1.73

## 2024-10-17 LAB — LIPID PANEL W/O CHOL/HDL RATIO
Cholesterol, Total: 168 mg/dL (ref 100–199)
HDL: 69 mg/dL
LDL Chol Calc (NIH): 87 mg/dL (ref 0–99)
Triglycerides: 60 mg/dL (ref 0–149)
VLDL Cholesterol Cal: 12 mg/dL (ref 5–40)

## 2024-10-17 LAB — CBC WITH DIFFERENTIAL/PLATELET
Basophils Absolute: 0.1 x10E3/uL (ref 0.0–0.2)
Basos: 1 %
EOS (ABSOLUTE): 0.2 x10E3/uL (ref 0.0–0.4)
Eos: 3 %
Hematocrit: 40.7 % (ref 34.0–46.6)
Hemoglobin: 13.2 g/dL (ref 11.1–15.9)
Immature Grans (Abs): 0 x10E3/uL (ref 0.0–0.1)
Immature Granulocytes: 0 %
Lymphocytes Absolute: 1.8 x10E3/uL (ref 0.7–3.1)
Lymphs: 19 %
MCH: 28.1 pg (ref 26.6–33.0)
MCHC: 32.4 g/dL (ref 31.5–35.7)
MCV: 87 fL (ref 79–97)
Monocytes Absolute: 0.5 x10E3/uL (ref 0.1–0.9)
Monocytes: 6 %
Neutrophils Absolute: 6.6 x10E3/uL (ref 1.4–7.0)
Neutrophils: 71 %
Platelets: 301 x10E3/uL (ref 150–450)
RBC: 4.7 x10E6/uL (ref 3.77–5.28)
RDW: 12.7 % (ref 11.7–15.4)
WBC: 9.3 x10E3/uL (ref 3.4–10.8)

## 2024-10-17 LAB — TSH: TSH: 8.22 u[IU]/mL — ABNORMAL HIGH (ref 0.450–4.500)

## 2024-10-17 LAB — VERITOR FLU A/B WAIVED
Influenza A: NEGATIVE
Influenza B: NEGATIVE

## 2024-10-17 LAB — BAYER DCA HB A1C WAIVED: HB A1C (BAYER DCA - WAIVED): 5.2 % (ref 4.8–5.6)

## 2024-10-17 LAB — VITAMIN D 25 HYDROXY (VIT D DEFICIENCY, FRACTURES): Vit D, 25-Hydroxy: 10.4 ng/mL — ABNORMAL LOW (ref 30.0–100.0)

## 2024-10-17 LAB — HEPATITIS B SURFACE ANTIBODY, QUANTITATIVE: Hepatitis B Surf Ab Quant: <3.5 m[IU]/mL — ABNORMAL LOW

## 2024-10-17 LAB — POC COVID19 BINAXNOW: SARS Coronavirus 2 Ag: NEGATIVE

## 2024-10-17 MED ORDER — LEVOTHYROXINE SODIUM 50 MCG PO TABS: 50.0000 ug | ORAL_TABLET | Freq: Every day | ORAL | 0 refills | Status: AC

## 2024-10-17 MED ORDER — VITAMIN D (ERGOCALCIFEROL) 1.25 MG (50000 UNIT) PO CAPS: 50000.0000 [IU] | ORAL_CAPSULE | ORAL | 1 refills | Status: AC

## 2024-10-17 NOTE — Assessment & Plan Note (Signed)
 Rechecking labs today. Await results. Treat as needed.

## 2024-10-17 NOTE — Assessment & Plan Note (Signed)
 Doing better. Continue to follow with psychiatry. Call with any concerns. Continue to monitor.

## 2024-10-20 ENCOUNTER — Ambulatory Visit: Payer: Self-pay

## 2024-10-20 ENCOUNTER — Ambulatory Visit: Admitting: Nurse Practitioner

## 2024-10-20 ENCOUNTER — Encounter: Payer: Self-pay | Admitting: Nurse Practitioner

## 2024-10-20 ENCOUNTER — Ambulatory Visit: Payer: Self-pay | Admitting: Nurse Practitioner

## 2024-10-20 ENCOUNTER — Ambulatory Visit
Admission: RE | Admit: 2024-10-20 | Discharge: 2024-10-20 | Disposition: A | Attending: Nurse Practitioner | Admitting: Nurse Practitioner

## 2024-10-20 ENCOUNTER — Ambulatory Visit
Admission: RE | Admit: 2024-10-20 | Discharge: 2024-10-20 | Disposition: A | Source: Ambulatory Visit | Attending: Nurse Practitioner | Admitting: Nurse Practitioner

## 2024-10-20 VITALS — BP 121/80 | HR 94 | Temp 99.1°F | Ht 63.0 in

## 2024-10-20 DIAGNOSIS — J40 Bronchitis, not specified as acute or chronic: Secondary | ICD-10-CM | POA: Diagnosis present

## 2024-10-20 MED ORDER — IPRATROPIUM-ALBUTEROL 0.5-2.5 (3) MG/3ML IN SOLN
3.0000 mL | Freq: Once | RESPIRATORY_TRACT | Status: AC
  Administered 2024-10-20: 3 mL via RESPIRATORY_TRACT

## 2024-10-20 MED ORDER — DOXYCYCLINE HYCLATE 100 MG PO TABS: 100.0000 mg | ORAL_TABLET | Freq: Two times a day (BID) | ORAL | 0 refills | Status: AC

## 2024-10-20 MED ORDER — PREDNISONE 20 MG PO TABS: 40.0000 mg | ORAL_TABLET | Freq: Every day | ORAL | 0 refills | Status: AC

## 2024-10-20 NOTE — Assessment & Plan Note (Signed)
 Acute with cough ongoing for one week now and fever. Will obtain chest imaging. Start Doxycycline  100 MG BID and Prednisone  40 MG daily for 5 days. Duoneb provided in office, this did offer benefit. Continue Albuterol  and Tessalon  as needed. Strict ER precautions given. Recommend: - Increased rest - Increasing Fluids - Acetaminophen  / ibuprofen as needed for fever/pain.  - Salt water gargling, chloraseptic spray and throat lozenges - Mucinex.  - Saline sinus flushes or a neti pot.  - Humidifying the air.

## 2024-10-20 NOTE — Progress Notes (Signed)
 "  BP 121/80   Pulse 94   Temp 99.1 F (37.3 C) (Oral)   Ht 5' 3 (1.6 m)   LMP 10/09/2024 (Approximate)   SpO2 95%   BMI 49.78 kg/m    Subjective:    Patient ID: Sharon Gibson, female    DOB: 05-Sep-1981, 44 y.o.   MRN: [id number]  HPI: Sharon Gibson is a 44 y.o. female  Chief Complaint  Patient presents with   Cough    Patient states she has not been feeling better at all since being seen on Monday with Dr. Vicci. States she has been having these symptoms since Saturday. Tested negative for covid and flu at [facility] visit. States she is still having a lot of shortness of breath and wheezing since the visit.    Shortness of Breath   Dizziness   UPPER RESPIRATORY TRACT INFECTION Started with symptoms one week ago. Tested negative for flu and Covid on Monday. Gets very dizzy with coughing fits + has had some stress incontinence with this.   Fever: yes - pretty consistent since Monday Cough: yes Shortness of breath: yes Wheezing: yes Chest pain: no Chest tightness: yes Chest congestion: yes Nasal congestion: no Runny nose: yes Post nasal drip: yes Sneezing: no Sore throat: no Swollen glands: no Sinus pressure: yes Headache: yes Face pain: no Toothache: no Ear pain: none Ear pressure: yes bilateral Eyes red/itching:no Eye drainage/crusting: no  Vomiting: no Rash: no Fatigue: yes Sick contacts: yes Strep contacts: no  Context: worse Recurrent sinusitis: no Relief with OTC cold/cough medications: no  Treatments attempted: Albuterol  and Tessalon , Tylenol  Severe, Ibuprofen    Relevant past medical, surgical, family and social history reviewed and updated as indicated. Interim medical history since our last visit reviewed. Allergies and medications reviewed and updated.  Review of Systems  Constitutional:  Positive for chills, fatigue and fever. Negative for activity change and appetite change.  HENT:  Positive for postnasal drip, rhinorrhea and sinus  pressure. Negative for congestion, ear discharge, ear pain, facial swelling, sinus pain, sneezing, sore throat and voice change.   Respiratory:  Positive for cough, chest tightness, shortness of breath and wheezing.   Cardiovascular:  Negative for chest pain, palpitations and leg swelling.  Gastrointestinal: Negative.   Endocrine: Negative.   Neurological:  Positive for headaches. Negative for dizziness and numbness.  Psychiatric/Behavioral: Negative.      Per HPI unless specifically indicated above     Objective:    BP 121/80   Pulse 94   Temp 99.1 F (37.3 C) (Oral)   Ht 5' 3 (1.6 m)   LMP 10/09/2024 (Approximate)   SpO2 95%   BMI 49.78 kg/m   Wt Readings from Last 3 Encounters:  02/25/24 281 lb (127.5 kg)  09/08/23 279 lb (126.6 kg)  08/23/23 271 lb (122.9 kg)    Physical Exam Vitals and nursing note reviewed.  Constitutional:      General: She is awake. She is not in acute distress.    Appearance: She is well-developed and well-groomed. She is obese. She is ill-appearing. She is not toxic-appearing.  HENT:     Head: Normocephalic.     Right Ear: Hearing, ear canal and external ear normal. A middle ear effusion is present. Tympanic membrane is not injected or perforated.     Left Ear: Hearing, ear canal and external ear normal. A middle ear effusion is present. Tympanic membrane is not injected or perforated.     Nose: Rhinorrhea present. Rhinorrhea is clear.  Right Sinus: No maxillary sinus tenderness or frontal sinus tenderness.     Left Sinus: No maxillary sinus tenderness or frontal sinus tenderness.     Mouth/Throat:     Mouth: Mucous membranes are moist.     Pharynx: Posterior oropharyngeal erythema (mild) and postnasal drip present. No pharyngeal swelling or oropharyngeal exudate.  Eyes:     General: Lids are normal.        Right eye: No discharge.        Left eye: No discharge.     Conjunctiva/sclera: Conjunctivae normal.     Pupils: Pupils are equal,  round, and reactive to light.  Neck:     Thyroid : No thyromegaly.     Vascular: No carotid bruit.  Cardiovascular:     Rate and Rhythm: Normal rate and regular rhythm.     Heart sounds: Normal heart sounds. No murmur heard.    No gallop.  Pulmonary:     Effort: Pulmonary effort is normal. No accessory muscle usage or respiratory distress.     Breath sounds: Wheezing (scattered expiratory wheezes throughout) and rales (upper lobes) present. No decreased breath sounds.     Comments: After Duoneb air entry improved and lung sounds less coarse. Abdominal:     General: Bowel sounds are normal.     Palpations: Abdomen is soft. There is no hepatomegaly or splenomegaly.  Musculoskeletal:     Cervical back: Normal range of motion and neck supple.     Right lower leg: No edema.     Left lower leg: No edema.  Lymphadenopathy:     Head:     Right side of head: Submandibular adenopathy present. No submental, tonsillar, preauricular or posterior auricular adenopathy.     Left side of head: Submandibular adenopathy present. No submental, tonsillar, preauricular or posterior auricular adenopathy.     Cervical: No cervical adenopathy.  Skin:    General: Skin is warm and dry.  Neurological:     Mental Status: She is alert and oriented to person, place, and time.  Psychiatric:        Attention and Perception: Attention normal.        Mood and Affect: Mood normal.        Speech: Speech normal.        Behavior: Behavior normal. Behavior is cooperative.        Thought Content: Thought content normal.    Results for orders placed or performed in visit on 10/16/24  Bayer DCA Hb A1c Waived   Collection Time: 10/16/24  4:30 PM  Result Value Ref Range   HB A1C (BAYER DCA - WAIVED) 5.2 4.8 - 5.6 %  CBC with Differential/Platelet   Collection Time: 10/16/24  4:33 PM  Result Value Ref Range   WBC 9.3 3.4 - 10.8 x10E3/uL   RBC 4.70 3.77 - 5.28 x10E6/uL   Hemoglobin 13.2 11.1 - 15.9 g/dL   Hematocrit  59.2 65.9 - 46.6 %   MCV 87 79 - 97 fL   MCH 28.1 26.6 - 33.0 pg   MCHC 32.4 31.5 - 35.7 g/dL   RDW 87.2 88.2 - 84.5 %   Platelets 301 150 - 450 x10E3/uL   Neutrophils 71 Not Estab. %   Lymphs 19 Not Estab. %   Monocytes 6 Not Estab. %   Eos 3 Not Estab. %   Basos 1 Not Estab. %   Neutrophils Absolute 6.6 1.4 - 7.0 x10E3/uL   Lymphocytes Absolute 1.8 0.7 - 3.1 x10E3/uL  Monocytes Absolute 0.5 0.1 - 0.9 x10E3/uL   EOS (ABSOLUTE) 0.2 0.0 - 0.4 x10E3/uL   Basophils Absolute 0.1 0.0 - 0.2 x10E3/uL   Immature Granulocytes 0 Not Estab. %   Immature Grans (Abs) 0.0 0.0 - 0.1 x10E3/uL  Comprehensive metabolic panel with GFR   Collection Time: 10/16/24  4:33 PM  Result Value Ref Range   Glucose 105 (H) 70 - 99 mg/dL   BUN 14 6 - 24 mg/dL   Creatinine, Ser 9.29 0.57 - 1.00 mg/dL   eGFR 890 >40 fO/fpw/8.26   BUN/Creatinine Ratio 20 9 - 23   Sodium 140 134 - 144 mmol/L   Potassium 3.9 3.5 - 5.2 mmol/L   Chloride 105 96 - 106 mmol/L   CO2 21 20 - 29 mmol/L   Calcium 9.5 8.7 - 10.2 mg/dL   Total Protein 6.4 6.0 - 8.5 g/dL   Albumin 4.3 3.9 - 4.9 g/dL   Globulin, Total 2.1 1.5 - 4.5 g/dL   Bilirubin Total 0.4 0.0 - 1.2 mg/dL   Alkaline Phosphatase 106 41 - 116 IU/L   AST 17 0 - 40 IU/L   ALT 10 0 - 32 IU/L  Lipid Panel w/o Chol/HDL Ratio   Collection Time: 10/16/24  4:33 PM  Result Value Ref Range   Cholesterol, Total 168 100 - 199 mg/dL   Triglycerides 60 0 - 149 mg/dL   HDL 69 >60 mg/dL   VLDL Cholesterol Cal 12 5 - 40 mg/dL   LDL Chol Calc (NIH) 87 0 - 99 mg/dL  TSH   Collection Time: 10/16/24  4:33 PM  Result Value Ref Range   TSH 8.220 (H) 0.450 - 4.500 uIU/mL  Hepatitis B surface antibody,quantitative   Collection Time: 10/16/24  4:33 PM  Result Value Ref Range   Hepatitis B Surf Ab Quant <3.5 (L) Immunity>10 mIU/mL  VITAMIN D  25 Hydroxy (Vit-D Deficiency, Fractures)   Collection Time: 10/16/24  4:33 PM  Result Value Ref Range   Vit D, 25-Hydroxy 10.4 (L) 30.0 - 100.0  ng/mL  Veritor Flu A/B Waived   Collection Time: 10/16/24  4:54 PM  Result Value Ref Range   Influenza A Negative Negative   Influenza B Negative Negative  POC COVID-19   Collection Time: 10/17/24  3:35 PM  Result Value Ref Range   SARS Coronavirus 2 Ag Negative Negative      Assessment & Plan:   Problem List Items Addressed This Visit       Respiratory   Bronchitis - Primary   Acute with cough ongoing for one week now and fever. Will obtain chest imaging. Start Doxycycline  100 MG BID and Prednisone  40 MG daily for 5 days. Duoneb provided in office, this did offer benefit. Continue Albuterol  and Tessalon  as needed. Strict ER precautions given. Recommend: - Increased rest - Increasing Fluids - Acetaminophen  / ibuprofen as needed for fever/pain.  - Salt water gargling, chloraseptic spray and throat lozenges - Mucinex.  - Saline sinus flushes or a neti pot.  - Humidifying the air.       Relevant Orders   DG Chest 2 View     Follow up plan: Return in about 2 weeks (around 11/03/2024) for Lung check.      "

## 2024-10-20 NOTE — Telephone Encounter (Signed)
 FYI Only or Action Required?: FYI only for provider: appointment scheduled on 10/20/24.  Patient was last seen in primary care on 10/16/2024 by Vicci Duwaine SQUIBB, DO.  Called Nurse Triage reporting Cough.  Symptoms began several days ago.  Interventions attempted: Nothing.  Symptoms are: unchanged.  Triage Disposition: See HCP Within 4 Hours (Or PCP Triage)  Patient/caregiver understands and will follow disposition?: Yes   Message from [facility] sent at 10/20/2024  8:19 AM EST  Reason for Triage: Worsening flu symptoms, fever, taking everything prescribed by PCP on Monday. Inhaler/Pearls.  Shortness of breath and wheezing. Got dizzy yesterday going up the stairs. Seeking assistance.   Last temp 100.6, morning Wheezing, sob exertion; using inhaler Reason for Disposition  Wheezing is present  Answer Assessment - Initial Assessment Questions Scheduled 10/20/24 Advised call back or ED/911 if symptoms occur/worsen: severe diff breathing, chest pain > 5 min, faint. Patient verbalized understanding.   1. ONSET: When did the cough begin?      10/16/24 2. SEVERITY: How bad is the cough today?      moderate 3. SPUTUM: Describe the color of your sputum (e.g., none, dry cough; clear, white, yellow, green)     No; dry cough 4. HEMOPTYSIS: Are you coughing up any blood? If Yes, ask: How much? (e.g., flecks, streaks, tablespoons, etc.)     no 5. DIFFICULTY BREATHING: Are you having difficulty breathing? If Yes, ask: How bad is it? (e.g., mild, moderate, severe)      moderate 6. FEVER: Do you have a fever? If Yes, ask: What is your temperature, how was it measured, and when did it start?     Fever, last temp,chills 100.6, denies n/v/d 7. CARDIAC HISTORY: Do you have any history of heart disease? (e.g., heart attack, congestive heart failure)      no 8. LUNG HISTORY: Do you have any history of lung disease?  (e.g., pulmonary embolus, asthma, emphysema)     no 9. PE  RISK FACTORS: Do you have a history of blood clots? (or: recent major surgery, recent prolonged travel, bedridden)   PE 10. OTHER SYMPTOMS: Do you have any other symptoms? (e.g., runny nose, wheezing, chest pain) Runny nose, wheezing  Protocols used: Cough - Acute Non-Productive-A-AH

## 2024-10-20 NOTE — Patient Instructions (Addendum)
 IMAGING LOCATION: [address], [place], [place] [address] [phone]   Cough, Adult A cough helps to clear your throat and lungs. It may be a sign of an illness or another condition. A short-term (acute) cough may last 2-3 weeks. A long-term (chronic) cough may last 8 or more weeks. Many things can cause a cough. They include: Illnesses such as: An infection in your throat or lungs. Asthma or other heart or lung problems. Gastroesophageal reflux. This is when acid comes back up from your stomach. Breathing in things that bother (irritate) your lungs. Allergies. Postnasal drip. This is when mucus runs down the back of your throat. Smoking. Some medicines. Follow these instructions at home: Medicines Take over-the-counter and prescription medicines only as told by your doctor. Talk with your doctor before you take cough medicine (cough suppressants). Eating and drinking Do not drink alcohol. Do not drink caffeine. Drink enough fluid to keep your pee (urine) pale yellow. Lifestyle Stay away from cigarette smoke. Do not smoke or use any products that contain nicotine or tobacco. If you need help quitting, ask your doctor. Stay away from things that make you cough. These may include perfume, candles, cleaning products, or campfire smoke. General instructions  Watch for any changes to your cough. Tell your doctor about them. Always cover your mouth when you cough. If the air is dry in your home, use a cool mist vaporizer or humidifier. If your cough is worse at night, try using extra pillows to raise your head up higher while you sleep. Rest as needed. Contact a doctor if: You have new symptoms. Your symptoms get worse. You cough up pus. You have a fever that does not go away. Your cough does not get better after 2-3 weeks. Cough medicine does not help, and you are not sleeping well. You have pain that gets worse or is not helped with medicine. You are losing  weight and do not know why. You have night sweats. Get help right away if: You cough up blood. You have trouble breathing. Your heart is beating very fast. These symptoms may be an emergency. Get help right away. Call 911. Do not wait to see if the symptoms will go away. Do not drive yourself to the hospital. This information is not intended to replace advice given to you by your health care provider. Make sure you discuss any questions you have with your health care provider. Document Revised: 05/15/2022 Document Reviewed: 05/15/2022 Elsevier Patient Education  2024 [employer].

## 2024-10-20 NOTE — Progress Notes (Signed)
 Contacted via MyChart  Overall reassuring imaging. I would just take the Doxycycline  for now and the steroid. This should get you on the mend.

## 2024-11-06 ENCOUNTER — Ambulatory Visit: Admitting: Family Medicine

## 2024-11-07 ENCOUNTER — Encounter: Admitting: Family Medicine

## 2024-11-30 ENCOUNTER — Ambulatory Visit: Admitting: Family Medicine
